# Patient Record
Sex: Male | Born: 1935 | Race: White | Hispanic: No | Marital: Married | Smoking: Former smoker
Health system: Southern US, Community
[De-identification: ages and names within clinical notes are randomized; demographics above are authoritative.]

## PROBLEM LIST (undated history)

## (undated) DIAGNOSIS — E119 Type 2 diabetes mellitus without complications: Secondary | ICD-10-CM

## (undated) DIAGNOSIS — K219 Gastro-esophageal reflux disease without esophagitis: Secondary | ICD-10-CM

## (undated) DIAGNOSIS — Z8711 Personal history of peptic ulcer disease: Secondary | ICD-10-CM

## (undated) DIAGNOSIS — I493 Ventricular premature depolarization: Secondary | ICD-10-CM

## (undated) DIAGNOSIS — C61 Malignant neoplasm of prostate: Secondary | ICD-10-CM

## (undated) DIAGNOSIS — I10 Essential (primary) hypertension: Secondary | ICD-10-CM

## (undated) DIAGNOSIS — R51 Headache: Secondary | ICD-10-CM

## (undated) DIAGNOSIS — I459 Conduction disorder, unspecified: Secondary | ICD-10-CM

## (undated) DIAGNOSIS — Z8719 Personal history of other diseases of the digestive system: Secondary | ICD-10-CM

## (undated) DIAGNOSIS — M6282 Rhabdomyolysis: Secondary | ICD-10-CM

## (undated) DIAGNOSIS — Z9289 Personal history of other medical treatment: Secondary | ICD-10-CM

## (undated) DIAGNOSIS — R519 Headache, unspecified: Secondary | ICD-10-CM

## (undated) DIAGNOSIS — I6529 Occlusion and stenosis of unspecified carotid artery: Secondary | ICD-10-CM

## (undated) DIAGNOSIS — N400 Enlarged prostate without lower urinary tract symptoms: Secondary | ICD-10-CM

## (undated) DIAGNOSIS — G4700 Insomnia, unspecified: Secondary | ICD-10-CM

## (undated) DIAGNOSIS — I739 Peripheral vascular disease, unspecified: Secondary | ICD-10-CM

## (undated) DIAGNOSIS — I639 Cerebral infarction, unspecified: Secondary | ICD-10-CM

## (undated) DIAGNOSIS — M1990 Unspecified osteoarthritis, unspecified site: Secondary | ICD-10-CM

## (undated) DIAGNOSIS — K859 Acute pancreatitis without necrosis or infection, unspecified: Secondary | ICD-10-CM

## (undated) HISTORY — PX: CATARACT EXTRACTION W/ INTRAOCULAR LENS  IMPLANT, BILATERAL: SHX1307

## (undated) HISTORY — DX: Essential (primary) hypertension: I10

## (undated) HISTORY — PX: ESOPHAGOGASTRODUODENOSCOPY (EGD) WITH ESOPHAGEAL DILATION: SHX5812

## (undated) HISTORY — PX: TONSILLECTOMY: SUR1361

## (undated) HISTORY — DX: Occlusion and stenosis of unspecified carotid artery: I65.29

---

## 1998-04-19 HISTORY — PX: PROSTATE BIOPSY: SHX241

## 1998-09-09 ENCOUNTER — Other Ambulatory Visit: Admission: RE | Admit: 1998-09-09 | Discharge: 1998-09-09 | Payer: Self-pay | Admitting: Urology

## 1998-12-31 ENCOUNTER — Other Ambulatory Visit: Admission: RE | Admit: 1998-12-31 | Discharge: 1998-12-31 | Payer: Self-pay | Admitting: Urology

## 1999-01-22 ENCOUNTER — Encounter: Admission: RE | Admit: 1999-01-22 | Discharge: 1999-04-22 | Payer: Self-pay | Admitting: Radiation Oncology

## 1999-03-20 HISTORY — PX: INSERTION PROSTATE RADIATION SEED: SUR718

## 1999-03-24 ENCOUNTER — Encounter: Payer: Self-pay | Admitting: Urology

## 1999-03-24 ENCOUNTER — Ambulatory Visit (HOSPITAL_BASED_OUTPATIENT_CLINIC_OR_DEPARTMENT_OTHER): Admission: RE | Admit: 1999-03-24 | Discharge: 1999-03-24 | Payer: Self-pay | Admitting: Urology

## 1999-05-04 ENCOUNTER — Encounter: Admission: RE | Admit: 1999-05-04 | Discharge: 1999-08-02 | Payer: Self-pay | Admitting: Radiation Oncology

## 2001-02-03 ENCOUNTER — Ambulatory Visit (HOSPITAL_COMMUNITY): Admission: RE | Admit: 2001-02-03 | Discharge: 2001-02-03 | Payer: Self-pay | Admitting: Gastroenterology

## 2001-02-03 ENCOUNTER — Encounter (INDEPENDENT_AMBULATORY_CARE_PROVIDER_SITE_OTHER): Payer: Self-pay | Admitting: *Deleted

## 2003-09-05 ENCOUNTER — Ambulatory Visit (HOSPITAL_COMMUNITY): Admission: RE | Admit: 2003-09-05 | Discharge: 2003-09-05 | Payer: Self-pay | Admitting: Internal Medicine

## 2003-10-18 ENCOUNTER — Ambulatory Visit (HOSPITAL_COMMUNITY): Admission: RE | Admit: 2003-10-18 | Discharge: 2003-10-18 | Payer: Self-pay | Admitting: Internal Medicine

## 2003-11-04 ENCOUNTER — Ambulatory Visit (HOSPITAL_COMMUNITY): Admission: RE | Admit: 2003-11-04 | Discharge: 2003-11-04 | Payer: Self-pay | Admitting: Internal Medicine

## 2008-12-16 ENCOUNTER — Inpatient Hospital Stay (HOSPITAL_COMMUNITY): Admission: EM | Admit: 2008-12-16 | Discharge: 2008-12-23 | Payer: Self-pay | Admitting: Emergency Medicine

## 2009-05-19 ENCOUNTER — Encounter: Admission: RE | Admit: 2009-05-19 | Discharge: 2009-05-19 | Payer: Self-pay | Admitting: Internal Medicine

## 2010-01-08 ENCOUNTER — Ambulatory Visit (HOSPITAL_COMMUNITY): Admission: RE | Admit: 2010-01-08 | Discharge: 2010-01-08 | Payer: Self-pay | Admitting: Gastroenterology

## 2010-05-22 ENCOUNTER — Other Ambulatory Visit: Payer: Self-pay | Admitting: Dermatology

## 2010-07-24 LAB — CBC
HCT: 39.3 % (ref 39.0–52.0)
HCT: 41 % (ref 39.0–52.0)
Hemoglobin: 13.4 g/dL (ref 13.0–17.0)
Hemoglobin: 14 g/dL (ref 13.0–17.0)
MCHC: 34.1 g/dL (ref 30.0–36.0)
MCHC: 34.2 g/dL (ref 30.0–36.0)
MCV: 94.1 fL (ref 78.0–100.0)
MCV: 94.5 fL (ref 78.0–100.0)
Platelets: 200 10*3/uL (ref 150–400)
Platelets: 212 10*3/uL (ref 150–400)
RBC: 4.16 MIL/uL — ABNORMAL LOW (ref 4.22–5.81)
RBC: 4.35 MIL/uL (ref 4.22–5.81)
RDW: 13.4 % (ref 11.5–15.5)
RDW: 13.5 % (ref 11.5–15.5)
WBC: 15.1 10*3/uL — ABNORMAL HIGH (ref 4.0–10.5)
WBC: 15.5 10*3/uL — ABNORMAL HIGH (ref 4.0–10.5)

## 2010-07-24 LAB — GLUCOSE, CAPILLARY
Glucose-Capillary: 102 mg/dL — ABNORMAL HIGH (ref 70–99)
Glucose-Capillary: 110 mg/dL — ABNORMAL HIGH (ref 70–99)
Glucose-Capillary: 124 mg/dL — ABNORMAL HIGH (ref 70–99)
Glucose-Capillary: 124 mg/dL — ABNORMAL HIGH (ref 70–99)
Glucose-Capillary: 125 mg/dL — ABNORMAL HIGH (ref 70–99)
Glucose-Capillary: 129 mg/dL — ABNORMAL HIGH (ref 70–99)
Glucose-Capillary: 133 mg/dL — ABNORMAL HIGH (ref 70–99)
Glucose-Capillary: 142 mg/dL — ABNORMAL HIGH (ref 70–99)
Glucose-Capillary: 144 mg/dL — ABNORMAL HIGH (ref 70–99)
Glucose-Capillary: 155 mg/dL — ABNORMAL HIGH (ref 70–99)
Glucose-Capillary: 159 mg/dL — ABNORMAL HIGH (ref 70–99)
Glucose-Capillary: 162 mg/dL — ABNORMAL HIGH (ref 70–99)
Glucose-Capillary: 173 mg/dL — ABNORMAL HIGH (ref 70–99)

## 2010-07-24 LAB — COMPREHENSIVE METABOLIC PANEL
ALT: 104 U/L — ABNORMAL HIGH (ref 0–53)
ALT: 149 U/L — ABNORMAL HIGH (ref 0–53)
ALT: 91 U/L — ABNORMAL HIGH (ref 0–53)
AST: 111 U/L — ABNORMAL HIGH (ref 0–37)
AST: 70 U/L — ABNORMAL HIGH (ref 0–37)
AST: 70 U/L — ABNORMAL HIGH (ref 0–37)
Albumin: 2.7 g/dL — ABNORMAL LOW (ref 3.5–5.2)
Albumin: 2.8 g/dL — ABNORMAL LOW (ref 3.5–5.2)
Albumin: 3 g/dL — ABNORMAL LOW (ref 3.5–5.2)
Alkaline Phosphatase: 105 U/L (ref 39–117)
Alkaline Phosphatase: 108 U/L (ref 39–117)
Alkaline Phosphatase: 93 U/L (ref 39–117)
BUN: 10 mg/dL (ref 6–23)
BUN: 11 mg/dL (ref 6–23)
BUN: 9 mg/dL (ref 6–23)
CO2: 24 mEq/L (ref 19–32)
CO2: 26 mEq/L (ref 19–32)
CO2: 29 mEq/L (ref 19–32)
Calcium: 8.2 mg/dL — ABNORMAL LOW (ref 8.4–10.5)
Calcium: 8.3 mg/dL — ABNORMAL LOW (ref 8.4–10.5)
Calcium: 8.5 mg/dL (ref 8.4–10.5)
Chloride: 102 mEq/L (ref 96–112)
Chloride: 103 mEq/L (ref 96–112)
Chloride: 105 mEq/L (ref 96–112)
Creatinine, Ser: 0.95 mg/dL (ref 0.4–1.5)
Creatinine, Ser: 0.98 mg/dL (ref 0.4–1.5)
Creatinine, Ser: 0.98 mg/dL (ref 0.4–1.5)
GFR calc Af Amer: >60 mL/min (ref >60)
GFR calc Af Amer: >60 mL/min (ref >60)
GFR calc Af Amer: >60 mL/min (ref >60)
GFR calc non Af Amer: >60 mL/min (ref >60)
GFR calc non Af Amer: >60 mL/min (ref >60)
GFR calc non Af Amer: >60 mL/min (ref >60)
Glucose, Bld: 102 mg/dL — ABNORMAL HIGH (ref 70–99)
Glucose, Bld: 109 mg/dL — ABNORMAL HIGH (ref 70–99)
Glucose, Bld: 124 mg/dL — ABNORMAL HIGH (ref 70–99)
Potassium: 3.4 mEq/L — ABNORMAL LOW (ref 3.5–5.1)
Potassium: 3.4 mEq/L — ABNORMAL LOW (ref 3.5–5.1)
Potassium: 4 mEq/L (ref 3.5–5.1)
Sodium: 133 mEq/L — ABNORMAL LOW (ref 135–145)
Sodium: 137 mEq/L (ref 135–145)
Sodium: 141 mEq/L (ref 135–145)
Total Bilirubin: 0.7 mg/dL (ref 0.3–1.2)
Total Bilirubin: 1.1 mg/dL (ref 0.3–1.2)
Total Bilirubin: 1.4 mg/dL — ABNORMAL HIGH (ref 0.3–1.2)
Total Protein: 6.4 g/dL (ref 6.0–8.3)
Total Protein: 6.6 g/dL (ref 6.0–8.3)
Total Protein: 6.7 g/dL (ref 6.0–8.3)

## 2010-07-24 LAB — COMPREHENSIVE METABOLIC PANEL WITH GFR
ALT: 89 U/L — ABNORMAL HIGH (ref 0–53)
AST: 57 U/L — ABNORMAL HIGH (ref 0–37)
Albumin: 2.7 g/dL — ABNORMAL LOW (ref 3.5–5.2)
Alkaline Phosphatase: 95 U/L (ref 39–117)
BUN: 8 mg/dL (ref 6–23)
CO2: 27 meq/L (ref 19–32)
Calcium: 8.2 mg/dL — ABNORMAL LOW (ref 8.4–10.5)
Chloride: 105 meq/L (ref 96–112)
Creatinine, Ser: 0.94 mg/dL (ref 0.4–1.5)
GFR calc non Af Amer: >60 mL/min
Glucose, Bld: 112 mg/dL — ABNORMAL HIGH (ref 70–99)
Potassium: 3.5 meq/L (ref 3.5–5.1)
Sodium: 139 meq/L (ref 135–145)
Total Bilirubin: 0.9 mg/dL (ref 0.3–1.2)
Total Protein: 6.7 g/dL (ref 6.0–8.3)

## 2010-07-24 LAB — LIPID PANEL
Cholesterol: 164 mg/dL (ref 0–200)
HDL: 32 mg/dL — ABNORMAL LOW (ref >39)
LDL Cholesterol: 116 mg/dL — ABNORMAL HIGH (ref 0–99)
Total CHOL/HDL Ratio: 5.1 RATIO
Triglycerides: 82 mg/dL (ref <150)
VLDL: 16 mg/dL (ref 0–40)

## 2010-07-24 LAB — AMYLASE
Amylase: 194 U/L — ABNORMAL HIGH (ref 27–131)
Amylase: 545 U/L — ABNORMAL HIGH (ref 27–131)

## 2010-07-24 LAB — LIPASE, BLOOD
Lipase: 137 U/L — ABNORMAL HIGH (ref 11–59)
Lipase: 145 U/L — ABNORMAL HIGH (ref 11–59)
Lipase: 162 U/L — ABNORMAL HIGH (ref 11–59)
Lipase: 347 U/L — ABNORMAL HIGH (ref 11–59)

## 2010-07-25 LAB — BASIC METABOLIC PANEL
BUN: 7 mg/dL (ref 6–23)
CO2: 26 mEq/L (ref 19–32)
CO2: 27 mEq/L (ref 19–32)
Calcium: 8.5 mg/dL (ref 8.4–10.5)
Chloride: 102 mEq/L (ref 96–112)
Creatinine, Ser: 0.99 mg/dL (ref 0.4–1.5)
GFR calc Af Amer: >60 mL/min (ref >60)
GFR calc Af Amer: >60 mL/min (ref >60)
GFR calc non Af Amer: >60 mL/min (ref >60)
Glucose, Bld: 135 mg/dL — ABNORMAL HIGH (ref 70–99)
Glucose, Bld: 190 mg/dL — ABNORMAL HIGH (ref 70–99)
Potassium: 3.8 mEq/L (ref 3.5–5.1)
Potassium: 3.9 mEq/L (ref 3.5–5.1)
Sodium: 139 mEq/L (ref 135–145)
Sodium: 139 mEq/L (ref 135–145)

## 2010-07-25 LAB — HEPATIC FUNCTION PANEL
ALT: 143 U/L — ABNORMAL HIGH (ref 0–53)
AST: 121 U/L — ABNORMAL HIGH (ref 0–37)
AST: 309 U/L — ABNORMAL HIGH (ref 0–37)
Albumin: 3.2 g/dL — ABNORMAL LOW (ref 3.5–5.2)
Albumin: 3.4 g/dL — ABNORMAL LOW (ref 3.5–5.2)
Alkaline Phosphatase: 83 U/L (ref 39–117)
Alkaline Phosphatase: 89 U/L (ref 39–117)
Bilirubin, Direct: 0.2 mg/dL (ref 0.0–0.3)
Bilirubin, Direct: 0.2 mg/dL (ref 0.0–0.3)
Indirect Bilirubin: 0.9 mg/dL (ref 0.3–0.9)
Total Bilirubin: 1.1 mg/dL (ref 0.3–1.2)
Total Bilirubin: 1.1 mg/dL (ref 0.3–1.2)
Total Protein: 6.5 g/dL (ref 6.0–8.3)

## 2010-07-25 LAB — CBC
HCT: 40.9 % (ref 39.0–52.0)
HCT: 43.3 % (ref 39.0–52.0)
Hemoglobin: 14.1 g/dL (ref 13.0–17.0)
Hemoglobin: 14.8 g/dL (ref 13.0–17.0)
MCHC: 34.1 g/dL (ref 30.0–36.0)
MCHC: 34.5 g/dL (ref 30.0–36.0)
MCV: 93.7 fL (ref 78.0–100.0)
Platelets: 231 10*3/uL (ref 150–400)
RBC: 4.37 MIL/uL (ref 4.22–5.81)
RBC: 4.62 MIL/uL (ref 4.22–5.81)
RDW: 13.4 % (ref 11.5–15.5)
RDW: 13.5 % (ref 11.5–15.5)
WBC: 15.5 10*3/uL — ABNORMAL HIGH (ref 4.0–10.5)

## 2010-07-25 LAB — APTT: aPTT: 30 seconds (ref 24–37)

## 2010-07-25 LAB — URINALYSIS, ROUTINE W REFLEX MICROSCOPIC
Bilirubin Urine: NEGATIVE
Glucose, UA: NEGATIVE mg/dL
Hgb urine dipstick: NEGATIVE
Specific Gravity, Urine: 1.027 (ref 1.005–1.030)
Urobilinogen, UA: 1 mg/dL (ref 0.0–1.0)
pH: 7.5 (ref 5.0–8.0)

## 2010-07-25 LAB — LIPID PANEL
Cholesterol: 175 mg/dL (ref 0–200)
HDL: 32 mg/dL — ABNORMAL LOW (ref >39)
LDL Cholesterol: 127 mg/dL — ABNORMAL HIGH (ref 0–99)
Total CHOL/HDL Ratio: 5.5 RATIO
Triglycerides: 78 mg/dL (ref <150)
VLDL: 16 mg/dL (ref 0–40)

## 2010-07-25 LAB — CK TOTAL AND CKMB (NOT AT ARMC)
CK, MB: 3.6 ng/mL (ref 0.3–4.0)
Relative Index: 2.5 (ref 0.0–2.5)
Total CK: 144 U/L (ref 7–232)

## 2010-07-25 LAB — HEMOGLOBIN A1C
Hgb A1c MFr Bld: 7.1 % — ABNORMAL HIGH (ref 4.6–6.1)
Mean Plasma Glucose: 157 mg/dL

## 2010-07-25 LAB — LIPASE, BLOOD
Lipase: 502 U/L — ABNORMAL HIGH (ref 11–59)
Lipase: >2000 U/L — ABNORMAL HIGH (ref 11–59)

## 2010-07-25 LAB — GLUCOSE, CAPILLARY
Glucose-Capillary: 114 mg/dL — ABNORMAL HIGH (ref 70–99)
Glucose-Capillary: 114 mg/dL — ABNORMAL HIGH (ref 70–99)
Glucose-Capillary: 122 mg/dL — ABNORMAL HIGH (ref 70–99)
Glucose-Capillary: 125 mg/dL — ABNORMAL HIGH (ref 70–99)
Glucose-Capillary: 132 mg/dL — ABNORMAL HIGH (ref 70–99)

## 2010-07-25 LAB — URINE CULTURE

## 2010-07-25 LAB — POCT CARDIAC MARKERS: Troponin i, poc: <0.05 ng/mL (ref 0.00–0.09)

## 2010-07-25 LAB — PROTIME-INR
INR: 1 (ref 0.00–1.49)
Prothrombin Time: 13.2 seconds (ref 11.6–15.2)

## 2010-07-25 LAB — TROPONIN I: Troponin I: 0.03 ng/mL (ref 0.00–0.06)

## 2010-09-01 NOTE — Consult Note (Signed)
NAME:  Zachary [name], BOOMHOWER NO.:  [id number]   MEDICAL RECORD NO.:  [id number]          PATIENT TYPE:  INP   LOCATION:  3736                         FACILITY:  [facility]   PHYSICIAN:  Shirley Friar, MDDATE OF BIRTH:  02/12/1922   DATE OF CONSULTATION:  DATE OF DISCHARGE:                                 CONSULTATION   INDICATIONS:  Pancreatitis, elevated LFTs.   HISTORY OF PRESENT ILLNESS:  Mr. Fuston is an 77 year old white male  with acute onset of epigastric pain that started last night at midnight  and has continued all day today.  Pain is severe and sharp and was of  sudden onset.  He denies any associated nausea and vomiting, although he  does report some clear mucus regurgitation.  An ultrasound was done,  which showed normal gallbladder without any gallstones.  His common bile  duct was measured at 8 mm.  The head and body of the pancreas was normal  on ultrasound and tail was obscured by gas.  His lipase was greater than  2000, AST 309, ALT 179, T. bili 1.1, alkaline phosphatase 89.   PAST MEDICAL HISTORY:  1. Type 2 diabetes.  2. History of CVA.  3. Gastroesophageal reflux disease.  4. Prostate cancer.  5. Hyperlipidemia.   MEDICATIONS:  1. Prilosec 20 mg p.o. daily.  2. Aspirin 81 mg p.o. daily.  3. Eye drops (unknown name).   ALLERGIES:  PENICILLIN, METFORMIN.   FAMILY HISTORY:  Noncontributory.   SOCIAL HISTORY:  Denies alcohol or tobacco, married.   REVIEW OF SYSTEMS:  Negative from GI standpoint except as stated above.   PHYSICAL EXAMINATION:  VITAL SIGNS:  Temperature 98, pulse 75, blood  pressure 180/75, O2 sats 95% on room air.  GENERAL:  Alert in no acute distress.  ABDOMEN:  Tender in the epigastric region with guarding, otherwise  nontender, soft, mild distention, positive bowel sounds.   LABORATORY DATA:  White blood count 11.1, hemoglobin 14.8, platelet  count 240.  T. bili 1.1, ALP 89, AST 309, ALT 179, lipase greater than  2000.   IMPRESSION:  An 77 year old white male presenting with severe epigastric  abdominal pain due to acute pancreatitis likely from gallstones.  Question choledocholithiasis versus passage of a common bile duct stone.  Abdominal CT scan recommended and pending.  1. Follow LFTs closely.  2. Supportive care.  3. N.p.o.  4. The patient may need ERCP if there is a stone noted in the common      bile duct or if LFTs continue to rise.      Shirley Friar, MD  Electronically Signed     VCS/MEDQ  D:  12/16/2008  T:  12/17/2008  Job:  [id number]   cc:   Theressa Millard, M.D.

## 2010-09-01 NOTE — H&P (Signed)
NAME:  Zachary [name], CEN NO.:  [id number]   MEDICAL RECORD NO.:  [id number]          PATIENT TYPE:  INP   LOCATION:  3736                         FACILITY:  [facility]   PHYSICIAN:  Zachary [name], MDDATE OF BIRTH:  [birth year]   DATE OF ADMISSION:  12/16/2008  DATE OF DISCHARGE:                              HISTORY & PHYSICAL   PRIMARY CARE PHYSICIAN:  Dr. Theressa Millard.   CHIEF COMPLAINT:  Epigastric pain.   HISTORY OF PRESENT ILLNESS:  Zachary [name] is an 77 year old white male  with history of type 2 diabetes, GERD, history of CVA, and prostate  cancer who reports sudden onset of severe epigastric pain at 12:00 a.m.  this morning.  The patient states pain was so severe he did not think  he would wake up this morning.  The patient denies any associated  fever, nausea, vomiting, diarrhea, chest pain, or shortness of breath.  The patient states pain is constant since this morning, wife is at  bedside upon admission evaluation.   PAST MEDICAL HISTORY:  1. History of prostate cancer, status post seed implant in 2005.  2. Type 2 diabetes diagnosed 2008.  3. GERD.  4. History of CVA.  5. Hypercholesterolemia.  6. Cervical radiculopathy.   MEDICATIONS:  1. Aspirin 81 mg p.o. daily.  2. Prilosec 20 mg p.o. daily.  3. Question name of eye drops taking daily for upcoming cataract      surgery.   ALLERGIES:  PENICILLIN causes rash, METFORMIN causes general malaise.   FAMILY HISTORY:  Reviewed and noncontributory to admit.   SOCIAL HISTORY:  The patient is married.  He is retired.  He has 2  children, 5 grandchildren.  No history of tobacco or EtOH use.   REVIEW OF SYSTEMS:  GENERAL:  Negative for fever and fatigue.  EYES:  Negative for blurred vision or diplopia.  EARS, NOSE, AND THROAT:  Negative for sore throat or lesions.  NECK:  Negative for stiffness or  swelling.  CARDIOVASCULAR:  Negative for chest pain, shortness of  breath, palpitations, or lower  extremity swelling.  RESPIRATORY:  Negative for cough or shortness of breath.  GI: Positive for epigastric  pain, negative for nausea, vomiting, diarrhea, constipation, melena, or  hematochezia.  GU:  Negative for hematuria or dysuria.  SKIN:  Negative  for rash or suspicious lesions.  NEUROLOGIC:  Negative for headache,  dizziness, or focal weakness.   PHYSICAL EXAM:  VITALS:  Blood pressure on arrival 220/86, down to  164/86; heart rate 60; respirations 21; temp 97.7; O2 sat is 94% on room  air.  GENERAL:  This is an elderly white male, awake and alert, in no acute  distress.  HEAD:  Normocephalic, atraumatic.  EYES:  Extraocular movements are intact.  No scleral icterus or  injection.  EARS, NOSE, AND THROAT:  Mucous membranes are moist without any  oropharyngeal lesions.  NECK:  Supple without thyromegaly or lymphadenopathy.  No JVD or carotid  bruits.  CARDIOVASCULAR:  S1 and S2, regular rate and rhythm.  No murmurs, rubs,  or gallops.  No lower extremity edema.  RESPIRATORY:  Lung sounds are  clear to auscultation bilaterally.  No wheezes, rales, or crackles.  No  increased work of breathing.  GI:  Abdomen is soft, positive bowel sounds.  Positive Murphy sign with  no appreciated masses or hepatosplenomegaly.  SKIN:  Without any suspicious rashes or lesions.  NEUROLOGIC:  The patient moves all extremities x4 without any motor or  sensory deficits on exam.  PSYCHOLOGICAL:  The patient is alert, oriented x3 with pleasant mood and  affect.   LABS AND RADIOLOGY:  The patient with mild leukocytosis with white cell  count of 11.1, platelet count 240, hemoglobin 14.8, hematocrit 43.3.  Sodium 139, potassium 3.8, BUN 9, creatinine 0.94.  Cardiac point-of-  care negative x1.  Total bili 1.1, alkaline phosphatase 89, AST 309, ALT  179, lipase greater than 2000.  Chest x-ray with no definite acute  process.  Abdominal ultrasound reveals no gallstones.  Common bile duct  upper limits  of normal to slightly enlarged.  Head and body of pancreas  within normal limits.  Tail is obscured by bowel gas.   EKG, normal sinus rhythm at 61 beats per minute with no ischemic changes  from November 2000.   IMPRESSION AND PLAN:  1. Epigastric pain with elevated transaminases, unclear etiology,      question gallstone pancreatitis versus primary pancreatitis versus      other given results of abdominal ultrasound suspicion for patient      recently passing stone with transient elevated lipase.  We will      admit the patient overnight to telemetry for pain control, we will      ask GI to evaluate, the patient may need surgery consult, if felt      gallbladder contributing to patient's pain, question whether or not      the patient needs a CT of the abdomen to further visualize the      pancreas, we will discuss this with Gastroenterology.  We will      check one set of cardiac enzymes to rule out cardiac etiology of      the patient's pain.  The patient is nontoxic appearing, therefore      we will hold antibiotic treatment at this time for GI consult.  2. Uncontrolled hypertension, likely exacerbated by the patient's      pain.  We will order for p.r.n. labetalol.  3. Type 2 diabetes.  The patient is off oral agent secondary to      intolerance.  We will order for sliding scale insulin while      inpatient, check hemoglobin A1c.  4. Gastroesophageal reflux disease.  We will order for IV PPI in      setting of epigastric pain.  5. History of cerebrovascular accident, stable.  6. Hypercholesterolemia.  No meds prior to arrival.  We will check      fasting lipid profile in a.m. to evaluate      triglycerides in setting of epigastric pain.  7. Prophylaxis.  We will order for SCDs, will avoid aspirin and      Lovenox therapy in case need for surgery.  8. The patient is a do not resuscitate.      Cordelia Pen, NP      Zachary Cowboy, MD  Electronically  Signed    LE/MEDQ  D:  12/16/2008  T:  12/17/2008  Job:  [id number]   cc:   Theressa Millard, M.D.

## 2010-09-04 NOTE — Op Note (Signed)
[facility]  Patient:    Zachary [name]                      MRN: [id number] Proc. Date: 03/24/99 Adm. Date:  [id number] Attending:  Nelma Rothman Iii                           Operative Report  DIAGNOSIS:  Adenocarcinoma of the prostate.  OPERATIVE PROCEDURE:  Transperineal implantation of iodine 125 seeds into the prostate and flexible cystourethroscopy.  SURGEON:  Lucrezia Starch. Ovidio Hanger, M.D.  ASSISTANT:  Wynn Banker, M.D.  ANESTHESIA:  General endotracheal.  ESTIMATED BLOOD LOSS:  15 cc.  TUBES:  A 16 French Foley.  COMPLICATIONS:  None.  SEEDS IMPLANTED:  A total of 106 seeds were implanted with 20 needles at 0.284 Ci per seed and Vicryl strands were utilized.  INDICATIONS FOR SURGERY:  Mr. Bartolomei is a very nice 77 year old white male, who presented originally with high-grade PIN bilaterally.  He was found to have a PSA of 10.81 and subsequently underwent transrectal ultrasound and biopsy of the prostate again which revealed a Gleason score 7 which was 3+4 adenocarcinoma from the left side of the prostate with atypia on the right side.  His metastatic work-up was negative.  He has considered all options and elected to proceed with a combination of external beam therapy and seed implantation.  He has completed his external beam therapy.  He has been properly simulated, properly informed and is now to undergo seed implantation.  DESCRIPTION OF PROCEDURE:  The patient was placed in the supine position. After proper general endotracheal anesthesia, he was placed in the dorsal lithotomy position, prepped and draped with Betadine in a sterile fashion.  A  16 French Foley catheter had been inserted and inflated with 10 cc of contrast solution. The fluoroscopy was placed and the Siemens transrectal ultrasound probe was placed n the [facility] stabilization device and a localized preplanned coordinates.  A reference plane  of 2 cm from the base was utilized for implantation.  Both electronic and physical grid were placed and needles were placed and unused coordinates to facilitate 3 point fixation and serial implantation was performed in the to the prostate.  A total of 106 seeds were implanted with 20 needles at 0.428 mCi per seed and Vicryl strands were utilized.  The seeds appeared to distribute well throughout the prostate gland, both fluoroscopically and by ultrasound. Following implantation the transrectal ultrasound probe was removed as was the ed rubber catheter which had been placed in the rectum to prevent flatus and the wound was dressed sterilely.  Static images were obtained fluoroscopically with and without the ultrasound probe in place.  The patient was placed in the supine position, the Foley catheter was removed and scanned for seeds and there were none within it, and flexible cystourethroscopy was performed with an Olympus flexible cystourethroscope.  There was moderate trilobar hypertrophy, grade 2 trabeculation was noted.  Efflux of clear urine was noted from the normally located bilateral  ureteral orifices and there were no seeds or Vicryl strands noted in the bladder or the urethra.  The flexible cystourethroscope was removed and a new 16 French catheter was inserted, drained and the patient was taken to the recovery room stable. DD:  03/24/99 TD:  03/25/99 Job: [id number]

## 2011-02-04 ENCOUNTER — Other Ambulatory Visit: Payer: Self-pay | Admitting: Physical Medicine and Rehabilitation

## 2011-02-04 DIAGNOSIS — M79601 Pain in right arm: Secondary | ICD-10-CM

## 2011-02-04 DIAGNOSIS — G5610 Other lesions of median nerve, unspecified upper limb: Secondary | ICD-10-CM

## 2011-02-09 ENCOUNTER — Inpatient Hospital Stay: Admission: RE | Admit: 2011-02-09 | Payer: Self-pay | Source: Ambulatory Visit

## 2011-02-16 ENCOUNTER — Ambulatory Visit
Admission: RE | Admit: 2011-02-16 | Discharge: 2011-02-16 | Disposition: A | Discharge status: Home / Self Care | Payer: Medicare Other | Source: Ambulatory Visit | Attending: Physical Medicine and Rehabilitation | Admitting: Physical Medicine and Rehabilitation

## 2011-02-16 DIAGNOSIS — G5610 Other lesions of median nerve, unspecified upper limb: Secondary | ICD-10-CM

## 2011-02-16 DIAGNOSIS — M79601 Pain in right arm: Secondary | ICD-10-CM

## 2011-04-21 DIAGNOSIS — H2000 Unspecified acute and subacute iridocyclitis: Secondary | ICD-10-CM | POA: Diagnosis not present

## 2011-04-21 DIAGNOSIS — Z961 Presence of intraocular lens: Secondary | ICD-10-CM | POA: Diagnosis not present

## 2011-04-21 DIAGNOSIS — H35379 Puckering of macula, unspecified eye: Secondary | ICD-10-CM | POA: Diagnosis not present

## 2011-04-30 DIAGNOSIS — I70219 Atherosclerosis of native arteries of extremities with intermittent claudication, unspecified extremity: Secondary | ICD-10-CM | POA: Diagnosis not present

## 2011-04-30 DIAGNOSIS — I739 Peripheral vascular disease, unspecified: Secondary | ICD-10-CM | POA: Diagnosis not present

## 2011-05-19 DIAGNOSIS — H35379 Puckering of macula, unspecified eye: Secondary | ICD-10-CM | POA: Diagnosis not present

## 2011-06-02 DIAGNOSIS — M25569 Pain in unspecified knee: Secondary | ICD-10-CM | POA: Diagnosis not present

## 2011-06-02 DIAGNOSIS — R5381 Other malaise: Secondary | ICD-10-CM | POA: Diagnosis not present

## 2011-06-02 DIAGNOSIS — I739 Peripheral vascular disease, unspecified: Secondary | ICD-10-CM | POA: Diagnosis not present

## 2011-06-02 DIAGNOSIS — E1159 Type 2 diabetes mellitus with other circulatory complications: Secondary | ICD-10-CM | POA: Diagnosis not present

## 2011-06-16 DIAGNOSIS — Z961 Presence of intraocular lens: Secondary | ICD-10-CM | POA: Diagnosis not present

## 2011-06-16 DIAGNOSIS — H35379 Puckering of macula, unspecified eye: Secondary | ICD-10-CM | POA: Diagnosis not present

## 2011-06-16 DIAGNOSIS — H2000 Unspecified acute and subacute iridocyclitis: Secondary | ICD-10-CM | POA: Diagnosis not present

## 2011-07-14 DIAGNOSIS — R079 Chest pain, unspecified: Secondary | ICD-10-CM | POA: Diagnosis not present

## 2011-07-15 ENCOUNTER — Inpatient Hospital Stay (HOSPITAL_COMMUNITY)
Admission: EM | Admit: 2011-07-15 | Discharge: 2011-07-19 | DRG: 418 | Disposition: A | Discharge status: Home / Self Care | Payer: Medicare Other | Source: Ambulatory Visit | Attending: Internal Medicine | Admitting: Internal Medicine

## 2011-07-15 ENCOUNTER — Emergency Department (HOSPITAL_COMMUNITY): Payer: Medicare Other

## 2011-07-15 ENCOUNTER — Inpatient Hospital Stay (HOSPITAL_COMMUNITY): Payer: Medicare Other

## 2011-07-15 ENCOUNTER — Other Ambulatory Visit: Payer: Self-pay

## 2011-07-15 ENCOUNTER — Encounter (HOSPITAL_COMMUNITY): Payer: Self-pay | Admitting: Internal Medicine

## 2011-07-15 DIAGNOSIS — I739 Peripheral vascular disease, unspecified: Secondary | ICD-10-CM | POA: Diagnosis not present

## 2011-07-15 DIAGNOSIS — G4700 Insomnia, unspecified: Secondary | ICD-10-CM | POA: Diagnosis present

## 2011-07-15 DIAGNOSIS — K8020 Calculus of gallbladder without cholecystitis without obstruction: Secondary | ICD-10-CM | POA: Diagnosis not present

## 2011-07-15 DIAGNOSIS — Z7982 Long term (current) use of aspirin: Secondary | ICD-10-CM

## 2011-07-15 DIAGNOSIS — K810 Acute cholecystitis: Secondary | ICD-10-CM | POA: Diagnosis present

## 2011-07-15 DIAGNOSIS — Z66 Do not resuscitate: Secondary | ICD-10-CM | POA: Diagnosis present

## 2011-07-15 DIAGNOSIS — R079 Chest pain, unspecified: Secondary | ICD-10-CM | POA: Diagnosis not present

## 2011-07-15 DIAGNOSIS — Z8546 Personal history of malignant neoplasm of prostate: Secondary | ICD-10-CM

## 2011-07-15 DIAGNOSIS — Z79899 Other long term (current) drug therapy: Secondary | ICD-10-CM

## 2011-07-15 DIAGNOSIS — D72829 Elevated white blood cell count, unspecified: Secondary | ICD-10-CM | POA: Diagnosis present

## 2011-07-15 DIAGNOSIS — D7289 Other specified disorders of white blood cells: Secondary | ICD-10-CM | POA: Diagnosis not present

## 2011-07-15 DIAGNOSIS — K8000 Calculus of gallbladder with acute cholecystitis without obstruction: Secondary | ICD-10-CM | POA: Diagnosis present

## 2011-07-15 DIAGNOSIS — R1013 Epigastric pain: Secondary | ICD-10-CM | POA: Diagnosis present

## 2011-07-15 DIAGNOSIS — R109 Unspecified abdominal pain: Secondary | ICD-10-CM | POA: Diagnosis not present

## 2011-07-15 DIAGNOSIS — I10 Essential (primary) hypertension: Secondary | ICD-10-CM | POA: Diagnosis present

## 2011-07-15 DIAGNOSIS — K8010 Calculus of gallbladder with chronic cholecystitis without obstruction: Secondary | ICD-10-CM | POA: Diagnosis not present

## 2011-07-15 DIAGNOSIS — Z87891 Personal history of nicotine dependence: Secondary | ICD-10-CM

## 2011-07-15 DIAGNOSIS — N400 Enlarged prostate without lower urinary tract symptoms: Secondary | ICD-10-CM | POA: Diagnosis not present

## 2011-07-15 DIAGNOSIS — R072 Precordial pain: Secondary | ICD-10-CM | POA: Diagnosis not present

## 2011-07-15 HISTORY — DX: Insomnia, unspecified: G47.00

## 2011-07-15 HISTORY — DX: Malignant neoplasm of prostate: C61

## 2011-07-15 HISTORY — DX: Acute pancreatitis without necrosis or infection, unspecified: K85.90

## 2011-07-15 HISTORY — DX: Peripheral vascular disease, unspecified: I73.9

## 2011-07-15 HISTORY — DX: Ventricular premature depolarization: I49.3

## 2011-07-15 HISTORY — DX: Benign prostatic hyperplasia without lower urinary tract symptoms: N40.0

## 2011-07-15 HISTORY — DX: Conduction disorder, unspecified: I45.9

## 2011-07-15 LAB — CBC
MCH: 31.5 pg (ref 26.0–34.0)
MCHC: 34.5 g/dL (ref 30.0–36.0)
MCV: 91.3 fL (ref 78.0–100.0)
MCV: 91.3 fL (ref 78.0–100.0)
Platelets: 265 10*3/uL (ref 150–400)
Platelets: 242 10*3/uL (ref 150–400)
RBC: 4.82 MIL/uL (ref 4.22–5.81)
RDW: 12.7 % (ref 11.5–15.5)
WBC: 17.8 10*3/uL — ABNORMAL HIGH (ref 4.0–10.5)

## 2011-07-15 LAB — COMPREHENSIVE METABOLIC PANEL
AST: 18 U/L (ref 0–37)
Alkaline Phosphatase: 65 U/L (ref 39–117)
BUN: 9 mg/dL (ref 6–23)
CO2: 26 mEq/L (ref 19–32)
Calcium: 9.2 mg/dL (ref 8.4–10.5)
Chloride: 101 mEq/L (ref 96–112)
Creatinine, Ser: 0.82 mg/dL (ref 0.50–1.35)
GFR calc Af Amer: 90 mL/min — ABNORMAL LOW (ref >90)
GFR calc Af Amer: 89 mL/min — ABNORMAL LOW (ref >90)
GFR calc non Af Amer: 77 mL/min — ABNORMAL LOW (ref >90)
GFR calc non Af Amer: 77 mL/min — ABNORMAL LOW (ref >90)
Glucose, Bld: 167 mg/dL — ABNORMAL HIGH (ref 70–99)
Glucose, Bld: 147 mg/dL — ABNORMAL HIGH (ref 70–99)
Potassium: 3.8 mEq/L (ref 3.5–5.1)
Total Bilirubin: 0.6 mg/dL (ref 0.3–1.2)
Total Protein: 7.4 g/dL (ref 6.0–8.3)

## 2011-07-15 LAB — POCT I-STAT, CHEM 8
BUN: 10 mg/dL (ref 6–23)
Chloride: 104 mEq/L (ref 96–112)
HCT: 45 % (ref 39.0–52.0)
Potassium: 3.8 mEq/L (ref 3.5–5.1)

## 2011-07-15 LAB — CARDIAC PANEL(CRET KIN+CKTOT+MB+TROPI)
CK, MB: 2.9 ng/mL (ref 0.3–4.0)
CK, MB: 2.6 ng/mL (ref 0.3–4.0)
Total CK: 168 U/L (ref 7–232)
Troponin I: <0.3 ng/mL (ref <0.30)
Troponin I: <0.3 ng/mL (ref <0.30)
Troponin I: <0.3 ng/mL (ref <0.30)

## 2011-07-15 LAB — MRSA PCR SCREENING: MRSA by PCR: NEGATIVE

## 2011-07-15 LAB — LIPASE, BLOOD: Lipase: 34 U/L (ref 11–59)

## 2011-07-15 LAB — POCT I-STAT TROPONIN I: Troponin i, poc: 0.01 ng/mL (ref 0.00–0.08)

## 2011-07-15 MED ORDER — ZOLPIDEM TARTRATE 5 MG PO TABS
5.0000 mg | ORAL_TABLET | Freq: Every evening | ORAL | Status: DC | PRN
Start: 2011-07-15 — End: 2011-07-19

## 2011-07-15 MED ORDER — ALUM & MAG HYDROXIDE-SIMETH 200-200-20 MG/5ML PO SUSP
30.0000 mL | Freq: Four times a day (QID) | ORAL | Status: DC | PRN
  Administered 2011-07-15: 30 mL via ORAL
  Filled 2011-07-15: qty 30

## 2011-07-15 MED ORDER — SODIUM CHLORIDE 0.9 % IV SOLN: 250.0000 mL | INTRAVENOUS | Status: DC | PRN

## 2011-07-15 MED ORDER — HYDROCORTISONE ACETATE 25 MG RE SUPP
25.0000 mg | Freq: Every day | RECTAL | Status: DC | PRN
  Filled 2011-07-15: qty 1

## 2011-07-15 MED ORDER — ACETAMINOPHEN 325 MG PO TABS
650.0000 mg | ORAL_TABLET | Freq: Four times a day (QID) | ORAL | Status: DC | PRN
  Administered 2011-07-18: 650 mg via ORAL
  Filled 2011-07-15: qty 2

## 2011-07-15 MED ORDER — ALBUTEROL SULFATE (5 MG/ML) 0.5% IN NEBU: 2.5000 mg | INHALATION_SOLUTION | RESPIRATORY_TRACT | Status: DC | PRN

## 2011-07-15 MED ORDER — ONDANSETRON HCL 4 MG/2ML IJ SOLN
4.0000 mg | Freq: Four times a day (QID) | INTRAMUSCULAR | Status: DC | PRN
  Filled 2011-07-15: qty 2

## 2011-07-15 MED ORDER — ONDANSETRON HCL 4 MG/2ML IJ SOLN
INTRAMUSCULAR | Status: AC
  Administered 2011-07-15: 4 mg via INTRAVENOUS
  Filled 2011-07-15: qty 2

## 2011-07-15 MED ORDER — SODIUM CHLORIDE 0.9 % IJ SOLN
3.0000 mL | Freq: Two times a day (BID) | INTRAMUSCULAR | Status: DC
  Administered 2011-07-15 – 2011-07-16 (×2): 3 mL via INTRAVENOUS

## 2011-07-15 MED ORDER — NITROGLYCERIN IN D5W 200-5 MCG/ML-% IV SOLN: 2.0000 ug/min | INTRAVENOUS | Status: DC

## 2011-07-15 MED ORDER — DOCUSATE SODIUM 100 MG PO CAPS
100.0000 mg | ORAL_CAPSULE | Freq: Two times a day (BID) | ORAL | Status: DC
  Administered 2011-07-15 – 2011-07-19 (×8): 100 mg via ORAL
  Filled 2011-07-15 (×10): qty 1

## 2011-07-15 MED ORDER — TAMSULOSIN HCL 0.4 MG PO CAPS
0.4000 mg | ORAL_CAPSULE | Freq: Every day | ORAL | Status: DC
  Administered 2011-07-15 – 2011-07-19 (×2): 0.4 mg via ORAL
  Filled 2011-07-15 (×5): qty 1

## 2011-07-15 MED ORDER — NITROGLYCERIN IN D5W 200-5 MCG/ML-% IV SOLN
5.0000 ug/min | INTRAVENOUS | Status: DC
  Administered 2011-07-15: 5 ug/min via INTRAVENOUS
  Filled 2011-07-15: qty 250

## 2011-07-15 MED ORDER — GUAIFENESIN-DM 100-10 MG/5ML PO SYRP: 5.0000 mL | ORAL_SOLUTION | ORAL | Status: DC | PRN

## 2011-07-15 MED ORDER — FLURAZEPAM HCL 30 MG PO CAPS: 30.0000 mg | ORAL_CAPSULE | Freq: Every evening | ORAL | Status: DC | PRN

## 2011-07-15 MED ORDER — ACETAMINOPHEN 650 MG RE SUPP: 650.0000 mg | Freq: Four times a day (QID) | RECTAL | Status: DC | PRN

## 2011-07-15 MED ORDER — HYDROCODONE-ACETAMINOPHEN 5-325 MG PO TABS
1.0000 | ORAL_TABLET | ORAL | Status: DC | PRN
  Administered 2011-07-18: 2 via ORAL
  Filled 2011-07-15: qty 2

## 2011-07-15 MED ORDER — PENTOXIFYLLINE ER 400 MG PO TBCR
400.0000 mg | EXTENDED_RELEASE_TABLET | Freq: Three times a day (TID) | ORAL | Status: DC
  Administered 2011-07-15 – 2011-07-16 (×5): 400 mg via ORAL
  Filled 2011-07-15 (×10): qty 1

## 2011-07-15 MED ORDER — PANTOPRAZOLE SODIUM 40 MG PO TBEC
40.0000 mg | DELAYED_RELEASE_TABLET | Freq: Every day | ORAL | Status: DC
  Administered 2011-07-16 – 2011-07-18 (×4): 40 mg via ORAL
  Filled 2011-07-15 (×5): qty 1

## 2011-07-15 MED ORDER — ASPIRIN EC 81 MG PO TBEC
81.0000 mg | DELAYED_RELEASE_TABLET | Freq: Every day | ORAL | Status: DC
  Administered 2011-07-15 – 2011-07-16 (×2): 81 mg via ORAL
  Filled 2011-07-15 (×3): qty 1

## 2011-07-15 MED ORDER — ONDANSETRON HCL 4 MG/2ML IJ SOLN
4.0000 mg | Freq: Once | INTRAMUSCULAR | Status: AC
  Administered 2011-07-15: 4 mg via INTRAVENOUS

## 2011-07-15 MED ORDER — SODIUM CHLORIDE 0.9 % IJ SOLN
3.0000 mL | Freq: Two times a day (BID) | INTRAMUSCULAR | Status: DC
  Administered 2011-07-15 – 2011-07-17 (×5): 3 mL via INTRAVENOUS

## 2011-07-15 MED ORDER — CILOSTAZOL 100 MG PO TABS
100.0000 mg | ORAL_TABLET | Freq: Two times a day (BID) | ORAL | Status: DC
  Administered 2011-07-15 – 2011-07-16 (×4): 100 mg via ORAL
  Filled 2011-07-15 (×6): qty 1

## 2011-07-15 MED ORDER — SODIUM CHLORIDE 0.9 % IV SOLN
250.0000 mL | INTRAVENOUS | Status: DC | PRN
  Administered 2011-07-15: 100 mL via INTRAVENOUS
  Administered 2011-07-16: 05:00:00 via INTRAVENOUS

## 2011-07-15 MED ORDER — ONDANSETRON HCL 4 MG PO TABS: 4.0000 mg | ORAL_TABLET | Freq: Four times a day (QID) | ORAL | Status: DC | PRN

## 2011-07-15 MED ORDER — SODIUM CHLORIDE 0.9 % IJ SOLN: 3.0000 mL | INTRAMUSCULAR | Status: DC | PRN

## 2011-07-15 NOTE — ED Notes (Signed)
Pt transported by [employer]

## 2011-07-15 NOTE — H&P (Signed)
PCP:   Darnelle Bos, MD, MD    Chief Complaint:   Epigastric pain  HPI: Zachary [name] is a 78 y.o. male   has a past medical history of Prostate cancer (2000) and PVD (peripheral vascular disease).   Presented with  Gradual onset of epigastric pain starting at 6 pm. Patient was at rest, ate dinner right before it started. associated with  Some nausea, no vomiting, no SOB. He have ahd some recent diarrhea but it went away.  Now the pain has improved dramaticaly. NO fever but occasional chills. He stil feels unwell. When he was first evaluated his SBP was in 210's now down to 170's on nitro gtt. Currently chest/abdominal pain free  Review of Systems:    Pertinent positives include: chills, fatigue, abdominal pain, nausea, vomiting, diarrhea,  Constitutional:  No weight loss, night sweats, Fevers,  weight loss  HEENT:  No headaches, Difficulty swallowing,Tooth/dental problems,Sore throat,  No sneezing, itching, ear ache, nasal congestion, post nasal drip,  Cardio-vascular:  No chest pain, Orthopnea, PND, anasarca, dizziness, palpitations.no Bilateral lower extremity swelling  GI:  No heartburn, indigestion,  change in bowel habits, loss of appetite, melena, blood in stool, hematoemesis Resp:  no shortness of breath at rest. No dyspnea on exertion, No excess mucus, no productive cough, No non-productive cough, No coughing up of blood.No change in color of mucus.No wheezing. Skin:  no rash or lesions. No jaundice GU:  no dysuria, change in color of urine, no urgency or frequency. No straining to urinate.  No flank pain.  Musculoskeletal:  No joint pain or no joint swelling. No decreased range of motion. No back pain.  Psych:  No change in mood or affect. No depression or anxiety. No memory loss.  Neuro: no localizing neurological complaints, no tingling, no weakness, no double vision, no gait abnormality, no slurred speech, no confusion  Otherwise ROS are negative  except for above, 10 systems were reviewed  Past Medical History: Past Medical History  Diagnosis Date  . Prostate cancer 2000  . PVD (peripheral vascular disease)   . Prostate enlargement   . Insomnia   . Pancreatitis   . PVC's (premature ventricular contractions)   . Heart block    History reviewed. No pertinent past surgical history.   Medications: Prior to Admission medications   Medication Sig Start Date End Date Taking? Authorizing Provider  aspirin EC 81 MG tablet Take 81 mg by mouth daily.   Yes Historical Provider, MD  cilostazol (PLETAL) 100 MG tablet Take 100 mg by mouth 2 (two) times daily.   Yes Historical Provider, MD  flurazepam (DALMANE) 30 MG capsule Take 30 mg by mouth at bedtime as needed. sleep   Yes Historical Provider, MD  hydrocortisone (ANUSOL-HC) 25 MG suppository Place 25 mg rectally daily as needed. hemorrhoids   Yes Historical Provider, MD  pentoxifylline (TRENTAL) 400 MG CR tablet Take 400 mg by mouth 3 (three) times daily with meals.   Yes Historical Provider, MD  Tamsulosin HCl (FLOMAX) 0.4 MG CAPS Take 0.4 mg by mouth.   Yes Historical Provider, MD    Allergies:   Allergies  Allergen Reactions  . Penicillins Rash    Social History:  Ambulatory  independently with occasional cane Lives at  home   reports that he has quit smoking. He does not have any smokeless tobacco history on file. He reports that he does not drink alcohol.   Family History: family history includes Breast cancer in his daughter.  Physical Exam: Patient Vitals for the past 24 hrs:  BP Temp Temp src Pulse Resp SpO2 Height Weight  07/15/11 0245 170/75 mmHg - - 95  20  91 % - -  07/15/11 0230 160/73 mmHg - - 97  19  91 % - -  07/15/11 0215 167/87 mmHg - - 91  20  94 % - -  07/15/11 0200 170/87 mmHg - - 93  19  95 % - -  07/15/11 0145 195/87 mmHg - - 92  22  94 % - -  07/15/11 0130 195/75 mmHg - - 89  22  95 % - -  07/15/11 0115 186/85 mmHg - - 88  20  94 % - -    07/15/11 0100 201/83 mmHg - - 82  14  96 % - -  07/15/11 0045 201/89 mmHg - - 81  14  96 % - -  07/15/11 0040 213/92 mmHg - - - 18  94 % - -  07/15/11 0015 206/89 mmHg 98.3 F (36.8 C) Oral - 20  93 % 5\' 10"  (1.778 m) 94.348 kg (208 lb)    1. General:  in No Acute distress 2. Psychological: Alert and  Oriented 3. Head/ENT:   Moist  Mucous Membranes                          Head Non traumatic, neck supple                          Normal  Dentition 4. SKIN: normal  Skin turgor,  Skin clean Dry and intact no rash 5. Heart: Regular rate and rhythm no Murmur, Rub or gallop 6. Lungs: Clear to auscultation bilaterally, no wheezes or crackles   7. Abdomen: Soft, mild epigastric tenderness, Non distended 8. Lower extremities: no clubbing, cyanosis, or edema, difficult to palpate pulses bilateraly 9. Neurologically Grossly intact, moving all 4 extremities equally 10. MSK: Normal range of motion  body mass index is 29.84 kg/(m^2).   Labs on Admission:   [facility] 07/15/11 0049 07/15/11 0025  NA 141 137  K 3.8 3.6  CL 104 100  CO2 -- 26  GLUCOSE 169* 167*  BUN 10 9  CREATININE 0.90 0.82  CALCIUM -- 9.2  MG -- --  PHOS -- --    Basename 07/15/11 0025  AST 18  ALT 13  ALKPHOS 76  BILITOT 0.3  PROT 7.4  ALBUMIN 3.7   No results found for this basename: LIPASE:2,AMYLASE:2 in the last 72 hours  Basename 07/15/11 0049 07/15/11 0025  WBC -- 13.6*  NEUTROABS -- --  HGB 15.3 15.2  HCT 45.0 44.0  MCV -- 91.3  PLT -- 265   No results found for this basename: CKTOTAL:3,CKMB:3,CKMBINDEX:3,TROPONINI:3 in the last 72 hours No results found for this basename: TSH,T4TOTAL,FREET3,T3FREE,THYROIDAB in the last 72 hours No results found for this basename: VITAMINB12:2,FOLATE:2,FERRITIN:2,TIBC:2,IRON:2,RETICCTPCT:2 in the last 72 hours Lab Results  Component Value Date   HGBA1C  Value: 7.1 (NOTE) The ADA recommends the following therapeutic goal for glycemic control related to Hgb A1c  measurement: Goal of therapy: <6.5 Hgb A1c  Reference: American Diabetes Association: Clinical Practice Recommendations 2010, Diabetes Care, 2010, 33: (Suppl  1).* 12/16/2008    Estimated Creatinine Clearance: 66.7 ml/min (by C-G formula based on Cr of 0.9). ABG    Component Value Date/Time   TCO2 26 07/15/2011 0049     No results  found for this basename: DDIMER     Other results:  I have pearsonaly reviewed this: ECG REPORT  Rate: 83   Rhythm: NSR w PVC's  ST&T Change: no ischemia  Cultures:    Component Value Date/Time   SDES URINE, RANDOM 12/17/2008 0353   SPECREQUEST NONE 12/17/2008 0353   CULT NO GROWTH 12/17/2008 0353   REPTSTATUS 12/18/2008 FINAL 12/17/2008 0353       Radiological Exams on Admission: Dg Chest Portable 1 View  07/15/2011  *RADIOLOGY REPORT*  Clinical Data: 78 year old male with chest pain.  PORTABLE CHEST - 1 VIEW  Comparison: 12/04/2010 and prior chest radiographs  Findings: Mild cardiomegaly is again noted. Left basilar atelectasis is now identified. There is no evidence of focal airspace disease, pulmonary edema, suspicious pulmonary nodule/mass, pleural effusion, or pneumothorax. No acute bony abnormalities are identified.  IMPRESSION: Mild cardiomegaly with left basilar atelectasis.  Original Report Authenticated By: Rosendo Gros, M.D.    Assessment/Plan  78 yo M with hx of PVD here with epigastric pain sudden onset associated with nausea and severe HTN  Present on Admission:  .Epigastric abdominal pain/Chest pain at rest - - given risk factors will admit, monitor on telemetry, cycle cardiac enzymes, obtain serial ECG. Further risk stratify with lipid panel, hgA1C, obtain TSH. Make sure patient is on Aspirin. Further treatment based on the currently pending results.  Await on lipase, consider cardiology consult .HTN (hypertension), malignant - continue nitro gtt, he may need to be started on antihypertensive  .Leukocytosis - repeat in AM .PVD  (peripheral vascular disease) - continue home mes    Prophylaxis:  Lovenox  CODE STATUS:DNR/DNI  I have spent a total of 65 min on this admition  Robb Sibal 07/15/2011, 2:42 AM

## 2011-07-15 NOTE — ED Notes (Signed)
Nursing Communication:   Gwenyth Bouillon: [phone] (Cell) - [facility] (Neighbor)  [phone] (Home)  Evans Levee: [phone] [facility])- Wife

## 2011-07-15 NOTE — ED Notes (Signed)
Per EMS, patient is having CP that starts in mid chest pain, staring below nipple. Does not radiate. No SOB or N/V. No hx of HTN, but tonight BP was 242/120.  12 lead shows 1st degree HB and 10-15 PVCS per minute. No hx of either. 2 L  O2 N/C. Last year had pancreatitis. Pain was this bad but does not feel the same.  Pt had 1 Nitro SL and helped. Also, 324 mg PO ASA.  20g R hand. Vitals 195/110, HR 78.

## 2011-07-15 NOTE — ED Provider Notes (Signed)
History     CSN: [id number]  Arrival date & time 07/15/11  0015   First MD Initiated Contact with Patient 07/15/11 0016      Chief Complaint  Patient presents with  . Chest Pain    (Consider location/radiation/quality/duration/timing/severity/associated sxs/prior treatment) Patient is a 78 y.o. male presenting with chest pain. The history is provided by the patient, the spouse and a relative.  Chest Pain The chest pain began 3 - 5 hours ago. Duration of episode(s) is 5 hours. Chest pain occurs constantly. The chest pain is improving. Associated with: Nothing. The pain is currently at 2/10. The severity of the pain is moderate. The quality of the pain is described as aching. The pain does not radiate. Exacerbated by: Nothing. Primary symptoms include nausea. Pertinent negatives for primary symptoms include no fever, no syncope, no shortness of breath, no cough, no abdominal pain, no vomiting and no altered mental status.  Pertinent negatives for associated symptoms include no diaphoresis.    at home tonight and while at rest developed substernal chest pain described as aching. He has associated nausea but no vomiting. No cough or recent illness. He has had on-and-off leg pains but no pain or swelling tonight. History of PE or DVT. No cardiac history. No history of similar symptoms in the past. He states this feels nothing like his previous GERD and heartburn. Received aspirin nitroglycerin route and states pain improving some but still present. Noted to be very hypertensive without history of same. Moderate severity.  No past medical history on file.  No past surgical history on file.  No family history on file.  History  Substance Use Topics  . Smoking status: Not on file  . Smokeless tobacco: Not on file  . Alcohol Use: Not on file      Review of Systems  Constitutional: Negative for fever, chills and diaphoresis.  HENT: Negative for neck pain and neck stiffness.   Eyes:  Negative for pain.  Respiratory: Negative for cough and shortness of breath.   Cardiovascular: Positive for chest pain. Negative for syncope.  Gastrointestinal: Positive for nausea. Negative for vomiting and abdominal pain.  Genitourinary: Negative for dysuria.  Musculoskeletal: Negative for back pain.  Skin: Negative for rash.  Neurological: Negative for headaches.  Psychiatric/Behavioral: Negative for altered mental status.  All other systems reviewed and are negative.    Allergies  Penicillins  Home Medications   Current Outpatient Rx  Name Route Sig Dispense Refill  . ASPIRIN EC 81 MG PO TBEC Oral Take 81 mg by mouth daily.    Marland Kitchen CILOSTAZOL 100 MG PO TABS Oral Take 100 mg by mouth 2 (two) times daily.    Marland Kitchen FLURAZEPAM HCL 30 MG PO CAPS Oral Take 30 mg by mouth at bedtime as needed. sleep    . HYDROCORTISONE ACETATE 25 MG RE SUPP Rectal Place 25 mg rectally daily as needed. hemorrhoids    . PENTOXIFYLLINE ER 400 MG PO TBCR Oral Take 400 mg by mouth 3 (three) times daily with meals.    . TAMSULOSIN HCL 0.4 MG PO CAPS Oral Take 0.4 mg by mouth.      BP 213/92  Temp(Src) 98.3 F (36.8 C) (Oral)  Resp 18  Ht 5\' 10"  (1.778 m)  Wt 208 lb (94.348 kg)  BMI 29.84 kg/m2  SpO2 94%  Physical Exam  Constitutional: He is oriented to person, place, and time. He appears well-developed and well-nourished.  HENT:  Head: Normocephalic and atraumatic.  Eyes: Conjunctivae  and EOM are normal. Pupils are equal, round, and reactive to light.  Neck: Trachea normal. Neck supple. No thyromegaly present.  Cardiovascular: Normal rate, regular rhythm, S1 normal, S2 normal and normal pulses.     No systolic murmur is present   No diastolic murmur is present  Pulses:      Radial pulses are 2+ on the right side, and 2+ on the left side.  Pulmonary/Chest: Effort normal and breath sounds normal. He has no wheezes. He has no rhonchi. He has no rales. He exhibits no tenderness.  Abdominal: Soft. Normal  appearance and bowel sounds are normal. There is no tenderness. There is no CVA tenderness and negative Murphy's sign.  Musculoskeletal:       BLE:s Calves nontender, no cords or erythema, negative Homans sign  Neurological: He is alert and oriented to person, place, and time. He has normal strength. No cranial nerve deficit or sensory deficit. GCS eye subscore is 4. GCS verbal subscore is 5. GCS motor subscore is 6.  Skin: Skin is warm and dry. No rash noted. He is not diaphoretic.  Psychiatric: His speech is normal.       Cooperative and appropriate    ED Course  Procedures (including critical care time)  Results for orders placed during the hospital encounter of 07/15/11  CBC      Component Value Range   WBC 13.6 (*) 4.0 - 10.5 (K/uL)   RBC 4.82  4.22 - 5.81 (MIL/uL)   Hemoglobin 15.2  13.0 - 17.0 (g/dL)   HCT 16.1  09.6 - 04.5 (%)   MCV 91.3  78.0 - 100.0 (fL)   MCH 31.5  26.0 - 34.0 (pg)   MCHC 34.5  30.0 - 36.0 (g/dL)   RDW 40.9  81.1 - 91.4 (%)   Platelets 265  150 - 400 (K/uL)  COMPREHENSIVE METABOLIC PANEL      Component Value Range   Sodium 137  135 - 145 (mEq/L)   Potassium 3.6  3.5 - 5.1 (mEq/L)   Chloride 100  96 - 112 (mEq/L)   CO2 26  19 - 32 (mEq/L)   Glucose, Bld 167 (*) 70 - 99 (mg/dL)   BUN 9  6 - 23 (mg/dL)   Creatinine, Ser 7.82  0.50 - 1.35 (mg/dL)   Calcium 9.2  8.4 - 95.6 (mg/dL)   Total Protein 7.4  6.0 - 8.3 (g/dL)   Albumin 3.7  3.5 - 5.2 (g/dL)   AST 18  0 - 37 (U/L)   ALT 13  0 - 53 (U/L)   Alkaline Phosphatase 76  39 - 117 (U/L)   Total Bilirubin 0.3  0.3 - 1.2 (mg/dL)   GFR calc non Af Amer 77 (*) >90 (mL/min)   GFR calc Af Amer 90 (*) >90 (mL/min)  POCT I-STAT, CHEM 8      Component Value Range   Sodium 141  135 - 145 (mEq/L)   Potassium 3.8  3.5 - 5.1 (mEq/L)   Chloride 104  96 - 112 (mEq/L)   BUN 10  6 - 23 (mg/dL)   Creatinine, Ser 2.13  0.50 - 1.35 (mg/dL)   Glucose, Bld 086 (*) 70 - 99 (mg/dL)   Calcium, Ion 5.78  4.69 - 1.32  (mmol/L)   TCO2 26  0 - 100 (mmol/L)   Hemoglobin 15.3  13.0 - 17.0 (g/dL)   HCT 62.9  52.8 - 41.3 (%)  POCT I-STAT TROPONIN I      Component Value  Range   Troponin i, poc 0.01  0.00 - 0.08 (ng/mL)   Comment 3            Dg Chest Portable 1 View  07/15/2011  *RADIOLOGY REPORT*  Clinical Data: 78 year old male with chest pain.  PORTABLE CHEST - 1 VIEW  Comparison: 12/04/2010 and prior chest radiographs  Findings: Mild cardiomegaly is again noted. Left basilar atelectasis is now identified. There is no evidence of focal airspace disease, pulmonary edema, suspicious pulmonary nodule/mass, pleural effusion, or pneumothorax. No acute bony abnormalities are identified.  IMPRESSION: Mild cardiomegaly with left basilar atelectasis.  Original Report Authenticated By: Rosendo Gros, M.D.    Date: 07/15/2011  Rate: 83   Rhythm: normal sinus rhythm  QRS Axis: normal  Intervals: normal  ST/T Wave abnormalities: nonspecific ST changes  Conduction Disutrbances:none  Narrative Interpretation:   Old EKG Reviewed: unchanged   CRITICAL CARE Performed by: Sanyiah Kanzler   Total critical care time: 40  Critical care time was exclusive of separately billable procedures and treating other patients.  Critical care was necessary to treat or prevent imminent or life-threatening deterioration.  Critical care was time spent personally by me on the following activities: development of treatment plan with patient and/or surrogate as well as nursing, discussions with consultants, evaluation of patient's response to treatment, examination of patient, obtaining history from patient or surrogate, ordering and performing treatments and interventions, ordering and review of laboratory studies, ordering and review of radiographic studies, pulse oximetry and re-evaluation of patient's condition. Family bedside provided additional history. Nitroglycerin IV drip initiated for blood pressure and chest pain. Medicine consult  for admit to step down. Labs, x-ray, EKG obtained and reviewed as above. Continued pain control measures. Repeat iv Zofran for ongoing nausea.  MDM  Chest pain in an 78 year old male with no previous history of heart disease. Concern for ACS. Put on nitro drip for elevated blood pressure and ongoing discomfort. EKG, chest x-ray and labs obtained and reviewed as above. Symptoms improving in the ED but still present. Medicine consult for admission.    1:53 AM case discussed as above with Dr Kara Pacer who agrees to admit step down ICU, NTG drip continued for CP and elevated BP.         Sunnie Nielsen, MD 07/15/11 [phone]

## 2011-07-15 NOTE — Progress Notes (Addendum)
Pts BP has decreased from SBP 170s-150s to 120s. Titrated down nitro gtt gradually down from to . Pt denies chest pain. Still dropping to 94/73 on Nitro gtt. Nitro gtt turned off. MD notified. Sander Remedios, Marzella Schlein

## 2011-07-15 NOTE — Progress Notes (Signed)
Subjective: Still with some nausea from time to time. Severe pain is resolved. Similar but not as sharp as pancreatitis pain he had a few years ago.   Objective:  Vital Signs: Filed Vitals:   07/15/11 0515 07/15/11 0530 07/15/11 0600 07/15/11 0630  BP: 145/70 134/62 132/69 114/63  Pulse: 106 109 112 108  Temp:      TempSrc:      Resp: 21 22 22 21   Height:      Weight:      SpO2: 92% 92% 90% 94%     EXAM: soft, minimal midepig tenderness   Intake/Output Summary (Last 24 hours) at 07/15/11 0750 Last data filed at 07/15/11 0600  Gross per 24 hour  Intake  39.58 ml  Output    300 ml  Net -260.42 ml    Lab Results:  [facility] 07/15/11 0049 07/15/11 0025  NA 141 137  K 3.8 3.6  CL 104 100  CO2 -- 26  GLUCOSE 169* 167*  BUN 10 9  CREATININE 0.90 0.82  CALCIUM -- 9.2  MG -- --  PHOS -- --    Basename 07/15/11 0025  AST 18  ALT 13  ALKPHOS 76  BILITOT 0.3  PROT 7.4  ALBUMIN 3.7    Basename 07/15/11 0256  LIPASE 34  AMYLASE --    Basename 07/15/11 0049 07/15/11 0025  WBC -- 13.6*  NEUTROABS -- --  HGB 15.3 15.2  HCT 45.0 44.0  MCV -- 91.3  PLT -- 265    Basename 07/15/11 0256  CKTOTAL 168  CKMB 4.6*  CKMBINDEX --  TROPONINI <0.30   No components found with this basename: POCBNP:3 No results found for this basename: DDIMER:2 in the last 72 hours No results found for this basename: HGBA1C:2 in the last 72 hours No results found for this basename: CHOL:2,HDL:2,LDLCALC:2,TRIG:2,CHOLHDL:2,LDLDIRECT:2 in the last 72 hours No results found for this basename: TSH,T4TOTAL,FREET3,T3FREE,THYROIDAB in the last 72 hours No results found for this basename: VITAMINB12:2,FOLATE:2,FERRITIN:2,TIBC:2,IRON:2,RETICCTPCT:2 in the last 72 hours  Studies/Results: Dg Chest Portable 1 View  07/15/2011  *RADIOLOGY REPORT*  Clinical Data: 78 year old male with chest pain.  PORTABLE CHEST - 1 VIEW  Comparison: 12/04/2010 and prior chest radiographs  Findings: Mild  cardiomegaly is again noted. Left basilar atelectasis is now identified. There is no evidence of focal airspace disease, pulmonary edema, suspicious pulmonary nodule/mass, pleural effusion, or pneumothorax. No acute bony abnormalities are identified.  IMPRESSION: Mild cardiomegaly with left basilar atelectasis.  Original Report Authenticated By: Rosendo Gros, M.D.   Medications: Scheduled Meds:   . aspirin EC  81 mg Oral Daily  . cilostazol  100 mg Oral BID  . docusate sodium  100 mg Oral BID  . ondansetron  4 mg Intravenous Once  . pentoxifylline  400 mg Oral TID WC  . sodium chloride  3 mL Intravenous Q12H  . sodium chloride  3 mL Intravenous Q12H  . Tamsulosin HCl  0.4 mg Oral Daily   Continuous Infusions:   . nitroGLYCERIN 30 mcg/min (07/15/11 0226)  . nitroGLYCERIN 15 mcg/min (07/15/11 0645)   PRN Meds:.sodium chloride, acetaminophen, acetaminophen, albuterol, alum & mag hydroxide-simeth, guaiFENesin-dextromethorphan, HYDROcodone-acetaminophen, hydrocortisone, ondansetron (ZOFRAN) IV, ondansetron, sodium chloride, zolpidem, DISCONTD: flurazepam  Assessment/Plan: Principal Problem:  *Epigastric abdominal pain - etiology unclear. Will keep in CCU today due to varying BP and pulse. Will not add betablocker yet but will increase IVF to see if that helps pulse. Check GB US today. May go off of telemetry for test.  Active Problems:  HTN (hypertension) - BP is better.   Tachycardia - this seems to be sinus tach. Will add some IVF>   Leukocytosis  PVD (peripheral vascular disease)   LOS: 0 days   OSBORNE,JAMES CHARLES 07/15/2011, 7:50 AM

## 2011-07-16 ENCOUNTER — Inpatient Hospital Stay (HOSPITAL_COMMUNITY): Payer: Medicare Other

## 2011-07-16 DIAGNOSIS — R109 Unspecified abdominal pain: Secondary | ICD-10-CM

## 2011-07-16 LAB — COMPREHENSIVE METABOLIC PANEL
ALT: 12 U/L (ref 0–53)
AST: 16 U/L (ref 0–37)
CO2: 28 mEq/L (ref 19–32)
Chloride: 105 mEq/L (ref 96–112)
Creatinine, Ser: 0.95 mg/dL (ref 0.50–1.35)
GFR calc non Af Amer: 73 mL/min — ABNORMAL LOW (ref >90)
Total Bilirubin: 0.7 mg/dL (ref 0.3–1.2)

## 2011-07-16 LAB — URINE CULTURE

## 2011-07-16 MED ORDER — LEVOFLOXACIN IN D5W 500 MG/100ML IV SOLN: 500.0000 mg | INTRAVENOUS | Status: DC

## 2011-07-16 MED ORDER — MORPHINE SULFATE 4 MG/ML IJ SOLN
3.7000 mg | Freq: Once | INTRAMUSCULAR | Status: AC
  Administered 2011-07-16: 3.7 mg via INTRAVENOUS

## 2011-07-16 MED ORDER — CIPROFLOXACIN IN D5W 400 MG/200ML IV SOLN
400.0000 mg | Freq: Two times a day (BID) | INTRAVENOUS | Status: DC
  Administered 2011-07-16 – 2011-07-19 (×8): 400 mg via INTRAVENOUS
  Filled 2011-07-16 (×8): qty 200

## 2011-07-16 MED ORDER — TECHNETIUM TC 99M MEBROFENIN IV KIT
5.0000 | PACK | Freq: Once | INTRAVENOUS | Status: AC | PRN
  Administered 2011-07-16: 5 via INTRAVENOUS

## 2011-07-16 MED ORDER — AMLODIPINE BESYLATE 2.5 MG PO TABS
2.5000 mg | ORAL_TABLET | Freq: Every day | ORAL | Status: DC
  Administered 2011-07-16 – 2011-07-19 (×4): 2.5 mg via ORAL
  Filled 2011-07-16 (×4): qty 1

## 2011-07-16 NOTE — Progress Notes (Signed)
Subjective: The patient states abdominal pain has resolved.  He states he has been passing gas but no bm.  Objective: Vital signs in last 24 hours: Temp:  [97.8 F (36.6 C)-98.8 F (37.1 C)] 98.1 F (36.7 C) (03/29 0751) Pulse Rate:  [76-110] 76  (03/29 0800) Resp:  [14-22] 18  (03/29 0800) BP: (128-173)/(67-95) 151/75 mmHg (03/29 0800) SpO2:  [91 %-96 %] 91 % (03/29 0800) Weight change:  Last BM Date: 07/14/11  Intake/Output from previous day: 03/28 0701 - 03/29 0700 In: 1686.7 [P.O.:970; I.V.:716.7] Out: 1850 [Urine:1850] Intake/Output this shift: Total I/O In: 560 [P.O.:360; I.V.:200] Out: -   Resp: clear to auscultation bilaterally Cardio: regular rate and rhythm, S1, S2 normal, no murmur, click, rub or gallop GI: soft, non-tender; bowel sounds normal; no masses,  no organomegaly  Lab Results:  Basename 07/15/11 0830 07/15/11 0049 07/15/11 0025  WBC 17.8* -- 13.6*  HGB 14.2 15.3 --  HCT 40.9 45.0 --  PLT 242 -- 265   BMET  Basename 07/15/11 0830 07/15/11 0049 07/15/11 0025  NA 138 141 --  K 3.8 3.8 --  CL 101 104 --  CO2 27 -- 26  GLUCOSE 147* 169* --  BUN 9 10 --  CREATININE 0.83 0.90 --  CALCIUM 9.0 -- 9.2    Studies/Results: US Abdomen Complete  07/15/2011  *RADIOLOGY REPORT*  Clinical Data:  Postprandial midepigastric pain  ABDOMINAL ULTRASOUND COMPLETE  Comparison:  December 16, 2008  Findings:  Gallbladder:  There is layering biliary sludge.  There is also gallbladder wall thickening and pericholecystic fluid.  Negative sonographic Murphy's sign.  Common Bile Duct:  Within normal limits in caliber. Measures 7 mm, unchanged.  Liver: No focal mass lesion identified.  Within normal limits in parenchymal echogenicity. There are several small simple cysts.  IVC:  Appears normal.  Pancreas:  No abnormality identified.  Spleen:  Within normal limits in size and echotexture.  Right kidney:  Normal in size and parenchymal echogenicity.  No evidence of mass or  hydronephrosis. There are multiple simple cysts.  Left kidney:  Normal in size and parenchymal echogenicity.  No evidence of mass or hydronephrosis. There are multiple simple cysts.  Abdominal Aorta:  No aneurysm identified. There is atherosclerotic calcification and irregularity.  IMPRESSION: There is sludge within the gallbladder lumen.  There is also gallbladder wall thickening and pericholecystic fluid which suggest cholecystitis.  Original Report Authenticated By: Brandon Melnick, M.D.   Dg Chest Portable 1 View  07/15/2011  *RADIOLOGY REPORT*  Clinical Data: 78 year old male with chest pain.  PORTABLE CHEST - 1 VIEW  Comparison: 12/04/2010 and prior chest radiographs  Findings: Mild cardiomegaly is again noted. Left basilar atelectasis is now identified. There is no evidence of focal airspace disease, pulmonary edema, suspicious pulmonary nodule/mass, pleural effusion, or pneumothorax. No acute bony abnormalities are identified.  IMPRESSION: Mild cardiomegaly with left basilar atelectasis.  Original Report Authenticated By: Rosendo Gros, M.D.    Medications: I have reviewed the patient's current medications.  Assessment/Plan:  The patients gallbladder does show sludge and pericholecystic fluid consistent with cholecystitis.  Interesting his abdominal pain has resolved and he feels much better but wbc elevated. I will ask surgery to see and have put in a call to their service  LOS: 1 day   [facility] 07/16/2011, 9:29 AM

## 2011-07-16 NOTE — Progress Notes (Signed)
07-16-11 UR completed. Hang Ammon RN BSN  

## 2011-07-16 NOTE — Consult Note (Signed)
Reason for Consult:Possible acute cholecystitis Referring Physician: [name] is an 78 y.[name]. male.  HPI: About 5 days of abdominal pain in the epigastrium only, no Murphy's sign, started after dinner on last Sunday.  + nausea, no vomiting, no fevers or chills, no jaundice.  History of pancreatitis without known etiology.  Past Medical History  Diagnosis Date  . Prostate cancer 2000  . PVD (peripheral vascular disease)   . Prostate enlargement   . Insomnia   . Pancreatitis   . PVC's (premature ventricular contractions)   . Heart block     History reviewed. No pertinent past surgical history.  Family History  Problem Relation Age of Onset  . Breast cancer Daughter     Social History:  reports that he has quit smoking. He does not have any smokeless tobacco history on file. He reports that he does not drink alcohol. His drug history not on file.  Allergies:  Allergies  Allergen Reactions  . Penicillins Rash    Medications: I have reviewed the patient's current medications.  Results for orders placed during the hospital encounter of 07/15/11 (from the past 48 hour(s))  CBC     Status: Abnormal   Collection Time   07/15/11 12:25 AM      Component Value Range Comment   WBC 13.6 (*) 4.0 - 10.5 (K/uL)    RBC 4.82  4.22 - 5.81 (MIL/uL)    Hemoglobin 15.2  13.0 - 17.0 (g/dL)    HCT 78.2  95.6 - 21.3 (%)    MCV 91.3  78.0 - 100.0 (fL)    MCH 31.5  26.0 - 34.0 (pg)    MCHC 34.5  30.0 - 36.0 (g/dL)    RDW 08.6  57.8 - 46.9 (%)    Platelets 265  150 - 400 (K/uL)   COMPREHENSIVE METABOLIC PANEL     Status: Abnormal   Collection Time   07/15/11 12:25 AM      Component Value Range Comment   Sodium 137  135 - 145 (mEq/L)    Potassium 3.6  3.5 - 5.1 (mEq/L)    Chloride 100  96 - 112 (mEq/L)    CO2 26  19 - 32 (mEq/L)    Glucose, Bld 167 (*) 70 - 99 (mg/dL)    BUN 9  6 - 23 (mg/dL)    Creatinine, Ser 6.29  0.50 - 1.35 (mg/dL)    Calcium 9.2  8.4 - 10.5 (mg/dL)      Total Protein 7.4  6.0 - 8.3 (g/dL)    Albumin 3.7  3.5 - 5.2 (g/dL)    AST 18  0 - 37 (U/L)    ALT 13  0 - 53 (U/L)    Alkaline Phosphatase 76  39 - 117 (U/L)    Total Bilirubin 0.3  0.3 - 1.2 (mg/dL)    GFR calc non Af Amer 77 (*) >90 (mL/min)    GFR calc Af Amer 90 (*) >90 (mL/min)   POCT I-STAT TROPONIN I     Status: Normal   Collection Time   07/15/11 12:48 AM      Component Value Range Comment   Troponin i, poc 0.01  0.00 - 0.08 (ng/mL)    Comment 3            POCT I-STAT, CHEM 8     Status: Abnormal   Collection Time   07/15/11 12:49 AM      Component Value Range Comment   Sodium  141  135 - 145 (mEq/L)    Potassium 3.8  3.5 - 5.1 (mEq/L)    Chloride 104  96 - 112 (mEq/L)    BUN 10  6 - 23 (mg/dL)    Creatinine, Ser 7.84  0.50 - 1.35 (mg/dL)    Glucose, Bld 696 (*) 70 - 99 (mg/dL)    Calcium, Ion 2.95  1.12 - 1.32 (mmol/L)    TCO2 26  0 - 100 (mmol/L)    Hemoglobin 15.3  13.0 - 17.0 (g/dL)    HCT 28.4  13.2 - 44.0 (%)   LIPASE, BLOOD     Status: Normal   Collection Time   07/15/11  2:56 AM      Component Value Range Comment   Lipase 34  11 - 59 (U/L)   CARDIAC PANEL(CRET KIN+CKTOT+MB+TROPI)     Status: Abnormal   Collection Time   07/15/11  2:56 AM      Component Value Range Comment   Total CK 168  7 - 232 (U/L)    CK, MB 4.6 (*) 0.3 - 4.0 (ng/mL)    Troponin I <0.30  <0.30 (ng/mL)    Relative Index 2.7 (*) 0.0 - 2.5    MRSA PCR SCREENING     Status: Normal   Collection Time   07/15/11  4:23 AM      Component Value Range Comment   MRSA by PCR NEGATIVE  NEGATIVE    MAGNESIUM     Status: Normal   Collection Time   07/15/11  8:30 AM      Component Value Range Comment   Magnesium 1.8  1.5 - 2.5 (mg/dL)   PHOSPHORUS     Status: Normal   Collection Time   07/15/11  8:30 AM      Component Value Range Comment   Phosphorus 3.4  2.3 - 4.6 (mg/dL)   TSH     Status: Normal   Collection Time   07/15/11  8:30 AM      Component Value Range Comment   TSH 1.772  0.350 -  4.500 (uIU/mL)   COMPREHENSIVE METABOLIC PANEL     Status: Abnormal   Collection Time   07/15/11  8:30 AM      Component Value Range Comment   Sodium 138  135 - 145 (mEq/L)    Potassium 3.8  3.5 - 5.1 (mEq/L)    Chloride 101  96 - 112 (mEq/L)    CO2 27  19 - 32 (mEq/L)    Glucose, Bld 147 (*) 70 - 99 (mg/dL)    BUN 9  6 - 23 (mg/dL)    Creatinine, Ser 1.02  0.50 - 1.35 (mg/dL)    Calcium 9.0  8.4 - 10.5 (mg/dL)    Total Protein 6.6  6.0 - 8.3 (g/dL)    Albumin 3.3 (*) 3.5 - 5.2 (g/dL)    AST 16  0 - 37 (U/L)    ALT 12  0 - 53 (U/L)    Alkaline Phosphatase 65  39 - 117 (U/L)    Total Bilirubin 0.6  0.3 - 1.2 (mg/dL)    GFR calc non Af Amer 77 (*) >90 (mL/min)    GFR calc Af Amer 89 (*) >90 (mL/min)   CBC     Status: Abnormal   Collection Time   07/15/11  8:30 AM      Component Value Range Comment   WBC 17.8 (*) 4.0 - 10.5 (K/uL)    RBC 4.48  4.22 -  5.81 (MIL/uL)    Hemoglobin 14.2  13.0 - 17.0 (g/dL)    HCT 16.1  09.6 - 04.5 (%)    MCV 91.3  78.0 - 100.0 (fL)    MCH 31.7  26.0 - 34.0 (pg)    MCHC 34.7  30.0 - 36.0 (g/dL)    RDW 40.9  81.1 - 91.4 (%)    Platelets 242  150 - 400 (K/uL)   CARDIAC PANEL(CRET KIN+CKTOT+MB+TROPI)     Status: Normal   Collection Time   07/15/11  8:30 AM      Component Value Range Comment   Total CK 115  7 - 232 (U/L)    CK, MB 2.9  0.3 - 4.0 (ng/mL)    Troponin I <0.30  <0.30 (ng/mL)    Relative Index 2.5  0.0 - 2.5    CARDIAC PANEL(CRET KIN+CKTOT+MB+TROPI)     Status: Normal   Collection Time   07/15/11  2:14 PM      Component Value Range Comment   Total CK 102  7 - 232 (U/L)    CK, MB 2.5  0.3 - 4.0 (ng/mL)    Troponin I <0.30  <0.30 (ng/mL)    Relative Index 2.5  0.0 - 2.5    CARDIAC PANEL(CRET KIN+CKTOT+MB+TROPI)     Status: Normal   Collection Time   07/15/11  9:28 PM      Component Value Range Comment   Total CK 133  7 - 232 (U/L) HEMOLYSIS AT THIS LEVEL MAY AFFECT RESULT   CK, MB 2.6  0.3 - 4.0 (ng/mL)    Troponin I <0.30  <0.30  (ng/mL)    Relative Index 2.0  0.0 - 2.5      US Abdomen Complete  07/15/2011  *RADIOLOGY REPORT*  Clinical Data:  Postprandial midepigastric pain  ABDOMINAL ULTRASOUND COMPLETE  Comparison:  December 16, 2008  Findings:  Gallbladder:  There is layering biliary sludge.  There is also gallbladder wall thickening and pericholecystic fluid.  Negative sonographic Murphy's sign.  Common Bile Duct:  Within normal limits in caliber. Measures 7 mm, unchanged.  Liver: No focal mass lesion identified.  Within normal limits in parenchymal echogenicity. There are several small simple cysts.  IVC:  Appears normal.  Pancreas:  No abnormality identified.  Spleen:  Within normal limits in size and echotexture.  Right kidney:  Normal in size and parenchymal echogenicity.  No evidence of mass or hydronephrosis. There are multiple simple cysts.  Left kidney:  Normal in size and parenchymal echogenicity.  No evidence of mass or hydronephrosis. There are multiple simple cysts.  Abdominal Aorta:  No aneurysm identified. There is atherosclerotic calcification and irregularity.  IMPRESSION: There is sludge within the gallbladder lumen.  There is also gallbladder wall thickening and pericholecystic fluid which suggest cholecystitis.  Original Report Authenticated By: Brandon Melnick, M.D.   Dg Chest Portable 1 View  07/15/2011  *RADIOLOGY REPORT*  Clinical Data: 78 year old male with chest pain.  PORTABLE CHEST - 1 VIEW  Comparison: 12/04/2010 and prior chest radiographs  Findings: Mild cardiomegaly is again noted. Left basilar atelectasis is now identified. There is no evidence of focal airspace disease, pulmonary edema, suspicious pulmonary nodule/mass, pleural effusion, or pneumothorax. No acute bony abnormalities are identified.  IMPRESSION: Mild cardiomegaly with left basilar atelectasis.  Original Report Authenticated By: Rosendo Gros, M.D.    Review of Systems  Constitutional: Negative for fever and chills.    Gastrointestinal: Positive for nausea and abdominal pain (epigastric only.).  Skin: Negative.    Blood pressure 162/75, pulse 92, temperature 98.1 F (36.7 C), temperature source Oral, resp. rate 20, height 5\' 10"  (1.778 m), weight 92.9 kg (204 lb 12.9 oz), SpO2 92.00%. Physical Exam  Constitutional: He is oriented to person, place, and time. He appears well-developed and well-nourished.  HENT:  Head: Normocephalic and atraumatic.  Eyes: Conjunctivae and EOM are normal. Pupils are equal, round, and reactive to light.  Neck: Normal range of motion. Neck supple.  Cardiovascular: Normal rate, regular rhythm and normal heart sounds.   Respiratory: Effort normal and breath sounds normal.  GI: Soft. Normal appearance and bowel sounds are normal. He exhibits no shifting dullness, no distension, no pulsatile liver, no abdominal bruit, no ascites, no pulsatile midline mass and no mass. There is no tenderness (no RUQ or epigastric tenderness). There is no rigidity, no guarding and negative Murphy's sign.  Musculoskeletal: Normal range of motion.  Neurological: He is alert and oriented to person, place, and time.  Skin: Skin is warm and dry.  Psychiatric: He has a normal mood and affect. His behavior is normal. Judgment and thought content normal.    Assessment/Plan: Possible acute cholecystitis, but concerns are that the patient has not RUQ pain or tenderness, had a negative sonographic Murphy's sign, and has normal LFTs, However, he has a history of pancreatitis of unknown etiology, and signs of acute cholecystitis on ultrasound.  With these conflicting signs would like to get a HIDA scan to see if the cystic duct is patent.  Will start on Cipro for presumed cholecystitis.  Will try to get HIDA today.  NPO for now.  Patient did have breakfast without symptoms.  Zachary [name],Zachary [name] 07/16/2011, 10:34 AM

## 2011-07-17 LAB — COMPREHENSIVE METABOLIC PANEL
ALT: 12 U/L (ref 0–53)
AST: 15 U/L (ref 0–37)
Albumin: 3.2 g/dL — ABNORMAL LOW (ref 3.5–5.2)
Alkaline Phosphatase: 57 U/L (ref 39–117)
Chloride: 102 mEq/L (ref 96–112)
Potassium: 3.5 mEq/L (ref 3.5–5.1)
Sodium: 138 mEq/L (ref 135–145)
Total Bilirubin: 0.5 mg/dL (ref 0.3–1.2)
Total Protein: 6.6 g/dL (ref 6.0–8.3)

## 2011-07-17 LAB — CBC
MCHC: 34.3 g/dL (ref 30.0–36.0)
Platelets: 258 10*3/uL (ref 150–400)
RDW: 12.6 % (ref 11.5–15.5)
WBC: 12.3 10*3/uL — ABNORMAL HIGH (ref 4.0–10.5)

## 2011-07-17 MED ORDER — BISACODYL 10 MG RE SUPP
10.0000 mg | RECTAL | Status: AC
  Administered 2011-07-17: 10 mg via RECTAL
  Filled 2011-07-17: qty 1

## 2011-07-17 NOTE — Progress Notes (Signed)
Subjective: Abd pain is much better, but has not eaten today. Was expecting to have surgery today, but needs to be off anti-platelet medications first. Will stop these and make npo for surgery possibly tomorrow Objective: Vital signs in last 24 hours: Temp:  [97.6 F (36.4 C)-98.9 F (37.2 C)] 98.3 F (36.8 C) (03/30 0800) Pulse Rate:  [82-86] 86  (03/30 0800) Resp:  [15-18] 18  (03/30 0800) BP: (140-176)/(54-90) 140/90 mmHg (03/30 0800) SpO2:  [92 %-97 %] 92 % (03/30 0408) Last BM Date: 07/14/11  Intake/Output from previous day: 03/29 0701 - 03/30 0700 In: 1320 [P.O.:720; I.V.:200; IV Piggyback:400] Out: -  Intake/Output this shift:    General appearance: alert, cooperative, appears stated age and no distress Resp: clear to auscultation bilaterally Cardio: regular rate and rhythm GI: soft, non-tender; bowel sounds normal; no masses,  no organomegaly  Lab Results:   Basename 07/17/11 0500 07/15/11 0830  WBC 12.3* 17.8*  HGB 13.8 14.2  HCT 40.2 40.9  PLT 258 242   BMET  Basename 07/17/11 0500 07/16/11 1005  NA 138 141  K 3.5 4.2  CL 102 105  CO2 26 28  GLUCOSE 143* 172*  BUN 12 12  CREATININE 0.89 0.95  CALCIUM 9.0 9.1   PT/INR No results found for this basename: LABPROT:2,INR:2 in the last 72 hours ABG No results found for this basename: PHART:2,PCO2:2,PO2:2,HCO3:2 in the last 72 hours  Studies/Results: Nm Hepatobiliary  07/16/2011  *RADIOLOGY REPORT*  Clinical Data:  Abdominal pain and evaluate for cholecystitis.  NUCLEAR MEDICINE HEPATOBILIARY IMAGING  Technique:  Sequential images of the abdomen were obtained out to 60 minutes following intravenous administration of radiopharmaceutical. 3.7 mg of morphine was given at 60 minutes. Additional images were obtained up to 90 minutes after radiopharmaceutical injection.  Radiopharmaceutical:  Tc-51m Choletec  Comparison:  Ultrasound 07/15/2011 and abdominal CT 12/16/2008  Findings: Radiopharmaceutical was  taken up by the liver and excreted to the biliary system and small bowel.  There appears be some accumulation of the radiopharmaceutical in the duodenal bulb. There is no clear uptake within the gallbladder even after the administration of morphine.  IMPRESSION: No evidence for gallbladder filling.  Findings are suggestive for an occluded cystic duct and concerning for cholecystitis.  Original Report Authenticated By: Richarda Overlie, M.D.   US Abdomen Complete  07/15/2011  *RADIOLOGY REPORT*  Clinical Data:  Postprandial midepigastric pain  ABDOMINAL ULTRASOUND COMPLETE  Comparison:  December 16, 2008  Findings:  Gallbladder:  There is layering biliary sludge.  There is also gallbladder wall thickening and pericholecystic fluid.  Negative sonographic Murphy's sign.  Common Bile Duct:  Within normal limits in caliber. Measures 7 mm, unchanged.  Liver: No focal mass lesion identified.  Within normal limits in parenchymal echogenicity. There are several small simple cysts.  IVC:  Appears normal.  Pancreas:  No abnormality identified.  Spleen:  Within normal limits in size and echotexture.  Right kidney:  Normal in size and parenchymal echogenicity.  No evidence of mass or hydronephrosis. There are multiple simple cysts.  Left kidney:  Normal in size and parenchymal echogenicity.  No evidence of mass or hydronephrosis. There are multiple simple cysts.  Abdominal Aorta:  No aneurysm identified. There is atherosclerotic calcification and irregularity.  IMPRESSION: There is sludge within the gallbladder lumen.  There is also gallbladder wall thickening and pericholecystic fluid which suggest cholecystitis.  Original Report Authenticated By: Brandon Melnick, M.D.    Anti-infectives: Anti-infectives     Start  Dose/Rate Route Frequency Ordered Stop   07/16/11 1100   ciprofloxacin (CIPRO) IVPB 400 mg        400 mg 200 mL/hr over 60 Minutes Intravenous Every 12 hours 07/16/11 1033     07/16/11 1100   levofloxacin  (LEVAQUIN) IVPB 500 mg  Status:  Discontinued        500 mg 100 mL/hr over 60 Minutes Intravenous Every 24 hours 07/16/11 1045 07/16/11 1048          Assessment/Plan: Acute cholecystitis-  Will allow pt to eat today and plan surgery tomorrow after antiplatelet medicattions have been off for a day. Continue Cipro  LOS: 2 days   Dewey Neukam,PA-C Pager [phone] General Trauma Pager [phone]

## 2011-07-17 NOTE — Progress Notes (Signed)
Subjective: Mr. Ducharme is feeling well. HIDA scan yesterday revealed an occluded cystic duct. He is going for cholecystectomy today. No complaints. Blood pressure relatively well controlled  Objective: Weight change:   Intake/Output Summary (Last 24 hours) at 07/17/11 0804 Last data filed at 07/16/11 2300  Gross per 24 hour  Intake   1220 ml  Output      0 ml  Net   1220 ml   Filed Vitals:   07/16/11 2310 07/16/11 2333 07/17/11 0340 07/17/11 0408  BP: 171/74  149/54   Pulse:      Temp:  98.9 F (37.2 C)  98.2 F (36.8 C)  TempSrc:  Oral  Oral  Resp:      Height:      Weight:      SpO2: 93%   92%    General Appearance: Alert, cooperative, no distress, appears stated age Head: Normocephalic, without obvious abnormality, atraumatic Neck: Supple, symmetrical Lungs: Clear to auscultation bilaterally, respirations unlabored Heart: Regular rate and rhythm, S1 and S2 normal, no murmur, rub or gallop Abdomen: Soft, non-tender, bowel sounds active all four quadrants, no masses, no organomegaly Extremities: Extremities normal, atraumatic, no cyanosis or edema Pulses: 2+ and symmetric all extremities Skin: Skin color, texture, turgor normal, no rashes or lesions Neuro: CNII-XII intact. Nonfocal   Lab Results:  Basename 07/17/11 0500 07/16/11 1005 07/15/11 0830  NA 138 141 --  K 3.5 4.2 --  CL 102 105 --  CO2 26 28 --  GLUCOSE 143* 172* --  BUN 12 12 --  CREATININE 0.89 0.95 --  CALCIUM 9.0 9.1 --  MG -- -- 1.8  PHOS -- -- 3.4    Basename 07/17/11 0500 07/16/11 1005  AST 15 16  ALT 12 12  ALKPHOS 57 61  BILITOT 0.5 0.7  PROT 6.6 6.9  ALBUMIN 3.2* 3.2*    Basename 07/15/11 0256  LIPASE 34  AMYLASE --    Basename 07/17/11 0500 07/15/11 0830  WBC 12.3* 17.8*  NEUTROABS -- --  HGB 13.8 14.2  HCT 40.2 40.9  MCV 92.2 91.3  PLT 258 242    Basename 07/15/11 2128 07/15/11 1414 07/15/11 0830  CKTOTAL 133 102 115  CKMB 2.6 2.5 2.9  CKMBINDEX -- -- --    TROPONINI <0.30 <0.30 <0.30   No components found with this basename: POCBNP:3 No results found for this basename: DDIMER:2 in the last 72 hours No results found for this basename: HGBA1C:2 in the last 72 hours No results found for this basename: CHOL:2,HDL:2,LDLCALC:2,TRIG:2,CHOLHDL:2,LDLDIRECT:2 in the last 72 hours  Basename 07/15/11 0830  TSH 1.772  T4TOTAL --  T3FREE --  THYROIDAB --   No results found for this basename: VITAMINB12:2,FOLATE:2,FERRITIN:2,TIBC:2,IRON:2,RETICCTPCT:2 in the last 72 hours  Studies/Results: Nm Hepatobiliary  07/16/2011  *RADIOLOGY REPORT*  Clinical Data:  Abdominal pain and evaluate for cholecystitis.  NUCLEAR MEDICINE HEPATOBILIARY IMAGING  Technique:  Sequential images of the abdomen were obtained out to 60 minutes following intravenous administration of radiopharmaceutical. 3.7 mg of morphine was given at 60 minutes. Additional images were obtained up to 90 minutes after radiopharmaceutical injection.  Radiopharmaceutical:  Tc-13m Choletec  Comparison:  Ultrasound 07/15/2011 and abdominal CT 12/16/2008  Findings: Radiopharmaceutical was taken up by the liver and excreted to the biliary system and small bowel.  There appears be some accumulation of the radiopharmaceutical in the duodenal bulb. There is no clear uptake within the gallbladder even after the administration of morphine.  IMPRESSION: No evidence for gallbladder filling.  Findings are suggestive for an occluded cystic duct and concerning for cholecystitis.  Original Report Authenticated By: Richarda Overlie, M.D.   US Abdomen Complete  07/15/2011  *RADIOLOGY REPORT*  Clinical Data:  Postprandial midepigastric pain  ABDOMINAL ULTRASOUND COMPLETE  Comparison:  December 16, 2008  Findings:  Gallbladder:  There is layering biliary sludge.  There is also gallbladder wall thickening and pericholecystic fluid.  Negative sonographic Murphy's sign.  Common Bile Duct:  Within normal limits in caliber. Measures 7  mm, unchanged.  Liver: No focal mass lesion identified.  Within normal limits in parenchymal echogenicity. There are several small simple cysts.  IVC:  Appears normal.  Pancreas:  No abnormality identified.  Spleen:  Within normal limits in size and echotexture.  Right kidney:  Normal in size and parenchymal echogenicity.  No evidence of mass or hydronephrosis. There are multiple simple cysts.  Left kidney:  Normal in size and parenchymal echogenicity.  No evidence of mass or hydronephrosis. There are multiple simple cysts.  Abdominal Aorta:  No aneurysm identified. There is atherosclerotic calcification and irregularity.  IMPRESSION: There is sludge within the gallbladder lumen.  There is also gallbladder wall thickening and pericholecystic fluid which suggest cholecystitis.  Original Report Authenticated By: Brandon Melnick, M.D.   Medications: Scheduled Meds:   . amLODipine  2.5 mg Oral Daily  . aspirin EC  81 mg Oral Daily  . cilostazol  100 mg Oral BID  . ciprofloxacin  400 mg Intravenous Q12H  . docusate sodium  100 mg Oral BID  .  morphine injection  3.7 mg Intravenous Once  . pantoprazole  40 mg Oral Q1200  . pentoxifylline  400 mg Oral TID WC  . sodium chloride  3 mL Intravenous Q12H  . Tamsulosin HCl  0.4 mg Oral Daily  . DISCONTD: levofloxacin (LEVAQUIN) IV  500 mg Intravenous Q24H  . DISCONTD: sodium chloride  3 mL Intravenous Q12H   Continuous Infusions:   . nitroGLYCERIN Stopped (07/15/11 0800)  . DISCONTD: nitroGLYCERIN 30 mcg/min (07/15/11 0226)   PRN Meds:.sodium chloride, acetaminophen, acetaminophen, albuterol, alum & mag hydroxide-simeth, guaiFENesin-dextromethorphan, HYDROcodone-acetaminophen, hydrocortisone, ondansetron (ZOFRAN) IV, ondansetron, technetium TC 80M mebrofenin, zolpidem, DISCONTD: sodium chloride  Assessment/Plan:  Patient Active Problem List  Diagnoses Date Noted  . Hypertension - systolic borderline elevated, but okay  07/15/2011  . Leukocytosis -  minimally elevated  07/15/2011  . Epigastric abdominal pain - as occluded cystic duct - for cholecystectomy today as per surgery  07/15/2011  . PVD (peripheral vascular disease)      LOS: 2 days   Samik Balkcom NEVILL 07/17/2011, 8:04 AM

## 2011-07-18 ENCOUNTER — Encounter (HOSPITAL_COMMUNITY): Payer: Self-pay | Admitting: Anesthesiology

## 2011-07-18 ENCOUNTER — Encounter (HOSPITAL_COMMUNITY)
Admission: EM | Disposition: A | Discharge status: Home / Self Care | Payer: Self-pay | Source: Ambulatory Visit | Attending: Internal Medicine

## 2011-07-18 ENCOUNTER — Inpatient Hospital Stay (HOSPITAL_COMMUNITY): Payer: Medicare Other | Admitting: Anesthesiology

## 2011-07-18 DIAGNOSIS — K8010 Calculus of gallbladder with chronic cholecystitis without obstruction: Secondary | ICD-10-CM

## 2011-07-18 HISTORY — PX: CHOLECYSTECTOMY: SHX55

## 2011-07-18 SURGERY — LAPAROSCOPIC CHOLECYSTECTOMY
Anesthesia: General | Site: Abdomen | Wound class: Clean Contaminated

## 2011-07-18 MED ORDER — LABETALOL HCL 5 MG/ML IV SOLN
INTRAVENOUS | Status: DC | PRN
  Administered 2011-07-18: 5 mg via INTRAVENOUS

## 2011-07-18 MED ORDER — SODIUM CHLORIDE 0.9 % IV SOLN: 250.0000 mL | INTRAVENOUS | Status: DC | PRN

## 2011-07-18 MED ORDER — PROPOFOL 10 MG/ML IV EMUL
INTRAVENOUS | Status: DC | PRN
  Administered 2011-07-18: 20 mg via INTRAVENOUS
  Administered 2011-07-18: 130 mg via INTRAVENOUS

## 2011-07-18 MED ORDER — PHENYLEPHRINE HCL 10 MG/ML IJ SOLN
INTRAMUSCULAR | Status: DC | PRN
  Administered 2011-07-18 (×2): 40 ug via INTRAVENOUS

## 2011-07-18 MED ORDER — EPHEDRINE SULFATE 50 MG/ML IJ SOLN
INTRAMUSCULAR | Status: DC | PRN
  Administered 2011-07-18: 10 mg via INTRAVENOUS
  Administered 2011-07-18: 5 mg via INTRAVENOUS

## 2011-07-18 MED ORDER — ONDANSETRON HCL 4 MG/2ML IJ SOLN: 4.0000 mg | Freq: Once | INTRAMUSCULAR | Status: AC | PRN

## 2011-07-18 MED ORDER — LIDOCAINE HCL (CARDIAC) 20 MG/ML IV SOLN
INTRAVENOUS | Status: DC | PRN
  Administered 2011-07-18: 35 mg via INTRAVENOUS

## 2011-07-18 MED ORDER — HYDROMORPHONE HCL PF 1 MG/ML IJ SOLN: 0.2500 mg | INTRAMUSCULAR | Status: DC | PRN

## 2011-07-18 MED ORDER — ROCURONIUM BROMIDE 100 MG/10ML IV SOLN
INTRAVENOUS | Status: DC | PRN
  Administered 2011-07-18: 50 mg via INTRAVENOUS

## 2011-07-18 MED ORDER — ONDANSETRON HCL 4 MG/2ML IJ SOLN
INTRAMUSCULAR | Status: DC | PRN
  Administered 2011-07-18: 4 mg via INTRAVENOUS

## 2011-07-18 MED ORDER — GLYCOPYRROLATE 0.2 MG/ML IJ SOLN
INTRAMUSCULAR | Status: DC | PRN
  Administered 2011-07-18: 0.2 mg via INTRAVENOUS
  Administered 2011-07-18: .6 mg via INTRAVENOUS

## 2011-07-18 MED ORDER — ONDANSETRON HCL 4 MG/2ML IJ SOLN
4.0000 mg | Freq: Once | INTRAMUSCULAR | Status: AC | PRN
  Administered 2011-07-18: 4 mg via INTRAVENOUS

## 2011-07-18 MED ORDER — NEOSTIGMINE METHYLSULFATE 1 MG/ML IJ SOLN
INTRAMUSCULAR | Status: DC | PRN
  Administered 2011-07-18: 4 mg via INTRAVENOUS

## 2011-07-18 MED ORDER — SODIUM CHLORIDE 0.9 % IR SOLN
Status: DC | PRN
  Administered 2011-07-18: 500 mL

## 2011-07-18 MED ORDER — BUPIVACAINE-EPINEPHRINE 0.25% -1:200000 IJ SOLN
INTRAMUSCULAR | Status: DC | PRN
  Administered 2011-07-18: 20 mL

## 2011-07-18 MED ORDER — LACTATED RINGERS IV SOLN
INTRAVENOUS | Status: DC | PRN
  Administered 2011-07-18: 10:00:00 via INTRAVENOUS

## 2011-07-18 MED ORDER — FENTANYL CITRATE 0.05 MG/ML IJ SOLN
INTRAMUSCULAR | Status: DC | PRN
  Administered 2011-07-18: 100 ug via INTRAVENOUS
  Administered 2011-07-18: 50 ug via INTRAVENOUS

## 2011-07-18 SURGICAL SUPPLY — 43 items
APPLIER CLIP 5 13 M/L LIGAMAX5 (MISCELLANEOUS) ×3
APPLIER CLIP ROT 10 11.4 M/L (STAPLE)
BLADE SURG ROTATE 9660 (MISCELLANEOUS) IMPLANT
CANISTER SUCTION 2500CC (MISCELLANEOUS) ×3 IMPLANT
CHLORAPREP W/TINT 26ML (MISCELLANEOUS) ×3 IMPLANT
CLIP APPLIE 5 13 M/L LIGAMAX5 (MISCELLANEOUS) ×2 IMPLANT
CLIP APPLIE ROT 10 11.4 M/L (STAPLE) IMPLANT
CLOTH BEACON ORANGE TIMEOUT ST (SAFETY) ×3 IMPLANT
CLSR STERI-STRIP ANTIMIC 1/2X4 (GAUZE/BANDAGES/DRESSINGS) ×3 IMPLANT
COVER MAYO STAND STRL (DRAPES) IMPLANT
COVER SURGICAL LIGHT HANDLE (MISCELLANEOUS) ×3 IMPLANT
DECANTER SPIKE VIAL GLASS SM (MISCELLANEOUS) IMPLANT
DERMABOND ADHESIVE PROPEN (GAUZE/BANDAGES/DRESSINGS) ×1
DERMABOND ADVANCED (GAUZE/BANDAGES/DRESSINGS) ×1
DERMABOND ADVANCED .7 DNX12 (GAUZE/BANDAGES/DRESSINGS) ×2 IMPLANT
DERMABOND ADVANCED .7 DNX6 (GAUZE/BANDAGES/DRESSINGS) ×2 IMPLANT
DRAPE C-ARM 42X72 X-RAY (DRAPES) IMPLANT
DRAPE UTILITY 15X26 W/TAPE STR (DRAPE) ×6 IMPLANT
ELECT REM PT RETURN 9FT ADLT (ELECTROSURGICAL) ×3
ELECTRODE REM PT RTRN 9FT ADLT (ELECTROSURGICAL) ×2 IMPLANT
GLOVE BIOGEL PI IND STRL 8 (GLOVE) ×2 IMPLANT
GLOVE BIOGEL PI INDICATOR 8 (GLOVE) ×1
GLOVE ECLIPSE 7.5 STRL STRAW (GLOVE) ×6 IMPLANT
GOWN STRL NON-REIN LRG LVL3 (GOWN DISPOSABLE) ×6 IMPLANT
KIT BASIN OR (CUSTOM PROCEDURE TRAY) ×3 IMPLANT
KIT ROOM TURNOVER OR (KITS) ×3 IMPLANT
NS IRRIG 1000ML POUR BTL (IV SOLUTION) ×3 IMPLANT
PAD ARMBOARD 7.5X6 YLW CONV (MISCELLANEOUS) ×6 IMPLANT
POUCH SPECIMEN RETRIEVAL 10MM (ENDOMECHANICALS) ×3 IMPLANT
SCISSORS LAP 5X35 DISP (ENDOMECHANICALS) IMPLANT
SET CHOLANGIOGRAPH 5 50 .035 (SET/KITS/TRAYS/PACK) IMPLANT
SET IRRIG TUBING LAPAROSCOPIC (IRRIGATION / IRRIGATOR) ×3 IMPLANT
SLEEVE ENDOPATH XCEL 5M (ENDOMECHANICALS) ×3 IMPLANT
SPECIMEN JAR SMALL (MISCELLANEOUS) ×3 IMPLANT
STRIP CLOSURE SKIN 1/2X4 (GAUZE/BANDAGES/DRESSINGS) ×3 IMPLANT
SUT MNCRL AB 4-0 PS2 18 (SUTURE) ×3 IMPLANT
TOWEL OR 17X24 6PK STRL BLUE (TOWEL DISPOSABLE) ×3 IMPLANT
TOWEL OR 17X26 10 PK STRL BLUE (TOWEL DISPOSABLE) ×3 IMPLANT
TRAY LAPAROSCOPIC (CUSTOM PROCEDURE TRAY) ×3 IMPLANT
TROCAR XCEL BLUNT TIP 100MML (ENDOMECHANICALS) ×3 IMPLANT
TROCAR XCEL NON-BLD 11X100MML (ENDOMECHANICALS) ×3 IMPLANT
TROCAR XCEL NON-BLD 5MMX100MML (ENDOMECHANICALS) ×3 IMPLANT
WATER STERILE IRR 1000ML POUR (IV SOLUTION) IMPLANT

## 2011-07-18 NOTE — Progress Notes (Signed)
Received report from PACU nurse re:  Patient's status during surgery.

## 2011-07-18 NOTE — Progress Notes (Signed)
The patient did not get his gallbladder removed yesterday because he continued to take his Pletal which has about a 13 hours half-life.  It has been discontinued.  Okay to go ahead with surgery today.  Marta Lamas. Gae Bon, MD, FACS [phone] [facility]

## 2011-07-18 NOTE — Anesthesia Postprocedure Evaluation (Signed)
  Anesthesia Post-op Note  Patient: Zachary [name]  Procedure(s) Performed: Procedure(s) (LRB): LAPAROSCOPIC CHOLECYSTECTOMY (N/A)  Patient Location: PACU  Anesthesia Type: General  Level of Consciousness: awake, alert  and oriented  Airway and Oxygen Therapy: Patient Spontanous Breathing and Patient connected to nasal cannula oxygen  Post-op Pain: mild  Post-op Assessment: Post-op Vital signs reviewed and Patient's Cardiovascular Status Stable  Post-op Vital Signs: stable  Complications: No apparent anesthesia complications

## 2011-07-18 NOTE — Transfer of Care (Signed)
Immediate Anesthesia Transfer of Care Note  Patient: Zachary [name]  Procedure(s) Performed: Procedure(s) (LRB): LAPAROSCOPIC CHOLECYSTECTOMY (N/A)  Patient Location: PACU  Anesthesia Type: General  Level of Consciousness: awake, alert , oriented and patient cooperative  Airway & Oxygen Therapy: Patient Spontanous Breathing and Patient connected to nasal cannula oxygen  Post-op Assessment: Report given to PACU RN and Post -op Vital signs reviewed and stable  Post vital signs: Reviewed and stable  Complications: No apparent anesthesia complications

## 2011-07-18 NOTE — Op Note (Signed)
OPERATIVE REPORT  DATE OF OPERATION: 07/15/2011 - 07/18/2011  PATIENT:  Zachary [name]  78 y.[name]. male  PRE-OPERATIVE DIAGNOSIS:  cholecystitis, cholelithiasis  POST-OPERATIVE DIAGNOSIS:  cholecystitis, cholelithiasis  PROCEDURE:  Procedure(s): LAPAROSCOPIC CHOLECYSTECTOMY  SURGEON:  Surgeon(s): Cherylynn Ridges, MD  ASSISTANT: None  ANESTHESIA:   general  EBL: <20 ml  BLOOD ADMINISTERED: none  DRAINS: none   SPECIMEN:  Source of Specimen:  Gallbladder  COUNTS CORRECT:  YES  PROCEDURE DETAILS: The patient was taken to the operating room and placed on the table in the supine position.  After an adequate endotracheal anesthetic was administered, /he was prepped with ChloroPrep, and then draped in the usual manner exposing the entire abdomen laterally, inferiorly and up  to the costal margins.  After a proper timeout was performed including identifying the patient and the procedure to be performed, a supra-umbilical1.5cm midline incision was made using a #15 blade.  This was taken down to the fascia which was then incised with a #15 blade.  The edges of the fascia were tented up with Kocher clamps as the preperitoneal space was penetrated with a Kelly clamp into the peritoneum.  Once this was done, a pursestring suture of 0 Vicryl was passed around the fascial opening.  This was subsequently used to secure the [facility] cannula which was passed into the peritoneal cavity.  Once the [facility] cannula was in place, carbon dioxide gas was insufflated into the peritoneal cavity up to a maximal intra-abdominal pressure of 15mm Hg.The laparoscope, with attached camera and light source, was passed into the peritoneal cavity to visualize the direct insertion of two right upper quadrant 5mm cannulas, and a sup-xiphoid 10-65mm cannula.  Once all cannulas were in place, the dissection was begun.  Two ratcheted graspers were attached to the dome and infundibulum of the gallbladder and retracted towards  the anterior abdominal wall and the right upper quadrant.  Using cautery attached to a dissecting forceps, the peritoneum overlaying the triangle of Chalot and the hepatoduodenal triangle was dissected away exposing the cystic duct and the cystic artery.  A clip was placed on the gallbladder side of the cystic duct, then the distal cystic duct was clipped multiple times then transected.  The gallbladder was then dissected out of the hepatic bed without event.  It was retrieved from the abdomen using an EndoCatch bag without event.  No attempt was made to perform a cholangiogram because the cystic duct was short and fat.  Once the gallbladder was removed, the bed was inspected for hemostasis.  Once excellent hemostasis was obtained all gas and fluids were aspirated from above the liver, then the cannulas were removed.  The supra-umbilical incision was closed using the pursestring suture which was in place.  0.25% bupivicaine with epinephrine was injected at all sites.  All 10mm or greater cannula sites were close using a running subcuticular stitch of 4-0 Monocryl.  5.60mm cannula sites were closed with Dermabond only.Steri-Strips and Tagaderm were used to complete the dressings at all sites.  At this point all needle, sponge, and instrument counts were correct.The patient was awakened from anesthesia and taken to the PACU in stable condition.   PATIENT DISPOSITION:  PACU - hemodynamically stable.   Zachary [name],Zachary [name] 3/31/201311:08 AM

## 2011-07-18 NOTE — Anesthesia Preprocedure Evaluation (Addendum)
Anesthesia Evaluation  Patient identified by MRN, date of birth, ID band Patient awake    Reviewed: Allergy & Precautions, H&P , NPO status , Patient's Chart, lab work & pertinent test results  Airway Mallampati: II TM Distance: >3 FB Neck ROM: Full    Dental  (+) Teeth Intact   Pulmonary  breath sounds clear to auscultation        Cardiovascular - dysrhythmias Rhythm:Regular Rate:Normal  Pt denies HTN or dysrhythmias   Neuro/Psych    GI/Hepatic   Endo/Other    Renal/GU      Musculoskeletal   Abdominal   Peds  Hematology   Anesthesia Other Findings   Reproductive/Obstetrics                          Anesthesia Physical Anesthesia Plan  ASA: III  Anesthesia Plan: General   Post-op Pain Management:    Induction: Intravenous  Airway Management Planned: Oral ETT  Additional Equipment:   Intra-op Plan:   Post-operative Plan: Extubation in OR  Informed Consent: I have reviewed the patients History and Physical, chart, labs and discussed the procedure including the risks, benefits and alternatives for the proposed anesthesia with the patient or authorized representative who has indicated his/her understanding and acceptance.   Dental advisory given  Plan Discussed with:   Anesthesia Plan Comments: (Htn H/O PVCs H/O Pancreatitis  Plan GA  Kipp Brood, MD)       Anesthesia Quick Evaluation

## 2011-07-18 NOTE — Anesthesia Procedure Notes (Signed)
Procedure Name: Intubation Date/Time: 07/18/2011 10:19 AM Performed by: Leona Singleton A Pre-anesthesia Checklist: Patient identified Patient Re-evaluated:Patient Re-evaluated prior to inductionOxygen Delivery Method: Circle system utilized Preoxygenation: Pre-oxygenation with 100% oxygen Intubation Type: IV induction Ventilation: Mask ventilation without difficulty and Oral airway inserted - appropriate to patient size Laryngoscope Size: Hyacinth Meeker and 2 Grade View: Grade II Tube type: Oral Tube size: 7.5 mm Number of attempts: 1 Airway Equipment and Method: Stylet Placement Confirmation: ETT inserted through vocal cords under direct vision,  positive ETCO2 and breath sounds checked- equal and bilateral Secured at: 21 cm Tube secured with: Tape Dental Injury: Teeth and Oropharynx as per pre-operative assessment

## 2011-07-18 NOTE — Preoperative (Signed)
Beta Blockers   Reason not to administer Beta Blockers:Not Applicable 

## 2011-07-18 NOTE — Progress Notes (Signed)
Subjective: Patient feels well without complaints. Surgery delayed because patient was on Pletal. This was discontinued and he is having surgery today  Objective: Weight change:   Intake/Output Summary (Last 24 hours) at 07/18/11 1610 Last data filed at 07/18/11 0800  Gross per 24 hour  Intake    900 ml  Output    200 ml  Net    700 ml    Filed Vitals:   07/18/11 0329 07/18/11 0500 07/18/11 0800 07/18/11 0849  BP: 160/85   188/49  Pulse:    75  Temp: 98.7 F (37.1 C)  98.2 F (36.8 C)   TempSrc: Oral  Oral   Resp:    18  Height:      Weight:  90.6 kg (199 lb 11.8 oz)    SpO2: 93%   95%   General Appearance: Alert, cooperative, no distress, appears stated age  Head: Normocephalic, without obvious abnormality, atraumatic  Neck: Supple, symmetrical  Lungs: Clear to auscultation bilaterally, respirations unlabored  Heart: Regular rate and rhythm, S1 and S2 normal, no murmur, rub or gallop  Abdomen: Soft, non-tender, bowel sounds active all four quadrants, no masses, no organomegaly  Extremities: Extremities normal, atraumatic, no cyanosis or edema  Pulses: 2+ and symmetric all extremities  Skin: Skin color, texture, turgor normal, no rashes or lesions  Neuro: CNII-XII intact. Nonfocal    Lab Results:  Basename 07/17/11 0500 07/16/11 1005  NA 138 141  K 3.5 4.2  CL 102 105  CO2 26 28  GLUCOSE 143* 172*  BUN 12 12  CREATININE 0.89 0.95  CALCIUM 9.0 9.1  MG -- --  PHOS -- --    Basename 07/17/11 0500 07/16/11 1005  AST 15 16  ALT 12 12  ALKPHOS 57 61  BILITOT 0.5 0.7  PROT 6.6 6.9  ALBUMIN 3.2* 3.2*   No results found for this basename: LIPASE:2,AMYLASE:2 in the last 72 hours  Basename 07/17/11 0500  WBC 12.3*  NEUTROABS --  HGB 13.8  HCT 40.2  MCV 92.2  PLT 258    Basename 07/15/11 2128 07/15/11 1414  CKTOTAL 133 102  CKMB 2.6 2.5  CKMBINDEX -- --  TROPONINI <0.30 <0.30   No components found with this basename: POCBNP:3 No results found for  this basename: DDIMER:2 in the last 72 hours No results found for this basename: HGBA1C:2 in the last 72 hours No results found for this basename: CHOL:2,HDL:2,LDLCALC:2,TRIG:2,CHOLHDL:2,LDLDIRECT:2 in the last 72 hours No results found for this basename: TSH,T4TOTAL,FREET3,T3FREE,THYROIDAB in the last 72 hours No results found for this basename: VITAMINB12:2,FOLATE:2,FERRITIN:2,TIBC:2,IRON:2,RETICCTPCT:2 in the last 72 hours  Studies/Results: Nm Hepatobiliary  07/16/2011  *RADIOLOGY REPORT*  Clinical Data:  Abdominal pain and evaluate for cholecystitis.  NUCLEAR MEDICINE HEPATOBILIARY IMAGING  Technique:  Sequential images of the abdomen were obtained out to 60 minutes following intravenous administration of radiopharmaceutical. 3.7 mg of morphine was given at 60 minutes. Additional images were obtained up to 90 minutes after radiopharmaceutical injection.  Radiopharmaceutical:  Tc-20m Choletec  Comparison:  Ultrasound 07/15/2011 and abdominal CT 12/16/2008  Findings: Radiopharmaceutical was taken up by the liver and excreted to the biliary system and small bowel.  There appears be some accumulation of the radiopharmaceutical in the duodenal bulb. There is no clear uptake within the gallbladder even after the administration of morphine.  IMPRESSION: No evidence for gallbladder filling.  Findings are suggestive for an occluded cystic duct and concerning for cholecystitis.  Original Report Authenticated By: Richarda Overlie, M.D.   Medications:  Scheduled Meds:   . amLODipine  2.5 mg Oral Daily  . bisacodyl  10 mg Rectal NOW  . ciprofloxacin  400 mg Intravenous Q12H  . docusate sodium  100 mg Oral BID  . pantoprazole  40 mg Oral Q1200  . sodium chloride  3 mL Intravenous Q12H  . Tamsulosin HCl  0.4 mg Oral Daily  . DISCONTD: aspirin EC  81 mg Oral Daily  . DISCONTD: cilostazol  100 mg Oral BID  . DISCONTD: pentoxifylline  400 mg Oral TID WC   Continuous Infusions:   . nitroGLYCERIN Stopped  (07/15/11 0800)   PRN Meds:.sodium chloride, acetaminophen, acetaminophen, albuterol, alum & mag hydroxide-simeth, guaiFENesin-dextromethorphan, HYDROcodone-acetaminophen, hydrocortisone, ondansetron (ZOFRAN) IV, ondansetron, zolpidem  Assessment/Plan:  Patient Active Problem List  Diagnoses  Date Noted  .  Hypertension - blood pressure elevated systolically but patient may simply be anxious about surgery. Diastolic is okay. Resume amlodipine after surgery. May give amlodipine 5 mg prior to surgery if okay with surgical team and if need for lowering systolic blood pressure prior to surgery is immediate. He is asymptomatic 07/15/2011  .  Leukocytosis - recheck CBC in a.m. 07/15/2011  .  Epigastric abdominal pain - as occluded cystic duct - for cholecystectomy today as per surgery  07/15/2011  .  PVD (peripheral vascular disease)     LOS: 3 days   Zachary [name] 07/18/2011, 9:27 AM

## 2011-07-19 DIAGNOSIS — K810 Acute cholecystitis: Secondary | ICD-10-CM | POA: Diagnosis present

## 2011-07-19 MED ORDER — HYDROCODONE-ACETAMINOPHEN 5-325 MG PO TABS: 1.0000 | ORAL_TABLET | Freq: Four times a day (QID) | ORAL | Status: AC | PRN

## 2011-07-19 NOTE — Discharge Instructions (Signed)
[facility] - DISCHARGE INSTRUCTIONS TO PATIENT  Return to work on:  NA  Activity:  Driving - May drive in 3 to 4 days,    Lifting - No lifting x 1 week  Wound Care:   May shower starting tomorrow  Diet:  Heart healthy  Follow up appointment:  Call Dr. Dixon Boos office [facility]) at [phone] for an appointment in 2 to 3 weeks  Medications and dosages:  Resume your home medications.  You have a prescription for:  Vicodin  Call Dr. Lindie Spruce or his office  [phone]) if you have:  Temperature greater than 100.4,  Persistent nausea and vomiting,  Severe uncontrolled pain,  Redness, tenderness, or signs of infection (pain, swelling, redness, odor or green/yellow discharge around the site),  Difficulty breathing, headache or visual disturbances,  Any other questions or concerns you may have after discharge.  In an emergency, call 911 or go to an Emergency Department at a nearby hospital.

## 2011-07-19 NOTE — Discharge Summary (Signed)
Physician Discharge Summary  Zachary [name]  OZH:[id number]  DOB: 09-01-1921   Admit date: 07/15/2011 Discharge date: 07/19/2011  Admitting Diagnosis: Epigastric pain  Discharge Diagnoses:  Principal Problem:  *Acute cholecystitis Active Problems:  Hypertension  PVD (peripheral vascular disease)   Discharge Condition: improved  Hospital Course: Patient admitted and MI ruled out on the basis of serial enzymes. He had no recurrent pain. [place] of GB suggested acute cholecystitis and this was confirmed with non-filling of GB on HIDA scan. He was scheduled for surgery. This had to be delayed on day to allow Pletal to be held. Paitent did well during and after surgery.   Consults: General surgery  Disposition:   Medication List  As of 07/19/2011  7:45 AM   TAKE these medications         aspirin EC 81 MG tablet   Take 81 mg by mouth daily.      cilostazol 100 MG tablet   Commonly known as: PLETAL   Take 100 mg by mouth 2 (two) times daily.      flurazepam 30 MG capsule   Commonly known as: DALMANE   Take 30 mg by mouth at bedtime as needed. sleep      hydrocortisone 25 MG suppository   Commonly known as: ANUSOL-HC   Place 25 mg rectally daily as needed. hemorrhoids      pentoxifylline 400 MG CR tablet   Commonly known as: TRENTAL   Take 400 mg by mouth 3 (three) times daily with meals.      Tamsulosin HCl 0.4 MG Caps   Commonly known as: FLOMAX   Take 0.4 mg by mouth.           Follow-up Information    Follow up with Darnelle Bos, MD on 09/09/2011. (as scheduled)          Things to follow up in the outpatient setting: none  Time coordinating discharge: 25 minutes including medication reconciliation, transmission of prescriptions to pharmacy, preparation of discharge papers, and discussion with patient     Signed: Darnelle Bos 07/19/2011, 7:45 AM

## 2011-07-20 ENCOUNTER — Encounter (HOSPITAL_COMMUNITY): Payer: Self-pay | Admitting: General Surgery

## 2011-07-27 DIAGNOSIS — E559 Vitamin D deficiency, unspecified: Secondary | ICD-10-CM | POA: Diagnosis not present

## 2011-07-27 DIAGNOSIS — R5381 Other malaise: Secondary | ICD-10-CM | POA: Diagnosis not present

## 2011-07-27 DIAGNOSIS — I70219 Atherosclerosis of native arteries of extremities with intermittent claudication, unspecified extremity: Secondary | ICD-10-CM | POA: Diagnosis not present

## 2011-08-02 ENCOUNTER — Telehealth (INDEPENDENT_AMBULATORY_CARE_PROVIDER_SITE_OTHER): Payer: Self-pay | Admitting: General Surgery

## 2011-08-06 ENCOUNTER — Ambulatory Visit (INDEPENDENT_AMBULATORY_CARE_PROVIDER_SITE_OTHER): Payer: Medicare Other | Admitting: General Surgery

## 2011-08-06 ENCOUNTER — Encounter (INDEPENDENT_AMBULATORY_CARE_PROVIDER_SITE_OTHER): Payer: Self-pay | Admitting: General Surgery

## 2011-08-06 VITALS — BP 140/86 | Ht 70.0 in | Wt 210.8 lb

## 2011-08-06 DIAGNOSIS — Z09 Encounter for follow-up examination after completed treatment for conditions other than malignant neoplasm: Secondary | ICD-10-CM

## 2011-08-06 NOTE — Progress Notes (Signed)
HPI The patient is doing well status post laparoscopic cholecystectomy with cholangiogram.  PE His wounds have healed well with no evidence of infection. A supraumbilical incision has a small scab on it that will probably come off in the next week to 2 weeks. I placed a small amount of antibiotic ointment on it and a Band-Aid.  Studiy review None  Assessment Status post laparoscopic cholecystectomy. No cholangiogram was performed.  Plan Return to see me on a p.r.n. basis

## 2011-08-12 DIAGNOSIS — H40059 Ocular hypertension, unspecified eye: Secondary | ICD-10-CM | POA: Diagnosis not present

## 2011-08-12 DIAGNOSIS — H04129 Dry eye syndrome of unspecified lacrimal gland: Secondary | ICD-10-CM | POA: Diagnosis not present

## 2011-08-12 DIAGNOSIS — H2000 Unspecified acute and subacute iridocyclitis: Secondary | ICD-10-CM | POA: Diagnosis not present

## 2011-08-12 DIAGNOSIS — H01009 Unspecified blepharitis unspecified eye, unspecified eyelid: Secondary | ICD-10-CM | POA: Diagnosis not present

## 2011-09-09 DIAGNOSIS — R5381 Other malaise: Secondary | ICD-10-CM | POA: Diagnosis not present

## 2011-09-09 DIAGNOSIS — E1159 Type 2 diabetes mellitus with other circulatory complications: Secondary | ICD-10-CM | POA: Diagnosis not present

## 2011-09-09 DIAGNOSIS — N529 Male erectile dysfunction, unspecified: Secondary | ICD-10-CM | POA: Diagnosis not present

## 2011-09-09 DIAGNOSIS — I739 Peripheral vascular disease, unspecified: Secondary | ICD-10-CM | POA: Diagnosis not present

## 2011-09-17 DIAGNOSIS — R5381 Other malaise: Secondary | ICD-10-CM | POA: Diagnosis not present

## 2011-09-17 DIAGNOSIS — E1159 Type 2 diabetes mellitus with other circulatory complications: Secondary | ICD-10-CM | POA: Diagnosis not present

## 2011-09-17 DIAGNOSIS — I739 Peripheral vascular disease, unspecified: Secondary | ICD-10-CM | POA: Diagnosis not present

## 2011-09-17 DIAGNOSIS — N529 Male erectile dysfunction, unspecified: Secondary | ICD-10-CM | POA: Diagnosis not present

## 2011-09-17 DIAGNOSIS — E291 Testicular hypofunction: Secondary | ICD-10-CM | POA: Diagnosis not present

## 2011-09-24 DIAGNOSIS — E291 Testicular hypofunction: Secondary | ICD-10-CM | POA: Diagnosis not present

## 2011-11-08 DIAGNOSIS — R5381 Other malaise: Secondary | ICD-10-CM | POA: Diagnosis not present

## 2011-11-24 DIAGNOSIS — E1159 Type 2 diabetes mellitus with other circulatory complications: Secondary | ICD-10-CM | POA: Diagnosis not present

## 2011-11-24 DIAGNOSIS — R5383 Other fatigue: Secondary | ICD-10-CM | POA: Diagnosis not present

## 2011-11-24 DIAGNOSIS — R5381 Other malaise: Secondary | ICD-10-CM | POA: Diagnosis not present

## 2011-11-24 DIAGNOSIS — I739 Peripheral vascular disease, unspecified: Secondary | ICD-10-CM | POA: Diagnosis not present

## 2011-11-29 DIAGNOSIS — C61 Malignant neoplasm of prostate: Secondary | ICD-10-CM | POA: Diagnosis not present

## 2011-11-29 DIAGNOSIS — R300 Dysuria: Secondary | ICD-10-CM | POA: Diagnosis not present

## 2011-12-10 DIAGNOSIS — H2000 Unspecified acute and subacute iridocyclitis: Secondary | ICD-10-CM | POA: Diagnosis not present

## 2011-12-10 DIAGNOSIS — Z961 Presence of intraocular lens: Secondary | ICD-10-CM | POA: Diagnosis not present

## 2011-12-10 DIAGNOSIS — H40059 Ocular hypertension, unspecified eye: Secondary | ICD-10-CM | POA: Diagnosis not present

## 2011-12-10 DIAGNOSIS — H10509 Unspecified blepharoconjunctivitis, unspecified eye: Secondary | ICD-10-CM | POA: Diagnosis not present

## 2011-12-13 DIAGNOSIS — R5381 Other malaise: Secondary | ICD-10-CM | POA: Diagnosis not present

## 2011-12-22 DIAGNOSIS — E291 Testicular hypofunction: Secondary | ICD-10-CM | POA: Diagnosis not present

## 2011-12-22 DIAGNOSIS — R5381 Other malaise: Secondary | ICD-10-CM | POA: Diagnosis not present

## 2011-12-22 DIAGNOSIS — R5383 Other fatigue: Secondary | ICD-10-CM | POA: Diagnosis not present

## 2011-12-22 DIAGNOSIS — C61 Malignant neoplasm of prostate: Secondary | ICD-10-CM | POA: Diagnosis not present

## 2012-01-05 DIAGNOSIS — E291 Testicular hypofunction: Secondary | ICD-10-CM | POA: Diagnosis not present

## 2012-01-06 DIAGNOSIS — Z23 Encounter for immunization: Secondary | ICD-10-CM | POA: Diagnosis not present

## 2012-01-19 DIAGNOSIS — E291 Testicular hypofunction: Secondary | ICD-10-CM | POA: Diagnosis not present

## 2012-02-01 DIAGNOSIS — E291 Testicular hypofunction: Secondary | ICD-10-CM | POA: Diagnosis not present

## 2012-02-15 DIAGNOSIS — E291 Testicular hypofunction: Secondary | ICD-10-CM | POA: Diagnosis not present

## 2012-02-22 DIAGNOSIS — J209 Acute bronchitis, unspecified: Secondary | ICD-10-CM | POA: Diagnosis not present

## 2012-02-29 DIAGNOSIS — E291 Testicular hypofunction: Secondary | ICD-10-CM | POA: Diagnosis not present

## 2012-03-13 DIAGNOSIS — E1159 Type 2 diabetes mellitus with other circulatory complications: Secondary | ICD-10-CM | POA: Diagnosis not present

## 2012-03-13 DIAGNOSIS — I739 Peripheral vascular disease, unspecified: Secondary | ICD-10-CM | POA: Diagnosis not present

## 2012-03-13 DIAGNOSIS — R5383 Other fatigue: Secondary | ICD-10-CM | POA: Diagnosis not present

## 2012-03-13 DIAGNOSIS — E236 Other disorders of pituitary gland: Secondary | ICD-10-CM | POA: Diagnosis not present

## 2012-03-13 DIAGNOSIS — R5381 Other malaise: Secondary | ICD-10-CM | POA: Diagnosis not present

## 2012-03-27 DIAGNOSIS — E291 Testicular hypofunction: Secondary | ICD-10-CM | POA: Diagnosis not present

## 2012-04-17 ENCOUNTER — Other Ambulatory Visit: Payer: Self-pay | Admitting: Diagnostic Neuroimaging

## 2012-04-17 DIAGNOSIS — R269 Unspecified abnormalities of gait and mobility: Secondary | ICD-10-CM

## 2012-04-17 DIAGNOSIS — G20 Parkinson's disease: Secondary | ICD-10-CM

## 2012-04-18 DIAGNOSIS — E291 Testicular hypofunction: Secondary | ICD-10-CM | POA: Diagnosis not present

## 2012-04-27 DIAGNOSIS — R269 Unspecified abnormalities of gait and mobility: Secondary | ICD-10-CM | POA: Diagnosis not present

## 2012-04-27 DIAGNOSIS — I739 Peripheral vascular disease, unspecified: Secondary | ICD-10-CM | POA: Diagnosis not present

## 2012-04-27 DIAGNOSIS — IMO0001 Reserved for inherently not codable concepts without codable children: Secondary | ICD-10-CM | POA: Diagnosis not present

## 2012-04-27 DIAGNOSIS — G20 Parkinson's disease: Secondary | ICD-10-CM | POA: Diagnosis not present

## 2012-04-27 DIAGNOSIS — Z9181 History of falling: Secondary | ICD-10-CM | POA: Diagnosis not present

## 2012-04-27 DIAGNOSIS — E119 Type 2 diabetes mellitus without complications: Secondary | ICD-10-CM | POA: Diagnosis not present

## 2012-05-01 ENCOUNTER — Ambulatory Visit
Admission: RE | Admit: 2012-05-01 | Discharge: 2012-05-01 | Disposition: A | Discharge status: Home / Self Care | Payer: Medicare Other | Source: Ambulatory Visit | Attending: Diagnostic Neuroimaging | Admitting: Diagnostic Neuroimaging

## 2012-05-01 ENCOUNTER — Other Ambulatory Visit: Payer: Self-pay | Admitting: Diagnostic Neuroimaging

## 2012-05-01 DIAGNOSIS — R269 Unspecified abnormalities of gait and mobility: Secondary | ICD-10-CM

## 2012-05-01 DIAGNOSIS — G20 Parkinson's disease: Secondary | ICD-10-CM

## 2012-05-01 DIAGNOSIS — M6281 Muscle weakness (generalized): Secondary | ICD-10-CM | POA: Diagnosis not present

## 2012-05-02 DIAGNOSIS — E291 Testicular hypofunction: Secondary | ICD-10-CM | POA: Diagnosis not present

## 2012-05-03 DIAGNOSIS — E119 Type 2 diabetes mellitus without complications: Secondary | ICD-10-CM | POA: Diagnosis not present

## 2012-05-03 DIAGNOSIS — IMO0001 Reserved for inherently not codable concepts without codable children: Secondary | ICD-10-CM | POA: Diagnosis not present

## 2012-05-03 DIAGNOSIS — I739 Peripheral vascular disease, unspecified: Secondary | ICD-10-CM | POA: Diagnosis not present

## 2012-05-03 DIAGNOSIS — R269 Unspecified abnormalities of gait and mobility: Secondary | ICD-10-CM | POA: Diagnosis not present

## 2012-05-03 DIAGNOSIS — Z9181 History of falling: Secondary | ICD-10-CM | POA: Diagnosis not present

## 2012-05-03 DIAGNOSIS — G20 Parkinson's disease: Secondary | ICD-10-CM | POA: Diagnosis not present

## 2012-05-05 DIAGNOSIS — IMO0001 Reserved for inherently not codable concepts without codable children: Secondary | ICD-10-CM | POA: Diagnosis not present

## 2012-05-05 DIAGNOSIS — R269 Unspecified abnormalities of gait and mobility: Secondary | ICD-10-CM | POA: Diagnosis not present

## 2012-05-05 DIAGNOSIS — E119 Type 2 diabetes mellitus without complications: Secondary | ICD-10-CM | POA: Diagnosis not present

## 2012-05-05 DIAGNOSIS — Z9181 History of falling: Secondary | ICD-10-CM | POA: Diagnosis not present

## 2012-05-05 DIAGNOSIS — I739 Peripheral vascular disease, unspecified: Secondary | ICD-10-CM | POA: Diagnosis not present

## 2012-05-05 DIAGNOSIS — G20 Parkinson's disease: Secondary | ICD-10-CM | POA: Diagnosis not present

## 2012-05-08 ENCOUNTER — Emergency Department (HOSPITAL_COMMUNITY): Payer: Medicare Other

## 2012-05-08 ENCOUNTER — Emergency Department (HOSPITAL_COMMUNITY)
Admission: EM | Admit: 2012-05-08 | Discharge: 2012-05-08 | Disposition: A | Discharge status: Home / Self Care | Payer: Medicare Other | Attending: Emergency Medicine | Admitting: Emergency Medicine

## 2012-05-08 ENCOUNTER — Encounter (HOSPITAL_COMMUNITY): Payer: Self-pay | Admitting: Emergency Medicine

## 2012-05-08 DIAGNOSIS — G4700 Insomnia, unspecified: Secondary | ICD-10-CM | POA: Insufficient documentation

## 2012-05-08 DIAGNOSIS — I739 Peripheral vascular disease, unspecified: Secondary | ICD-10-CM | POA: Diagnosis not present

## 2012-05-08 DIAGNOSIS — Z9079 Acquired absence of other genital organ(s): Secondary | ICD-10-CM | POA: Insufficient documentation

## 2012-05-08 DIAGNOSIS — Z9089 Acquired absence of other organs: Secondary | ICD-10-CM | POA: Insufficient documentation

## 2012-05-08 DIAGNOSIS — R404 Transient alteration of awareness: Secondary | ICD-10-CM | POA: Diagnosis not present

## 2012-05-08 DIAGNOSIS — Z8719 Personal history of other diseases of the digestive system: Secondary | ICD-10-CM | POA: Insufficient documentation

## 2012-05-08 DIAGNOSIS — Z79899 Other long term (current) drug therapy: Secondary | ICD-10-CM | POA: Insufficient documentation

## 2012-05-08 DIAGNOSIS — Z7982 Long term (current) use of aspirin: Secondary | ICD-10-CM | POA: Insufficient documentation

## 2012-05-08 DIAGNOSIS — R197 Diarrhea, unspecified: Secondary | ICD-10-CM | POA: Diagnosis not present

## 2012-05-08 DIAGNOSIS — G20 Parkinson's disease: Secondary | ICD-10-CM | POA: Diagnosis not present

## 2012-05-08 DIAGNOSIS — R300 Dysuria: Secondary | ICD-10-CM | POA: Diagnosis not present

## 2012-05-08 DIAGNOSIS — K5900 Constipation, unspecified: Secondary | ICD-10-CM | POA: Diagnosis not present

## 2012-05-08 DIAGNOSIS — Z8546 Personal history of malignant neoplasm of prostate: Secondary | ICD-10-CM | POA: Diagnosis not present

## 2012-05-08 DIAGNOSIS — I10 Essential (primary) hypertension: Secondary | ICD-10-CM | POA: Insufficient documentation

## 2012-05-08 DIAGNOSIS — Z87448 Personal history of other diseases of urinary system: Secondary | ICD-10-CM | POA: Diagnosis not present

## 2012-05-08 DIAGNOSIS — R269 Unspecified abnormalities of gait and mobility: Secondary | ICD-10-CM | POA: Diagnosis not present

## 2012-05-08 DIAGNOSIS — R531 Weakness: Secondary | ICD-10-CM

## 2012-05-08 DIAGNOSIS — R5381 Other malaise: Secondary | ICD-10-CM | POA: Insufficient documentation

## 2012-05-08 DIAGNOSIS — Z87891 Personal history of nicotine dependence: Secondary | ICD-10-CM | POA: Insufficient documentation

## 2012-05-08 DIAGNOSIS — IMO0001 Reserved for inherently not codable concepts without codable children: Secondary | ICD-10-CM | POA: Diagnosis not present

## 2012-05-08 DIAGNOSIS — Z8679 Personal history of other diseases of the circulatory system: Secondary | ICD-10-CM | POA: Insufficient documentation

## 2012-05-08 DIAGNOSIS — R5383 Other fatigue: Secondary | ICD-10-CM | POA: Insufficient documentation

## 2012-05-08 DIAGNOSIS — E119 Type 2 diabetes mellitus without complications: Secondary | ICD-10-CM | POA: Diagnosis not present

## 2012-05-08 DIAGNOSIS — Z9181 History of falling: Secondary | ICD-10-CM | POA: Diagnosis not present

## 2012-05-08 LAB — CBC WITH DIFFERENTIAL/PLATELET
Basophils Relative: 0 % (ref 0–1)
HCT: 44.3 % (ref 39.0–52.0)
Hemoglobin: 14.7 g/dL (ref 13.0–17.0)
Lymphs Abs: 3.1 10*3/uL (ref 0.7–4.0)
MCH: 28.8 pg (ref 26.0–34.0)
MCHC: 33.2 g/dL (ref 30.0–36.0)
Monocytes Absolute: 1.4 10*3/uL — ABNORMAL HIGH (ref 0.1–1.0)
Monocytes Relative: 13 % — ABNORMAL HIGH (ref 3–12)
Neutro Abs: 5.5 10*3/uL (ref 1.7–7.7)
RBC: 5.1 MIL/uL (ref 4.22–5.81)

## 2012-05-08 LAB — URINALYSIS, ROUTINE W REFLEX MICROSCOPIC
Bilirubin Urine: NEGATIVE
Hgb urine dipstick: NEGATIVE
Specific Gravity, Urine: 1.012 (ref 1.005–1.030)
pH: 7 (ref 5.0–8.0)

## 2012-05-08 LAB — COMPREHENSIVE METABOLIC PANEL
ALT: 12 U/L (ref 0–53)
Albumin: 3.4 g/dL — ABNORMAL LOW (ref 3.5–5.2)
BUN: 12 mg/dL (ref 6–23)
Chloride: 103 mEq/L (ref 96–112)
Creatinine, Ser: 1 mg/dL (ref 0.50–1.35)
Glucose, Bld: 82 mg/dL (ref 70–99)
Potassium: 3.6 mEq/L (ref 3.5–5.1)

## 2012-05-08 NOTE — ED Notes (Signed)
Pt arrived by EMS from home. C/o generalized weakness x2 weeks. BP: 193/111 HR:80. No h/a, N/V,or pain. CBG 139.

## 2012-05-08 NOTE — ED Provider Notes (Signed)
History     CSN: [id number]  Arrival date & time 05/08/12  1040   First MD Initiated Contact with Patient 05/08/12 1053      Chief Complaint  Patient presents with  . Weakness    (Consider location/radiation/quality/duration/timing/severity/associated sxs/prior treatment) HPI Comments: This is an 79 year old male, past medical history remarkable for hypertension, and prostate cancer who presents to the emergency department with a chief complaint of generalized weakness for the past 2 weeks. Patient states that last night he also felt a little wobbly, but states that this has resolved today. Patient denies any pain at this time. He denies fever, headache, cough or cold symptoms, chest pain, shortness of breath, nausea, vomiting, numbness or tingling of the extremities. He endorses alternating diarrhea and constipation, but this is normal for him. Additionally he endorses dysuria, which is been going on for "quite some time." He has not tried anything to alleviate his symptoms.  The history is provided by the patient. No language interpreter was used.    Past Medical History  Diagnosis Date  . Prostate cancer 2000  . PVD (peripheral vascular disease)   . Prostate enlargement   . Insomnia   . Pancreatitis   . PVC's (premature ventricular contractions)   . Heart block     Past Surgical History  Procedure Date  . Cholecystectomy 07/18/2011    Procedure: LAPAROSCOPIC CHOLECYSTECTOMY;  Surgeon: Cherylynn Ridges, MD;  Location: [facility] OR;  Service: General;  Laterality: N/A;  . Prostatectomy     Family History  Problem Relation Age of Onset  . Breast cancer Daughter     History  Substance Use Topics  . Smoking status: Former Smoker -- 20 years  . Smokeless tobacco: Not on file  . Alcohol Use: No      Review of Systems  All other systems reviewed and are negative.    Allergies  Penicillins  Home Medications   Current Outpatient Rx  Name  Route  Sig  Dispense  Refill  .  ASPIRIN EC 81 MG PO TBEC   Oral   Take 81 mg by mouth daily.         Marland Kitchen CILOSTAZOL 100 MG PO TABS   Oral   Take 100 mg by mouth 2 (two) times daily.         Marland Kitchen FLURAZEPAM HCL 30 MG PO CAPS   Oral   Take 30 mg by mouth at bedtime as needed. sleep         . HYDROCORTISONE ACETATE 25 MG RE SUPP   Rectal   Place 25 mg rectally daily as needed. hemorrhoids         . PENTOXIFYLLINE ER 400 MG PO TBCR   Oral   Take 400 mg by mouth 3 (three) times daily with meals.         . TAMSULOSIN HCL 0.4 MG PO CAPS   Oral   Take 0.4 mg by mouth.           BP 168/77  Temp 97.7 F (36.5 C) (Oral)  Resp 16  SpO2 95%  Physical Exam  Nursing note and vitals reviewed. Constitutional: He is oriented to person, place, and time. He appears well-developed and well-nourished.  HENT:  Head: Normocephalic and atraumatic.  Nose: Nose normal.  Mouth/Throat: Oropharynx is clear and moist. No oropharyngeal exudate.  Eyes: Conjunctivae normal and EOM are normal. Pupils are equal, round, and reactive to light. Right eye exhibits no discharge. Left eye exhibits no  discharge. No scleral icterus.  Neck: Normal range of motion. Neck supple. No JVD present.  Cardiovascular: Normal rate, regular rhythm, normal heart sounds and intact distal pulses.  Exam reveals no gallop and no friction rub.   No murmur heard. Pulmonary/Chest: Effort normal and breath sounds normal. No respiratory distress. He has no wheezes. He has no rales. He exhibits no tenderness.  Abdominal: Soft. Bowel sounds are normal. He exhibits no distension and no mass. There is no tenderness. There is no rebound and no guarding.  Musculoskeletal: Normal range of motion. He exhibits no edema and no tenderness.  Neurological: He is alert and oriented to person, place, and time. He has normal reflexes.       CN 3-12 intact  Skin: Skin is warm and dry.  Psychiatric: He has a normal mood and affect. His behavior is normal. Judgment and  thought content normal.    ED Course  Procedures (including critical care time)   Labs Reviewed  CBC WITH DIFFERENTIAL  COMPREHENSIVE METABOLIC PANEL  URINALYSIS, ROUTINE W REFLEX MICROSCOPIC  TROPONIN I   Results for orders placed during the hospital encounter of 05/08/12  CBC WITH DIFFERENTIAL      Component Value Range   WBC 10.5  4.0 - 10.5 K/uL   RBC 5.10  4.22 - 5.81 MIL/uL   Hemoglobin 14.7  13.0 - 17.0 g/dL   HCT 16.1  09.6 - 04.5 %   MCV 86.9  78.0 - 100.0 fL   MCH 28.8  26.0 - 34.0 pg   MCHC 33.2  30.0 - 36.0 g/dL   RDW 40.9  81.1 - 91.4 %   Platelets 253  150 - 400 K/uL   Neutrophils Relative 53  43 - 77 %   Neutro Abs 5.5  1.7 - 7.7 K/uL   Lymphocytes Relative 30  12 - 46 %   Lymphs Abs 3.1  0.7 - 4.0 K/uL   Monocytes Relative 13 (*) 3 - 12 %   Monocytes Absolute 1.4 (*) 0.1 - 1.0 K/uL   Eosinophils Relative 4  0 - 5 %   Eosinophils Absolute 0.4  0.0 - 0.7 K/uL   Basophils Relative 0  0 - 1 %   Basophils Absolute 0.0  0.0 - 0.1 K/uL  COMPREHENSIVE METABOLIC PANEL      Component Value Range   Sodium 140  135 - 145 mEq/L   Potassium 3.6  3.5 - 5.1 mEq/L   Chloride 103  96 - 112 mEq/L   CO2 28  19 - 32 mEq/L   Glucose, Bld 82  70 - 99 mg/dL   BUN 12  6 - 23 mg/dL   Creatinine, Ser 7.82  0.50 - 1.35 mg/dL   Calcium 9.0  8.4 - 95.6 mg/dL   Total Protein 6.8  6.0 - 8.3 g/dL   Albumin 3.4 (*) 3.5 - 5.2 g/dL   AST 18  0 - 37 U/L   ALT 12  0 - 53 U/L   Alkaline Phosphatase 63  39 - 117 U/L   Total Bilirubin 0.4  0.3 - 1.2 mg/dL   GFR calc non Af Amer 65 (*) >90 mL/min   GFR calc Af Amer 75 (*) >90 mL/min  URINALYSIS, ROUTINE W REFLEX MICROSCOPIC      Component Value Range   Color, Urine YELLOW  YELLOW   APPearance CLEAR  CLEAR   Specific Gravity, Urine 1.012  1.005 - 1.030   pH 7.0  5.0 - 8.0  Glucose, UA NEGATIVE  NEGATIVE mg/dL   Hgb urine dipstick NEGATIVE  NEGATIVE   Bilirubin Urine NEGATIVE  NEGATIVE   Ketones, ur NEGATIVE  NEGATIVE mg/dL    Protein, ur NEGATIVE  NEGATIVE mg/dL   Urobilinogen, UA 0.2  0.0 - 1.0 mg/dL   Nitrite NEGATIVE  NEGATIVE   Leukocytes, UA NEGATIVE  NEGATIVE  TROPONIN I      Component Value Range   Troponin I <0.30  <0.30 ng/mL    Dg Chest Port 1 View  05/08/2012  *RADIOLOGY REPORT*  Clinical Data: Weakness, hypertension.  PORTABLE CHEST - 1 VIEW  Comparison: 07/15/2011  Findings: Cardiomegaly.  Lungs are clear.  No effusions or edema. No acute bony abnormality.  IMPRESSION: Mild cardiomegaly.  No acute findings.   Original Report Authenticated By: Charlett Nose, M.D.      ED ECG REPORT  I personally interpreted this EKG   Date: 05/08/2012   Rate: 76  Rhythm: normal sinus rhythm  QRS Axis: left  Intervals: PR prolonged  ST/T Wave abnormalities: normal  Conduction Disutrbances:Borderline AV conduction delay  Narrative Interpretation:   Old EKG Reviewed: unchanged    1. Weakness       MDM  79 year old male with generalized weakness, will obtain routine labs and do basic workup. No significant physical exam findings.  12:51 PM Workup today has been negative for acute process, I believe the patient is safe to be discharged to home. The patient has primary care followup in 2 days. Will discharge the patient to home with return precautions. Patient understands and agrees with the plan. He is stable and ready for discharge. This patient was seen by and discussed with Dr. Ranae Palms, who agrees with the plan.        Roxy Horseman, PA-C 05/08/12 1259

## 2012-05-08 NOTE — ED Provider Notes (Signed)
Medical screening examination/treatment/procedure(s) were conducted as a shared visit with non-physician practitioner(s) and myself.  I personally evaluated the patient during the encounter   Loren Racer, MD 05/08/12 1500

## 2012-05-09 DIAGNOSIS — E119 Type 2 diabetes mellitus without complications: Secondary | ICD-10-CM | POA: Diagnosis not present

## 2012-05-09 DIAGNOSIS — I739 Peripheral vascular disease, unspecified: Secondary | ICD-10-CM | POA: Diagnosis not present

## 2012-05-09 DIAGNOSIS — IMO0001 Reserved for inherently not codable concepts without codable children: Secondary | ICD-10-CM | POA: Diagnosis not present

## 2012-05-09 DIAGNOSIS — R269 Unspecified abnormalities of gait and mobility: Secondary | ICD-10-CM | POA: Diagnosis not present

## 2012-05-09 DIAGNOSIS — G20 Parkinson's disease: Secondary | ICD-10-CM | POA: Diagnosis not present

## 2012-05-09 DIAGNOSIS — Z9181 History of falling: Secondary | ICD-10-CM | POA: Diagnosis not present

## 2012-05-11 ENCOUNTER — Other Ambulatory Visit (HOSPITAL_COMMUNITY): Payer: Self-pay | Admitting: Diagnostic Neuroimaging

## 2012-05-11 DIAGNOSIS — R269 Unspecified abnormalities of gait and mobility: Secondary | ICD-10-CM | POA: Diagnosis not present

## 2012-05-11 DIAGNOSIS — IMO0001 Reserved for inherently not codable concepts without codable children: Secondary | ICD-10-CM | POA: Diagnosis not present

## 2012-05-11 DIAGNOSIS — Z9181 History of falling: Secondary | ICD-10-CM | POA: Diagnosis not present

## 2012-05-11 DIAGNOSIS — I6350 Cerebral infarction due to unspecified occlusion or stenosis of unspecified cerebral artery: Secondary | ICD-10-CM

## 2012-05-11 DIAGNOSIS — G20 Parkinson's disease: Secondary | ICD-10-CM | POA: Diagnosis not present

## 2012-05-11 DIAGNOSIS — E119 Type 2 diabetes mellitus without complications: Secondary | ICD-10-CM | POA: Diagnosis not present

## 2012-05-11 DIAGNOSIS — I739 Peripheral vascular disease, unspecified: Secondary | ICD-10-CM | POA: Diagnosis not present

## 2012-05-15 DIAGNOSIS — E119 Type 2 diabetes mellitus without complications: Secondary | ICD-10-CM | POA: Diagnosis not present

## 2012-05-15 DIAGNOSIS — IMO0001 Reserved for inherently not codable concepts without codable children: Secondary | ICD-10-CM | POA: Diagnosis not present

## 2012-05-15 DIAGNOSIS — Z9181 History of falling: Secondary | ICD-10-CM | POA: Diagnosis not present

## 2012-05-15 DIAGNOSIS — R269 Unspecified abnormalities of gait and mobility: Secondary | ICD-10-CM | POA: Diagnosis not present

## 2012-05-15 DIAGNOSIS — G20 Parkinson's disease: Secondary | ICD-10-CM | POA: Diagnosis not present

## 2012-05-15 DIAGNOSIS — I739 Peripheral vascular disease, unspecified: Secondary | ICD-10-CM | POA: Diagnosis not present

## 2012-05-17 DIAGNOSIS — G20 Parkinson's disease: Secondary | ICD-10-CM | POA: Diagnosis not present

## 2012-05-17 DIAGNOSIS — IMO0001 Reserved for inherently not codable concepts without codable children: Secondary | ICD-10-CM | POA: Diagnosis not present

## 2012-05-17 DIAGNOSIS — I739 Peripheral vascular disease, unspecified: Secondary | ICD-10-CM | POA: Diagnosis not present

## 2012-05-17 DIAGNOSIS — Z9181 History of falling: Secondary | ICD-10-CM | POA: Diagnosis not present

## 2012-05-17 DIAGNOSIS — E119 Type 2 diabetes mellitus without complications: Secondary | ICD-10-CM | POA: Diagnosis not present

## 2012-05-17 DIAGNOSIS — R269 Unspecified abnormalities of gait and mobility: Secondary | ICD-10-CM | POA: Diagnosis not present

## 2012-05-18 ENCOUNTER — Other Ambulatory Visit (HOSPITAL_COMMUNITY): Payer: Medicare Other

## 2012-05-18 DIAGNOSIS — IMO0001 Reserved for inherently not codable concepts without codable children: Secondary | ICD-10-CM | POA: Diagnosis not present

## 2012-05-18 DIAGNOSIS — E119 Type 2 diabetes mellitus without complications: Secondary | ICD-10-CM | POA: Diagnosis not present

## 2012-05-18 DIAGNOSIS — G20 Parkinson's disease: Secondary | ICD-10-CM | POA: Diagnosis not present

## 2012-05-18 DIAGNOSIS — R269 Unspecified abnormalities of gait and mobility: Secondary | ICD-10-CM | POA: Diagnosis not present

## 2012-05-18 DIAGNOSIS — I739 Peripheral vascular disease, unspecified: Secondary | ICD-10-CM | POA: Diagnosis not present

## 2012-05-18 DIAGNOSIS — Z9181 History of falling: Secondary | ICD-10-CM | POA: Diagnosis not present

## 2012-05-19 DIAGNOSIS — G20 Parkinson's disease: Secondary | ICD-10-CM | POA: Diagnosis not present

## 2012-05-19 DIAGNOSIS — I739 Peripheral vascular disease, unspecified: Secondary | ICD-10-CM | POA: Diagnosis not present

## 2012-05-19 DIAGNOSIS — R269 Unspecified abnormalities of gait and mobility: Secondary | ICD-10-CM | POA: Diagnosis not present

## 2012-05-19 DIAGNOSIS — Z9181 History of falling: Secondary | ICD-10-CM | POA: Diagnosis not present

## 2012-05-19 DIAGNOSIS — IMO0001 Reserved for inherently not codable concepts without codable children: Secondary | ICD-10-CM | POA: Diagnosis not present

## 2012-05-19 DIAGNOSIS — E119 Type 2 diabetes mellitus without complications: Secondary | ICD-10-CM | POA: Diagnosis not present

## 2012-05-22 DIAGNOSIS — Z9181 History of falling: Secondary | ICD-10-CM | POA: Diagnosis not present

## 2012-05-22 DIAGNOSIS — I739 Peripheral vascular disease, unspecified: Secondary | ICD-10-CM | POA: Diagnosis not present

## 2012-05-22 DIAGNOSIS — R269 Unspecified abnormalities of gait and mobility: Secondary | ICD-10-CM | POA: Diagnosis not present

## 2012-05-22 DIAGNOSIS — E119 Type 2 diabetes mellitus without complications: Secondary | ICD-10-CM | POA: Diagnosis not present

## 2012-05-22 DIAGNOSIS — G20 Parkinson's disease: Secondary | ICD-10-CM | POA: Diagnosis not present

## 2012-05-22 DIAGNOSIS — IMO0001 Reserved for inherently not codable concepts without codable children: Secondary | ICD-10-CM | POA: Diagnosis not present

## 2012-05-24 ENCOUNTER — Ambulatory Visit (HOSPITAL_COMMUNITY): Payer: Medicare Other | Attending: Cardiology | Admitting: Radiology

## 2012-05-24 DIAGNOSIS — E119 Type 2 diabetes mellitus without complications: Secondary | ICD-10-CM | POA: Diagnosis not present

## 2012-05-24 DIAGNOSIS — I10 Essential (primary) hypertension: Secondary | ICD-10-CM | POA: Diagnosis not present

## 2012-05-24 DIAGNOSIS — G459 Transient cerebral ischemic attack, unspecified: Secondary | ICD-10-CM | POA: Diagnosis not present

## 2012-05-24 DIAGNOSIS — I6789 Other cerebrovascular disease: Secondary | ICD-10-CM | POA: Diagnosis not present

## 2012-05-24 DIAGNOSIS — Z8673 Personal history of transient ischemic attack (TIA), and cerebral infarction without residual deficits: Secondary | ICD-10-CM | POA: Diagnosis not present

## 2012-05-24 DIAGNOSIS — E785 Hyperlipidemia, unspecified: Secondary | ICD-10-CM | POA: Diagnosis not present

## 2012-05-24 DIAGNOSIS — I6350 Cerebral infarction due to unspecified occlusion or stenosis of unspecified cerebral artery: Secondary | ICD-10-CM

## 2012-05-24 NOTE — Progress Notes (Signed)
Echocardiogram performed.  

## 2012-05-25 ENCOUNTER — Encounter (HOSPITAL_COMMUNITY): Payer: Self-pay | Admitting: Diagnostic Neuroimaging

## 2012-05-25 DIAGNOSIS — E119 Type 2 diabetes mellitus without complications: Secondary | ICD-10-CM | POA: Diagnosis not present

## 2012-05-25 DIAGNOSIS — G20 Parkinson's disease: Secondary | ICD-10-CM | POA: Diagnosis not present

## 2012-05-25 DIAGNOSIS — Z9181 History of falling: Secondary | ICD-10-CM | POA: Diagnosis not present

## 2012-05-25 DIAGNOSIS — IMO0001 Reserved for inherently not codable concepts without codable children: Secondary | ICD-10-CM | POA: Diagnosis not present

## 2012-05-25 DIAGNOSIS — R269 Unspecified abnormalities of gait and mobility: Secondary | ICD-10-CM | POA: Diagnosis not present

## 2012-05-25 DIAGNOSIS — I739 Peripheral vascular disease, unspecified: Secondary | ICD-10-CM | POA: Diagnosis not present

## 2012-05-26 DIAGNOSIS — G20 Parkinson's disease: Secondary | ICD-10-CM | POA: Diagnosis not present

## 2012-05-26 DIAGNOSIS — I739 Peripheral vascular disease, unspecified: Secondary | ICD-10-CM | POA: Diagnosis not present

## 2012-05-26 DIAGNOSIS — R269 Unspecified abnormalities of gait and mobility: Secondary | ICD-10-CM | POA: Diagnosis not present

## 2012-05-26 DIAGNOSIS — Z9181 History of falling: Secondary | ICD-10-CM | POA: Diagnosis not present

## 2012-05-26 DIAGNOSIS — IMO0001 Reserved for inherently not codable concepts without codable children: Secondary | ICD-10-CM | POA: Diagnosis not present

## 2012-05-26 DIAGNOSIS — E119 Type 2 diabetes mellitus without complications: Secondary | ICD-10-CM | POA: Diagnosis not present

## 2012-05-29 DIAGNOSIS — I739 Peripheral vascular disease, unspecified: Secondary | ICD-10-CM | POA: Diagnosis not present

## 2012-05-29 DIAGNOSIS — R269 Unspecified abnormalities of gait and mobility: Secondary | ICD-10-CM | POA: Diagnosis not present

## 2012-05-29 DIAGNOSIS — G20 Parkinson's disease: Secondary | ICD-10-CM | POA: Diagnosis not present

## 2012-05-29 DIAGNOSIS — E119 Type 2 diabetes mellitus without complications: Secondary | ICD-10-CM | POA: Diagnosis not present

## 2012-05-29 DIAGNOSIS — IMO0001 Reserved for inherently not codable concepts without codable children: Secondary | ICD-10-CM | POA: Diagnosis not present

## 2012-05-29 DIAGNOSIS — Z9181 History of falling: Secondary | ICD-10-CM | POA: Diagnosis not present

## 2012-05-30 DIAGNOSIS — I6350 Cerebral infarction due to unspecified occlusion or stenosis of unspecified cerebral artery: Secondary | ICD-10-CM | POA: Diagnosis not present

## 2012-05-31 DIAGNOSIS — I739 Peripheral vascular disease, unspecified: Secondary | ICD-10-CM | POA: Diagnosis not present

## 2012-05-31 DIAGNOSIS — G20 Parkinson's disease: Secondary | ICD-10-CM | POA: Diagnosis not present

## 2012-05-31 DIAGNOSIS — R269 Unspecified abnormalities of gait and mobility: Secondary | ICD-10-CM | POA: Diagnosis not present

## 2012-05-31 DIAGNOSIS — E119 Type 2 diabetes mellitus without complications: Secondary | ICD-10-CM | POA: Diagnosis not present

## 2012-05-31 DIAGNOSIS — IMO0001 Reserved for inherently not codable concepts without codable children: Secondary | ICD-10-CM | POA: Diagnosis not present

## 2012-05-31 DIAGNOSIS — Z9181 History of falling: Secondary | ICD-10-CM | POA: Diagnosis not present

## 2012-06-12 DIAGNOSIS — C61 Malignant neoplasm of prostate: Secondary | ICD-10-CM | POA: Diagnosis not present

## 2012-06-12 DIAGNOSIS — N529 Male erectile dysfunction, unspecified: Secondary | ICD-10-CM | POA: Diagnosis not present

## 2012-06-14 DIAGNOSIS — E1159 Type 2 diabetes mellitus with other circulatory complications: Secondary | ICD-10-CM | POA: Diagnosis not present

## 2012-06-14 DIAGNOSIS — E236 Other disorders of pituitary gland: Secondary | ICD-10-CM | POA: Diagnosis not present

## 2012-06-14 DIAGNOSIS — I739 Peripheral vascular disease, unspecified: Secondary | ICD-10-CM | POA: Diagnosis not present

## 2012-08-07 DIAGNOSIS — H04129 Dry eye syndrome of unspecified lacrimal gland: Secondary | ICD-10-CM | POA: Diagnosis not present

## 2012-08-07 DIAGNOSIS — H3530 Unspecified macular degeneration: Secondary | ICD-10-CM | POA: Diagnosis not present

## 2012-08-07 DIAGNOSIS — Z961 Presence of intraocular lens: Secondary | ICD-10-CM | POA: Diagnosis not present

## 2012-08-07 DIAGNOSIS — H35379 Puckering of macula, unspecified eye: Secondary | ICD-10-CM | POA: Diagnosis not present

## 2012-09-04 DIAGNOSIS — Z1331 Encounter for screening for depression: Secondary | ICD-10-CM | POA: Diagnosis not present

## 2012-09-04 DIAGNOSIS — I739 Peripheral vascular disease, unspecified: Secondary | ICD-10-CM | POA: Diagnosis not present

## 2012-09-04 DIAGNOSIS — E1159 Type 2 diabetes mellitus with other circulatory complications: Secondary | ICD-10-CM | POA: Diagnosis not present

## 2012-09-20 DIAGNOSIS — M1710 Unilateral primary osteoarthritis, unspecified knee: Secondary | ICD-10-CM | POA: Diagnosis not present

## 2012-09-20 DIAGNOSIS — M545 Low back pain: Secondary | ICD-10-CM | POA: Diagnosis not present

## 2012-10-23 DIAGNOSIS — M1710 Unilateral primary osteoarthritis, unspecified knee: Secondary | ICD-10-CM | POA: Diagnosis not present

## 2012-10-23 DIAGNOSIS — M545 Low back pain: Secondary | ICD-10-CM | POA: Diagnosis not present

## 2012-11-20 DIAGNOSIS — R5383 Other fatigue: Secondary | ICD-10-CM | POA: Diagnosis not present

## 2012-11-20 DIAGNOSIS — I70219 Atherosclerosis of native arteries of extremities with intermittent claudication, unspecified extremity: Secondary | ICD-10-CM | POA: Diagnosis not present

## 2012-11-20 DIAGNOSIS — R5381 Other malaise: Secondary | ICD-10-CM | POA: Diagnosis not present

## 2012-12-07 DIAGNOSIS — M1710 Unilateral primary osteoarthritis, unspecified knee: Secondary | ICD-10-CM | POA: Diagnosis not present

## 2012-12-07 DIAGNOSIS — E1159 Type 2 diabetes mellitus with other circulatory complications: Secondary | ICD-10-CM | POA: Diagnosis not present

## 2012-12-07 DIAGNOSIS — I739 Peripheral vascular disease, unspecified: Secondary | ICD-10-CM | POA: Diagnosis not present

## 2012-12-07 DIAGNOSIS — IMO0002 Reserved for concepts with insufficient information to code with codable children: Secondary | ICD-10-CM | POA: Diagnosis not present

## 2012-12-13 DIAGNOSIS — M1710 Unilateral primary osteoarthritis, unspecified knee: Secondary | ICD-10-CM | POA: Diagnosis not present

## 2012-12-20 DIAGNOSIS — M1710 Unilateral primary osteoarthritis, unspecified knee: Secondary | ICD-10-CM | POA: Diagnosis not present

## 2012-12-25 DIAGNOSIS — Z23 Encounter for immunization: Secondary | ICD-10-CM | POA: Diagnosis not present

## 2012-12-27 DIAGNOSIS — M1710 Unilateral primary osteoarthritis, unspecified knee: Secondary | ICD-10-CM | POA: Diagnosis not present

## 2013-01-03 DIAGNOSIS — M1710 Unilateral primary osteoarthritis, unspecified knee: Secondary | ICD-10-CM | POA: Diagnosis not present

## 2013-01-10 DIAGNOSIS — M1710 Unilateral primary osteoarthritis, unspecified knee: Secondary | ICD-10-CM | POA: Diagnosis not present

## 2013-01-29 DIAGNOSIS — R300 Dysuria: Secondary | ICD-10-CM | POA: Diagnosis not present

## 2013-01-29 DIAGNOSIS — C61 Malignant neoplasm of prostate: Secondary | ICD-10-CM | POA: Diagnosis not present

## 2013-01-29 DIAGNOSIS — N320 Bladder-neck obstruction: Secondary | ICD-10-CM | POA: Diagnosis not present

## 2013-01-29 DIAGNOSIS — R3911 Hesitancy of micturition: Secondary | ICD-10-CM | POA: Diagnosis not present

## 2013-02-21 DIAGNOSIS — H353290 Exudative age-related macular degeneration, unspecified eye, stage unspecified: Secondary | ICD-10-CM | POA: Diagnosis not present

## 2013-02-21 DIAGNOSIS — H35379 Puckering of macula, unspecified eye: Secondary | ICD-10-CM | POA: Diagnosis not present

## 2013-02-21 DIAGNOSIS — H5340 Unspecified visual field defects: Secondary | ICD-10-CM | POA: Diagnosis not present

## 2013-02-21 DIAGNOSIS — H5310 Unspecified subjective visual disturbances: Secondary | ICD-10-CM | POA: Diagnosis not present

## 2013-03-22 DIAGNOSIS — I739 Peripheral vascular disease, unspecified: Secondary | ICD-10-CM | POA: Diagnosis not present

## 2013-03-22 DIAGNOSIS — E1159 Type 2 diabetes mellitus with other circulatory complications: Secondary | ICD-10-CM | POA: Diagnosis not present

## 2013-03-22 DIAGNOSIS — R5381 Other malaise: Secondary | ICD-10-CM | POA: Diagnosis not present

## 2013-05-12 ENCOUNTER — Other Ambulatory Visit: Payer: Self-pay | Admitting: Interventional Cardiology

## 2013-05-15 ENCOUNTER — Other Ambulatory Visit: Payer: Self-pay | Admitting: Nurse Practitioner

## 2013-05-15 ENCOUNTER — Ambulatory Visit
Admission: RE | Admit: 2013-05-15 | Discharge: 2013-05-15 | Disposition: A | Discharge status: Home / Self Care | Payer: Medicare Other | Source: Ambulatory Visit | Attending: Nurse Practitioner | Admitting: Nurse Practitioner

## 2013-05-15 DIAGNOSIS — R5381 Other malaise: Secondary | ICD-10-CM

## 2013-05-15 DIAGNOSIS — R5383 Other fatigue: Principal | ICD-10-CM

## 2013-05-15 DIAGNOSIS — J90 Pleural effusion, not elsewhere classified: Secondary | ICD-10-CM | POA: Diagnosis not present

## 2013-05-15 DIAGNOSIS — E559 Vitamin D deficiency, unspecified: Secondary | ICD-10-CM | POA: Diagnosis not present

## 2013-05-20 ENCOUNTER — Emergency Department (HOSPITAL_COMMUNITY): Payer: Medicare Other

## 2013-05-20 ENCOUNTER — Encounter (HOSPITAL_COMMUNITY): Payer: Self-pay | Admitting: Emergency Medicine

## 2013-05-20 ENCOUNTER — Inpatient Hospital Stay (HOSPITAL_COMMUNITY): Payer: Medicare Other

## 2013-05-20 ENCOUNTER — Inpatient Hospital Stay (HOSPITAL_COMMUNITY)
Admission: EM | Admit: 2013-05-20 | Discharge: 2013-05-30 | DRG: 038 | Disposition: A | Discharge status: Skilled Nursing Facility | Payer: Medicare Other | Attending: Internal Medicine | Admitting: Internal Medicine

## 2013-05-20 DIAGNOSIS — K5900 Constipation, unspecified: Secondary | ICD-10-CM | POA: Diagnosis present

## 2013-05-20 DIAGNOSIS — I6789 Other cerebrovascular disease: Secondary | ICD-10-CM | POA: Diagnosis not present

## 2013-05-20 DIAGNOSIS — Z8546 Personal history of malignant neoplasm of prostate: Secondary | ICD-10-CM

## 2013-05-20 DIAGNOSIS — E119 Type 2 diabetes mellitus without complications: Secondary | ICD-10-CM | POA: Diagnosis not present

## 2013-05-20 DIAGNOSIS — I498 Other specified cardiac arrhythmias: Secondary | ICD-10-CM | POA: Diagnosis present

## 2013-05-20 DIAGNOSIS — I779 Disorder of arteries and arterioles, unspecified: Secondary | ICD-10-CM | POA: Diagnosis not present

## 2013-05-20 DIAGNOSIS — N401 Enlarged prostate with lower urinary tract symptoms: Secondary | ICD-10-CM | POA: Diagnosis not present

## 2013-05-20 DIAGNOSIS — E785 Hyperlipidemia, unspecified: Secondary | ICD-10-CM | POA: Diagnosis present

## 2013-05-20 DIAGNOSIS — I6529 Occlusion and stenosis of unspecified carotid artery: Secondary | ICD-10-CM | POA: Diagnosis not present

## 2013-05-20 DIAGNOSIS — Z87891 Personal history of nicotine dependence: Secondary | ICD-10-CM | POA: Diagnosis not present

## 2013-05-20 DIAGNOSIS — G20A1 Parkinson's disease without dyskinesia, without mention of fluctuations: Secondary | ICD-10-CM | POA: Diagnosis not present

## 2013-05-20 DIAGNOSIS — Z5189 Encounter for other specified aftercare: Secondary | ICD-10-CM | POA: Diagnosis not present

## 2013-05-20 DIAGNOSIS — Z66 Do not resuscitate: Secondary | ICD-10-CM | POA: Diagnosis present

## 2013-05-20 DIAGNOSIS — R5383 Other fatigue: Secondary | ICD-10-CM | POA: Diagnosis not present

## 2013-05-20 DIAGNOSIS — R930 Abnormal findings on diagnostic imaging of skull and head, not elsewhere classified: Secondary | ICD-10-CM | POA: Diagnosis not present

## 2013-05-20 DIAGNOSIS — R339 Retention of urine, unspecified: Secondary | ICD-10-CM | POA: Diagnosis not present

## 2013-05-20 DIAGNOSIS — I7090 Unspecified atherosclerosis: Secondary | ICD-10-CM | POA: Diagnosis present

## 2013-05-20 DIAGNOSIS — I639 Cerebral infarction, unspecified: Secondary | ICD-10-CM

## 2013-05-20 DIAGNOSIS — I739 Peripheral vascular disease, unspecified: Secondary | ICD-10-CM | POA: Diagnosis not present

## 2013-05-20 DIAGNOSIS — I6509 Occlusion and stenosis of unspecified vertebral artery: Secondary | ICD-10-CM | POA: Diagnosis present

## 2013-05-20 DIAGNOSIS — G8190 Hemiplegia, unspecified affecting unspecified side: Secondary | ICD-10-CM | POA: Diagnosis not present

## 2013-05-20 DIAGNOSIS — G40909 Epilepsy, unspecified, not intractable, without status epilepticus: Secondary | ICD-10-CM | POA: Diagnosis not present

## 2013-05-20 DIAGNOSIS — I658 Occlusion and stenosis of other precerebral arteries: Secondary | ICD-10-CM | POA: Diagnosis not present

## 2013-05-20 DIAGNOSIS — I63239 Cerebral infarction due to unspecified occlusion or stenosis of unspecified carotid arteries: Secondary | ICD-10-CM | POA: Diagnosis not present

## 2013-05-20 DIAGNOSIS — C801 Malignant (primary) neoplasm, unspecified: Secondary | ICD-10-CM | POA: Diagnosis not present

## 2013-05-20 DIAGNOSIS — I69959 Hemiplegia and hemiparesis following unspecified cerebrovascular disease affecting unspecified side: Secondary | ICD-10-CM | POA: Diagnosis not present

## 2013-05-20 DIAGNOSIS — I6350 Cerebral infarction due to unspecified occlusion or stenosis of unspecified cerebral artery: Secondary | ICD-10-CM | POA: Diagnosis not present

## 2013-05-20 DIAGNOSIS — G4700 Insomnia, unspecified: Secondary | ICD-10-CM | POA: Diagnosis present

## 2013-05-20 DIAGNOSIS — N138 Other obstructive and reflux uropathy: Secondary | ICD-10-CM | POA: Diagnosis not present

## 2013-05-20 DIAGNOSIS — R0602 Shortness of breath: Secondary | ICD-10-CM | POA: Diagnosis not present

## 2013-05-20 DIAGNOSIS — G20 Parkinson's disease: Secondary | ICD-10-CM | POA: Diagnosis present

## 2013-05-20 DIAGNOSIS — I10 Essential (primary) hypertension: Secondary | ICD-10-CM | POA: Diagnosis not present

## 2013-05-20 DIAGNOSIS — R531 Weakness: Secondary | ICD-10-CM | POA: Diagnosis present

## 2013-05-20 DIAGNOSIS — M6281 Muscle weakness (generalized): Secondary | ICD-10-CM | POA: Diagnosis not present

## 2013-05-20 DIAGNOSIS — R269 Unspecified abnormalities of gait and mobility: Secondary | ICD-10-CM | POA: Diagnosis not present

## 2013-05-20 DIAGNOSIS — Z7982 Long term (current) use of aspirin: Secondary | ICD-10-CM

## 2013-05-20 DIAGNOSIS — R5381 Other malaise: Secondary | ICD-10-CM | POA: Diagnosis not present

## 2013-05-20 DIAGNOSIS — I517 Cardiomegaly: Secondary | ICD-10-CM | POA: Diagnosis not present

## 2013-05-20 DIAGNOSIS — E860 Dehydration: Secondary | ICD-10-CM | POA: Diagnosis present

## 2013-05-20 DIAGNOSIS — R008 Other abnormalities of heart beat: Secondary | ICD-10-CM | POA: Diagnosis present

## 2013-05-20 DIAGNOSIS — Z9889 Other specified postprocedural states: Secondary | ICD-10-CM | POA: Diagnosis present

## 2013-05-20 DIAGNOSIS — N400 Enlarged prostate without lower urinary tract symptoms: Secondary | ICD-10-CM | POA: Diagnosis present

## 2013-05-20 DIAGNOSIS — G8194 Hemiplegia, unspecified affecting left nondominant side: Secondary | ICD-10-CM

## 2013-05-20 LAB — BASIC METABOLIC PANEL
BUN: 16 mg/dL (ref 6–23)
CALCIUM: 9.1 mg/dL (ref 8.4–10.5)
CO2: 23 mEq/L (ref 19–32)
Chloride: 99 mEq/L (ref 96–112)
Creatinine, Ser: 0.97 mg/dL (ref 0.50–1.35)
GFR, EST AFRICAN AMERICAN: 82 mL/min — AB (ref >90)
GFR, EST NON AFRICAN AMERICAN: 71 mL/min — AB (ref >90)
Glucose, Bld: 107 mg/dL — ABNORMAL HIGH (ref 70–99)
Potassium: 4.1 mEq/L (ref 3.7–5.3)
Sodium: 139 mEq/L (ref 137–147)

## 2013-05-20 LAB — URINALYSIS, ROUTINE W REFLEX MICROSCOPIC
Bilirubin Urine: NEGATIVE
GLUCOSE, UA: NEGATIVE mg/dL
Hgb urine dipstick: NEGATIVE
KETONES UR: 15 mg/dL — AB
Leukocytes, UA: NEGATIVE
Nitrite: NEGATIVE
Protein, ur: NEGATIVE mg/dL
Specific Gravity, Urine: 1.013 (ref 1.005–1.030)
Urobilinogen, UA: 0.2 mg/dL (ref 0.0–1.0)
pH: 6 (ref 5.0–8.0)

## 2013-05-20 LAB — POCT I-STAT TROPONIN I: Troponin i, poc: 0 ng/mL (ref 0.00–0.08)

## 2013-05-20 LAB — CG4 I-STAT (LACTIC ACID): Lactic Acid, Venous: 1.68 mmol/L (ref 0.5–2.2)

## 2013-05-20 LAB — HEPATIC FUNCTION PANEL
ALBUMIN: 3.7 g/dL (ref 3.5–5.2)
ALT: 15 U/L (ref 0–53)
AST: 31 U/L (ref 0–37)
Alkaline Phosphatase: 54 U/L (ref 39–117)
Bilirubin, Direct: <0.2 mg/dL (ref 0.0–0.3)
TOTAL PROTEIN: 7.2 g/dL (ref 6.0–8.3)
Total Bilirubin: 0.6 mg/dL (ref 0.3–1.2)

## 2013-05-20 LAB — CBC
HCT: 43.6 % (ref 39.0–52.0)
Hemoglobin: 15.8 g/dL (ref 13.0–17.0)
MCH: 33.2 pg (ref 26.0–34.0)
MCHC: 36.2 g/dL — AB (ref 30.0–36.0)
MCV: 91.6 fL (ref 78.0–100.0)
PLATELETS: 263 10*3/uL (ref 150–400)
RBC: 4.76 MIL/uL (ref 4.22–5.81)
RDW: 13.1 % (ref 11.5–15.5)
WBC: 20.3 10*3/uL — ABNORMAL HIGH (ref 4.0–10.5)

## 2013-05-20 LAB — MAGNESIUM: Magnesium: 1.9 mg/dL (ref 1.5–2.5)

## 2013-05-20 LAB — LIPASE, BLOOD: LIPASE: 25 U/L (ref 11–59)

## 2013-05-20 MED ORDER — SODIUM CHLORIDE 0.9 % IV BOLUS (SEPSIS): 1000.0000 mL | Freq: Once | INTRAVENOUS | Status: DC

## 2013-05-20 MED ORDER — SODIUM CHLORIDE 0.9 % IV BOLUS (SEPSIS)
1000.0000 mL | Freq: Once | INTRAVENOUS | Status: AC
  Administered 2013-05-20: 1000 mL via INTRAVENOUS

## 2013-05-20 NOTE — H&P (Signed)
PCP: Horton Finer, MD    Chief Complaint:  Weak all over, felt like being hit by a truck   HPI: Zachary [name] is a 80 y.o. male   has a past medical history of Prostate cancer (2000); PVD (peripheral vascular disease); Prostate enlargement; Insomnia; Pancreatitis; PVC's (premature ventricular contractions); Heart block; and Diabetes mellitus without complication.   Presented with  Patient reports that for the past 1 month he has been feeling tired and out of energy. Patient endorses diffuse aches all over and severe constipation. No BM for 3 days. Patient was brought to ER In ER he finally had a BM and also was noted to be retaining urine.  On the monitor patient noted to be in bigemini. Denies any chest pain, fever, no shortness of breath. States he has spoken to his PCP regarding his generalized fatigue and was told that this is age related.  CXR unremarkable. Denies significant weight loss. Patient with dry mucous membranes appears to go in bigemini with minimal exertion. [facility] called for admission. Patient describes long term dysuria.  When questioned further patient endorses leaning to the left side for few weeks.   Review of Systems:    Pertinent positives include: fatigue, constipation, muscle aches  Constitutional:  No weight loss, night sweats, Fevers, chills, fatigue, weight loss  HEENT:  No headaches, Difficulty swallowing,Tooth/dental problems,Sore throat,  No sneezing, itching, ear ache, nasal congestion, post nasal drip,  Cardio-vascular:  No chest pain, Orthopnea, PND, anasarca, dizziness, palpitations.no Bilateral lower extremity swelling  GI:  No heartburn, indigestion, abdominal pain, nausea, vomiting, diarrhea, change in bowel habits, loss of appetite, melena, blood in stool, hematemesis Resp:  no shortness of breath at rest. No dyspnea on exertion, No excess mucus, no productive cough, No non-productive cough, No coughing up of blood.No change in  color of mucus.No wheezing. Skin:  no rash or lesions. No jaundice GU:  no dysuria, change in color of urine, no urgency or frequency. No straining to urinate.  No flank pain.  Musculoskeletal:  No joint pain or no joint swelling. No decreased range of motion. No back pain.  Psych:  No change in mood or affect. No depression or anxiety. No memory loss.  Neuro: no localizing neurological complaints, no tingling, no weakness, no double vision, no gait abnormality, no slurred speech, no confusion  Otherwise ROS are negative except for above, 10 systems were reviewed  Past Medical History: Past Medical History  Diagnosis Date  . Prostate cancer 2000  . PVD (peripheral vascular disease)   . Prostate enlargement   . Insomnia   . Pancreatitis   . PVC's (premature ventricular contractions)   . Heart block   . Diabetes mellitus without complication    Past Surgical History  Procedure Laterality Date  . Cholecystectomy  07/18/2011    Procedure: LAPAROSCOPIC CHOLECYSTECTOMY;  Surgeon: Gwenyth Ober, MD;  Location: [facility];  Service: General;  Laterality: N/A;  . Prostatectomy       Medications: Prior to Admission medications   Medication Sig Start Date End Date Taking? Authorizing Provider  aspirin EC 81 MG tablet Take 81 mg by mouth daily.   Yes Historical Provider, MD  ergocalciferol (VITAMIN D2) 50000 UNITS capsule Take 50,000 Units by mouth once a week. Friday   Yes Historical Provider, MD  flurazepam (DALMANE) 30 MG capsule Take 30 mg by mouth at bedtime as needed. sleep   Yes Historical Provider, MD  glimepiride (AMARYL) 1 MG tablet Take 1 mg by  mouth daily before breakfast.   Yes Historical Provider, MD  Multiple Vitamin (MULTIVITAMIN WITH MINERALS) TABS Take 1 tablet by mouth daily.   Yes Historical Provider, MD  omeprazole (PRILOSEC) 20 MG capsule Take 20 mg by mouth daily.   Yes Historical Provider, MD  pentoxifylline (TRENTAL) 400 MG CR tablet TAKE 1 TABLET BY MOUTH THREE  TIMES A DAY WITH MEALS 05/12/13  Yes Casandra Doffing, MD  Tamsulosin HCl (FLOMAX) 0.4 MG CAPS Take 0.4 mg by mouth daily.    Yes Historical Provider, MD    Allergies:   Allergies  Allergen Reactions  . Penicillins Rash    Social History:  Ambulatory walker  Or cane Lives at  Home with wife   reports that he has quit smoking. He does not have any smokeless tobacco history on file. He reports that he does not drink alcohol or use illicit drugs.   Family History: family history includes Breast cancer in his daughter; Stroke in his father.    Physical Exam: Patient Vitals for the past 24 hrs:  BP Temp Temp src Pulse Resp SpO2  05/20/13 1951 188/81 mmHg - - 91 - -  05/20/13 1950 186/53 mmHg - - 88 - -  05/20/13 1814 170/80 mmHg 97.7 F (36.5 C) Oral - 16 97 %  05/20/13 1718 146/113 mmHg - - 82 16 95 %    1. General:  in No Acute distress 2. Psychological: Alert and   Oriented 3. Head/ENT:    Dry Mucous Membranes                          Head Non traumatic, neck supple                          NormalDentition 4. SKIN: decreased Skin turgor,  Skin clean Dry and intact no rash, sun damage noted 5. Heart: irregular rate and rhythm systolic mild Murmur, Rub or gallop 6. Lungs: Clear to auscultation bilaterally, no wheezes or crackles   7. Abdomen: Soft, non-tender, Non distended 8. Lower extremities: no clubbing, cyanosis, or edema 9. Neurologically 4/5 strength in left upper and lower ext with slight pronator, drift, CN 2-12 apear intact, finger to nose worse on the left. 10. MSK: Normal range of motion  body mass index is unknown because there is no weight on file.   Labs on Admission:   Recent Labs  05/20/13 1734 05/20/13 1850  NA 139  --   K 4.1  --   CL 99  --   CO2 23  --   GLUCOSE 107*  --   BUN 16  --   CREATININE 0.97  --   CALCIUM 9.1  --   MG  --  1.9    Recent Labs  05/20/13 1850  AST 31  ALT 15  ALKPHOS 54  BILITOT 0.6  PROT 7.2  ALBUMIN 3.7     Recent Labs  05/20/13 1850  LIPASE 25    Recent Labs  05/20/13 1734  WBC 20.3*  HGB 15.8  HCT 43.6  MCV 91.6  PLT 263   No results found for this basename: CKTOTAL, CKMB, CKMBINDEX, TROPONINI,  in the last 72 hours No results found for this basename: TSH, T4TOTAL, FREET3, T3FREE, THYROIDAB,  in the last 72 hours No results found for this basename: VITAMINB12, FOLATE, FERRITIN, TIBC, IRON, RETICCTPCT,  in the last 72 hours Lab Results  Component Value  Date   HGBA1C  Value: 7.1 (NOTE) The ADA recommends the following therapeutic goal for glycemic control related to Hgb A1c measurement: Goal of therapy: <6.5 Hgb A1c  Reference: American Diabetes Association: Clinical Practice Recommendations 2010, Diabetes Care, 2010, 33: (Suppl  1).* 12/16/2008    The CrCl is unknown because both a height and weight (above a minimum accepted value) are required for this calculation. ABG    Component Value Date/Time   TCO2 26 07/15/2011 0049     No results found for this basename: DDIMER     Other results:  I have pearsonaly reviewed this: ECG REPORT  Rate: 105   Rhythm: sinus tachycardia, ventricular bigemini ST&T Change: frequent PVC  Mg 1.9  Lipase 25 Lactic acid 1.68  UA no evidence of infection Troponin 0.00  Cultures:    Component Value Date/Time   SDES URINE, CLEAN CATCH 07/15/2011 1249   SPECREQUEST NONE 07/15/2011 1249   CULT NO GROWTH 07/15/2011 1249   REPTSTATUS 07/16/2011 FINAL 07/15/2011 1249       Radiological Exams on Admission: Dg Chest 2 View  05/20/2013   CLINICAL DATA:  Generalized weakness.  Shortness of breath.  EXAM: CHEST  2 VIEW  COMPARISON:  PA and lateral chest 05/15/2013.  FINDINGS: Lungs are clear. Heart size is normal. No pneumothorax or pleural effusion.  IMPRESSION: No acute disease.   Electronically Signed   By: Inge Rise M.D.   On: 05/20/2013 19:27    Chart has been reviewed  Assessment/Plan  80 yo M with hx of diffuse weakness  and possibly localized left size weakness for the past 2-3 weeks suggestive of CVA.   Present on Admission:  . Hypertension - continue home meds given frequent ectopy will add beta blocker . Pulsus bigeminis - Echo, metoprolol, tsh . Dehydration - IVF gently . Diabetes mellitus - SSI hold PO meds . Left hemiplegia -  - will admit based on TIA/CVA protocol, await results of MRA/MRI, Carotid Doppler and Echo, obtain cardiac enzymes,  ECG,   Lipid panel, TSH. Order PT/OT evaluation. Will make sure patient is on antiplatelet agent.  Neurology consult.     Generalized muscle aches - will check sed rate, ANA, CK, RF    Prophylaxis: SCD, Protonix  CODE STATUS: DNR/DNI as per patient  Other plan as per orders.  I have spent a total of 55 min on this admission  Zachary [name] 05/20/2013, 9:24 PM

## 2013-05-20 NOTE — ED Notes (Signed)
Report given to 3W; EDT going to CT to transfer patient to floor after scan.

## 2013-05-20 NOTE — ED Notes (Addendum)
Pt c/o weakness increasingly more severe over past 3 days. He states his whole body aches. Also hes had a dry cough. hes also been constipated

## 2013-05-20 NOTE — ED Notes (Addendum)
Wife home phone # [phone]

## 2013-05-20 NOTE — ED Provider Notes (Signed)
CSN: [id number]     Arrival date & time 05/20/13  1648 History   First MD Initiated Contact with Patient 05/20/13 1801     Chief Complaint  Patient presents with  . Weakness   (Consider location/radiation/quality/duration/timing/severity/associated sxs/prior Treatment) The history is provided by the patient. No language interpreter was used.  Zachary [name] is an 80 year old male with past medical history of prostate cancer with prostatectomy performed approximately 12 years ago, pancreatitis, PVCs, heart block presenting to emergency department with ongoing weakness for the past 2-3 weeks. Patient is with family at bedside. Patient reported that he feels "very very very weak" and that he feels this way all the time. Stated that he has been sleeping fine. Reported that he continuously feels weak everyday. Reported that when he walks up steps by the time he gets to the top of the steps he feels fatigued. Reported he walks around with a walker. Stated that he has not had a proper bowel movement within the past 2 days has been feeling abdominal fullness and lower back pain. Denied fever, chest pain, shortness of breath, difficulty breathing, nausea, vomiting, changes to behavior. Family denied altered mental status. Patient lives at home with wife, uses a cane to normally get around.   PCP Dr. Louie Casa   Past Medical History  Diagnosis Date  . Prostate cancer 2000  . PVD (peripheral vascular disease)   . Prostate enlargement   . Insomnia   . Pancreatitis   . PVC's (premature ventricular contractions)   . Heart block    Past Surgical History  Procedure Laterality Date  . Cholecystectomy  07/18/2011    Procedure: LAPAROSCOPIC CHOLECYSTECTOMY;  Surgeon: Gwenyth Ober, MD;  Location: [facility] OR;  Service: General;  Laterality: N/A;  . Prostatectomy     Family History  Problem Relation Age of Onset  . Breast cancer Daughter    History  Substance Use Topics  . Smoking status: Former Smoker  -- 20 years  . Smokeless tobacco: Not on file  . Alcohol Use: No    Review of Systems  Constitutional: Negative for fever and chills.  Eyes: Negative for visual disturbance.  Respiratory: Negative for cough, chest tightness and shortness of breath.   Cardiovascular: Negative for chest pain.  Gastrointestinal: Positive for constipation. Negative for nausea, vomiting, abdominal pain, diarrhea, blood in stool and anal bleeding.  Genitourinary: Negative for dysuria and decreased urine volume.  Neurological: Positive for weakness. Negative for dizziness and light-headedness.  All other systems reviewed and are negative.    Allergies  Penicillins  Home Medications   Current Outpatient Rx  Name  Route  Sig  Dispense  Refill  . aspirin EC 81 MG tablet   Oral   Take 81 mg by mouth daily.         . ergocalciferol (VITAMIN D2) 50000 UNITS capsule   Oral   Take 50,000 Units by mouth once a week. Friday         . flurazepam (DALMANE) 30 MG capsule   Oral   Take 30 mg by mouth at bedtime as needed. sleep         . glimepiride (AMARYL) 1 MG tablet   Oral   Take 1 mg by mouth daily before breakfast.         . Multiple Vitamin (MULTIVITAMIN WITH MINERALS) TABS   Oral   Take 1 tablet by mouth daily.         Marland Kitchen omeprazole (PRILOSEC) 20 MG  capsule   Oral   Take 20 mg by mouth daily.         . pentoxifylline (TRENTAL) 400 MG CR tablet      TAKE 1 TABLET BY MOUTH THREE TIMES A DAY WITH MEALS   90 tablet   11   . Tamsulosin HCl (FLOMAX) 0.4 MG CAPS   Oral   Take 0.4 mg by mouth daily.           BP 188/81  Pulse 91  Temp(Src) 97.7 F (36.5 C) (Oral)  Resp 16  SpO2 97% Physical Exam  Nursing note and vitals reviewed. Constitutional: He is oriented to person, place, and time. He appears well-developed and well-nourished. No distress.  HENT:  Head: Normocephalic and atraumatic.  Mouth/Throat: Oropharynx is clear and moist. No oropharyngeal exudate.  Bilateral  cerumen impaction  Eyes: Conjunctivae and EOM are normal. Pupils are equal, round, and reactive to light. Right eye exhibits no discharge. Left eye exhibits no discharge.  Neck: Normal range of motion. Neck supple. No tracheal deviation present.  Cardiovascular: Normal rate, regular rhythm and normal heart sounds.  Exam reveals no friction rub.   No murmur heard. Pulses:      Radial pulses are 2+ on the right side, and 2+ on the left side.       Dorsalis pedis pulses are 2+ on the right side, and 2+ on the left side.  Cap refill less than 3 seconds Negative swelling or erythema noted to the bilateral lower extremities-negative signs of pitting edema  Pulmonary/Chest: Effort normal. No respiratory distress. He has no wheezes. He has no rales.  Patient is able to speak in full sentences without difficulty Negative use of accessory muscles Decreased breath sounds to upper and lower lobes bilaterally  Abdominal: Soft. Bowel sounds are normal. There is no tenderness. There is no guarding.  Musculoskeletal: Normal range of motion.  Full ROM to upper and lower extremities without difficulty noted, negative ataxia noted.  Neurological: He is alert and oriented to person, place, and time. No cranial nerve deficit. He exhibits normal muscle tone. Coordination normal.  Cranial nerves III-XII grossly intact Strength 5+/5+ to upper and lower extremities bilaterally with resistance applied, equal distribution noted Strength intact to digits of the feet bilaterally Sensation intact to upper and lower extremities Patient is able to bring finger to nose without difficulty or ataxia Equal grip strength Negative arm drift Patient is able to follow commands properly Negative facial drooping Negative slurred speech  Skin: Skin is warm and dry. No rash noted. He is not diaphoretic. No erythema.  Psychiatric: He has a normal mood and affect. His behavior is normal. Thought content normal.    ED Course   Procedures (including critical care time)  7:33 PM Tech tried to attempt orthostatics when standing patient up patient when into bigeminy. New onset bigeminy noted. During this time with the episode of bigeminy patient became extremely weak and felt like his legs were going to give out.  8:09 PM Attending physician discussed case with Cardiology, Dr. Georgina Peer - who reported that this is not a Cardiology issue that this is more of a Internal Medicine admit.   8:32 PM This provider spoke with Dr. Roel Cluck, [facility] - discussed case, history, presentation, labs, imaging in great detail. Recommended patient to be hydrated. Recommended orthostatics to be performed and for patient to be monitored. [facility] to see patient and make decision whether to admit or not.   9:57 PM Dr. Roel Cluck  saw and assessed the patient. Reported very subtle weakness localized to the left side of the upper and lower extremities. Reported that CVA must be ruled out. Orders have been placed - CT head, MRA/MRI of brain - as per recommended by Dr. Roel Cluck. Patient to be admitted to Telemetry as inpatient. Recommended Neurology consult to be performed.   10:05 PM Patient passed swallow screen.   10:41 PM This provider spoke with Dr. Doy Mince from Neurology - discussed case, history, labs. Neurology to consult.   Results for orders placed during the hospital encounter of 05/20/13  CBC      Result Value Range   WBC 20.3 (*) 4.0 - 10.5 K/uL   RBC 4.76  4.22 - 5.81 MIL/uL   Hemoglobin 15.8  13.0 - 17.0 g/dL   HCT 43.6  39.0 - 52.0 %   MCV 91.6  78.0 - 100.0 fL   MCH 33.2  26.0 - 34.0 pg   MCHC 36.2 (*) 30.0 - 36.0 g/dL   RDW 13.1  11.5 - 15.5 %   Platelets 263  150 - 400 K/uL  BASIC METABOLIC PANEL      Result Value Range   Sodium 139  137 - 147 mEq/L   Potassium 4.1  3.7 - 5.3 mEq/L   Chloride 99  96 - 112 mEq/L   CO2 23  19 - 32 mEq/L   Glucose, Bld 107 (*) 70 - 99 mg/dL   BUN 16  6 - 23 mg/dL   Creatinine, Ser  0.97  0.50 - 1.35 mg/dL   Calcium 9.1  8.4 - 10.5 mg/dL   GFR calc non Af Amer 71 (*) >90 mL/min   GFR calc Af Amer 82 (*) >90 mL/min  URINALYSIS, ROUTINE W REFLEX MICROSCOPIC      Result Value Range   Color, Urine YELLOW  YELLOW   APPearance CLEAR  CLEAR   Specific Gravity, Urine 1.013  1.005 - 1.030   pH 6.0  5.0 - 8.0   Glucose, UA NEGATIVE  NEGATIVE mg/dL   Hgb urine dipstick NEGATIVE  NEGATIVE   Bilirubin Urine NEGATIVE  NEGATIVE   Ketones, ur 15 (*) NEGATIVE mg/dL   Protein, ur NEGATIVE  NEGATIVE mg/dL   Urobilinogen, UA 0.2  0.0 - 1.0 mg/dL   Nitrite NEGATIVE  NEGATIVE   Leukocytes, UA NEGATIVE  NEGATIVE  LIPASE, BLOOD      Result Value Range   Lipase 25  11 - 59 U/L  MAGNESIUM      Result Value Range   Magnesium 1.9  1.5 - 2.5 mg/dL  HEPATIC FUNCTION PANEL      Result Value Range   Total Protein 7.2  6.0 - 8.3 g/dL   Albumin 3.7  3.5 - 5.2 g/dL   AST 31  0 - 37 U/L   ALT 15  0 - 53 U/L   Alkaline Phosphatase 54  39 - 117 U/L   Total Bilirubin 0.6  0.3 - 1.2 mg/dL   Bilirubin, Direct <0.2  0.0 - 0.3 mg/dL   Indirect Bilirubin NOT CALCULATED  0.3 - 0.9 mg/dL  POCT I-STAT TROPONIN I      Result Value Range   Troponin i, poc 0.00  0.00 - 0.08 ng/mL   Comment 3           CG4 I-STAT (LACTIC ACID)      Result Value Range   Lactic Acid, Venous 1.68  0.5 - 2.2 mmol/L   Dg Chest 2 View  05/20/2013   CLINICAL DATA:  Generalized weakness.  Shortness of breath.  EXAM: CHEST  2 VIEW  COMPARISON:  PA and lateral chest 05/15/2013.  FINDINGS: Lungs are clear. Heart size is normal. No pneumothorax or pleural effusion.  IMPRESSION: No acute disease.   Electronically Signed   By: Inge Rise M.D.   On: 05/20/2013 19:27    Labs Review Labs Reviewed  CBC - Abnormal; Notable for the following:    WBC 20.3 (*)    MCHC 36.2 (*)    All other components within normal limits  BASIC METABOLIC PANEL - Abnormal; Notable for the following:    Glucose, Bld 107 (*)    GFR calc non Af  Amer 71 (*)    GFR calc Af Amer 82 (*)    All other components within normal limits  LIPASE, BLOOD  MAGNESIUM  HEPATIC FUNCTION PANEL  URINALYSIS, ROUTINE W REFLEX MICROSCOPIC  POCT I-STAT TROPONIN I  CG4 I-STAT (LACTIC ACID)   Imaging Review Dg Chest 2 View  05/20/2013   CLINICAL DATA:  Generalized weakness.  Shortness of breath.  EXAM: CHEST  2 VIEW  COMPARISON:  PA and lateral chest 05/15/2013.  FINDINGS: Lungs are clear. Heart size is normal. No pneumothorax or pleural effusion.  IMPRESSION: No acute disease.   Electronically Signed   By: Inge Rise M.D.   On: 05/20/2013 19:27    EKG Interpretation    Date/Time:  Sunday May 20 2013 17:19:02 EST Ventricular Rate:  92 PR Interval:  194 QRS Duration: 86 QT Interval:  362 QTC Calculation: 447 R Axis:   -21 Text Interpretation:  Sinus rhythm with frequent Premature ventricular complexes Minimal voltage criteria for LVH, may be normal variant Nonspecific ST abnormality Abnormal ECG Since last tracing Premature ventricular complexes More frequent Confirmed by SHELDON  MD, CHARLES ([id number]) on 05/20/2013 5:27:05 PM            MDM   1. Weakness    Medications  sodium chloride 0.9 % bolus 1,000 mL (0 mLs Intravenous Stopped 05/20/13 2000)   Filed Vitals:   05/20/13 1950 05/20/13 1951 05/20/13 2000 05/20/13 2100  BP: 186/53 188/81 133/115 182/99  Pulse: 88 91 79 91  Temp:      TempSrc:      Resp:   26 15  SpO2:   99% 94%    Patient presenting to emergency department with feelings of weakness that have been ongoing for the past 2-3 weeks. Stated that when he walks up steps, by the time he gets to the top steps he is fatigued-denied being out of breath. Stated he has not had a bowel movement in the past 2 days. Alert and oriented. GCS 15. Heart rate and rhythm normal. Lungs noted to have decreased breath sounds to upper and lower lobes bilaterally. Negative respiratory distress, negative use of accessory muscles. Radial  and DP pulses 2+ bilaterally. Cap refill less than 3 seconds. Negative swelling or pitting edema noted to bilateral lower extremities. Full range of motion noted to upper and lower extremities without ataxia or difficulty. Strength intact upper and lower extremities bilaterally without difficulty, equal distribution noted. Equal grip strength. Sensation intact. Negative slurred speech of facial drooping noted. Visual fields intact. Negative neck stiffness, negative nuchal rigidity. Negative meningeal signs. Proper coordination - patient able to bring finger to nose without difficulty or ataxia.  While orthostatics were being collected when going from sitting to standing patient had an episode of new onset bigeminy with  associated weakness. Patient was unable to stand up on his feet, reported that if he did stand on his feet he would fall because his legs felt weak. Patient normally walks with a walker.  EKG noted normal sinus rhythm at 92 beats per minute with frequent premature ventricular complexes and nonspecific ST abnormalities-when compared to last EKG premature atrial complexes are more frequent. Negative i-STAT troponin elevation. CBC noted mild elevated white blood cell count of 20.3. BMP negative findings. Hepatic function panel negative findings. Magnesium negative elevation. Lactic acid negative elevation-1.68. UA negative for acute infection - negative nitrites.  Patient was seen and assessed by [facility], Dr. Roel Cluck who reported very subtle left sided weakness - reported CVA work-up to be performed. Recommended MRA/MRI to be performed as per requested by admitting physician. Recommended Neurology consult.  Negative urinary infection. Negative pneumonia. Patient presenting with bigeminy - new onset - with associated weakness. Patient has been having weakness for the past 2 weeks. Has not been taking ASA as prescribed. Admitting physician recommended CVA work-up to be performed. Patient to  be admitted to the hospital to Telemetry for weaknes, CVA rule out. This provider spoke with Neurology, Neurology to consult. Orders has been placed. Patient stable for transfer.   Jamse Mead, PA-C 05/21/13 1159

## 2013-05-20 NOTE — ED Notes (Signed)
Hospitalist, MD at bedside. 

## 2013-05-20 NOTE — ED Notes (Signed)
Patient transported to CT 

## 2013-05-20 NOTE — ED Notes (Signed)
Unable to get standing B/P. Orthostatic weekness

## 2013-05-21 DIAGNOSIS — R5381 Other malaise: Secondary | ICD-10-CM

## 2013-05-21 DIAGNOSIS — I517 Cardiomegaly: Secondary | ICD-10-CM

## 2013-05-21 DIAGNOSIS — R5383 Other fatigue: Secondary | ICD-10-CM

## 2013-05-21 LAB — GLUCOSE, CAPILLARY
GLUCOSE-CAPILLARY: 144 mg/dL — AB (ref 70–99)
GLUCOSE-CAPILLARY: 159 mg/dL — AB (ref 70–99)
GLUCOSE-CAPILLARY: 155 mg/dL — AB (ref 70–99)
Glucose-Capillary: 102 mg/dL — ABNORMAL HIGH (ref 70–99)
Glucose-Capillary: 118 mg/dL — ABNORMAL HIGH (ref 70–99)
Glucose-Capillary: 120 mg/dL — ABNORMAL HIGH (ref 70–99)

## 2013-05-21 LAB — DIFFERENTIAL
Basophils Absolute: 0 10*3/uL (ref 0.0–0.1)
Basophils Relative: 0 % (ref 0–1)
EOS PCT: 1 % (ref 0–5)
Eosinophils Absolute: 0.2 10*3/uL (ref 0.0–0.7)
LYMPHS PCT: 15 % (ref 12–46)
Lymphs Abs: 2.8 10*3/uL (ref 0.7–4.0)
MONO ABS: 2.2 10*3/uL — AB (ref 0.1–1.0)
MONOS PCT: 12 % (ref 3–12)
NEUTROS ABS: 13.5 10*3/uL — AB (ref 1.7–7.7)
Neutrophils Relative %: 72 % (ref 43–77)

## 2013-05-21 LAB — CBC WITH DIFFERENTIAL/PLATELET
BASOS PCT: 0 % (ref 0–1)
Basophils Absolute: 0 10*3/uL (ref 0.0–0.1)
Eosinophils Absolute: 0.3 10*3/uL (ref 0.0–0.7)
Eosinophils Relative: 1 % (ref 0–5)
HCT: 43.2 % (ref 39.0–52.0)
Hemoglobin: 14.9 g/dL (ref 13.0–17.0)
Lymphocytes Relative: 17 % (ref 12–46)
Lymphs Abs: 3.1 10*3/uL (ref 0.7–4.0)
MCH: 31.7 pg (ref 26.0–34.0)
MCHC: 34.5 g/dL (ref 30.0–36.0)
MCV: 91.9 fL (ref 78.0–100.0)
Monocytes Absolute: 1.9 10*3/uL — ABNORMAL HIGH (ref 0.1–1.0)
Monocytes Relative: 10 % (ref 3–12)
NEUTROS PCT: 72 % (ref 43–77)
Neutro Abs: 13.1 10*3/uL — ABNORMAL HIGH (ref 1.7–7.7)
PLATELETS: 238 10*3/uL (ref 150–400)
RBC: 4.7 MIL/uL (ref 4.22–5.81)
RDW: 12.9 % (ref 11.5–15.5)
WBC: 18.3 10*3/uL — ABNORMAL HIGH (ref 4.0–10.5)

## 2013-05-21 LAB — PROTIME-INR
INR: 0.94 (ref 0.00–1.49)
PROTHROMBIN TIME: 12.4 s (ref 11.6–15.2)

## 2013-05-21 LAB — SEDIMENTATION RATE: SED RATE: 4 mm/h (ref 0–16)

## 2013-05-21 LAB — C-REACTIVE PROTEIN: CRP: 2.6 mg/dL — AB (ref <0.60)

## 2013-05-21 LAB — LIPID PANEL
CHOL/HDL RATIO: 5.8 ratio
Cholesterol: 186 mg/dL (ref 0–200)
HDL: 32 mg/dL — AB (ref >39)
LDL CALC: 128 mg/dL — AB (ref 0–99)
Triglycerides: 128 mg/dL (ref <150)
VLDL: 26 mg/dL (ref 0–40)

## 2013-05-21 LAB — TROPONIN I
Troponin I: <0.3 ng/mL (ref <0.30)
Troponin I: <0.3 ng/mL (ref <0.30)
Troponin I: <0.3 ng/mL (ref <0.30)

## 2013-05-21 LAB — CK: CK TOTAL: 621 U/L — AB (ref 7–232)

## 2013-05-21 LAB — TSH: TSH: 2.258 u[IU]/mL (ref 0.350–4.500)

## 2013-05-21 LAB — HEMOGLOBIN A1C
Hgb A1c MFr Bld: 6.8 % — ABNORMAL HIGH (ref <5.7)
Mean Plasma Glucose: 148 mg/dL — ABNORMAL HIGH (ref <117)

## 2013-05-21 LAB — RHEUMATOID FACTOR: RHEUMATOID FACTOR: 12 [IU]/mL (ref <=14)

## 2013-05-21 LAB — APTT: aPTT: 31 seconds (ref 24–37)

## 2013-05-21 MED ORDER — LABETALOL HCL 5 MG/ML IV SOLN
10.0000 mg | INTRAVENOUS | Status: DC | PRN
  Filled 2013-05-21: qty 4

## 2013-05-21 MED ORDER — DOCUSATE SODIUM 100 MG PO CAPS
100.0000 mg | ORAL_CAPSULE | Freq: Two times a day (BID) | ORAL | Status: DC
  Administered 2013-05-21 – 2013-05-30 (×13): 100 mg via ORAL
  Filled 2013-05-21 (×24): qty 1

## 2013-05-21 MED ORDER — ASPIRIN 325 MG PO TABS
325.0000 mg | ORAL_TABLET | Freq: Every day | ORAL | Status: DC
  Administered 2013-05-21 – 2013-05-30 (×9): 325 mg via ORAL
  Filled 2013-05-21 (×10): qty 1

## 2013-05-21 MED ORDER — POLYETHYLENE GLYCOL 3350 17 G PO PACK
17.0000 g | PACK | Freq: Every day | ORAL | Status: DC | PRN
  Filled 2013-05-21: qty 1

## 2013-05-21 MED ORDER — METOPROLOL TARTRATE 12.5 MG HALF TABLET
12.5000 mg | ORAL_TABLET | Freq: Two times a day (BID) | ORAL | Status: DC
  Administered 2013-05-21 – 2013-05-30 (×19): 12.5 mg via ORAL
  Filled 2013-05-21 (×23): qty 1

## 2013-05-21 MED ORDER — SODIUM CHLORIDE 0.9 % IV SOLN
INTRAVENOUS | Status: DC
  Administered 2013-05-21 – 2013-05-23 (×3): via INTRAVENOUS

## 2013-05-21 MED ORDER — PENTOXIFYLLINE ER 400 MG PO TBCR
400.0000 mg | EXTENDED_RELEASE_TABLET | Freq: Three times a day (TID) | ORAL | Status: DC
  Administered 2013-05-21 – 2013-05-30 (×28): 400 mg via ORAL
  Filled 2013-05-21 (×35): qty 1

## 2013-05-21 MED ORDER — ACETAMINOPHEN 650 MG RE SUPP: 650.0000 mg | RECTAL | Status: DC | PRN

## 2013-05-21 MED ORDER — TEMAZEPAM 15 MG PO CAPS: 15.0000 mg | ORAL_CAPSULE | Freq: Every evening | ORAL | Status: DC | PRN

## 2013-05-21 MED ORDER — PANTOPRAZOLE SODIUM 40 MG PO TBEC
40.0000 mg | DELAYED_RELEASE_TABLET | Freq: Every day | ORAL | Status: DC
  Administered 2013-05-21 – 2013-05-30 (×9): 40 mg via ORAL
  Filled 2013-05-21 (×7): qty 1

## 2013-05-21 MED ORDER — SENNOSIDES-DOCUSATE SODIUM 8.6-50 MG PO TABS
1.0000 | ORAL_TABLET | Freq: Every day | ORAL | Status: DC
  Administered 2013-05-21 – 2013-05-29 (×7): 1 via ORAL
  Filled 2013-05-21 (×14): qty 1

## 2013-05-21 MED ORDER — ASPIRIN 300 MG RE SUPP
300.0000 mg | Freq: Every day | RECTAL | Status: DC
  Filled 2013-05-21 (×9): qty 1

## 2013-05-21 MED ORDER — INSULIN ASPART 100 UNIT/ML ~~LOC~~ SOLN
0.0000 [IU] | SUBCUTANEOUS | Status: DC
  Administered 2013-05-21 (×3): 2 [IU] via SUBCUTANEOUS
  Administered 2013-05-22 (×2): 1 [IU] via SUBCUTANEOUS
  Administered 2013-05-22: 2 [IU] via SUBCUTANEOUS
  Administered 2013-05-22: 1 [IU] via SUBCUTANEOUS
  Administered 2013-05-22: 2 [IU] via SUBCUTANEOUS
  Administered 2013-05-23 (×2): 1 [IU] via SUBCUTANEOUS
  Administered 2013-05-23: 2 [IU] via SUBCUTANEOUS

## 2013-05-21 MED ORDER — STROKE: EARLY STAGES OF RECOVERY BOOK
Freq: Once | Status: AC
  Administered 2013-05-21: 22:00:00
  Filled 2013-05-21 (×2): qty 1

## 2013-05-21 MED ORDER — TAMSULOSIN HCL 0.4 MG PO CAPS
0.4000 mg | ORAL_CAPSULE | Freq: Every day | ORAL | Status: DC
  Administered 2013-05-21 – 2013-05-30 (×9): 0.4 mg via ORAL
  Filled 2013-05-21 (×10): qty 1

## 2013-05-21 MED ORDER — ACETAMINOPHEN 325 MG PO TABS: 650.0000 mg | ORAL_TABLET | ORAL | Status: DC | PRN

## 2013-05-21 NOTE — Progress Notes (Signed)
VASCULAR LAB PRELIMINARY  PRELIMINARY  PRELIMINARY  PRELIMINARY  Carotid duplexcompleted.    Preliminary report:  Right;:  Greater than 80% internal carotid artery stenosis.  Unable to evaluate the vertebral due to movement. Left : 40% to 59 % ICA stenosis upper end of scale by systolic velocities. Increase may possible be due in part to compensatory flow from the stenosis on the right. Vertebral artery flow could not be evaluated due to movement and snoring  Tari Lecount, RVS 05/21/2013, 2:01 PM

## 2013-05-21 NOTE — Progress Notes (Signed)
  Echocardiogram 2D Echocardiogram has been performed.  Zachary [name] FRANCES 05/21/2013, 2:03 PM

## 2013-05-21 NOTE — Progress Notes (Signed)
CMD called and reported the patient to be in bigeminy and trigeminy. MD made aware.

## 2013-05-21 NOTE — Progress Notes (Signed)
Patient seen and examined. Discussed with wife at bedside. Admitted earlier today for progressive weakness and constipation. Feels much better today after having a BM. Neurology is seeing him and MRI is pending. PT is recommending SNF so will request SW for placement assistance. Will transfer care to Dr. Maxwell Caul in the am. Will continue to follow today.  Domingo Mend, MD [facility] Pager: [phone]

## 2013-05-21 NOTE — ED Provider Notes (Signed)
Medical screening examination/treatment/procedure(s) were conducted as a shared visit with non-physician practitioner(s) and myself.  I personally evaluated the patient during the encounter.    Patient with progressive diffuse weakness over past few weeks to months. Noted to be in bigeminy while doing orthostatics. Could be getting poor perfusion from ventricular beats causing weakness. Will need inpatient admission and further testing.   Ephraim Hamburger, MD 05/21/13 [phone]

## 2013-05-21 NOTE — Consult Note (Signed)
Referring Physician: Roel Cluck    Chief Complaint: Weakness  HPI: Zachary [name] is an 80 y.o. male who reports that he has had a slowly progressive weakness that has occurred over the past 2 weeks.  It is now to the point that he is no longer going up stairs because he feels unsafe.  He feels unsafe to drive as well.  Has extensive difficulty getting up from the toilet.  Has generalized achiness.  Reports tingling in his fingertips that has been present for about a week.  Also reports that he has been constipated for about a week which is unusual for him.  He has not had any bladder issues.  When he walks feels as if his legs are bucking on him and he feels off balance.  He does not describe vertigo.    Date last known well: Unable to determine - 2 weeks ago Time last known well: Unable to determine tPA Given: No: Outside time window  Past Medical History  Diagnosis Date  . Prostate cancer 2000  . PVD (peripheral vascular disease)   . Prostate enlargement   . Insomnia   . Pancreatitis   . PVC's (premature ventricular contractions)   . Heart block   . Diabetes mellitus without complication     Past Surgical History  Procedure Laterality Date  . Cholecystectomy  07/18/2011    Procedure: LAPAROSCOPIC CHOLECYSTECTOMY;  Surgeon: Gwenyth Ober, MD;  Location: [facility] OR;  Service: General;  Laterality: N/A;  . Prostatectomy      Family History  Problem Relation Age of Onset  . Breast cancer Daughter   . Stroke Father    Social History:  reports that he has quit smoking. He does not have any smokeless tobacco history on file. He reports that he does not drink alcohol or use illicit drugs.  Allergies:  Allergies  Allergen Reactions  . Penicillins Rash    Medications:  I have reviewed the patient's current medications. Prior to Admission:  Prescriptions prior to admission  Medication Sig Dispense Refill  . aspirin EC 81 MG tablet Take 81 mg by mouth daily.      . ergocalciferol  (VITAMIN D2) 50000 UNITS capsule Take 50,000 Units by mouth once a week. Friday      . flurazepam (DALMANE) 30 MG capsule Take 30 mg by mouth at bedtime as needed. sleep      . glimepiride (AMARYL) 1 MG tablet Take 1 mg by mouth daily before breakfast.      . Multiple Vitamin (MULTIVITAMIN WITH MINERALS) TABS Take 1 tablet by mouth daily.      Marland Kitchen omeprazole (PRILOSEC) 20 MG capsule Take 20 mg by mouth daily.      . pentoxifylline (TRENTAL) 400 MG CR tablet TAKE 1 TABLET BY MOUTH THREE TIMES A DAY WITH MEALS  90 tablet  11  . Tamsulosin HCl (FLOMAX) 0.4 MG CAPS Take 0.4 mg by mouth daily.        Scheduled: . aspirin  300 mg Rectal Daily   Or  . aspirin  325 mg Oral Daily  . docusate sodium  100 mg Oral BID  . insulin aspart  0-9 Units Subcutaneous Q4H  . metoprolol tartrate  12.5 mg Oral BID  . pantoprazole  40 mg Oral Daily  . pentoxifylline  400 mg Oral TID WC  . senna-docusate  1 tablet Oral QHS  . tamsulosin  0.4 mg Oral Daily    ROS: History obtained from the patient  General  ROS: negative for - chills, fatigue, fever, night sweats, weight gain or weight loss Psychological ROS: negative for - behavioral disorder, hallucinations, memory difficulties, mood swings or suicidal ideation Ophthalmic ROS: blurry vision ENT ROS: poor hearing from the right ear Allergy and Immunology ROS: negative for - hives or itchy/watery eyes Hematological and Lymphatic ROS: negative for - bleeding problems, bruising or swollen lymph nodes Endocrine ROS: negative for - galactorrhea, hair pattern changes, polydipsia/polyuria or temperature intolerance Respiratory ROS: negative for - cough, hemoptysis, shortness of breath or wheezing Cardiovascular ROS: negative for - chest pain, dyspnea on exertion, edema or irregular heartbeat Gastrointestinal ROS: as noted in HPI Genito-Urinary ROS: negative for - dysuria, hematuria, incontinence or urinary frequency/urgency Musculoskeletal ROS: as noted in  HPI Neurological ROS: as noted in HPI Dermatological ROS: negative for rash and skin lesion changes  Physical Examination: Blood pressure 157/96, pulse 71, temperature 98.1 F (36.7 C), temperature source Oral, resp. rate 22, height _0  (1.778 m), weight 94.076 kg (207 lb 6.4 oz), SpO2 96.00%.  Neurologic Examination: Mental Status: Alert, oriented, thought content appropriate.  Speech fluent without evidence of aphasia.  Able to follow 3 step commands without difficulty. Cranial Nerves: II: Discs flat bilaterally; Visual fields grossly normal, Left pupil round, reactive to light and accommodation.  Right pupil irregular but reactive to light III,IV, VI: ptosis not present, extra-ocular motions intact bilaterally V,VII: smile symmetric, facial light touch sensation normal bilaterally VIII: hearing normal bilaterally IX,X: gag reflex present XI: bilateral shoulder shrug XII: midline tongue extension Motor: Right : Upper extremity   5/5    Left:     Upper extremity   5/5 with 5-/5 hand grip  Lower extremity   5/5     Lower extremity   5/5 Tone and bulk:normal tone throughout; no atrophy noted Sensory: Pinprick and light touch intact throughout, bilaterally Deep Tendon Reflexes: 1+ in the upper extremities and at the knees.  Absent AJ's bilaterally. Plantars: Right: mute   Left: mute Cerebellar: Normal finger-to-nose bilaterally.  Normal heel-to-shin testing with the RLE, dysmetria with testing on the left Gait: Unable to test safely. CV: pulses palpable throughout     Laboratory Studies:  Basic Metabolic Panel:  Recent Labs Lab 05/20/13 1734 05/20/13 1850  NA 139  --   K 4.1  --   CL 99  --   CO2 23  --   GLUCOSE 107*  --   BUN 16  --   CREATININE 0.97  --   CALCIUM 9.1  --   MG  --  1.9    Liver Function Tests:  Recent Labs Lab 05/20/13 1850  AST 31  ALT 15  ALKPHOS 54  BILITOT 0.6  PROT 7.2  ALBUMIN 3.7    Recent Labs Lab 05/20/13 1850  LIPASE 25    No results found for this basename: AMMONIA,  in the last 168 hours  CBC:  Recent Labs Lab 05/20/13 1734  WBC 20.3*  HGB 15.8  HCT 43.6  MCV 91.6  PLT 263    Cardiac Enzymes: No results found for this basename: CKTOTAL, CKMB, CKMBINDEX, TROPONINI,  in the last 168 hours  BNP: No components found with this basename: POCBNP,   CBG:  Recent Labs Lab 05/21/13 0024  GLUCAP 120*    Microbiology: Results for orders placed during the hospital encounter of 07/15/11  MRSA PCR SCREENING     Status: None   Collection Time    07/15/11  4:23 AM  Result Value Range Status   MRSA by PCR NEGATIVE  NEGATIVE Final   Comment:            The GeneXpert MRSA Assay (FDA     approved for NASAL specimens     only), is one component of a     comprehensive MRSA colonization     surveillance program. It is not     intended to diagnose MRSA     infection nor to guide or     monitor treatment for     MRSA infections.  URINE CULTURE     Status: None   Collection Time    07/15/11 12:49 PM      Result Value Range Status   Specimen Description URINE, CLEAN CATCH   Final   Special Requests NONE   Final   Culture  Setup Time [id number]   Final   Colony Count NO GROWTH   Final   Culture NO GROWTH   Final   Report Status 07/16/2011 FINAL   Final    Coagulation Studies: No results found for this basename: LABPROT, INR,  in the last 72 hours  Urinalysis:  Recent Labs Lab 05/20/13 2040  COLORURINE YELLOW  LABSPEC 1.013  PHURINE 6.0  GLUCOSEU NEGATIVE  HGBUR NEGATIVE  BILIRUBINUR NEGATIVE  KETONESUR 15*  PROTEINUR NEGATIVE  UROBILINOGEN 0.2  NITRITE NEGATIVE  LEUKOCYTESUR NEGATIVE    Lipid Panel:    Component Value Date/Time   CHOL  Value: 164        ATP III CLASSIFICATION:  <200     mg/dL   Desirable  200-239  mg/dL   Borderline High  >=240    mg/dL   High        12/18/2008 0430   TRIG 82 12/18/2008 0430   HDL 32* 12/18/2008 0430   CHOLHDL 5.1 12/18/2008 0430   VLDL 16  12/18/2008 0430   LDLCALC  Value: 116        Total Cholesterol/HDL:CHD Risk Coronary Heart Disease Risk Table                     Men   Women  1/2 Average Risk   3.4   3.3  Average Risk       5.0   4.4  2 X Average Risk   9.6   7.1  3 X Average Risk  23.4   11.0        Use the calculated Patient Ratio above and the CHD Risk Table to determine the patient's CHD Risk.        ATP III CLASSIFICATION (LDL):  <100     mg/dL   Optimal  100-129  mg/dL   Near or Above                    Optimal  130-159  mg/dL   Borderline  160-189  mg/dL   High  >190     mg/dL   Very High* 12/18/2008 0430    HgbA1C:  Lab Results  Component Value Date   HGBA1C  Value: 7.1 (NOTE) The ADA recommends the following therapeutic goal for glycemic control related to Hgb A1c measurement: Goal of therapy: <6.5 Hgb A1c  Reference: American Diabetes Association: Clinical Practice Recommendations 2010, Diabetes Care, 2010, 33: (Suppl  1).* 12/16/2008    Urine Drug Screen:   No results found for this basename: labopia, cocainscrnur, labbenz, amphetmu, thcu, labbarb    Alcohol Level: No results found  for this basename: ETH,  in the last 168 hours  Other results: EKG: sinus tachycardia at 105bpm with ventricular bigeminy.  Imaging: Dg Chest 2 View  05/20/2013   CLINICAL DATA:  Generalized weakness.  Shortness of breath.  EXAM: CHEST  2 VIEW  COMPARISON:  PA and lateral chest 05/15/2013.  FINDINGS: Lungs are clear. Heart size is normal. No pneumothorax or pleural effusion.  IMPRESSION: No acute disease.   Electronically Signed   By: Inge Rise M.D.   On: 05/20/2013 19:27   Ct Head Wo Contrast  05/20/2013   CLINICAL DATA:  Weakness worsening over 3 days. Cough, constipation and body aches.  EXAM: CT HEAD WITHOUT CONTRAST  TECHNIQUE: Contiguous axial images were obtained from the base of the skull through the vertex without intravenous contrast.  COMPARISON:  MRI of the brain May 01, 2012  FINDINGS: Moderate ventriculomegaly, out of  proportion of the degree of sulcal enlargement which is mildly effaced at the convexities. No intraparenchymal hemorrhage, mass effect nor midline shift. Confluent supratentorial white matter hypodensities are within normal range for patient's age and though non-specific suggest sequelae of chronic small vessel ischemic disease. No acute large vascular territory infarcts. Remote right basal ganglia globus pallidus lacunar infarct.  No abnormal extra-axial fluid collections. Basal cisterns are patent. Moderate calcific atherosclerosis of the carotid siphons and included vertebral arteries.  No skull fracture. Trace sphenoid ethmoidal mucosal thickening without paranasal sinus air-fluid levels. The mastoid air cells are well aerated. Severe left temporomandibular osteoarthrosis. Soft tissue within the external auditory canals may reflect cerumen. The included ocular globes and orbital contents are non-suspicious. Status post bilateral ocular lens implants.  IMPRESSION: No acute intracranial process.  Mild sulcal effacement of the convexities, disproportionate to the degree of sulcal enlargement can be seen with normal pressure hydrocephalus.  Moderate to severe white matter changes suggest chronic small vessel ischemic disease with remote right basal ganglia lacunar infarct, similar.   Electronically Signed   By: Elon Alas   On: 05/20/2013 23:25    Assessment: 80 y.o. male presenting with complaints of generalized weakness that has been progressively worsening.  There are some mild focal findings on examination but nothing that should give such a dramatic functional decline.  Would also consider such entities as polymyositis, MG, etc.  Stroke Risk Factors - diabetes mellitus  Plan: 1. HgbA1c, fasting lipid panel, ESR. Myasthenia panel, CRP, repeat CBC, TSH, CK 2. MRI, MRA  of the brain without contrast 3. PT consult, OT consult, Speech consult 4. Echocardiogram 5. Prophylactic therapy-continue  ASA 6. Telemetry monitoring 7. Frequent neuro checks   Alexis Goodell, MD [facility] [phone] 05/21/2013, 12:36 AM

## 2013-05-21 NOTE — Evaluation (Signed)
Physical Therapy Evaluation Patient Details Name: Zachary [name] MRN: [id number] DOB: [birth year] Today's Date: 05/21/2013 Time: 4970-2637 PT Time Calculation (min): 23 min  PT Assessment / Plan / Recommendation History of Present Illness  Patient reports that for the past 1 month he has been feeling tired and out of energy. Patient endorses diffuse aches all over and severe constipation. No BM for 3 days. Patient was brought to ER In ER he finally had a BM and also was noted to be retaining urine.  On the monitor patient noted to be in bigemini. Denies any chest pain, fever, no shortness of breath. States he has spoken to his PCP regarding his generalized fatigue and was told that this is age related.  Clinical Impression  Pt presents with significant weakness, requiring assist with mobility and transfers, will benefit from skilled acute PT to address deficits and increase functional independence.      PT Assessment  Patient needs continued PT services    Follow Up Recommendations  SNF    Does the patient have the potential to tolerate intense rehabilitation      Barriers to Discharge Decreased caregiver support;Inaccessible home environment stairs to enter, wife is only available help    Equipment Recommendations  None recommended by PT    Recommendations for Other Services     Frequency Min 3X/week    Precautions / Restrictions Precautions Precautions: Fall Restrictions Weight Bearing Restrictions: No   Pertinent Vitals/Pain No c/o pain      Mobility  Bed Mobility Overal bed mobility: Needs Assistance Bed Mobility: Sidelying to Sit;Sit to Supine Sidelying to sit: Min assist Sit to supine: Min assist General bed mobility comments: assist for trunk and LEs, using bed rails, cues for sequencing.  Pt requires total A to scoot up in bed Transfers Overall transfer level: Needs assistance Equipment used: Rolling walker (2 wheeled) Transfers: Stand Pivot Transfers Stand  pivot transfers: Mod assist General transfer comment: assist to lift and assist for wt shifts to advance B LEs.  Pt transfered to [facility] and back to bed with cues for hand placement and lifting assist.  Pt states "I just lost all my strength"    Exercises     PT Diagnosis: Difficulty walking;Generalized weakness  PT Problem List: Decreased strength;Decreased mobility;Decreased activity tolerance;Decreased balance;Decreased knowledge of use of DME;Cardiopulmonary status limiting activity PT Treatment Interventions: DME instruction;Therapeutic exercise;Wheelchair mobility training;Gait training;Balance training;Stair training;Neuromuscular re-education;Modalities;Functional mobility training;Therapeutic activities;Patient/family education     PT Goals(Current goals can be found in the care plan section) Acute Rehab PT Goals Patient Stated Goal: go home PT Goal Formulation: With patient Time For Goal Achievement: 06/04/13 Potential to Achieve Goals: Good  Visit Information  Last PT Received On: 05/21/13 Assistance Needed: +1 History of Present Illness: Patient reports that for the past 1 month he has been feeling tired and out of energy. Patient endorses diffuse aches all over and severe constipation. No BM for 3 days. Patient was brought to ER In ER he finally had a BM and also was noted to be retaining urine.  On the monitor patient noted to be in bigemini. Denies any chest pain, fever, no shortness of breath. States he has spoken to his PCP regarding his generalized fatigue and was told that this is age related.       Prior [place] expects to be discharged to:: Private residence Living Arrangements: Spouse/significant other Available Help at Discharge: Family Type of Home: House Home Access: Stairs to enter  Entrance Stairs-Number of Steps: 4 Entrance Stairs-Rails: Right Home Layout: Two level Home Equipment: Cane - single point Prior Function Level of  Independence: Independent Communication Communication: No difficulties    Cognition  Cognition Arousal/Alertness: Awake/alert Behavior During Therapy: WFL for tasks assessed/performed Overall Cognitive Status: Within Functional Limits for tasks assessed    Extremity/Trunk Assessment Upper Extremity Assessment Upper Extremity Assessment: Generalized weakness Lower Extremity Assessment Lower Extremity Assessment: Generalized weakness Cervical / Trunk Assessment Cervical / Trunk Assessment: Kyphotic   Balance Balance Overall balance assessment: Needs assistance Sitting-balance support: Bilateral upper extremity supported Sitting balance-Leahy Scale: Poor Sitting balance - Comments: needs min A to sit EOB x 2 minutes Postural control: Posterior lean;Right lateral lean  End of Session PT - End of Session Equipment Utilized During Treatment: Gait belt Activity Tolerance: Patient limited by fatigue Patient left: in bed Nurse Communication: Mobility status  GP     Kristalynn Coddington 05/21/2013, 8:43 AM

## 2013-05-22 ENCOUNTER — Inpatient Hospital Stay (HOSPITAL_COMMUNITY): Payer: Medicare Other

## 2013-05-22 LAB — ANTI-NUCLEAR AB-TITER (ANA TITER): ANA Titer 1: 1:80 {titer} — ABNORMAL HIGH

## 2013-05-22 LAB — CBC
HCT: 43.7 % (ref 39.0–52.0)
HEMOGLOBIN: 15.3 g/dL (ref 13.0–17.0)
MCH: 32.1 pg (ref 26.0–34.0)
MCHC: 35 g/dL (ref 30.0–36.0)
MCV: 91.8 fL (ref 78.0–100.0)
PLATELETS: 255 10*3/uL (ref 150–400)
RBC: 4.76 MIL/uL (ref 4.22–5.81)
RDW: 13.1 % (ref 11.5–15.5)
WBC: 21.4 10*3/uL — AB (ref 4.0–10.5)

## 2013-05-22 LAB — GLUCOSE, CAPILLARY
GLUCOSE-CAPILLARY: 108 mg/dL — AB (ref 70–99)
GLUCOSE-CAPILLARY: 122 mg/dL — AB (ref 70–99)
GLUCOSE-CAPILLARY: 123 mg/dL — AB (ref 70–99)
GLUCOSE-CAPILLARY: 177 mg/dL — AB (ref 70–99)
Glucose-Capillary: 124 mg/dL — ABNORMAL HIGH (ref 70–99)
Glucose-Capillary: 170 mg/dL — ABNORMAL HIGH (ref 70–99)

## 2013-05-22 LAB — BASIC METABOLIC PANEL
BUN: 13 mg/dL (ref 6–23)
CALCIUM: 8.8 mg/dL (ref 8.4–10.5)
CO2: 22 mEq/L (ref 19–32)
Chloride: 102 mEq/L (ref 96–112)
Creatinine, Ser: 0.96 mg/dL (ref 0.50–1.35)
GFR calc Af Amer: 83 mL/min — ABNORMAL LOW (ref >90)
GFR, EST NON AFRICAN AMERICAN: 71 mL/min — AB (ref >90)
GLUCOSE: 119 mg/dL — AB (ref 70–99)
POTASSIUM: 3.8 meq/L (ref 3.7–5.3)
SODIUM: 142 meq/L (ref 137–147)

## 2013-05-22 LAB — ANA: Anti Nuclear Antibody(ANA): POSITIVE — AB

## 2013-05-22 LAB — STRIATED MUSCLE ANTIBODY: Striated Muscle Ab: <1:40 {titer}

## 2013-05-22 NOTE — Evaluation (Signed)
Occupational Therapy Evaluation Patient Details Name: Zachary [name] MRN: [id number] DOB: Nov 26, 1921 Today's Date: 05/22/2013 Time: 5361-4431 OT Time Calculation (min): 23 min  OT Assessment / Plan / Recommendation History of present illness Patient reports that for the past 1 month he has been feeling tired and out of energy. Patient endorses diffuse aches all over and severe constipation. No BM for 3 days. Patient was brought to ER In ER he finally had a BM and also was noted to be retaining urine.  On the monitor patient noted to be in bigemini. Denies any chest pain, fever, no shortness of breath. States he has spoken to his PCP regarding his generalized fatigue and was told that this is age related.   Clinical Impression   Pt demos decline in function with ADLs and ADL mobility safety with decreased strength, balance and endurance. Pt would benefit from acute OT services to address impairments to increase level of function and safety    OT Assessment  Patient needs continued OT Services    Follow Up Recommendations  SNF;Supervision/Assistance - 24 hour    Barriers to Discharge Decreased caregiver support Pt's wife unable to proivde current level of care, short term SNF recommended for therapy  Equipment Recommendations  None recommended by OT;Other (comment) (TBD at next level of care)    Recommendations for Other Services    Frequency  Min 2X/week    Precautions / Restrictions Precautions Precautions: Fall Restrictions Weight Bearing Restrictions: No   Pertinent Vitals/Pain No c/o pain    ADL  Grooming: Performed;Wash/dry hands;Wash/dry face;Min guard;Minimal assistance Where Assessed - Grooming: Supported sitting Upper Body Bathing: Simulated;Minimal assistance Where Assessed - Upper Body Bathing: Supported sitting Lower Body Bathing: Simulated;Maximal assistance Upper Body Dressing: Performed;Minimal assistance Where Assessed - Upper Body Dressing: Supported  sitting Lower Body Dressing: Maximal assistance;+1 Total assistance Toilet Transfer: Performed;Moderate assistance Toilet Transfer Method: Sit to stand;Stand pivot Toilet Transfer Equipment: Bedside commode Toileting - Clothing Manipulation and Hygiene: +1 Total assistance Where Assessed - Toileting Clothing Manipulation and Hygiene: Standing Tub/Shower Transfer Method: Not assessed Equipment Used: Gait belt;Rolling walker;Other (comment) (BSC) Transfers/Ambulation Related to ADLs:  cues for hand placement and technique using RW, decreased balance    OT Diagnosis: Generalized weakness  OT Problem List: Decreased strength;Decreased knowledge of use of DME or AE;Decreased activity tolerance;Impaired balance (sitting and/or standing) OT Treatment Interventions: Self-care/ADL training;Therapeutic exercise;Patient/family education;Neuromuscular education;Balance training;Therapeutic activities;DME and/or AE instruction   OT Goals(Current goals can be found in the care plan section) Acute Rehab OT Goals Patient Stated Goal: go to rehab then go home OT Goal Formulation: With patient/family Time For Goal Achievement: 05/29/13 Potential to Achieve Goals: Good ADL Goals Pt Will Perform Grooming: with set-up;with supervision;sitting Pt Will Perform Upper Body Bathing: with min guard assist;sitting;with supervision;with set-up Pt Will Perform Lower Body Bathing: with mod assist;sitting/lateral leans;sit to/from stand Pt Will Perform Upper Body Dressing: with min guard assist;with supervision;with set-up;sitting Pt Will Transfer to Toilet: with min assist;bedside commode;grab bars;ambulating;regular height toilet Pt Will Perform Toileting - Clothing Manipulation and hygiene: with max assist;with mod assist;sitting/lateral leans;sit to/from stand Additional ADL Goal #1: Pt will complete bed mobility with min A - min guard A  to sit EOB in prep for ADLs  Visit Information  Last OT Received On:  05/22/13 Assistance Needed: +1 History of Present Illness: Patient reports that for the past 1 month he has been feeling tired and out of energy. Patient endorses diffuse aches all over and severe constipation. No BM  for 3 days. Patient was brought to ER In ER he finally had a BM and also was noted to be retaining urine.  On the monitor patient noted to be in bigemini. Denies any chest pain, fever, no shortness of breath. States he has spoken to his PCP regarding his generalized fatigue and was told that this is age related.       Prior [place] expects to be discharged to:: Private residence Living Arrangements: Spouse/significant other Available Help at Discharge: Family Type of Home: House Home Access: Stairs to enter Technical brewer of Steps: 4 Entrance Stairs-Rails: Right Home Layout: Two level [place] - single point;Walker - 4 wheels;Wheelchair - manual Prior Function Level of Independence: Independent Communication Communication: No difficulties Dominant Hand: Right         Vision/Perception Vision - History Baseline Vision: Wears glasses only for reading Patient Visual Report: No change from baseline Perception Perception: Within Functional Limits   Cognition  Cognition Arousal/Alertness: Awake/alert Behavior During Therapy: WFL for tasks assessed/performed Overall Cognitive Status: Within Functional Limits for tasks assessed    Extremity/Trunk Assessment Upper Extremity Assessment Upper Extremity Assessment: Overall WFL for tasks assessed;Generalized weakness Lower Extremity Assessment Lower Extremity Assessment: Defer to PT evaluation Cervical / Trunk Assessment Cervical / Trunk Assessment: Kyphotic     Mobility Bed Mobility Overal bed mobility: Needs Assistance Bed Mobility: Sidelying to Sit;Sit to Supine Sidelying to sit: Min assist Sit to supine: Min assist General bed mobility comments: assist  for trunk and LEs, using bed rails, cues for sequencing.  Pt requires total A to scoot up in bed Transfers Overall transfer level: Needs assistance Equipment used: Rolling walker (2 wheeled) Transfers: Sit to/from [facility] Stand pivot transfers: Mod assist General transfer comment:  cues for hand placement and technique using RW          Balance Balance Overall balance assessment: Needs assistance Sitting-balance support: Bilateral upper extremity supported;Feet supported;Single extremity supported;No upper extremity supported Sitting balance-Leahy Scale: Poor Sitting balance - Comments: requires min A to sit EOB and during functional tasks/ADLs Postural control: Posterior lean;Right lateral lean   End of Session OT - End of Session Equipment Utilized During Treatment: Gait belt;Rolling walker;Other (comment) (BSC) Activity Tolerance: Patient limited by fatigue Patient left: in bed;with call bell/phone within reach;with bed alarm set;with family/visitor present;with nursing/sitter in room  GO     Britt Bottom 05/22/2013, 12:23 PM

## 2013-05-22 NOTE — Progress Notes (Signed)
Assessment/Plan: Principal Problem:   Weakness - please see comments under Subjective re: possible Parkinson's disease. I wonder if this is all progressive akinetic PD as no other dx has been forthcoming over the last few years. MRI pending this morning to look for new stroke but I don't see much evidence on exam. I have spoken to him in past about assisted living and he is ready to go to one but his wife steadfastly has refused to even consider or talk about it. She denies that they have any issues at all.  Active Problems:   Hypertension   Pulsus bigeminis   Dehydration - seems better   Diabetes mellitus   Left hemiparesis - not evident on exam to me.    Subjective: He feels okay this morning. States that his weakness and fatigue had worsened recently. Seen in office by NP on 05/15/13 and only new finding was a very low Vit D level (18).   I reviewed my office chart. He has been complaining of fatigue and weakness FOR YEARS. I have worked him up repeatedly. Finally, in 12/13, I suspected that he might have Parkinson's disease as he had an increasingly shuffling gait. He was seen by Dr. [name] in 12/13. 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 The patient had an MRI of the brain in early 2014 but never returned to see Dr. [name] to my knowledge (and he states today that "I only saw him once and did not know I was to see him again.")  Objective:  Vital Signs: Filed Vitals:   05/21/13 1457 05/21/13 1613 05/21/13 1948 05/22/13 0400  BP: 140/83 162/69 157/91 150/71  Pulse: 88 54 52 50  Temp:  98.6 F (37 C) 98.6 F (37 C) 99.1 F (37.3 C)  TempSrc:  Oral Oral Oral  Resp: 20 19 18 18   Height:    5\' 10"  (1.778 m)  Weight:    93.305 kg (205 lb 11.2 oz)  SpO2: 95% 94% 96% 94%     EXAM: Just back from MRI. Strength seems symmetric (I don't see any evidence of left hemiparesis)   Intake/Output Summary (Last 24 hours) at 05/22/13 0740 Last data filed at 05/21/13 2100  Gross  per 24 hour  Intake    465 ml  Output    100 ml  Net    365 ml    Lab Results:  Recent Labs  05/20/13 1734 05/20/13 1850 05/22/13 0245  NA 139  --  142  K 4.1  --  3.8  CL 99  --  102  CO2 23  --  22  GLUCOSE 107*  --  119*  BUN 16  --  13  CREATININE 0.97  --  0.96  CALCIUM 9.1  --  8.8  MG  --  1.9  --     Recent Labs  05/20/13 1850  AST 31  ALT 15  ALKPHOS 54  BILITOT 0.6  PROT 7.2  ALBUMIN 3.7    Recent Labs  05/20/13 1850  LIPASE 25    Recent Labs  05/20/13 0047  05/21/13 0323 05/22/13 0245  WBC  --   < > 18.3* 21.4*  NEUTROABS 13.5*  --  13.1*  --   HGB  --   < > 14.9 15.3  HCT  --   < > 43.2 43.7  MCV  --   < > 91.9 91.8  PLT  --   < > 238 255  < > = values in  this interval not displayed.  Recent Labs  05/20/13 0047 05/21/13 0323 05/21/13 0620 05/21/13 1210  CKTOTAL 621*  --   --   --   TROPONINI  --  <0.30 <0.30 <0.30   BNP No results found for this basename: probnp   No results found for this basename: DDIMER,  in the last 72 hours  Recent Labs  05/21/13 0323  HGBA1C 6.8*    Recent Labs  05/21/13 0323  CHOL 186  HDL 32*  LDLCALC 128*  TRIG 128  CHOLHDL 5.8    Recent Labs  05/21/13 0323  TSH 2.258   No results found for this basename: VITAMINB12, FOLATE, FERRITIN, TIBC, IRON, RETICCTPCT,  in the last 72 hours  Studies/Results: Dg Chest 2 View  05/20/2013   CLINICAL DATA:  Generalized weakness.  Shortness of breath.  EXAM: CHEST  2 VIEW  COMPARISON:  PA and lateral chest 05/15/2013.  FINDINGS: Lungs are clear. Heart size is normal. No pneumothorax or pleural effusion.  IMPRESSION: No acute disease.   Electronically Signed   By: Inge Rise M.D.   On: 05/20/2013 19:27   Ct Head Wo Contrast  05/20/2013   CLINICAL DATA:  Weakness worsening over 3 days. Cough, constipation and body aches.  EXAM: CT HEAD WITHOUT CONTRAST  TECHNIQUE: Contiguous axial images were obtained from the base of the skull through the vertex  without intravenous contrast.  COMPARISON:  MRI of the brain May 01, 2012  FINDINGS: Moderate ventriculomegaly, out of proportion of the degree of sulcal enlargement which is mildly effaced at the convexities. No intraparenchymal hemorrhage, mass effect nor midline shift. Confluent supratentorial white matter hypodensities are within normal range for patient's age and though non-specific suggest sequelae of chronic small vessel ischemic disease. No acute large vascular territory infarcts. Remote right basal ganglia globus pallidus lacunar infarct.  No abnormal extra-axial fluid collections. Basal cisterns are patent. Moderate calcific atherosclerosis of the carotid siphons and included vertebral arteries.  No skull fracture. Trace sphenoid ethmoidal mucosal thickening without paranasal sinus air-fluid levels. The mastoid air cells are well aerated. Severe left temporomandibular osteoarthrosis. Soft tissue within the external auditory canals may reflect cerumen. The included ocular globes and orbital contents are non-suspicious. Status post bilateral ocular lens implants.  IMPRESSION: No acute intracranial process.  Mild sulcal effacement of the convexities, disproportionate to the degree of sulcal enlargement can be seen with normal pressure hydrocephalus.  Moderate to severe white matter changes suggest chronic small vessel ischemic disease with remote right basal ganglia lacunar infarct, similar.   Electronically Signed   By: Elon Alas   On: 05/20/2013 23:25   Medications: Medications administered in the last 24 hours reviewed.  Current Medication List reviewed.    LOS: 2 days   [facility] @ [facility] [phone]) 05/22/2013, 7:40 AM

## 2013-05-22 NOTE — Progress Notes (Addendum)
Clinical Social Work Department CLINICAL SOCIAL WORK PLACEMENT NOTE 05/22/2013  Patient:  Zachary [name], Zachary [name]  Account Number:  [id number] Admit date:  05/20/2013  Clinical Social Worker:  Berton Mount, Latanya Presser  Date/time:  05/22/2013 10:30 AM  Clinical Social Work is seeking post-discharge placement for this patient at the following level of care:   [facility]   (*CSW will update this form in Epic as items are completed)   05/22/2013  Patient/family provided with [place] Department of Clinical Social Work's list of facilities offering this level of care within the geographic area requested by the patient (or if unable, by the patient's family).  05/22/2013  Patient/family informed of their freedom to choose among providers that offer the needed level of care, that participate in Medicare, Medicaid or managed care program needed by the patient, have an available bed and are willing to accept the patient.  05/22/2013  Patient/family informed of [facility]' ownership interest in [facility], as well as of the fact that they are under no obligation to receive care at this facility.  PASARR submitted to EDS on Existing PASARR number received from EDS on   FL2 transmitted to all facilities in geographic area requested by pt/family on  05/22/2013 FL2 transmitted to all facilities within larger geographic area on   Patient informed that his/her managed care company has contracts with or will negotiate with  certain facilities, including the following:     Patient/family informed of bed offers received:  05/22/2013 Patient chooses bed at [facility] Physician recommends and patient chooses bed at    Patient to be transferred to [facility] on   Patient to be transferred to facility by [facility]  The following physician request were entered in Epic:   Additional Comments:   [place], [place]

## 2013-05-22 NOTE — Progress Notes (Signed)
Pt is having frequent PVC with bigeminy and trigeminy, Dr. Maxwell Caul was made aware. Will continue to monitor.

## 2013-05-22 NOTE — Progress Notes (Signed)
CSW (Clinical Education officer, museum) visited pt room to provide bed offers. Pt wife was at bedside and CSW explained prior conversation about ST rehab at SNF with pt. Pt wife stated "this will not be necessary". Pt wife believes PT recommendation for SNF was only made because she told someone their bedroom is upstairs but they do have a bedroom downstairs he can sleep in. CSW explained that PT could come back in and see pt and see if this was the only reason for recommendation but likely that PT will continue to recommend SNF for overall independence and safety. CSW asked if pt wife felt she would be able to manage pt at home on her own. Pt wife stated once again they would be fine. CSW suspects that pt and pt wife both not comprehending recommendation and continuing to state that pt will be okay at home. May be beneficial for MD or PT/OT to have another conversation with pt and pt wife about pt current mobility and concern for safety at home. CSW has also informed pt nurse and RN CM.  [place], [place]

## 2013-05-22 NOTE — Progress Notes (Signed)
SLP Cancellation Note  Patient Details Name: Zachary [name] MRN: [id number] DOB: Nov 09, 1921   Cancelled treatment:       Reason Eval/Treat Not Completed: SLP screened, no needs identified, will sign off   Juan Quam Laurice 05/22/2013, 11:18 AM

## 2013-05-22 NOTE — Progress Notes (Signed)
Utilization review completed.  

## 2013-05-22 NOTE — Progress Notes (Signed)
Clinical Social Work Department BRIEF PSYCHOSOCIAL ASSESSMENT 05/22/2013  Patient:  Zachary [name], Zachary [name]     Account Number:  [id number]     Admit date:  05/20/2013  Clinical Social Worker:  Adair Laundry  Date/Time:  05/22/2013 10:20 AM  Referred by:  Physician  Date Referred:  05/22/2013 Referred for  SNF Placement   Other Referral:   Interview type:  Patient Other interview type:    PSYCHOSOCIAL DATA Living Status:  WIFE Admitted from facility:   Level of care:   Primary support name:  [name] [phone] Primary support relationship to patient:  SPOUSE Degree of support available:   Pt reports having good support system    CURRENT CONCERNS Current Concerns  Post-Acute Placement   Other Concerns:    SOCIAL WORK ASSESSMENT / PLAN CSW aware of PT recommendation for SNF. CSW discussed recommendation with pt. Pt was unaware of recommendation but is agreeable to SNF for ST rehab. Pt was sleepy during assessment so conversation was limited, but pt did agree to being referred out to [facility] has no preferences at this time.   Assessment/plan status:  Psychosocial Support/Ongoing Assessment of Needs Other assessment/ plan:   Information/referral to community resources:   SNF list to be provided with bed offers    PATIENT'S/FAMILY'S RESPONSE TO PLAN OF CARE: Pt is agreeable to SNF       [place], [place]

## 2013-05-23 ENCOUNTER — Inpatient Hospital Stay (HOSPITAL_COMMUNITY): Payer: Medicare Other

## 2013-05-23 ENCOUNTER — Encounter (HOSPITAL_COMMUNITY): Payer: Self-pay | Admitting: *Deleted

## 2013-05-23 DIAGNOSIS — I6529 Occlusion and stenosis of unspecified carotid artery: Secondary | ICD-10-CM

## 2013-05-23 DIAGNOSIS — I6350 Cerebral infarction due to unspecified occlusion or stenosis of unspecified cerebral artery: Secondary | ICD-10-CM

## 2013-05-23 LAB — GLUCOSE, CAPILLARY
Glucose-Capillary: 106 mg/dL — ABNORMAL HIGH (ref 70–99)
Glucose-Capillary: 112 mg/dL — ABNORMAL HIGH (ref 70–99)
Glucose-Capillary: 115 mg/dL — ABNORMAL HIGH (ref 70–99)
Glucose-Capillary: 122 mg/dL — ABNORMAL HIGH (ref 70–99)
Glucose-Capillary: 147 mg/dL — ABNORMAL HIGH (ref 70–99)
Glucose-Capillary: 156 mg/dL — ABNORMAL HIGH (ref 70–99)

## 2013-05-23 MED ORDER — INSULIN ASPART 100 UNIT/ML ~~LOC~~ SOLN
0.0000 [IU] | Freq: Three times a day (TID) | SUBCUTANEOUS | Status: DC
  Administered 2013-05-24: 2 [IU] via SUBCUTANEOUS
  Administered 2013-05-24 – 2013-05-26 (×4): 1 [IU] via SUBCUTANEOUS
  Administered 2013-05-28: 2 [IU] via SUBCUTANEOUS
  Administered 2013-05-28 – 2013-05-29 (×2): 1 [IU] via SUBCUTANEOUS
  Administered 2013-05-29: 2 [IU] via SUBCUTANEOUS
  Administered 2013-05-30 (×2): 1 [IU] via SUBCUTANEOUS

## 2013-05-23 MED ORDER — IOHEXOL 350 MG/ML SOLN
80.0000 mL | Freq: Once | INTRAVENOUS | Status: AC | PRN
  Administered 2013-05-23: 80 mL via INTRAVENOUS

## 2013-05-23 NOTE — Progress Notes (Signed)
Stroke Team Progress Note  HISTORY Zachary [name] is an 80 y.o. male who reports that he has had a slowly progressive weakness that has occurred over the past 2 weeks. It is now to the point that he is no longer going up stairs because he feels unsafe. He feels unsafe to drive as well. Has extensive difficulty getting up from the toilet. Has generalized achiness. Reports tingling in his fingertips that has been present for about a week. Also reports that he has been constipated for about a week which is unusual for him. He has not had any bladder issues. When he walks feels as if his legs are bucking on him and he feels off balance. He does not describe vertigo. Patient was not administerd TPA secondary to delay in arrival. He was admitted for further evaluation and treatment.  SUBJECTIVE No family is at the bedside.  Overall he feels his condition is stable. Patient independent with ADLs, still drives, lives with wife.  OBJECTIVE Most recent Vital Signs: Filed Vitals:   05/22/13 0958 05/22/13 1408 05/22/13 2022 05/23/13 0429  BP: 177/81 158/66 164/53 148/77  Pulse: 63 74 65 84  Temp:  98.7 F (37.1 C) 98 F (36.7 C) 98.1 F (36.7 C)  TempSrc:  Oral Oral   Resp:  19 18 18   Height:      Weight:      SpO2:  93% 96% 94%   CBG (last 3)   Recent Labs  05/23/13 0013 05/23/13 0425 05/23/13 0727  GLUCAP 106* 112* 122*    IV Fluid Intake:     MEDICATIONS  . aspirin  300 mg Rectal Daily   Or  . aspirin  325 mg Oral Daily  . docusate sodium  100 mg Oral BID  . insulin aspart  0-9 Units Subcutaneous Q4H  . metoprolol tartrate  12.5 mg Oral BID  . pantoprazole  40 mg Oral Daily  . pentoxifylline  400 mg Oral TID WC  . senna-docusate  1 tablet Oral QHS  . tamsulosin  0.4 mg Oral Daily   PRN:  acetaminophen, acetaminophen, labetalol, polyethylene glycol, temazepam  Diet:  Carb Control thin liquids Activity:  OOB with assistance DVT Prophylaxis:  scds ordered  CLINICALLY  SIGNIFICANT STUDIES Basic Metabolic Panel:  Recent Labs Lab 05/20/13 1734 05/20/13 1850 05/22/13 0245  NA 139  --  142  K 4.1  --  3.8  CL 99  --  102  CO2 23  --  22  GLUCOSE 107*  --  119*  BUN 16  --  13  CREATININE 0.97  --  0.96  CALCIUM 9.1  --  8.8  MG  --  1.9  --    Liver Function Tests:  Recent Labs Lab 05/20/13 1850  AST 31  ALT 15  ALKPHOS 54  BILITOT 0.6  PROT 7.2  ALBUMIN 3.7   CBC:  Recent Labs Lab 05/20/13 0047  05/21/13 0323 05/22/13 0245  WBC  --   < > 18.3* 21.4*  NEUTROABS 13.5*  --  13.1*  --   HGB  --   < > 14.9 15.3  HCT  --   < > 43.2 43.7  MCV  --   < > 91.9 91.8  PLT  --   < > 238 255  < > = values in this interval not displayed. Coagulation:  Recent Labs Lab 05/20/13 0047  LABPROT 12.4  INR 0.94   Cardiac Enzymes:  Recent Labs Lab 05/20/13  0047 05/21/13 0323 05/21/13 0620 05/21/13 1210  CKTOTAL 621*  --   --   --   TROPONINI  --  <0.30 <0.30 <0.30   Urinalysis:  Recent Labs Lab 05/20/13 2040  COLORURINE YELLOW  LABSPEC 1.013  PHURINE 6.0  GLUCOSEU NEGATIVE  HGBUR NEGATIVE  BILIRUBINUR NEGATIVE  KETONESUR 15*  PROTEINUR NEGATIVE  UROBILINOGEN 0.2  NITRITE NEGATIVE  LEUKOCYTESUR NEGATIVE   Lipid Panel    Component Value Date/Time   CHOL 186 05/21/2013 0323   TRIG 128 05/21/2013 0323   HDL 32* 05/21/2013 0323   CHOLHDL 5.8 05/21/2013 0323   VLDL 26 05/21/2013 0323   LDLCALC 128* 05/21/2013 0323   HgbA1C  Lab Results  Component Value Date   HGBA1C 6.8* 05/21/2013    Urine Drug Screen:   No results found for this basename: labopia, cocainscrnur, labbenz, amphetmu, thcu, labbarb    Alcohol Level: No results found for this basename: ETH,  in the last 168 hours   CT of the brain  05/20/2013 No acute intracranial process.  Mild sulcal effacement of the convexities, disproportionate to the degree of sulcal enlargement can be seen with normal pressure hydrocephalus.  Moderate to severe white matter changes suggest chronic  small vessel ischemic disease with remote right basal ganglia lacunar infarct, similar.   MRI of the brain  05/22/2013   1. Small, acute posterior right frontal infarct. 2. Unchanged appearance of cerebral atrophy and chronic small vessel ischemic disease with remote lacunar infarcts   MRA of the brain  05/22/2013    No evidence of proximal intracranial arterial occlusion. There is diminished flow related enhancement and caliber of the distal right vertebral artery, intracranial right ICA, and right MCA near the bifurcation, which could reflect worsening intracranial atherosclerotic disease or be at least partly artifactual.     2D Echocardiogram  EF 55-60% with no source of embolus.   Carotid Doppler  Right: Greater than 80% internal carotid artery stenosis. Unable to evaluate the vertebral due to movement. Left : 40% to 59 % ICA stenosis upper end of scale by systolic velocities. Increase may possible be due in part to compensatory flow from the stenosis on the right. Vertebral artery flow could not be evaluated due to movement and snoring  CXR  05/20/2013 No acute disease.  EKG  sinus tachycardia. For complete results please see formal report.   Therapy Recommendations SNF  Physical Exam   Elderly Caucasian male not in distress.Awake alert. Afebrile. Head is nontraumatic. Neck is supple with soft bilateral carotid bruits. Hearing is diminished bilaterallyl. Cardiac exam no murmur or gallop. Lungs are clear to auscultation. Distal pulses are well felt. Neurological Exam ;  Awake  Alert oriented x 3. Diminished attention, registration and recall. Normal speech and language.eye movements full without nystagmus.fundi were not visualized. Vision acuity and fields appear normal. Hearing is diminished bilaterally. Palatal movements are normal. Face symmetric. Tongue midline. Normal strength, tone, reflexes and coordination. Normal sensation. Gait deferred. ASSESSMENT Zachary [name] is a 80 y.o.  male presenting with slowly progressive weakness. Imaging confirms a small right posterior frontal infarct with the setting of R ICA stenosis. Infarct felt to be due to large vessel disease. On aspirin 81 mg orally every day and trental prior to admission. Now on aspirin 325 mg orally every day for secondary stroke prevention. Given patients functional status, feel he would be a good candidate for revascularization.   Diabetes, HgbA1c 6.8, goal < 7.0  PVD  Prostate cancer  Family hx stroke (father)  Bigeminy & Trigeminy  Bilateral ICA stenosis (R >80%, L 40-59%)  Hospital day # 3  TREATMENT/PLAN  Continue aspirin 325 mg orally every day for secondary stroke prevention.  Recommend [facility] consult for CEA vs stent. Needs an angio to better confirm degree of stenosis and appropriate treatment - have consulted [facility] and ordered angio.  Dr. Leonie Man discussed via telephone with Dr. Isidoro Donning.  Burnetta Sabin, MSN, RN, ANVP-BC, ANP-BC, [facility] Pager: [phone] 05/23/2013 8:09 AM  I have personally obtained a history, examined the patient, evaluated imaging results, and formulated the assessment and plan of care. I agree with the above. Antony Contras, MD

## 2013-05-23 NOTE — Consult Note (Signed)
[facility]  Reason for Consult:  Right carotid artery stenosis Referring Physician:  Leonie Man MRN #:  [id number]  History of Present Illness: This is a 80 y.o. male who states that he went to his PCP for a check up because he "just didn't feel good" and was tired all the time (he states he has been tired about 6 months).  He states that he has had some numbness and tingling in both hands.  He states that his PCP sent him to the hospital from the office.  He denies any amaurosis fugax, visual changes, speech difficulties or unsteady gait.  He states that he does get occasional cramps in his calves when walking, but states he can walk several city blocks without pain.  He does have remote tobacco use, but quit in 1962.     Past Medical History  Diagnosis Date  . Prostate cancer 2000  . PVD (peripheral vascular disease)   . Prostate enlargement   . Insomnia   . Pancreatitis   . PVC's (premature ventricular contractions)   . Heart block   . Diabetes mellitus without complication    Past Surgical History  Procedure Laterality Date  . Cholecystectomy  07/18/2011    Procedure: LAPAROSCOPIC CHOLECYSTECTOMY;  Surgeon: Gwenyth Ober, MD;  Location: [place];  Service: General;  Laterality: N/A;  . Prostatectomy      Allergies  Allergen Reactions  . Penicillins Rash    Prior to Admission medications   Medication Sig Start Date End Date Taking? Authorizing Provider  aspirin EC 81 MG tablet Take 81 mg by mouth daily.   Yes Historical Provider, MD  ergocalciferol (VITAMIN D2) 50000 UNITS capsule Take 50,000 Units by mouth once a week. Friday   Yes Historical Provider, MD  flurazepam (DALMANE) 30 MG capsule Take 30 mg by mouth at bedtime as needed. sleep   Yes Historical Provider, MD  glimepiride (AMARYL) 1 MG tablet Take 1 mg by mouth daily before breakfast.   Yes Historical Provider, MD  Multiple Vitamin (MULTIVITAMIN WITH MINERALS) TABS Take 1 tablet by mouth  daily.   Yes Historical Provider, MD  omeprazole (PRILOSEC) 20 MG capsule Take 20 mg by mouth daily.   Yes Historical Provider, MD  pentoxifylline (TRENTAL) 400 MG CR tablet TAKE 1 TABLET BY MOUTH THREE TIMES A DAY WITH MEALS 05/12/13  Yes Casandra Doffing, MD  Tamsulosin HCl (FLOMAX) 0.4 MG CAPS Take 0.4 mg by mouth daily.    Yes Historical Provider, MD    History   Social History  . Marital Status: Married    Spouse Name: N/A    Number of Children: N/A  . Years of Education: N/A   Occupational History  . Not on file.   Social History Main Topics  . Smoking status: Former Smoker -- 20 years  . Smokeless tobacco: Not on file  . Alcohol Use: No  . Drug Use: No  . Sexual Activity: Not on file   Other Topics Concern  . Not on file   Social History Narrative  . No narrative on file     Family History  Problem Relation Age of Onset  . Breast cancer Daughter   . Stroke Father   Mother lived to be in her 51's without significant health issues. Father died in 1948-possibly with PNA, but pt is unsure.  ROS: [x]  Positive   [ ]  Negative   [ ]  All sytems reviewed and are negative  Cardiovascular: []  chest  pain/pressure []  palpitations []  SOB lying flat []  DOE []  pain in legs while walking []  pain in legs at rest []  pain in legs at night []  non-healing ulcers []  hx of DVT []  swelling in legs  Pulmonary: []  productive cough []  asthma/wheezing []  home O2  Neurologic: []  weakness in []  arms []  legs [x]  numbness in [x]  hands []  legs []  hx of CVA []  mini stroke [] difficulty speaking or slurred speech []  temporary loss of vision in one eye []  dizziness  Hematologic: []  hx of cancer []  bleeding problems []  problems with blood clotting easily  Endocrine:   [x]  diabetes []  thyroid disease  GI []  vomiting blood []  blood in stool  GU: []  CKD/renal failure []  HD--[]  M/W/F or []  T/T/S []  burning with urination []  blood in urine [x]  hx of prostate ca with surgery  years ago  Psychiatric: []  anxiety []  depression  Musculoskeletal: []  arthritis []  joint pain  Integumentary: []  rashes []  ulcers  Constitutional: []  fever []  chills [x]  fatigue   Physical Examination  Filed Vitals:   05/23/13 0958  BP: 116/69  Pulse: 106  Temp:   Resp:    Body mass index is 29.51 kg/(m^2).  General:  WDWN in NAD Gait: Not observed HENT: WNL, normocephalic Eyes: Pupils equal Pulmonary: normal non-labored breathing, without Rales, rhonchi,  wheezing Cardiac: regular, without  Murmurs, rubs or gallops; without carotid bruits Abdomen: soft, NT/ND, no masses Skin: without rashes, without ulcers  Vascular Exam/Pulses:+ palpable radial pulses bilaterally Extremities: without ischemic changes, without Gangrene , without cellulitis; without open wounds;  Musculoskeletal: no muscle wasting or atrophy  Neurologic: A&O X 3; Appropriate Affect ; SENSATION: normal; MOTOR FUNCTION:  moving all extremities equally. Speech is fluent/normal   CBC    Component Value Date/Time   WBC 21.4* 05/22/2013 0245   RBC 4.76 05/22/2013 0245   HGB 15.3 05/22/2013 0245   HCT 43.7 05/22/2013 0245   PLT 255 05/22/2013 0245   MCV 91.8 05/22/2013 0245   MCH 32.1 05/22/2013 0245   MCHC 35.0 05/22/2013 0245   RDW 13.1 05/22/2013 0245   LYMPHSABS 3.1 05/21/2013 0323   MONOABS 1.9* 05/21/2013 0323   EOSABS 0.3 05/21/2013 0323   BASOSABS 0.0 05/21/2013 0323    BMET    Component Value Date/Time   NA 142 05/22/2013 0245   K 3.8 05/22/2013 0245   CL 102 05/22/2013 0245   CO2 22 05/22/2013 0245   GLUCOSE 119* 05/22/2013 0245   BUN 13 05/22/2013 0245   CREATININE 0.96 05/22/2013 0245   CALCIUM 8.8 05/22/2013 0245   GFRNONAA 71* 05/22/2013 0245   GFRAA 83* 05/22/2013 0245    COAGS: Lab Results  Component Value Date   INR 0.94 05/20/2013   INR 1.0 12/16/2008     Non-Invasive Vascular Imaging:   Carotid duplex 05/23/13: Preliminary report: Right;: Greater than 80% internal carotid artery stenosis. Unable to  evaluate the vertebral due to movement. Left : 40% to 59 % ICA stenosis upper end of scale by systolic velocities. Increase may possible be due in part to compensatory flow from the stenosis on the right. Vertebral artery flow could not be evaluated due to movement and snoring  CTA 05/23/13: IMPRESSION:  1. Widespread severe atherosclerosis with multifocal significant  arterial stenoses.  2. With regard to the carotids and anterior circulation:  - 50% stenosis right CCA.  - very high-grade stenosis with radiographic string sign at the  right ICA origin over a distance of 1 cm.  The right ICA remains  patent despite this finding, but is hypo enhancing compared to the  left side.  - left ICA distal bulb stenosis up to 60 -65%.  - extensive bilateral ICA siphon calcified plaque, resulting in  bilateral siphon moderate stenosis.  - hypoenhancing right ICA terminus, right ACA and right MCA, but no  major intracranial branch occlusion.  3. With regard to the vertebral arteries and posterior circulation:  - 75% right subclavian artery origin stenosis.  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  -reconstituted distal right vertebral artery from muscular branches  just below the skullbase. Right PICA is patent, the right vertebral  be on the PICA is diminutive/stenotic.  - moderate left vertebral artery origin stenosis. Severe stenosis of  the proximal left V2 segment related to soft plaque at the C6 level.  4. Stable CT appearance of the brain.  Statin:  no Beta Blocker:  no Aspirin:  yes ACEI:  no ARB:  no Other antiplatelets/anticoagulants:  yes-Trental   ASSESSMENT: This is a 80 y.o. male with > 80% right ICA stenosis  PLAN: -Will d/w Dr. [name] and he will be in to see the pt this afternoon.   Leontine Locket, PA-C [facility] [phone]

## 2013-05-23 NOTE — Consult Note (Signed)
I have examined the patient, reviewed and agree with above. Very pleasant 80 year old gentleman. He is completely alert and oriented. He specifically denies any focal deficits causing admission. He reports that he has had progressive weakness and this is also documented in Dr. Nancee Liter notes. Workup included carotid duplex revealing critical right internal carotid artery stenosis and MRI showing small right brain stroke. He did undergo CT angiogram today and I reviewed this. This does show a radiographic string sign in his internal carotid artery above the bifurcation. He does have return to normal size of his internal carotid below the angle of the jaw.  I discussed this at length with the patient. I explained that correcting his critical stenosis of his right internal carotid artery would not improve his generalized weakness. I did explain that this was the most likely source of the finding on his MRI of an acute event in his right brain. I did explain that this puts him at increased risk for having a right brain stroke that could leave him paralyzed on his left side. I did explain that we did not ignore the fact that he is 80 years old but that carotid surgery is typically very well tolerated. He is otherwise vigorous with no major cardiac difficulties. He states that he would like to do anything that would reduce his risk for having a stroke as he wants to continue to be active. I would recommend reading several weeks to allow him to recover from this hospitalization. He could be admitted for admission day surgery if all providers agree that this is the most appropriate course. I would not recommend carotid stenting. He does have extensive calcification at the bifurcation making him much higher risk for embolic complications with the stenting.  Will await Dr. Nancee Liter input on this as well. He has known Mr. Campion for many years. Will follow with you while hospitalized.  Diante Barley, MD 05/23/2013 6:28  PM

## 2013-05-23 NOTE — Progress Notes (Signed)
CSW [employer] technical [employer]) spoke with pt and pt wife. They have reconsidered SNF and pt would like to go to [facility]. CSW called facility and left voicemail to confirm pt is able to dc there when medically stable.  [place], [place]

## 2013-05-23 NOTE — Progress Notes (Signed)
Physical Therapy Treatment Patient Details Name: Zachary [name] MRN: [id number] DOB: 03-Jan-1922 Today's Date: 05/23/2013 Time: 9381-0175 PT Time Calculation (min): 18 min  PT Assessment / Plan / Recommendation  History of Present Illness 80 y.o. male admitted to [facility] on 05/20/13 with 1 month h/o progressive weakness.  Patient endorses diffuse aches all over and severe constipation. No BM for 3 days. Patient was brought to ER In ER he finally had a BM and also was noted to be retaining urine. On the monitor patient noted to be in bigemini. Denies any chest pain, fever, no shortness of breath. States he has spoken to his PCP regarding his generalized fatigue and was told that this is age related.   PT Comments   Pt is doing much better with his gait an mobility today, but continues to need min to mod assist for gait and bed mobility.  His wife is in the room today and is unsteady on her feet.  She would only be able to safely provide supervision at home for risk of them both getting off balance and falling.  SNF does continue to be appropriate at d/c.    Follow Up Recommendations  SNF     Does the patient have the potential to tolerate intense rehabilitation    NA  Barriers to Discharge   Elderly wife can only provide supervision at home      Equipment Recommendations  None recommended by PT    Recommendations for Other Services   None  Frequency Min 2X/week   Progress towards PT Goals Progress towards PT goals: Progressing toward goals  Plan Current plan remains appropriate;Frequency needs to be updated    Precautions / Restrictions Precautions Precautions: Fall Precaution Comments: pt is generally weak and deconditioned.    Pertinent Vitals/Pain See vitals flow sheet.     Mobility  Bed Mobility Overal bed mobility: Needs Assistance Bed Mobility: Supine to Sit Supine to sit: Mod assist General bed mobility comments: Mod assist to support trunk during transitions.  Pt reacing for  leverage and using the bed rail to help pull up.   Transfers Overall transfer level: Needs assistance Equipment used: Rolling walker (2 wheeled) Transfers: Sit to/from Stand Sit to Stand: Min assist General transfer comment: Min assist to stand from higher bed with verbal cues for safe hand placement.  Min assist to help control descent to sit in lower chair with cues for safe hand placement.  Ambulation/Gait Ambulation/Gait assistance: Min assist Ambulation Distance (Feet): 80 Feet Assistive device: Rolling walker (2 wheeled) Gait Pattern/deviations: Step-through pattern;Shuffle;Trunk flexed (bil knee flexion during stance which increased with fatigue) Gait velocity: decreased Gait velocity interpretation: <1.8 ft/sec, indicative of risk for recurrent falls General Gait Details: slow, shuffled, flexed gait.      Exercises General Exercises - Upper Extremity Shoulder Flexion: AROM;Both;10 reps;Seated Elbow Flexion: AROM;Both;10 reps;Seated General Exercises - Lower Extremity Long Arc Quad: AROM;Both;10 reps;Seated Hip Flexion/Marching: AROM;Both;10 reps;Seated Toe Raises: AROM;Both;10 reps;Seated Heel Raises: AROM;Both;10 reps;Seated     PT Goals (current goals can now be found in the care plan section) Acute Rehab PT Goals Patient Stated Goal: go to rehab then go home  Visit Information  Last PT Received On: 05/23/13 Assistance Needed: +1 History of Present Illness: 80 y.o. male admitted to [facility] on 05/20/13 with 1 month h/o progressive weakness.  Patient endorses diffuse aches all over and severe constipation. No BM for 3 days. Patient was brought to ER In ER he finally had a BM and  also was noted to be retaining urine. On the monitor patient noted to be in bigemini. Denies any chest pain, fever, no shortness of breath. States he has spoken to his PCP regarding his generalized fatigue and was told that this is age related.    Subjective Data  Subjective: Pt reports that he feels  stronger today.  Wife in room and a friend from church.   Patient Stated Goal: go to rehab then go home   Cognition  Cognition Arousal/Alertness: Awake/alert Behavior During Therapy: [facility] for tasks assessed/performed Overall Cognitive Status: Within Functional Limits for tasks assessed    Balance  Balance Overall balance assessment: Needs assistance Standing balance support: Bilateral upper extremity supported Standing balance-Leahy Scale: Poor  End of Session PT - End of Session Equipment Utilized During Treatment: Gait belt Activity Tolerance: Patient limited by fatigue Patient left: in chair;with call bell/phone within reach;with family/visitor present       Wells Guiles B. [place], [place], DPT [phone]   05/23/2013, 12:07 PM

## 2013-05-23 NOTE — Progress Notes (Signed)
Assessment/Plan: Principal Problem:   Weakness - has a small right brain acute stroke on top of his prior weakness (? Akinetic PD - see 05/22/13 note). I told him that it would be best to go to SNF rehab prior to trying to get home and I reminded him of our conversations in the past. He said they could move bedroom to first floor and he thinks they will be okay in the long term. At this point, I asked him to allow PT to see him again today and see if they think SNF rehab is appropriate and he is agreeable.  Active Problems:   Hypertension - BP is fine.    Pulsus bigeminis   Dehydration   Diabetes mellitus - CBGs controlled   Acute ischemic stroke - see comments above   Subjective: Patient feels better. Had another BM this morning and thinks his whole problem was constipation.   I noted to him that he has been telling me for years that he and his wife were "barely making it" and they should be in ALF. He now states that he was saying that because "she works so hard" in the home.   Objective:  Vital Signs: Filed Vitals:   05/22/13 0958 05/22/13 1408 05/22/13 2022 05/23/13 0429  BP: 177/81 158/66 164/53 148/77  Pulse: 63 74 65 84  Temp:  98.7 F (37.1 C) 98 F (36.7 C) 98.1 F (36.7 C)  TempSrc:  Oral Oral   Resp:  19 18 18   Height:      Weight:      SpO2:  93% 96% 94%     EXAM: Motor exam is symmetric   Intake/Output Summary (Last 24 hours) at 05/23/13 0736 Last data filed at 05/23/13 0529  Gross per 24 hour  Intake 2781.25 ml  Output   2800 ml  Net -18.75 ml    Lab Results:  Recent Labs  05/20/13 1734 05/20/13 1850 05/22/13 0245  NA 139  --  142  K 4.1  --  3.8  CL 99  --  102  CO2 23  --  22  GLUCOSE 107*  --  119*  BUN 16  --  13  CREATININE 0.97  --  0.96  CALCIUM 9.1  --  8.8  MG  --  1.9  --     Recent Labs  05/20/13 1850  AST 31  ALT 15  ALKPHOS 54  BILITOT 0.6  PROT 7.2  ALBUMIN 3.7    Recent Labs  05/20/13 1850  LIPASE 25    Recent  Labs  05/21/13 0323 05/22/13 0245  WBC 18.3* 21.4*  NEUTROABS 13.1*  --   HGB 14.9 15.3  HCT 43.2 43.7  MCV 91.9 91.8  PLT 238 255    Recent Labs  05/21/13 0323 05/21/13 0620 05/21/13 1210  TROPONINI <0.30 <0.30 <0.30   BNP No results found for this basename: probnp   No results found for this basename: DDIMER,  in the last 72 hours  Recent Labs  05/21/13 0323  HGBA1C 6.8*    Recent Labs  05/21/13 0323  CHOL 186  HDL 32*  LDLCALC 128*  TRIG 128  CHOLHDL 5.8    Recent Labs  05/21/13 0323  TSH 2.258   No results found for this basename: VITAMINB12, FOLATE, FERRITIN, TIBC, IRON, RETICCTPCT,  in the last 72 hours  Studies/Results: Mr [facility] Wo Contrast  05/22/2013   CLINICAL DATA:  Weakness.  Rule out stroke.  EXAM: MRI  HEAD WITHOUT CONTRAST  MRA HEAD WITHOUT CONTRAST  TECHNIQUE: Multiplanar, multiecho pulse sequences of the brain and surrounding structures were obtained without intravenous contrast. Angiographic images of the head were obtained using MRA technique without contrast.  COMPARISON:  Head CT 05/20/2013 and brain MRI/ MRA 05/01/2012  FINDINGS: MRI HEAD FINDINGS  Images are mildly to moderately degraded by motion artifact. Incidental note is made of a partially empty sella. There is a small, acute infarct in the right frontoparietal region near the vertex likely involving the precentral gyrus. Remote lacunar infarcts are identified involving the right lentiform nucleus and right corona radiata. Small, remote cortical infarcts are noted involving the left greater than right parietal lobes. There is moderate cerebral atrophy with enlargement of the ventricles and sylvian fissures and proportionately less sulcal enlargement near the vertex, unchanged from prior MRI.  Foci of T2 hyperintensity within the periventricular white matter and brainstem do not appear significantly changed and are nonspecific but compatible with mild to moderate chronic small vessel  ischemic disease. There is no evidence of intracranial hemorrhage, mass, midline shift, or extra-axial fluid collection. Prior bilateral cataract surgery is noted. Paranasal sinuses and mastoid air cells are clear. Moderate pannus is present at C1-2.  MRA HEAD FINDINGS  Images are mildly to moderately degraded by motion artifact. The visualized distal right vertebral artery appears very small in caliber with diminished flow related enhancement and vessel irregularity, more pronounced than on the prior MRI although there is more motion artifact on the current study. The visualized distal left vertebral artery is patent and dominant without evidence of stenosis. PICA origins are patent bilaterally. SCA origins are patent. Basilar artery is patent without stenosis. There is a fetal origin of the left PCA. There is a mild, focal stenosis of the P1 segment of the right PCA, not evident on the prior study.  Internal carotid arteries are patent from skullbase to carotid termini. The intracranial right ICA is smaller in caliber than the left ICA with less flow related enhancement than on the prior study. The right carotid siphon and proximal supraclinoid ICA appear moderately narrowed. The right A1 segment is mildly irregular without focal stenosis. ACAs are otherwise unremarkable. Diminished flow related enhancement is present in the right MCA near the bifurcation, although the more distal branches appear patent and of relatively normal caliber. Left MCA is unremarkable. No intracranial aneurysm is identified.  IMPRESSION: 1. Small, acute posterior right frontal infarct. 2. Unchanged appearance of cerebral atrophy and chronic small vessel ischemic disease with remote lacunar infarcts as above. 3. No evidence of proximal intracranial arterial occlusion. There is diminished flow related enhancement and caliber of the distal right vertebral artery, intracranial right ICA, and right MCA near the bifurcation, which could  reflect worsening intracranial atherosclerotic disease or be at least partly artifactual.   Electronically Signed   By: Logan Bores   On: 05/22/2013 08:28   Mr Brain Wo Contrast  05/22/2013   CLINICAL DATA:  Weakness.  Rule out stroke.  EXAM: MRI HEAD WITHOUT CONTRAST  MRA HEAD WITHOUT CONTRAST  TECHNIQUE: Multiplanar, multiecho pulse sequences of the brain and surrounding structures were obtained without intravenous contrast. Angiographic images of the head were obtained using MRA technique without contrast.  COMPARISON:  Head CT 05/20/2013 and brain MRI/ MRA 05/01/2012  FINDINGS: MRI HEAD FINDINGS  Images are mildly to moderately degraded by motion artifact. Incidental note is made of a partially empty sella. There is a small, acute infarct in the right frontoparietal  region near the vertex likely involving the precentral gyrus. Remote lacunar infarcts are identified involving the right lentiform nucleus and right corona radiata. Small, remote cortical infarcts are noted involving the left greater than right parietal lobes. There is moderate cerebral atrophy with enlargement of the ventricles and sylvian fissures and proportionately less sulcal enlargement near the vertex, unchanged from prior MRI.  Foci of T2 hyperintensity within the periventricular white matter and brainstem do not appear significantly changed and are nonspecific but compatible with mild to moderate chronic small vessel ischemic disease. There is no evidence of intracranial hemorrhage, mass, midline shift, or extra-axial fluid collection. Prior bilateral cataract surgery is noted. Paranasal sinuses and mastoid air cells are clear. Moderate pannus is present at C1-2.  MRA HEAD FINDINGS  Images are mildly to moderately degraded by motion artifact. The visualized distal right vertebral artery appears very small in caliber with diminished flow related enhancement and vessel irregularity, more pronounced than on the prior MRI although there is  more motion artifact on the current study. The visualized distal left vertebral artery is patent and dominant without evidence of stenosis. PICA origins are patent bilaterally. SCA origins are patent. Basilar artery is patent without stenosis. There is a fetal origin of the left PCA. There is a mild, focal stenosis of the P1 segment of the right PCA, not evident on the prior study.  Internal carotid arteries are patent from skullbase to carotid termini. The intracranial right ICA is smaller in caliber than the left ICA with less flow related enhancement than on the prior study. The right carotid siphon and proximal supraclinoid ICA appear moderately narrowed. The right A1 segment is mildly irregular without focal stenosis. ACAs are otherwise unremarkable. Diminished flow related enhancement is present in the right MCA near the bifurcation, although the more distal branches appear patent and of relatively normal caliber. Left MCA is unremarkable. No intracranial aneurysm is identified.  IMPRESSION: 1. Small, acute posterior right frontal infarct. 2. Unchanged appearance of cerebral atrophy and chronic small vessel ischemic disease with remote lacunar infarcts as above. 3. No evidence of proximal intracranial arterial occlusion. There is diminished flow related enhancement and caliber of the distal right vertebral artery, intracranial right ICA, and right MCA near the bifurcation, which could reflect worsening intracranial atherosclerotic disease or be at least partly artifactual.   Electronically Signed   By: Logan Bores   On: 05/22/2013 08:28   Medications: Medications administered in the last 24 hours reviewed.  Current Medication List reviewed.    LOS: 3 days   [facility] Internal Medicine @ [facility] [phone]) 05/23/2013, 7:36 AM

## 2013-05-24 LAB — GLUCOSE, CAPILLARY
GLUCOSE-CAPILLARY: 151 mg/dL — AB (ref 70–99)
GLUCOSE-CAPILLARY: 116 mg/dL — AB (ref 70–99)
Glucose-Capillary: 108 mg/dL — ABNORMAL HIGH (ref 70–99)
Glucose-Capillary: 146 mg/dL — ABNORMAL HIGH (ref 70–99)

## 2013-05-24 MED ORDER — GUAIFENESIN 100 MG/5ML PO SYRP
200.0000 mg | ORAL_SOLUTION | ORAL | Status: DC | PRN
  Administered 2013-05-25 – 2013-05-27 (×2): 200 mg via ORAL
  Filled 2013-05-24 (×2): qty 10

## 2013-05-24 NOTE — Progress Notes (Signed)
Assessment/Plan: Principal Problem:   Weakness - I have reviewed notes of Dr. Leonie Man and Dr. [name]. Dr. Leonie Man and I discussed his case yesterday. I think he should definitely have the carotid surgery. Although he has been progressively weaker, he and his wife are fiercely (to a fault) wanting to remain independent. A large right Zachary stroke would make that impossible. As to timing, that is up to Dr. [name]. Although he may be a bit stronger after some rehab, I would not want the surgery done after he leaves SNF-rehab as I don't think his wife could care for him at home after the surgery. He is eager to get through SNF-rehab and home as quickly as possible and he is hoping it may be only 1-2 weeks. That may be unrealistic but if the surgery is postponed for several weeks and he is already home, then it will be harder to do as day surgery or overnight surgery (I hope this is making sense). Overall, I might be of the opinion that sooner is better than later and then he can concentrate on his rehab and getting home. However, as stated, timing is ultimately up to Dr. [name]. [note to SW/CM: if surgery is not to occur in this admit, patient should be ready for d/c on 05/25/13] Active Problems:   Hypertension - BP is acceptable.    Pulsus bigeminis   Dehydration - resolved   Diabetes mellitus - sugars are fine.    Acute ischemic stroke   Subjective: Feels okay today. No new issues. Not weak on left.   Objective:  Vital Signs: Filed Vitals:   05/23/13 1400 05/23/13 1727 05/23/13 2011 05/24/13 0613  BP: 185/76 153/77 169/65 177/51  Pulse: 50  69 72  Temp: 97.5 F (36.4 C)  97.8 F (36.6 C) 98.6 F (37 C)  TempSrc: Oral  Oral Oral  Resp: 20  18 18   Height:      Weight:      SpO2: 95%  97% 95%     EXAM: MOTOR: moves extremities normally; no CN abnormality   Intake/Output Summary (Last 24 hours) at 05/24/13 0624 Last data filed at 05/23/13 2130  Gross per 24 hour  Intake    460 ml  Output     950 ml  Net   -490 ml    Lab Results:  Recent Labs  05/22/13 0245  NA 142  K 3.8  CL 102  CO2 22  GLUCOSE 119*  BUN 13  CREATININE 0.96  CALCIUM 8.8   No results found for this basename: AST, ALT, ALKPHOS, BILITOT, PROT, ALBUMIN,  in the last 72 hours No results found for this basename: LIPASE, AMYLASE,  in the last 72 hours  Recent Labs  05/22/13 0245  WBC 21.4*  HGB 15.3  HCT 43.7  MCV 91.8  PLT 255    Recent Labs  05/21/13 1210  TROPONINI <0.30   BNP No results found for this basename: probnp   No results found for this basename: DDIMER,  in the last 72 hours No results found for this basename: HGBA1C,  in the last 72 hours No results found for this basename: CHOL, HDL, LDLCALC, TRIG, CHOLHDL, LDLDIRECT,  in the last 72 hours No results found for this basename: TSH, T4TOTAL, FREET3, T3FREE, THYROIDAB,  in the last 72 hours No results found for this basename: VITAMINB12, FOLATE, FERRITIN, TIBC, IRON, RETICCTPCT,  in the last 72 hours  Studies/Results: Ct Angio Head W/cm &/or [name] Cm  05/23/2013  CLINICAL DATA:  80 year old male with small right hemisphere acute infarct. Weakness. Abnormal appearance of the distal right vertebral artery, right ICA siphon and right ICA terminus. Initial encounter.  EXAM: CT ANGIOGRAPHY HEAD AND NECK  TECHNIQUE: Multidetector CT imaging of the head and neck was performed using the standard protocol during bolus administration of intravenous contrast. Multiplanar CT image reconstructions including MIPs were obtained to evaluate the vascular anatomy. Carotid stenosis measurements (when applicable) are obtained utilizing NASCET criteria, using the distal internal carotid diameter as the denominator.  CONTRAST:  36mL OMNIPAQUE IOHEXOL 350 MG/ML SOLN  COMPARISON:  MRI and intracranial MRA 12/09/2013.  MRA 05/01/2012.  FINDINGS: CTA HEAD FINDINGS  Stable cerebral volume. Stable gray-white matter differentiation throughout the Zachary. . Stable  hypodensity at the superior right para rolandic cortex likely corresponding to the recent diffusion finding (series 2, image 25). No acute intracranial hemorrhage identified. No ventriculomegaly. No midline shift, mass effect, or evidence of intracranial mass lesion. No abnormal enhancement identified. No acute osseous abnormality identified. Visualized scalp soft tissues are within normal limits.  VASCULAR FINDINGS:  Major intracranial venous structures appear to be enhancing.  The distal left vertebral artery is patent with calcified plaque resulting in mild to moderate stenosis. The left PICA origin is patent. The vertebrobasilar junction is patent.  The reconstituted distal right vertebral artery remains patent intracranially, but is diminutive. The right PICA origin is patent. The right vertebral is diminutive or stenotic be on the origin of PICA.  The basilar artery is patent without stenosis. SCA and right PCA origins are normal. Fetal type left PCA origin. Right posterior communicating artery diminutive or absent. Bilateral PCA branches within normal limits.  The left ICA siphon is patent but is moderately stenosed in the petrous segment (series 6, image 128) by what appears to be soft plaque (see also coronal image 97). This appearance is stable from the recent MRA. Beyond this level, the left ICA siphon is patent without hemodynamically significant stenosis despite extensive calcified plaque. The left ICA terminus is patent. The left ophthalmic and posterior communicating artery origins are within normal limits. Left MCA and ACA origins are normal.  Anterior communicating artery is diminutive or absent. Asymmetrically decreased enhancement of the right ACA, which remains patent. Normal left ACA.  The right ICA siphon is patent but demonstrates extensive calcified plaque in the cavernous segment. This results in moderate stenosis in the cavernous segment, stable from the recent MRA. Asymmetric decreased  distal right ICA enhancement. Ophthalmic artery origin within normal limits. Right ICA terminus remains patent. Right MCA and ACA origins are patent.  Mildly decreased enhancement of the proximal right ACA in conjunction with the decreased right ICA enhancement. Asymmetrically decreased enhancement of the right MCA, but no major MCA branch occlusion identified. The left MCA branches are normal.  Review of the MIP images confirms the above findings.  CTA NECK FINDINGS  Upper lungs demonstrate emphysema and dependent opacity most compatible with atelectasis. Mediastinal lipomatosis. Negative thyroid, larynx, pharynx, parapharyngeal spaces, retropharyngeal space, sublingual space, submandibular glands and parotid glands. Negative orbits soft tissues. Minor paranasal sinus mucosal thickening. No cervical lymphadenopathy. Cervical spine and left TMJ degeneration. Advanced cervical disc and endplate degeneration. At least mild degenerative spinal stenosis at C5-C6. No acute osseous abnormality identified.  VASCULAR FINDINGS:  Visible central pulmonary arteries are patent. Moderate calcified plaque throughout the aortic arch. Great vessel origins are patent.  No right CCA origin stenosis. Soft and calcified plaque throughout the right CCA, resulting an  up to 50 % stenosis with respect to the distal vessel at the level of the thyroid. The right carotid remains patent to the bifurcation. Abundant soft and calcified plaque in the right ICA origin and bulb resulting an very high-grade stenosis and a radiographic string sign over a distance of 1 cm. See series 6, images 81- 91. The cervical right ICA remains patent despite this finding but demonstrates additional soft plaque distally. Just proximal to the skullbase there is subsequent stenosis of up to 60-65 % with respect to the distal vessel.  Bulky calcified plaque at the right subclavian artery origin resulting in high-grade stenosis (up to 75 % with respect to the distal  vessel). Tortuous proximal right vertebral artery which is severely stenotic or occluded along a short segment of the distal right V1 (coronal image 148). The right vertebral artery is reconstituted at the C6 level, but is diminutive and poorly enhancing throughout the neck and to the skullbase. The vessel is again reconstituted from muscular branches just proximal to the skullbase (series 6, image 113).  Calcified plaque at the left CCA origin results in less than 50 % stenosis with respect to the distal vessel. Soft and calcified plaque throughout the left CCA. Left carotid bifurcation remains widely patent despite plaque. At the distal bulb there is bulky plaque resulting in stenosis of up to 60-65 % with respect to the distal vessel. Beyond this level, the cervical left ICA is tortuous but otherwise negative.  No proximal left subclavian artery stenosis despite plaque. Moderate stenosis at the left vertebral artery origin related to bulky calcified plaque (series 6, image 43). However, the left vertebral artery remains patent. There is a high-grade stenosis due to soft plaque at the left C6 transverse foramen level (series 6, image 68). Beyond this, the left vertebral artery is patent without additional stenosis to the skullbase.  Review of the MIP images confirms the above findings.  IMPRESSION: 1. Widespread severe atherosclerosis with multifocal significant arterial stenoses. 2. With regard to the carotids and anterior circulation: - 50% stenosis right CCA. - very high-grade stenosis with radiographic string sign at the right ICA origin over a distance of 1 cm. The right ICA remains patent despite this finding, but is hypo enhancing compared to the left side. - left ICA distal bulb stenosis up to 60 -65%. - extensive bilateral ICA siphon calcified plaque, resulting in bilateral siphon moderate stenosis. - hypoenhancing right ICA terminus, right ACA and right MCA, but no major intracranial branch occlusion. 3.  With regard to the vertebral arteries and posterior circulation: - 75% right subclavian artery origin stenosis. 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 -reconstituted distal right vertebral artery from muscular branches just below the skullbase. Right PICA is patent, the right vertebral be on the PICA is diminutive/stenotic. - moderate left vertebral artery origin stenosis. Severe stenosis of the proximal left V2 segment related to soft plaque at the C6 level. 4. Stable CT appearance of the Zachary. Salient findings by telephone with NP SHARON BIBY on 05/23/2013 at 13:26 .   Electronically Signed   By: Lars Pinks M.D.   On: 05/23/2013 13:27   Ct Angio Neck W/cm &/or [name]/cm  05/23/2013   CLINICAL DATA:  80 year old male with small right hemisphere acute infarct. Weakness. Abnormal appearance of the distal right vertebral artery, right ICA siphon and right ICA terminus. Initial encounter.  EXAM: CT ANGIOGRAPHY HEAD AND NECK  TECHNIQUE: Multidetector CT imaging of  the head and neck was performed using the standard protocol during bolus administration of intravenous contrast. Multiplanar CT image reconstructions including MIPs were obtained to evaluate the vascular anatomy. Carotid stenosis measurements (when applicable) are obtained utilizing NASCET criteria, using the distal internal carotid diameter as the denominator.  CONTRAST:  39mL OMNIPAQUE IOHEXOL 350 MG/ML SOLN  COMPARISON:  MRI and intracranial MRA 12/09/2013.  MRA 05/01/2012.  FINDINGS: CTA HEAD FINDINGS  Stable cerebral volume. Stable gray-white matter differentiation throughout the Zachary. . Stable hypodensity at the superior right para rolandic cortex likely corresponding to the recent diffusion finding (series 2, image 25). No acute intracranial hemorrhage identified. No ventriculomegaly. No midline shift, mass effect, or evidence of intracranial mass lesion. No abnormal enhancement identified. No acute  osseous abnormality identified. Visualized scalp soft tissues are within normal limits.  VASCULAR FINDINGS:  Major intracranial venous structures appear to be enhancing.  The distal left vertebral artery is patent with calcified plaque resulting in mild to moderate stenosis. The left PICA origin is patent. The vertebrobasilar junction is patent.  The reconstituted distal right vertebral artery remains patent intracranially, but is diminutive. The right PICA origin is patent. The right vertebral is diminutive or stenotic be on the origin of PICA.  The basilar artery is patent without stenosis. SCA and right PCA origins are normal. Fetal type left PCA origin. Right posterior communicating artery diminutive or absent. Bilateral PCA branches within normal limits.  The left ICA siphon is patent but is moderately stenosed in the petrous segment (series 6, image 128) by what appears to be soft plaque (see also coronal image 97). This appearance is stable from the recent MRA. Beyond this level, the left ICA siphon is patent without hemodynamically significant stenosis despite extensive calcified plaque. The left ICA terminus is patent. The left ophthalmic and posterior communicating artery origins are within normal limits. Left MCA and ACA origins are normal.  Anterior communicating artery is diminutive or absent. Asymmetrically decreased enhancement of the right ACA, which remains patent. Normal left ACA.  The right ICA siphon is patent but demonstrates extensive calcified plaque in the cavernous segment. This results in moderate stenosis in the cavernous segment, stable from the recent MRA. Asymmetric decreased distal right ICA enhancement. Ophthalmic artery origin within normal limits. Right ICA terminus remains patent. Right MCA and ACA origins are patent.  Mildly decreased enhancement of the proximal right ACA in conjunction with the decreased right ICA enhancement. Asymmetrically decreased enhancement of the right  MCA, but no major MCA branch occlusion identified. The left MCA branches are normal.  Review of the MIP images confirms the above findings.  CTA NECK FINDINGS  Upper lungs demonstrate emphysema and dependent opacity most compatible with atelectasis. Mediastinal lipomatosis. Negative thyroid, larynx, pharynx, parapharyngeal spaces, retropharyngeal space, sublingual space, submandibular glands and parotid glands. Negative orbits soft tissues. Minor paranasal sinus mucosal thickening. No cervical lymphadenopathy. Cervical spine and left TMJ degeneration. Advanced cervical disc and endplate degeneration. At least mild degenerative spinal stenosis at C5-C6. No acute osseous abnormality identified.  VASCULAR FINDINGS:  Visible central pulmonary arteries are patent. Moderate calcified plaque throughout the aortic arch. Great vessel origins are patent.  No right CCA origin stenosis. Soft and calcified plaque throughout the right CCA, resulting an up to 50 % stenosis with respect to the distal vessel at the level of the thyroid. The right carotid remains patent to the bifurcation. Abundant soft and calcified plaque in the right ICA origin and bulb resulting an very high-grade stenosis  and a radiographic string sign over a distance of 1 cm. See series 6, images 81- 91. The cervical right ICA remains patent despite this finding but demonstrates additional soft plaque distally. Just proximal to the skullbase there is subsequent stenosis of up to 60-65 % with respect to the distal vessel.  Bulky calcified plaque at the right subclavian artery origin resulting in high-grade stenosis (up to 75 % with respect to the distal vessel). Tortuous proximal right vertebral artery which is severely stenotic or occluded along a short segment of the distal right V1 (coronal image 148). The right vertebral artery is reconstituted at the C6 level, but is diminutive and poorly enhancing throughout the neck and to the skullbase. The vessel is  again reconstituted from muscular branches just proximal to the skullbase (series 6, image 113).  Calcified plaque at the left CCA origin results in less than 50 % stenosis with respect to the distal vessel. Soft and calcified plaque throughout the left CCA. Left carotid bifurcation remains widely patent despite plaque. At the distal bulb there is bulky plaque resulting in stenosis of up to 60-65 % with respect to the distal vessel. Beyond this level, the cervical left ICA is tortuous but otherwise negative.  No proximal left subclavian artery stenosis despite plaque. Moderate stenosis at the left vertebral artery origin related to bulky calcified plaque (series 6, image 43). However, the left vertebral artery remains patent. There is a high-grade stenosis due to soft plaque at the left C6 transverse foramen level (series 6, image 68). Beyond this, the left vertebral artery is patent without additional stenosis to the skullbase.  Review of the MIP images confirms the above findings.  IMPRESSION: 1. Widespread severe atherosclerosis with multifocal significant arterial stenoses. 2. With regard to the carotids and anterior circulation: - 50% stenosis right CCA. - very high-grade stenosis with radiographic string sign at the right ICA origin over a distance of 1 cm. The right ICA remains patent despite this finding, but is hypo enhancing compared to the left side. - left ICA distal bulb stenosis up to 60 -65%. - extensive bilateral ICA siphon calcified plaque, resulting in bilateral siphon moderate stenosis. - hypoenhancing right ICA terminus, right ACA and right MCA, but no major intracranial branch occlusion. 3. With regard to the vertebral arteries and posterior circulation: - 75% right subclavian artery origin stenosis. 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 -reconstituted distal right vertebral artery from muscular branches just below the skullbase. Right PICA  is patent, the right vertebral be on the PICA is diminutive/stenotic. - moderate left vertebral artery origin stenosis. Severe stenosis of the proximal left V2 segment related to soft plaque at the C6 level. 4. Stable CT appearance of the Zachary. Salient findings by telephone with NP SHARON BIBY on 05/23/2013 at 13:26 .   Electronically Signed   By: Lars Pinks M.D.   On: 05/23/2013 13:27   Mr Zachary [name] Head [name] Contrast  05/22/2013   CLINICAL DATA:  Weakness.  Rule out stroke.  EXAM: MRI HEAD WITHOUT CONTRAST  MRA HEAD WITHOUT CONTRAST  TECHNIQUE: Multiplanar, multiecho pulse sequences of the Zachary and surrounding structures were obtained without intravenous contrast. Angiographic images of the head were obtained using MRA technique without contrast.  COMPARISON:  Head CT 05/20/2013 and Zachary MRI/ MRA 05/01/2012  FINDINGS: MRI HEAD FINDINGS  Images are mildly to moderately degraded by motion artifact. Incidental note is made of a partially empty sella. There is  a small, acute infarct in the right frontoparietal region near the vertex likely involving the precentral gyrus. Remote lacunar infarcts are identified involving the right lentiform nucleus and right corona radiata. Small, remote cortical infarcts are noted involving the left greater than right parietal lobes. There is moderate cerebral atrophy with enlargement of the ventricles and sylvian fissures and proportionately less sulcal enlargement near the vertex, unchanged from prior MRI.  Foci of T2 hyperintensity within the periventricular white matter and brainstem do not appear significantly changed and are nonspecific but compatible with mild to moderate chronic small vessel ischemic disease. There is no evidence of intracranial hemorrhage, mass, midline shift, or extra-axial fluid collection. Prior bilateral cataract surgery is noted. Paranasal sinuses and mastoid air cells are clear. Moderate pannus is present at C1-2.  MRA HEAD FINDINGS  Images are mildly to  moderately degraded by motion artifact. The visualized distal right vertebral artery appears very small in caliber with diminished flow related enhancement and vessel irregularity, more pronounced than on the prior MRI although there is more motion artifact on the current study. The visualized distal left vertebral artery is patent and dominant without evidence of stenosis. PICA origins are patent bilaterally. SCA origins are patent. Basilar artery is patent without stenosis. There is a fetal origin of the left PCA. There is a mild, focal stenosis of the P1 segment of the right PCA, not evident on the prior study.  Internal carotid arteries are patent from skullbase to carotid termini. The intracranial right ICA is smaller in caliber than the left ICA with less flow related enhancement than on the prior study. The right carotid siphon and proximal supraclinoid ICA appear moderately narrowed. The right A1 segment is mildly irregular without focal stenosis. ACAs are otherwise unremarkable. Diminished flow related enhancement is present in the right MCA near the bifurcation, although the more distal branches appear patent and of relatively normal caliber. Left MCA is unremarkable. No intracranial aneurysm is identified.  IMPRESSION: 1. Small, acute posterior right frontal infarct. 2. Unchanged appearance of cerebral atrophy and chronic small vessel ischemic disease with remote lacunar infarcts as above. 3. No evidence of proximal intracranial arterial occlusion. There is diminished flow related enhancement and caliber of the distal right vertebral artery, intracranial right ICA, and right MCA near the bifurcation, which could reflect worsening intracranial atherosclerotic disease or be at least partly artifactual.   Electronically Signed   By: Logan Bores   On: 05/22/2013 08:28   Mr Zachary [name] Contrast  05/22/2013   CLINICAL DATA:  Weakness.  Rule out stroke.  EXAM: MRI HEAD WITHOUT CONTRAST  MRA HEAD WITHOUT CONTRAST   TECHNIQUE: Multiplanar, multiecho pulse sequences of the Zachary and surrounding structures were obtained without intravenous contrast. Angiographic images of the head were obtained using MRA technique without contrast.  COMPARISON:  Head CT 05/20/2013 and Zachary MRI/ MRA 05/01/2012  FINDINGS: MRI HEAD FINDINGS  Images are mildly to moderately degraded by motion artifact. Incidental note is made of a partially empty sella. There is a small, acute infarct in the right frontoparietal region near the vertex likely involving the precentral gyrus. Remote lacunar infarcts are identified involving the right lentiform nucleus and right corona radiata. Small, remote cortical infarcts are noted involving the left greater than right parietal lobes. There is moderate cerebral atrophy with enlargement of the ventricles and sylvian fissures and proportionately less sulcal enlargement near the vertex, unchanged from prior MRI.  Foci of T2 hyperintensity within the periventricular white matter and brainstem  do not appear significantly changed and are nonspecific but compatible with mild to moderate chronic small vessel ischemic disease. There is no evidence of intracranial hemorrhage, mass, midline shift, or extra-axial fluid collection. Prior bilateral cataract surgery is noted. Paranasal sinuses and mastoid air cells are clear. Moderate pannus is present at C1-2.  MRA HEAD FINDINGS  Images are mildly to moderately degraded by motion artifact. The visualized distal right vertebral artery appears very small in caliber with diminished flow related enhancement and vessel irregularity, more pronounced than on the prior MRI although there is more motion artifact on the current study. The visualized distal left vertebral artery is patent and dominant without evidence of stenosis. PICA origins are patent bilaterally. SCA origins are patent. Basilar artery is patent without stenosis. There is a fetal origin of the left PCA. There is a mild,  focal stenosis of the P1 segment of the right PCA, not evident on the prior study.  Internal carotid arteries are patent from skullbase to carotid termini. The intracranial right ICA is smaller in caliber than the left ICA with less flow related enhancement than on the prior study. The right carotid siphon and proximal supraclinoid ICA appear moderately narrowed. The right A1 segment is mildly irregular without focal stenosis. ACAs are otherwise unremarkable. Diminished flow related enhancement is present in the right MCA near the bifurcation, although the more distal branches appear patent and of relatively normal caliber. Left MCA is unremarkable. No intracranial aneurysm is identified.  IMPRESSION: 1. Small, acute posterior right frontal infarct. 2. Unchanged appearance of cerebral atrophy and chronic small vessel ischemic disease with remote lacunar infarcts as above. 3. No evidence of proximal intracranial arterial occlusion. There is diminished flow related enhancement and caliber of the distal right vertebral artery, intracranial right ICA, and right MCA near the bifurcation, which could reflect worsening intracranial atherosclerotic disease or be at least partly artifactual.   Electronically Signed   By: Logan Bores   On: 05/22/2013 08:28   Medications: Medications administered in the last 24 hours reviewed.  Current Medication List reviewed.    LOS: 4 days   [facility] @ [facility] [phone]) 05/24/2013, 6:24 AM

## 2013-05-24 NOTE — Progress Notes (Signed)
CSW (Clinical Education officer, museum) updated facility on pt case. CSW informed that pt dc may be for tomorrow. CSW to continue to work with pt and update facility as needed.  [place], [place]

## 2013-05-24 NOTE — Care Management Note (Addendum)
    Page 1 of 1   05/30/2013     4:47:21 PM   CARE MANAGEMENT NOTE 05/30/2013  Patient:  Zachary [name], Zachary [name]   Account Number:  [id number]  Date Initiated:  05/24/2013  Documentation initiated by:  GRAVES-BIGELOW,BRENDA  Subjective/Objective Assessment:   Pt admitted for weakness. MRI- revealed Small, acute posterior right frontal infarct.     Action/Plan:   Plan will be for d/c to SNF when medically stable.  CSW to assist with disposition needs.   Anticipated DC Date:  05/30/2013   Anticipated DC Plan:  SKILLED NURSING FACILITY  In-house referral  Clinical Social Worker      DC Planning Services  CM consult      Choice offered to / List presented to:             Status of service:  Completed, signed off Medicare Important Message given?   (If response is "NO", the following Medicare IM given date fields will be blank) Date Medicare IM given:   Date Additional Medicare IM given:    Discharge Disposition:  [place]  Per UR Regulation:  Reviewed for med. necessity/level of care/duration of stay  If discussed at [place] of Stay Meetings, dates discussed:    Comments:  05/30/13 Almyra Free Cordell Guercio,RN,BSN [phone] PT DISCHARGED TO SNF TODAY, PER CSW ARRANGEMENTS.  05/28/13- 1600- Marvetta Gibbons RN, BSN [phone] Pt s/p CEA today- plan to d/c to SNF when medically stable- CSW following

## 2013-05-24 NOTE — Progress Notes (Signed)
Stroke Team Progress Note  HISTORY Zachary [name] is an 80 y.o. male who reports that he has had a slowly progressive weakness that has occurred over the past 2 weeks. It is now to the point that he is no longer going up stairs because he feels unsafe. He feels unsafe to drive as well. Has extensive difficulty getting up from the toilet. Has generalized achiness. Reports tingling in his fingertips that has been present for about a week. Also reports that he has been constipated for about a week which is unusual for him. He has not had any bladder issues. When he walks feels as if his legs are bucking on him and he feels off balance. He does not describe vertigo. Patient was not administerd TPA secondary to delay in arrival. He was admitted for further evaluation and treatment.  SUBJECTIVE No family is at the bedside.  Patient reports he has been feeling poorly for the past 3 months. He says he told the doctors and all they said is "You are not 18 any more".  OBJECTIVE Most recent Vital Signs: Filed Vitals:   05/23/13 1400 05/23/13 1727 05/23/13 2011 05/24/13 0613  BP: 185/76 153/77 169/65 177/51  Pulse: 50  69 72  Temp: 97.5 F (36.4 C)  97.8 F (36.6 C) 98.6 F (37 C)  TempSrc: Oral  Oral Oral  Resp: 20  18 18   Height:      Weight:      SpO2: 95%  97% 95%   CBG (last 3)   Recent Labs  05/23/13 1604 05/23/13 2025 05/24/13 0723  GLUCAP 156* 115* 108*    IV Fluid Intake:     MEDICATIONS  . aspirin  300 mg Rectal Daily   Or  . aspirin  325 mg Oral Daily  . docusate sodium  100 mg Oral BID  . insulin aspart  0-9 Units Subcutaneous TID WC & HS  . metoprolol tartrate  12.5 mg Oral BID  . pantoprazole  40 mg Oral Daily  . pentoxifylline  400 mg Oral TID WC  . senna-docusate  1 tablet Oral QHS  . tamsulosin  0.4 mg Oral Daily   PRN:  acetaminophen, acetaminophen, labetalol, polyethylene glycol, temazepam  Diet:  Carb Control thin liquids Activity:  OOB with assistance DVT  Prophylaxis:  scds ordered  CLINICALLY SIGNIFICANT STUDIES Basic Metabolic Panel:   Recent Labs Lab 05/20/13 1734 05/20/13 1850 05/22/13 0245  NA 139  --  142  K 4.1  --  3.8  CL 99  --  102  CO2 23  --  22  GLUCOSE 107*  --  119*  BUN 16  --  13  CREATININE 0.97  --  0.96  CALCIUM 9.1  --  8.8  MG  --  1.9  --    Liver Function Tests:   Recent Labs Lab 05/20/13 1850  AST 31  ALT 15  ALKPHOS 54  BILITOT 0.6  PROT 7.2  ALBUMIN 3.7   CBC:  Recent Labs Lab 05/20/13 0047  05/21/13 0323 05/22/13 0245  WBC  --   < > 18.3* 21.4*  NEUTROABS 13.5*  --  13.1*  --   HGB  --   < > 14.9 15.3  HCT  --   < > 43.2 43.7  MCV  --   < > 91.9 91.8  PLT  --   < > 238 255  < > = values in this interval not displayed. Coagulation:   Recent  Labs Lab 05/20/13 0047  LABPROT 12.4  INR 0.94   Cardiac Enzymes:   Recent Labs Lab 05/20/13 0047 05/21/13 0323 05/21/13 0620 05/21/13 1210  CKTOTAL 621*  --   --   --   TROPONINI  --  <0.30 <0.30 <0.30   Urinalysis:   Recent Labs Lab 05/20/13 2040  COLORURINE YELLOW  LABSPEC 1.013  PHURINE 6.0  GLUCOSEU NEGATIVE  HGBUR NEGATIVE  BILIRUBINUR NEGATIVE  KETONESUR 15*  PROTEINUR NEGATIVE  UROBILINOGEN 0.2  NITRITE NEGATIVE  LEUKOCYTESUR NEGATIVE   Lipid Panel    Component Value Date/Time   CHOL 186 05/21/2013 0323   TRIG 128 05/21/2013 0323   HDL 32* 05/21/2013 0323   CHOLHDL 5.8 05/21/2013 0323   VLDL 26 05/21/2013 0323   LDLCALC 128* 05/21/2013 0323   HgbA1C  Lab Results  Component Value Date   HGBA1C 6.8* 05/21/2013    Urine Drug Screen:   No results found for this basename: labopia,  cocainscrnur,  labbenz,  amphetmu,  thcu,  labbarb    Alcohol Level: No results found for this basename: ETH,  in the last 168 hours   CT of the brain  05/20/2013 No acute intracranial process.  Mild sulcal effacement of the convexities, disproportionate to the degree of sulcal enlargement can be seen with normal pressure hydrocephalus.   Moderate to severe white matter changes suggest chronic small vessel ischemic disease with remote right basal ganglia lacunar infarct, similar.   MRI of the brain  05/22/2013   1. Small, acute posterior right frontal infarct. 2. Unchanged appearance of cerebral atrophy and chronic small vessel ischemic disease with remote lacunar infarcts   MRA of the brain  05/22/2013    No evidence of proximal intracranial arterial occlusion. There is diminished flow related enhancement and caliber of the distal right vertebral artery, intracranial right ICA, and right MCA near the bifurcation, which could reflect worsening intracranial atherosclerotic disease or be at least partly artifactual.     2D Echocardiogram  EF 55-60% with no source of embolus.   Carotid Doppler  Right: Greater than 80% internal carotid artery stenosis. Unable to evaluate the vertebral due to movement. Left : 40% to 59 % ICA stenosis upper end of scale by systolic velocities. Increase may possible be due in part to compensatory flow from the stenosis on the right. Vertebral artery flow could not be evaluated due to movement and snoring  CXR  05/20/2013 No acute disease.  EKG  sinus tachycardia. For complete results please see formal report.   Therapy Recommendations SNF  Physical Exam   Elderly Caucasian male not in distress.Awake alert. Afebrile. Head is nontraumatic. Neck is supple with soft bilateral carotid bruits. Hearing is diminished bilaterallyl. Cardiac exam no murmur or gallop. Lungs are clear to auscultation. Distal pulses are well felt. Neurological Exam ;  Awake  Alert oriented x 3. Diminished attention, registration and recall. Normal speech and language.eye movements full without nystagmus.fundi were not visualized. Vision acuity and fields appear normal. Hearing is diminished bilaterally. Palatal movements are normal. Face symmetric. Tongue midline. Normal strength, tone, reflexes and coordination. Normal sensation. Gait  deferred.  ASSESSMENT Zachary [name] is a 80 y.o. male presenting with slowly progressive weakness. Imaging confirms a small right posterior frontal infarct with the setting of R ICA stenosis. Infarct felt to be due to large vessel disease. On aspirin 81 mg orally every day and trental prior to admission. Now on aspirin 325 mg orally every day for secondary stroke  prevention. Given patients functional status, feel he would be a good candidate for revascularization.   Diabetes, HgbA1c 6.8, goal < 7.0  PVD  Prostate cancer  Family hx stroke (father)  Bigeminy & Trigeminy  Bilateral ICA stenosis (R >80%, L 40-59%)  Hospital day # 4  TREATMENT/PLAN  Continue aspirin 325 mg orally every day for secondary stroke prevention. Consider plavix; dr Leonie Man will discuss with Early.  Dr. Leonie Man prefers to do the surgery sooner rather than later, definitely prior to rehab. Attempted to call Dr. [name]. He is in OR; will return call when he is out.  Burnetta Sabin, MSN, RN, ANVP-BC, ANP-BC, [facility] Pager: [phone] 05/24/2013 9:18 AM  I have personally obtained a history, examined the patient, evaluated imaging results, and formulated the assessment and plan of care. I agree with the above.  Antony Contras, MD

## 2013-05-25 LAB — GLUCOSE, CAPILLARY
GLUCOSE-CAPILLARY: 111 mg/dL — AB (ref 70–99)
Glucose-Capillary: 125 mg/dL — ABNORMAL HIGH (ref 70–99)
Glucose-Capillary: 149 mg/dL — ABNORMAL HIGH (ref 70–99)
Glucose-Capillary: 119 mg/dL — ABNORMAL HIGH (ref 70–99)

## 2013-05-25 LAB — SURGICAL PCR SCREEN
MRSA, PCR: NEGATIVE
Staphylococcus aureus: NEGATIVE

## 2013-05-25 MED ORDER — METOPROLOL TARTRATE 25 MG PO TABS: 12.5000 mg | ORAL_TABLET | Freq: Two times a day (BID) | ORAL | Status: DC

## 2013-05-25 MED ORDER — SIMVASTATIN 20 MG PO TABS
20.0000 mg | ORAL_TABLET | Freq: Every day | ORAL | Status: DC
  Administered 2013-05-25 – 2013-05-29 (×5): 20 mg via ORAL
  Filled 2013-05-25 (×8): qty 1

## 2013-05-25 MED ORDER — ASPIRIN 325 MG PO TABS: 325.0000 mg | ORAL_TABLET | Freq: Every day | ORAL | Status: DC

## 2013-05-25 MED ORDER — STROKE: EARLY STAGES OF RECOVERY BOOK
Freq: Once | Status: AC
  Administered 2013-05-25: 08:00:00
  Filled 2013-05-25: qty 1

## 2013-05-25 NOTE — Progress Notes (Signed)
Stroke Team Progress Note  HISTORY Zachary [name] Zachary [name] is an 80 y.o. male who reports that he has had a slowly progressive weakness that has occurred over the past 2 weeks. It is now to the point that he is no longer going up stairs because he feels unsafe. He feels unsafe to drive as well. Has extensive difficulty getting up from the toilet. Has generalized achiness. Reports tingling in his fingertips that has been present for about a week. Also reports that he has been constipated for about a week which is unusual for him. He has not had any bladder issues. When he walks feels as if his legs are bucking on him and he feels off balance. He does not describe vertigo. Patient was not administerd TPA secondary to delay in arrival. He was admitted for further evaluation and treatment.  SUBJECTIVE D/W patient and Dr Early plan to do Rt CEA on Monday prior to SNF rehab  OBJECTIVE Most recent Vital Signs: Filed Vitals:   05/24/13 2127 05/25/13 0000 05/25/13 0400 05/25/13 0723  BP: 172/71 162/58 154/68 128/70  Pulse: 69 65 70 68  Temp: 98.6 F (37 C) 98.6 F (37 C) 97.4 F (36.3 C) 97.7 F (36.5 C)  TempSrc: Oral  Oral Oral  Resp: 18 18 18 16   Height:      Weight:      SpO2: 98% 93% 93% 93%   CBG (last 3)   Recent Labs  05/24/13 1629 05/24/13 2153 05/25/13 0721  GLUCAP 151* 116* 111*    IV Fluid Intake:     MEDICATIONS  .  stroke: mapping our early stages of recovery book   Does not apply Once  . aspirin  300 mg Rectal Daily   Or  . aspirin  325 mg Oral Daily  . docusate sodium  100 mg Oral BID  . insulin aspart  0-9 Units Subcutaneous TID WC & HS  . metoprolol tartrate  12.5 mg Oral BID  . pantoprazole  40 mg Oral Daily  . pentoxifylline  400 mg Oral TID WC  . senna-docusate  1 tablet Oral QHS  . tamsulosin  0.4 mg Oral Daily   PRN:  acetaminophen, acetaminophen, guaifenesin, labetalol, polyethylene glycol, temazepam  Diet:  Carb Control thin liquids Activity:  OOB with  assistance DVT Prophylaxis:  scds ordered  CLINICALLY SIGNIFICANT STUDIES Basic Metabolic Panel:   Recent Labs Lab 05/20/13 1734 05/20/13 1850 05/22/13 0245  NA 139  --  142  K 4.1  --  3.8  CL 99  --  102  CO2 23  --  22  GLUCOSE 107*  --  119*  BUN 16  --  13  CREATININE 0.97  --  0.96  CALCIUM 9.1  --  8.8  MG  --  1.9  --    Liver Function Tests:   Recent Labs Lab 05/20/13 1850  AST 31  ALT 15  ALKPHOS 54  BILITOT 0.6  PROT 7.2  ALBUMIN 3.7   CBC:  Recent Labs Lab 05/20/13 0047  05/21/13 0323 05/22/13 0245  WBC  --   < > 18.3* 21.4*  NEUTROABS 13.5*  --  13.1*  --   HGB  --   < > 14.9 15.3  HCT  --   < > 43.2 43.7  MCV  --   < > 91.9 91.8  PLT  --   < > 238 255  < > = values in this interval not displayed. Coagulation:  Recent Labs Lab 05/20/13 0047  LABPROT 12.4  INR 0.94   Cardiac Enzymes:   Recent Labs Lab 05/20/13 0047 05/21/13 0323 05/21/13 0620 05/21/13 1210  CKTOTAL 621*  --   --   --   TROPONINI  --  <0.30 <0.30 <0.30   Urinalysis:   Recent Labs Lab 05/20/13 2040  COLORURINE YELLOW  LABSPEC 1.013  PHURINE 6.0  GLUCOSEU NEGATIVE  HGBUR NEGATIVE  BILIRUBINUR NEGATIVE  KETONESUR 15*  PROTEINUR NEGATIVE  UROBILINOGEN 0.2  NITRITE NEGATIVE  LEUKOCYTESUR NEGATIVE   Lipid Panel    Component Value Date/Time   CHOL 186 05/21/2013 0323   TRIG 128 05/21/2013 0323   HDL 32* 05/21/2013 0323   CHOLHDL 5.8 05/21/2013 0323   VLDL 26 05/21/2013 0323   LDLCALC 128* 05/21/2013 0323   HgbA1C  Lab Results  Component Value Date   HGBA1C 6.8* 05/21/2013    Urine Drug Screen:   No results found for this basename: labopia,  cocainscrnur,  labbenz,  amphetmu,  thcu,  labbarb    Alcohol Level: No results found for this basename: ETH,  in the last 168 hours   CT of the brain  05/20/2013 No acute intracranial process.  Mild sulcal effacement of the convexities, disproportionate to the degree of sulcal enlargement can be seen with normal pressure  hydrocephalus.  Moderate to severe white matter changes suggest chronic small vessel ischemic disease with remote right basal ganglia lacunar infarct, similar.   MRI of the brain  05/22/2013   1. Small, acute posterior right frontal infarct. 2. Unchanged appearance of cerebral atrophy and chronic small vessel ischemic disease with remote lacunar infarcts   MRA of the brain  05/22/2013    No evidence of proximal intracranial arterial occlusion. There is diminished flow related enhancement and caliber of the distal right vertebral artery, intracranial right ICA, and right MCA near the bifurcation, which could reflect worsening intracranial atherosclerotic disease or be at least partly artifactual.     2D Echocardiogram  EF 55-60% with no source of embolus.   Carotid Doppler  Right: Greater than 80% internal carotid artery stenosis. Unable to evaluate the vertebral due to movement. Left : 40% to 59 % ICA stenosis upper end of scale by systolic velocities. Increase may possible be due in part to compensatory flow from the stenosis on the right. Vertebral artery flow could not be evaluated due to movement and snoring  CXR  05/20/2013 No acute disease.  EKG  sinus tachycardia. For complete results please see formal report.   Therapy Recommendations SNF  Physical Exam   Elderly Caucasian male not in distress.Awake alert. Afebrile. Head is nontraumatic. Neck is supple with soft bilateral carotid bruits. Hearing is diminished bilaterallyl. Cardiac exam no murmur or gallop. Lungs are clear to auscultation. Distal pulses are well felt. Neurological Exam ;  Awake  Alert oriented x 3. Diminished attention, registration and recall. Normal speech and language.eye movements full without nystagmus.fundi were not visualized. Vision acuity and fields appear normal. Hearing is diminished bilaterally. Palatal movements are normal. Face symmetric. Tongue midline. Normal strength, tone, reflexes and coordination. Normal  sensation. Gait deferred.  ASSESSMENT Mr. Zachary [name] is a 80 y.o. male presenting with slowly progressive weakness. Imaging confirms a small right posterior frontal infarct with the setting of R ICA stenosis. Infarct felt to be due to large vessel disease. On aspirin 81 mg orally every day and trental prior to admission. Now on aspirin 325 mg orally every day for secondary  stroke prevention. Given patients functional status, feel he would be a good candidate for revascularization.   Diabetes, HgbA1c 6.8, goal < 7.0 Hyperlipidemia, LDL 128, on no statin PTA, now on no statin, goal LDL < 100 (< 70 for diabetics) Leukocytosis, wbc   PVD  Prostate cancer  Family hx stroke (father)  Bigeminy & Trigeminy  Bilateral ICA stenosis (R >80%, L 40-59%)  Hospital day # 5  TREATMENT/PLAN  Continue aspirin 325 mg orally every day for secondary stroke prevention. No plavix at this time after discussions with Dr. [name].  Dr. Leonie Man discussed with Dr. [name] yesterday. Plan for R CEA Monday Add zocor 20 mg for elevated LDL  Antony Contras, MD

## 2013-05-25 NOTE — Progress Notes (Signed)
Stable overnight. No new neuro findings on exam. VS and other notes reviewed.  Note Dr. Clydene Fake recommendation to try to do surgery before rehab. As my note from yesterday indicates, I tend to agree. Will await Dr. Luther Parody decision.   I can d/c him to SNF rehab today if surgery is to be delayed a couple of weeks.

## 2013-05-25 NOTE — Progress Notes (Signed)
Subjective: Interval History: none.. no new neurologic deficits.  Objective: Vital signs in last 24 hours: Temp:  [97.4 F (36.3 C)-98.6 F (37 C)] 97.7 F (36.5 C) (02/06 0723) Pulse Rate:  [65-78] 68 (02/06 0723) Resp:  [16-20] 16 (02/06 0723) BP: (128-172)/(58-71) 128/70 mmHg (02/06 0723) SpO2:  [93 %-98 %] 93 % (02/06 0723)  Intake/Output from previous day: 02/05 0701 - 02/06 0700 In: -  Out: 1101 [Urine:1100; Stool:1] Intake/Output this shift:    Alert oriented, no focal weakness.  Lab Results: No results found for this basename: WBC, HGB, HCT, PLT,  in the last 72 hours BMET No results found for this basename: NA, K, CL, CO2, GLUCOSE, BUN, CREATININE, CALCIUM,  in the last 72 hours  Studies/Results: Ct Angio Head W/cm &/or Wo Cm  05/23/2013   CLINICAL DATA:  80 year old male with small right hemisphere acute infarct. Weakness. Abnormal appearance of the distal right vertebral artery, right ICA siphon and right ICA terminus. Initial encounter.  EXAM: CT ANGIOGRAPHY HEAD AND NECK  TECHNIQUE: Multidetector CT imaging of the head and neck was performed using the standard protocol during bolus administration of intravenous contrast. Multiplanar CT image reconstructions including MIPs were obtained to evaluate the vascular anatomy. Carotid stenosis measurements (when applicable) are obtained utilizing NASCET criteria, using the distal internal carotid diameter as the denominator.  CONTRAST:  30mL OMNIPAQUE IOHEXOL 350 MG/ML SOLN  COMPARISON:  MRI and intracranial MRA 12/09/2013.  MRA 05/01/2012.  FINDINGS: CTA HEAD FINDINGS  Stable cerebral volume. Stable gray-white matter differentiation throughout the brain. . Stable hypodensity at the superior right para rolandic cortex likely corresponding to the recent diffusion finding (series 2, image 25). No acute intracranial hemorrhage identified. No ventriculomegaly. No midline shift, mass effect, or evidence of intracranial mass lesion. No  abnormal enhancement identified. No acute osseous abnormality identified. Visualized scalp soft tissues are within normal limits.  VASCULAR FINDINGS:  Major intracranial venous structures appear to be enhancing.  The distal left vertebral artery is patent with calcified plaque resulting in mild to moderate stenosis. The left PICA origin is patent. The vertebrobasilar junction is patent.  The reconstituted distal right vertebral artery remains patent intracranially, but is diminutive. The right PICA origin is patent. The right vertebral is diminutive or stenotic be on the origin of PICA.  The basilar artery is patent without stenosis. SCA and right PCA origins are normal. Fetal type left PCA origin. Right posterior communicating artery diminutive or absent. Bilateral PCA branches within normal limits.  The left ICA siphon is patent but is moderately stenosed in the petrous segment (series 6, image 128) by what appears to be soft plaque (see also coronal image 97). This appearance is stable from the recent MRA. Beyond this level, the left ICA siphon is patent without hemodynamically significant stenosis despite extensive calcified plaque. The left ICA terminus is patent. The left ophthalmic and posterior communicating artery origins are within normal limits. Left MCA and ACA origins are normal.  Anterior communicating artery is diminutive or absent. Asymmetrically decreased enhancement of the right ACA, which remains patent. Normal left ACA.  The right ICA siphon is patent but demonstrates extensive calcified plaque in the cavernous segment. This results in moderate stenosis in the cavernous segment, stable from the recent MRA. Asymmetric decreased distal right ICA enhancement. Ophthalmic artery origin within normal limits. Right ICA terminus remains patent. Right MCA and ACA origins are patent.  Mildly decreased enhancement of the proximal right ACA in conjunction with the decreased right ICA  enhancement.  Asymmetrically decreased enhancement of the right MCA, but no major MCA branch occlusion identified. The left MCA branches are normal.  Review of the MIP images confirms the above findings.  CTA NECK FINDINGS  Upper lungs demonstrate emphysema and dependent opacity most compatible with atelectasis. Mediastinal lipomatosis. Negative thyroid, larynx, pharynx, parapharyngeal spaces, retropharyngeal space, sublingual space, submandibular glands and parotid glands. Negative orbits soft tissues. Minor paranasal sinus mucosal thickening. No cervical lymphadenopathy. Cervical spine and left TMJ degeneration. Advanced cervical disc and endplate degeneration. At least mild degenerative spinal stenosis at C5-C6. No acute osseous abnormality identified.  VASCULAR FINDINGS:  Visible central pulmonary arteries are patent. Moderate calcified plaque throughout the aortic arch. Great vessel origins are patent.  No right CCA origin stenosis. Soft and calcified plaque throughout the right CCA, resulting an up to 50 % stenosis with respect to the distal vessel at the level of the thyroid. The right carotid remains patent to the bifurcation. Abundant soft and calcified plaque in the right ICA origin and bulb resulting an very high-grade stenosis and a radiographic string sign over a distance of 1 cm. See series 6, images 81- 91. The cervical right ICA remains patent despite this finding but demonstrates additional soft plaque distally. Just proximal to the skullbase there is subsequent stenosis of up to 60-65 % with respect to the distal vessel.  Bulky calcified plaque at the right subclavian artery origin resulting in high-grade stenosis (up to 75 % with respect to the distal vessel). Tortuous proximal right vertebral artery which is severely stenotic or occluded along a short segment of the distal right V1 (coronal image 148). The right vertebral artery is reconstituted at the C6 level, but is diminutive and poorly enhancing  throughout the neck and to the skullbase. The vessel is again reconstituted from muscular branches just proximal to the skullbase (series 6, image 113).  Calcified plaque at the left CCA origin results in less than 50 % stenosis with respect to the distal vessel. Soft and calcified plaque throughout the left CCA. Left carotid bifurcation remains widely patent despite plaque. At the distal bulb there is bulky plaque resulting in stenosis of up to 60-65 % with respect to the distal vessel. Beyond this level, the cervical left ICA is tortuous but otherwise negative.  No proximal left subclavian artery stenosis despite plaque. Moderate stenosis at the left vertebral artery origin related to bulky calcified plaque (series 6, image 43). However, the left vertebral artery remains patent. There is a high-grade stenosis due to soft plaque at the left C6 transverse foramen level (series 6, image 68). Beyond this, the left vertebral artery is patent without additional stenosis to the skullbase.  Review of the MIP images confirms the above findings.  IMPRESSION: 1. Widespread severe atherosclerosis with multifocal significant arterial stenoses. 2. With regard to the carotids and anterior circulation: - 50% stenosis right CCA. - very high-grade stenosis with radiographic string sign at the right ICA origin over a distance of 1 cm. The right ICA remains patent despite this finding, but is hypo enhancing compared to the left side. - left ICA distal bulb stenosis up to 60 -65%. - extensive bilateral ICA siphon calcified plaque, resulting in bilateral siphon moderate stenosis. - hypoenhancing right ICA terminus, right ACA and right MCA, but no major intracranial branch occlusion. 3. With regard to the vertebral arteries and posterior circulation: - 75% right subclavian artery origin stenosis. 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 -reconstituted distal right vertebral artery from  muscular branches just below the skullbase. Right PICA is patent, the right vertebral be on the PICA is diminutive/stenotic. - moderate left vertebral artery origin stenosis. Severe stenosis of the proximal left V2 segment related to soft plaque at the C6 level. 4. Stable CT appearance of the brain. Salient findings by telephone with NP SHARON BIBY on 05/23/2013 at 13:26 .   Electronically Signed   By: Lars Pinks M.D.   On: 05/23/2013 13:27   Dg Chest 2 View  05/20/2013   CLINICAL DATA:  Generalized weakness.  Shortness of breath.  EXAM: CHEST  2 VIEW  COMPARISON:  PA and lateral chest 05/15/2013.  FINDINGS: Lungs are clear. Heart size is normal. No pneumothorax or pleural effusion.  IMPRESSION: No acute disease.   Electronically Signed   By: Inge Rise M.D.   On: 05/20/2013 19:27   Dg Chest 2 View  05/15/2013   CLINICAL DATA:  Shortness of breath, fatigue  EXAM: CHEST  2 VIEW  COMPARISON:  DG CHEST 1V PORT dated 05/08/2012  FINDINGS: Low lung volumes. There is blunting of the left costophrenic angle. No focal regions consolidation nor focal infiltrates. Cardiac silhouette moderately enlarged. Aorta is tortuous. Atherosclerotic calcifications identified within the aorta. The osseous structures are unremarkable.  IMPRESSION: Blunting of the left costophrenic angle consistent with a trace effusion. Otherwise no evidence of acute cardiopulmonary disease.   Electronically Signed   By: Margaree Mackintosh M.D.   On: 05/15/2013 15:08   Ct Head Wo Contrast  05/20/2013   CLINICAL DATA:  Weakness worsening over 3 days. Cough, constipation and body aches.  EXAM: CT HEAD WITHOUT CONTRAST  TECHNIQUE: Contiguous axial images were obtained from the base of the skull through the vertex without intravenous contrast.  COMPARISON:  MRI of the brain May 01, 2012  FINDINGS: Moderate ventriculomegaly, out of proportion of the degree of sulcal enlargement which is mildly effaced at the convexities. No intraparenchymal hemorrhage,  mass effect nor midline shift. Confluent supratentorial white matter hypodensities are within normal range for patient's age and though non-specific suggest sequelae of chronic small vessel ischemic disease. No acute large vascular territory infarcts. Remote right basal ganglia globus pallidus lacunar infarct.  No abnormal extra-axial fluid collections. Basal cisterns are patent. Moderate calcific atherosclerosis of the carotid siphons and included vertebral arteries.  No skull fracture. Trace sphenoid ethmoidal mucosal thickening without paranasal sinus air-fluid levels. The mastoid air cells are well aerated. Severe left temporomandibular osteoarthrosis. Soft tissue within the external auditory canals may reflect cerumen. The included ocular globes and orbital contents are non-suspicious. Status post bilateral ocular lens implants.  IMPRESSION: No acute intracranial process.  Mild sulcal effacement of the convexities, disproportionate to the degree of sulcal enlargement can be seen with normal pressure hydrocephalus.  Moderate to severe white matter changes suggest chronic small vessel ischemic disease with remote right basal ganglia lacunar infarct, similar.   Electronically Signed   By: Elon Alas   On: 05/20/2013 23:25   Ct Angio Neck W/cm &/or Wo/cm  05/23/2013   CLINICAL DATA:  80 year old male with small right hemisphere acute infarct. Weakness. Abnormal appearance of the distal right vertebral artery, right ICA siphon and right ICA terminus. Initial encounter.  EXAM: CT ANGIOGRAPHY HEAD AND NECK  TECHNIQUE: Multidetector CT imaging of the head and neck was performed using the standard protocol during bolus administration of intravenous contrast. Multiplanar CT image reconstructions including MIPs were obtained to evaluate the vascular  anatomy. Carotid stenosis measurements (when applicable) are obtained utilizing NASCET criteria, using the distal internal carotid diameter as the denominator.   CONTRAST:  46mL OMNIPAQUE IOHEXOL 350 MG/ML SOLN  COMPARISON:  MRI and intracranial MRA 12/09/2013.  MRA 05/01/2012.  FINDINGS: CTA HEAD FINDINGS  Stable cerebral volume. Stable gray-white matter differentiation throughout the brain. . Stable hypodensity at the superior right para rolandic cortex likely corresponding to the recent diffusion finding (series 2, image 25). No acute intracranial hemorrhage identified. No ventriculomegaly. No midline shift, mass effect, or evidence of intracranial mass lesion. No abnormal enhancement identified. No acute osseous abnormality identified. Visualized scalp soft tissues are within normal limits.  VASCULAR FINDINGS:  Major intracranial venous structures appear to be enhancing.  The distal left vertebral artery is patent with calcified plaque resulting in mild to moderate stenosis. The left PICA origin is patent. The vertebrobasilar junction is patent.  The reconstituted distal right vertebral artery remains patent intracranially, but is diminutive. The right PICA origin is patent. The right vertebral is diminutive or stenotic be on the origin of PICA.  The basilar artery is patent without stenosis. SCA and right PCA origins are normal. Fetal type left PCA origin. Right posterior communicating artery diminutive or absent. Bilateral PCA branches within normal limits.  The left ICA siphon is patent but is moderately stenosed in the petrous segment (series 6, image 128) by what appears to be soft plaque (see also coronal image 97). This appearance is stable from the recent MRA. Beyond this level, the left ICA siphon is patent without hemodynamically significant stenosis despite extensive calcified plaque. The left ICA terminus is patent. The left ophthalmic and posterior communicating artery origins are within normal limits. Left MCA and ACA origins are normal.  Anterior communicating artery is diminutive or absent. Asymmetrically decreased enhancement of the right ACA, which  remains patent. Normal left ACA.  The right ICA siphon is patent but demonstrates extensive calcified plaque in the cavernous segment. This results in moderate stenosis in the cavernous segment, stable from the recent MRA. Asymmetric decreased distal right ICA enhancement. Ophthalmic artery origin within normal limits. Right ICA terminus remains patent. Right MCA and ACA origins are patent.  Mildly decreased enhancement of the proximal right ACA in conjunction with the decreased right ICA enhancement. Asymmetrically decreased enhancement of the right MCA, but no major MCA branch occlusion identified. The left MCA branches are normal.  Review of the MIP images confirms the above findings.  CTA NECK FINDINGS  Upper lungs demonstrate emphysema and dependent opacity most compatible with atelectasis. Mediastinal lipomatosis. Negative thyroid, larynx, pharynx, parapharyngeal spaces, retropharyngeal space, sublingual space, submandibular glands and parotid glands. Negative orbits soft tissues. Minor paranasal sinus mucosal thickening. No cervical lymphadenopathy. Cervical spine and left TMJ degeneration. Advanced cervical disc and endplate degeneration. At least mild degenerative spinal stenosis at C5-C6. No acute osseous abnormality identified.  VASCULAR FINDINGS:  Visible central pulmonary arteries are patent. Moderate calcified plaque throughout the aortic arch. Great vessel origins are patent.  No right CCA origin stenosis. Soft and calcified plaque throughout the right CCA, resulting an up to 50 % stenosis with respect to the distal vessel at the level of the thyroid. The right carotid remains patent to the bifurcation. Abundant soft and calcified plaque in the right ICA origin and bulb resulting an very high-grade stenosis and a radiographic string sign over a distance of 1 cm. See series 6, images 81- 91. The cervical right ICA remains patent despite this finding but demonstrates  additional soft plaque distally.  Just proximal to the skullbase there is subsequent stenosis of up to 60-65 % with respect to the distal vessel.  Bulky calcified plaque at the right subclavian artery origin resulting in high-grade stenosis (up to 75 % with respect to the distal vessel). Tortuous proximal right vertebral artery which is severely stenotic or occluded along a short segment of the distal right V1 (coronal image 148). The right vertebral artery is reconstituted at the C6 level, but is diminutive and poorly enhancing throughout the neck and to the skullbase. The vessel is again reconstituted from muscular branches just proximal to the skullbase (series 6, image 113).  Calcified plaque at the left CCA origin results in less than 50 % stenosis with respect to the distal vessel. Soft and calcified plaque throughout the left CCA. Left carotid bifurcation remains widely patent despite plaque. At the distal bulb there is bulky plaque resulting in stenosis of up to 60-65 % with respect to the distal vessel. Beyond this level, the cervical left ICA is tortuous but otherwise negative.  No proximal left subclavian artery stenosis despite plaque. Moderate stenosis at the left vertebral artery origin related to bulky calcified plaque (series 6, image 43). However, the left vertebral artery remains patent. There is a high-grade stenosis due to soft plaque at the left C6 transverse foramen level (series 6, image 68). Beyond this, the left vertebral artery is patent without additional stenosis to the skullbase.  Review of the MIP images confirms the above findings.  IMPRESSION: 1. Widespread severe atherosclerosis with multifocal significant arterial stenoses. 2. With regard to the carotids and anterior circulation: - 50% stenosis right CCA. - very high-grade stenosis with radiographic string sign at the right ICA origin over a distance of 1 cm. The right ICA remains patent despite this finding, but is hypo enhancing compared to the left side. - left  ICA distal bulb stenosis up to 60 -65%. - extensive bilateral ICA siphon calcified plaque, resulting in bilateral siphon moderate stenosis. - hypoenhancing right ICA terminus, right ACA and right MCA, but no major intracranial branch occlusion. 3. With regard to the vertebral arteries and posterior circulation: - 75% right subclavian artery origin stenosis. 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 -reconstituted distal right vertebral artery from muscular branches just below the skullbase. Right PICA is patent, the right vertebral be on the PICA is diminutive/stenotic. - moderate left vertebral artery origin stenosis. Severe stenosis of the proximal left V2 segment related to soft plaque at the C6 level. 4. Stable CT appearance of the brain. Salient findings by telephone with NP SHARON BIBY on 05/23/2013 at 13:26 .   Electronically Signed   By: Lars Pinks M.D.   On: 05/23/2013 13:27   Mr Zachary [name] Head Wo Contrast  05/22/2013   CLINICAL DATA:  Weakness.  Rule out stroke.  EXAM: MRI HEAD WITHOUT CONTRAST  MRA HEAD WITHOUT CONTRAST  TECHNIQUE: Multiplanar, multiecho pulse sequences of the brain and surrounding structures were obtained without intravenous contrast. Angiographic images of the head were obtained using MRA technique without contrast.  COMPARISON:  Head CT 05/20/2013 and brain MRI/ MRA 05/01/2012  FINDINGS: MRI HEAD FINDINGS  Images are mildly to moderately degraded by motion artifact. Incidental note is made of a partially empty sella. There is a small, acute infarct in the right frontoparietal region near the vertex likely involving the precentral gyrus. Remote lacunar infarcts are identified involving the right lentiform nucleus and  right corona radiata. Small, remote cortical infarcts are noted involving the left greater than right parietal lobes. There is moderate cerebral atrophy with enlargement of the ventricles and sylvian fissures and proportionately  less sulcal enlargement near the vertex, unchanged from prior MRI.  Foci of T2 hyperintensity within the periventricular white matter and brainstem do not appear significantly changed and are nonspecific but compatible with mild to moderate chronic small vessel ischemic disease. There is no evidence of intracranial hemorrhage, mass, midline shift, or extra-axial fluid collection. Prior bilateral cataract surgery is noted. Paranasal sinuses and mastoid air cells are clear. Moderate pannus is present at C1-2.  MRA HEAD FINDINGS  Images are mildly to moderately degraded by motion artifact. The visualized distal right vertebral artery appears very small in caliber with diminished flow related enhancement and vessel irregularity, more pronounced than on the prior MRI although there is more motion artifact on the current study. The visualized distal left vertebral artery is patent and dominant without evidence of stenosis. PICA origins are patent bilaterally. SCA origins are patent. Basilar artery is patent without stenosis. There is a fetal origin of the left PCA. There is a mild, focal stenosis of the P1 segment of the right PCA, not evident on the prior study.  Internal carotid arteries are patent from skullbase to carotid termini. The intracranial right ICA is smaller in caliber than the left ICA with less flow related enhancement than on the prior study. The right carotid siphon and proximal supraclinoid ICA appear moderately narrowed. The right A1 segment is mildly irregular without focal stenosis. ACAs are otherwise unremarkable. Diminished flow related enhancement is present in the right MCA near the bifurcation, although the more distal branches appear patent and of relatively normal caliber. Left MCA is unremarkable. No intracranial aneurysm is identified.  IMPRESSION: 1. Small, acute posterior right frontal infarct. 2. Unchanged appearance of cerebral atrophy and chronic small vessel ischemic disease with  remote lacunar infarcts as above. 3. No evidence of proximal intracranial arterial occlusion. There is diminished flow related enhancement and caliber of the distal right vertebral artery, intracranial right ICA, and right MCA near the bifurcation, which could reflect worsening intracranial atherosclerotic disease or be at least partly artifactual.   Electronically Signed   By: Logan Bores   On: 05/22/2013 08:28   Mr Brain Wo Contrast  05/22/2013   CLINICAL DATA:  Weakness.  Rule out stroke.  EXAM: MRI HEAD WITHOUT CONTRAST  MRA HEAD WITHOUT CONTRAST  TECHNIQUE: Multiplanar, multiecho pulse sequences of the brain and surrounding structures were obtained without intravenous contrast. Angiographic images of the head were obtained using MRA technique without contrast.  COMPARISON:  Head CT 05/20/2013 and brain MRI/ MRA 05/01/2012  FINDINGS: MRI HEAD FINDINGS  Images are mildly to moderately degraded by motion artifact. Incidental note is made of a partially empty sella. There is a small, acute infarct in the right frontoparietal region near the vertex likely involving the precentral gyrus. Remote lacunar infarcts are identified involving the right lentiform nucleus and right corona radiata. Small, remote cortical infarcts are noted involving the left greater than right parietal lobes. There is moderate cerebral atrophy with enlargement of the ventricles and sylvian fissures and proportionately less sulcal enlargement near the vertex, unchanged from prior MRI.  Foci of T2 hyperintensity within the periventricular white matter and brainstem do not appear significantly changed and are nonspecific but compatible with mild to moderate chronic small vessel ischemic disease. There is no evidence of intracranial hemorrhage, mass, midline  shift, or extra-axial fluid collection. Prior bilateral cataract surgery is noted. Paranasal sinuses and mastoid air cells are clear. Moderate pannus is present at C1-2.  MRA HEAD FINDINGS   Images are mildly to moderately degraded by motion artifact. The visualized distal right vertebral artery appears very small in caliber with diminished flow related enhancement and vessel irregularity, more pronounced than on the prior MRI although there is more motion artifact on the current study. The visualized distal left vertebral artery is patent and dominant without evidence of stenosis. PICA origins are patent bilaterally. SCA origins are patent. Basilar artery is patent without stenosis. There is a fetal origin of the left PCA. There is a mild, focal stenosis of the P1 segment of the right PCA, not evident on the prior study.  Internal carotid arteries are patent from skullbase to carotid termini. The intracranial right ICA is smaller in caliber than the left ICA with less flow related enhancement than on the prior study. The right carotid siphon and proximal supraclinoid ICA appear moderately narrowed. The right A1 segment is mildly irregular without focal stenosis. ACAs are otherwise unremarkable. Diminished flow related enhancement is present in the right MCA near the bifurcation, although the more distal branches appear patent and of relatively normal caliber. Left MCA is unremarkable. No intracranial aneurysm is identified.  IMPRESSION: 1. Small, acute posterior right frontal infarct. 2. Unchanged appearance of cerebral atrophy and chronic small vessel ischemic disease with remote lacunar infarcts as above. 3. No evidence of proximal intracranial arterial occlusion. There is diminished flow related enhancement and caliber of the distal right vertebral artery, intracranial right ICA, and right MCA near the bifurcation, which could reflect worsening intracranial atherosclerotic disease or be at least partly artifactual.   Electronically Signed   By: Logan Bores   On: 05/22/2013 08:28   Anti-infectives: Anti-infectives   None      Assessment/Plan: s/p Procedure(s): ENDARTERECTOMY CAROTID  (Right) Again discussed with the patient and also with his wife present. I discussed this with Dr.Sethi as well. All are in agreement for right carotid endarterectomy. The patient has a critical stenosis with the string sign and also has MRI proven stroke probably related to embolus from the bifurcation. Since he has not had a major new deficit we'll plan on surgery for Monday, February 9. As the procedure including 1-1/2% risk for stroke with surgery. Also explained expected recovery from this.   LOS: 5 days   Tahnee Cifuentes 05/25/2013, 10:37 AM

## 2013-05-25 NOTE — Progress Notes (Signed)
Physical Therapy Treatment Patient Details Name: Zachary [name] MRN: [id number] DOB: August 04, 1921 Today's Date: 05/25/2013 Time: 6160-7371 PT Time Calculation (min): 34 min  PT Assessment / Plan / Recommendation  History of Present Illness 80 y.o. male admitted to [facility] on 05/20/13 with 1 month h/o progressive weakness.  Patient endorses diffuse aches all over and severe constipation. No BM for 3 days. Patient was brought to ER In ER he finally had a BM and also was noted to be retaining urine. On the monitor patient noted to be in bigemini. Denies any chest pain, fever, no shortness of breath. States he has spoken to his PCP regarding his generalized fatigue and was told that this is age related.   PT Comments   Pt is progressing well today with his mobility.  He was able to increase his gait distance, but is still weak and requires assist for balance and safety.  He continues to be appropriate for SNF level therapies at discharge.    Follow Up Recommendations  SNF     Does the patient have the potential to tolerate intense rehabilitation    NA  Barriers to Discharge   None      Equipment Recommendations  None recommended by PT    Recommendations for Other Services   None  Frequency Min 2X/week   Progress towards PT Goals Progress towards PT goals: Progressing toward goals  Plan Current plan remains appropriate;Frequency needs to be updated    Precautions / Restrictions Precautions Precautions: Fall Precaution Comments: pt is generally weak and deconditioned.    Pertinent Vitals/Pain See vitals flow sheet.    Mobility  Bed Mobility Overal bed mobility: Needs Assistance Bed Mobility: Supine to Sit Supine to sit: Min assist General bed mobility comments: Min assist to support trunk during transitions.  Pt using bed rail for support HOB flat.  Transfers Overall transfer level: Needs assistance Equipment used: Rolling walker (2 wheeled) Transfers: Sit to/from Stand Sit to Stand:  Min assist General transfer comment: Min assist to support trunk for balance and help control descent during transitions.  Verbal cues for safest hand placement.  Ambulation/Gait Ambulation/Gait assistance: Min assist Ambulation Distance (Feet): 200 Feet Assistive device: Rolling walker (2 wheeled) Gait Pattern/deviations: Step-through pattern;Shuffle;Trunk flexed (bil knees flexed in stance) Gait velocity: decreased Gait velocity interpretation: <1.8 ft/sec, indicative of risk for recurrent falls General Gait Details: Min assist needed for balance while turning and assist with steering RW during gait.  Max verbal cues for upright posture and safe use of RW during gait.     Exercises General Exercises - Upper Extremity Shoulder Flexion: AROM;Both;10 reps;Seated Elbow Flexion: AROM;Both;10 reps;Seated General Exercises - Lower Extremity Long Arc Quad: AROM;Both;10 reps;Seated Hip Flexion/Marching: AROM;Both;10 reps;Seated Toe Raises: AROM;Both;10 reps;Seated Heel Raises: AROM;Both;10 reps;Seated     PT Goals (current goals can now be found in the care plan section) Acute Rehab PT Goals Patient Stated Goal: go to rehab then go home  Visit Information  Last PT Received On: 05/25/13 Assistance Needed: +1 History of Present Illness: 80 y.o. male admitted to [facility] on 05/20/13 with 1 month h/o progressive weakness.  Patient endorses diffuse aches all over and severe constipation. No BM for 3 days. Patient was brought to ER In ER he finally had a BM and also was noted to be retaining urine. On the monitor patient noted to be in bigemini. Denies any chest pain, fever, no shortness of breath. States he has spoken to his PCP regarding his generalized fatigue  and was told that this is age related.    Subjective Data  Subjective: Pt reports he has not been up in the chair yet today Patient Stated Goal: go to rehab then go home   Cognition  Cognition Arousal/Alertness: Awake/alert Behavior During  Therapy: WFL for tasks assessed/performed Overall Cognitive Status: Within Functional Limits for tasks assessed    Balance  Balance Overall balance assessment: Needs assistance Sitting-balance support: Bilateral upper extremity supported;Feet supported;Feet unsupported Sitting balance-Leahy Scale: Poor Sitting balance - Comments: Poor until he gets his feet on the floor then he can be supervision-fair Standing balance support: Single extremity supported;Bilateral upper extremity supported;No upper extremity supported Standing balance-Leahy Scale: Poor Standing balance comment: while preforming tasks at the sink (washing hands after toileting) pt needed min guard up to min assist when he removed both his hands from the sink at one point, staggered to the right.    End of Session PT - End of Session Equipment Utilized During Treatment: Gait belt Activity Tolerance: Patient limited by fatigue Patient left: in chair;with call bell/phone within reach    Sargeant B. Bailey, [place], DPT [phone]   05/25/2013, 4:26 PM

## 2013-05-26 LAB — GLUCOSE, CAPILLARY
GLUCOSE-CAPILLARY: 111 mg/dL — AB (ref 70–99)
Glucose-Capillary: 115 mg/dL — ABNORMAL HIGH (ref 70–99)
Glucose-Capillary: 101 mg/dL — ABNORMAL HIGH (ref 70–99)
Glucose-Capillary: 138 mg/dL — ABNORMAL HIGH (ref 70–99)

## 2013-05-26 NOTE — Progress Notes (Signed)
   Daily Progress Note  Assessment/Planning: R CVA   Neuro intact  Pt's questions answered  Scheduled for R CEA on Monday  Subjective   No complaints  Objective Filed Vitals:   05/25/13 2000 05/26/13 0000 05/26/13 0400 05/26/13 0733  BP: 159/93 145/72 183/88 172/80  Pulse: 68 71 66 65  Temp: 98.1 F (36.7 C) 98.3 F (36.8 C) 98.1 F (36.7 C) 97.3 F (36.3 C)  TempSrc: Oral Oral Oral Oral  Resp: 18 18 18 16   Height:      Weight:      SpO2: 95% 94% 95% 93%    Intake/Output Summary (Last 24 hours) at 05/26/13 0925 Last data filed at 05/26/13 0900  Gross per 24 hour  Intake    360 ml  Output    200 ml  Net    160 ml    PULM  CTAB CV  RRR GI  soft, NTND NEURO CN 2-12 intact, MS 5/5  Laboratory CBC    Component Value Date/Time   WBC 21.4* 05/22/2013 0245   HGB 15.3 05/22/2013 0245   HCT 43.7 05/22/2013 0245   PLT 255 05/22/2013 0245    BMET    Component Value Date/Time   NA 142 05/22/2013 0245   K 3.8 05/22/2013 0245   CL 102 05/22/2013 0245   CO2 22 05/22/2013 0245   GLUCOSE 119* 05/22/2013 0245   BUN 13 05/22/2013 0245   CREATININE 0.96 05/22/2013 0245   CALCIUM 8.8 05/22/2013 0245   GFRNONAA 71* 05/22/2013 0245   GFRAA 83* 05/22/2013 0245    Adele Barthel, MD [facility] Office: [phone] Pager: [phone]  05/26/2013, 9:25 AM

## 2013-05-26 NOTE — Progress Notes (Signed)
Stroke Team Progress Note  HISTORY Zachary [name] is an 80 y.o. male who reports that he has had a slowly progressive weakness that has occurred over the past 2 weeks. It is now to the point that he is no longer going up stairs because he feels unsafe. He feels unsafe to drive as well. Has extensive difficulty getting up from the toilet. Has generalized achiness. Reports tingling in his fingertips that has been present for about a week. Also reports that he has been constipated for about a week which is unusual for him. He has not had any bladder issues. When he walks feels as if his legs are bucking on him and he feels off balance. He does not describe vertigo. Patient was not administerd TPA secondary to delay in arrival. He was admitted for further evaluation and treatment.  SUBJECTIVE D/W patient and Dr Early plan to do Rt CEA on Monday prior to SNF rehab  OBJECTIVE Most recent Vital Signs: Filed Vitals:   05/25/13 2000 05/26/13 0000 05/26/13 0400 05/26/13 0733  BP: 159/93 145/72 183/88 172/80  Pulse: 68 71 66 65  Temp: 98.1 F (36.7 C) 98.3 F (36.8 C) 98.1 F (36.7 C) 97.3 F (36.3 C)  TempSrc: Oral Oral Oral Oral  Resp: 18 18 18 16   Height:      Weight:      SpO2: 95% 94% 95% 93%   CBG (last 3)   Recent Labs  05/25/13 1632 05/25/13 2034 05/26/13 0746  GLUCAP 149* 119* 115*    IV Fluid Intake:     MEDICATIONS  . aspirin  300 mg Rectal Daily   Or  . aspirin  325 mg Oral Daily  . docusate sodium  100 mg Oral BID  . insulin aspart  0-9 Units Subcutaneous TID WC & HS  . metoprolol tartrate  12.5 mg Oral BID  . pantoprazole  40 mg Oral Daily  . pentoxifylline  400 mg Oral TID WC  . senna-docusate  1 tablet Oral QHS  . simvastatin  20 mg Oral q1800  . tamsulosin  0.4 mg Oral Daily   PRN:  acetaminophen, acetaminophen, guaifenesin, labetalol, polyethylene glycol, temazepam  Diet:  Carb Control thin liquids Activity:  OOB with assistance DVT Prophylaxis:  scds  ordered  CLINICALLY SIGNIFICANT STUDIES Basic Metabolic Panel:   Recent Labs Lab 05/20/13 1734 05/20/13 1850 05/22/13 0245  NA 139  --  142  K 4.1  --  3.8  CL 99  --  102  CO2 23  --  22  GLUCOSE 107*  --  119*  BUN 16  --  13  CREATININE 0.97  --  0.96  CALCIUM 9.1  --  8.8  MG  --  1.9  --    Liver Function Tests:   Recent Labs Lab 05/20/13 1850  AST 31  ALT 15  ALKPHOS 54  BILITOT 0.6  PROT 7.2  ALBUMIN 3.7   CBC:  Recent Labs Lab 05/20/13 0047  05/21/13 0323 05/22/13 0245  WBC  --   < > 18.3* 21.4*  NEUTROABS 13.5*  --  13.1*  --   HGB  --   < > 14.9 15.3  HCT  --   < > 43.2 43.7  MCV  --   < > 91.9 91.8  PLT  --   < > 238 255  < > = values in this interval not displayed. Coagulation:   Recent Labs Lab 05/20/13 0047  LABPROT 12.4  INR 0.94   Cardiac Enzymes:   Recent Labs Lab 05/20/13 0047 05/21/13 0323 05/21/13 0620 05/21/13 1210  CKTOTAL 621*  --   --   --   TROPONINI  --  <0.30 <0.30 <0.30   Urinalysis:   Recent Labs Lab 05/20/13 2040  COLORURINE YELLOW  LABSPEC 1.013  PHURINE 6.0  GLUCOSEU NEGATIVE  HGBUR NEGATIVE  BILIRUBINUR NEGATIVE  KETONESUR 15*  PROTEINUR NEGATIVE  UROBILINOGEN 0.2  NITRITE NEGATIVE  LEUKOCYTESUR NEGATIVE   Lipid Panel    Component Value Date/Time   CHOL 186 05/21/2013 0323   TRIG 128 05/21/2013 0323   HDL 32* 05/21/2013 0323   CHOLHDL 5.8 05/21/2013 0323   VLDL 26 05/21/2013 0323   LDLCALC 128* 05/21/2013 0323   HgbA1C  Lab Results  Component Value Date   HGBA1C 6.8* 05/21/2013    Urine Drug Screen:   No results found for this basename: labopia,  cocainscrnur,  labbenz,  amphetmu,  thcu,  labbarb    Alcohol Level: No results found for this basename: ETH,  in the last 168 hours   CT of the brain  05/20/2013 No acute intracranial process.  Mild sulcal effacement of the convexities, disproportionate to the degree of sulcal enlargement can be seen with normal pressure hydrocephalus.  Moderate to severe  white matter changes suggest chronic small vessel ischemic disease with remote right basal ganglia lacunar infarct, similar.   MRI of the brain  05/22/2013   1. Small, acute posterior right frontal infarct. 2. Unchanged appearance of cerebral atrophy and chronic small vessel ischemic disease with remote lacunar infarcts   MRA of the brain  05/22/2013    No evidence of proximal intracranial arterial occlusion. There is diminished flow related enhancement and caliber of the distal right vertebral artery, intracranial right ICA, and right MCA near the bifurcation, which could reflect worsening intracranial atherosclerotic disease or be at least partly artifactual.     2D Echocardiogram  EF 55-60% with no source of embolus.   Carotid Doppler  Right: Greater than 80% internal carotid artery stenosis. Unable to evaluate the vertebral due to movement. Left : 40% to 59 % ICA stenosis upper end of scale by systolic velocities. Increase may possible be due in part to compensatory flow from the stenosis on the right. Vertebral artery flow could not be evaluated due to movement and snoring  CXR  05/20/2013 No acute disease.  EKG  sinus tachycardia. For complete results please see formal report.   Therapy Recommendations SNF  Physical Exam   Elderly Caucasian male not in distress.Awake alert. Afebrile. Head is nontraumatic. Neck is supple with soft bilateral carotid bruits. Hearing is diminished bilaterallyl. Cardiac exam no murmur or gallop. Lungs are clear to auscultation. Distal pulses are well felt. Neurological Exam ;  Awake  Alert oriented x 3. Diminished attention, registration and recall. Normal speech and language.eye movements full without nystagmus.fundi were not visualized. Vision acuity and fields appear normal. Hearing is diminished bilaterally. Palatal movements are normal. Face symmetric. Tongue midline. Normal strength, tone, reflexes and coordination. Normal sensation. Gait  deferred.  ASSESSMENT Zachary [name] is a 80 y.o. male presenting with slowly progressive weakness. Imaging confirms a small right posterior frontal infarct with the setting of R ICA stenosis. Infarct felt to be due to large vessel disease. On aspirin 81 mg orally every day and trental prior to admission. Now on aspirin 325 mg orally every day for secondary stroke prevention. Given patients functional status, feel he would  be a good candidate for revascularization.   Diabetes, HgbA1c 6.8, goal < 7.0 Hyperlipidemia, LDL 128, on no statin PTA, now on no statin, goal LDL < 100 (< 70 for diabetics) Leukocytosis, wbc   PVD  Prostate cancer  Family hx stroke (father)  Bigeminy & Trigeminy  Bilateral ICA stenosis (R >80%, L 40-59%)  Hospital day # 6  TREATMENT/PLAN  Continue aspirin 325 mg orally every day for secondary stroke prevention.  zocor  Pt currently has not complaints and thinks he is at baseline  Scheduled for R CEA on Monday am due to string sign  NPO after MN tomorrow.    Antony Contras, MD

## 2013-05-26 NOTE — Progress Notes (Signed)
Subjective: No  New problems overnight  Objective: Vital signs in last 24 hours: Temp:  [97.3 F (36.3 C)-98.3 F (36.8 C)] 97.3 F (36.3 C) (02/07 0733) Pulse Rate:  [65-75] 69 (02/07 1011) Resp:  [16-18] 16 (02/07 0733) BP: (145-184)/(72-93) 184/74 mmHg (02/07 1011) SpO2:  [92 %-95 %] 93 % (02/07 0733) Weight change:  Last BM Date: 05/25/13  Intake/Output from previous day: 02/06 0701 - 02/07 0700 In: -  Out: 200 [Urine:200] Intake/Output this shift: Total I/O In: 360 [P.O.:360] Out: -   Resp: clear to auscultation bilaterally Cardio: regular rate and rhythm Neurologic: Motor: grossly moves all ext  Lab Results:  Results for orders placed during the hospital encounter of 05/20/13 (from the past 24 hour(s))  GLUCOSE, CAPILLARY     Status: Abnormal   Collection Time    05/25/13 11:34 AM      Result Value Range   Glucose-Capillary 125 (*) 70 - 99 mg/dL  GLUCOSE, CAPILLARY     Status: Abnormal   Collection Time    05/25/13  4:32 PM      Result Value Range   Glucose-Capillary 149 (*) 70 - 99 mg/dL  GLUCOSE, CAPILLARY     Status: Abnormal   Collection Time    05/25/13  8:34 PM      Result Value Range   Glucose-Capillary 119 (*) 70 - 99 mg/dL   Comment 1 Notify RN    SURGICAL PCR SCREEN     Status: None   Collection Time    05/25/13 10:04 PM      Result Value Range   MRSA, PCR NEGATIVE  NEGATIVE   Staphylococcus aureus NEGATIVE  NEGATIVE  GLUCOSE, CAPILLARY     Status: Abnormal   Collection Time    05/26/13  7:46 AM      Result Value Range   Glucose-Capillary 115 (*) 70 - 99 mg/dL   Comment 1 Notify RN        Studies/Results:  CT of the brain 05/20/2013 No acute intracranial process. Mild sulcal effacement of the convexities, disproportionate to the degree of sulcal enlargement can be seen with normal pressure hydrocephalus. Moderate to severe white matter changes suggest chronic small vessel ischemic disease with remote right basal ganglia lacunar infarct,  similar.  MRI of the brain 05/22/2013 1. Small, acute posterior right frontal infarct. 2. Unchanged appearance of cerebral atrophy and chronic small vessel ischemic disease with remote lacunar infarcts  MRA of the brain 05/22/2013 No evidence of proximal intracranial arterial occlusion. There is diminished flow related enhancement and caliber of the distal right vertebral artery, intracranial right ICA, and right MCA near the bifurcation, which could reflect worsening intracranial atherosclerotic disease or be at least partly artifactual.  2D Echocardiogram EF 55-60% with no source of embolus.  Carotid Doppler Right: Greater than 80% internal carotid artery stenosis. Unable to evaluate the vertebral due to movement. Left : 40% to 59 % ICA stenosis upper end of scale by systolic velocities. Increase may possible be due in part to compensatory flow from the stenosis on the right. Vertebral artery flow could not be evaluated due to movement and snoring     Medications:  Prior to Admission:  Prescriptions prior to admission  Medication Sig Dispense Refill  . ergocalciferol (VITAMIN D2) 50000 UNITS capsule Take 50,000 Units by mouth once a week. Friday      . flurazepam (DALMANE) 30 MG capsule Take 30 mg by mouth at bedtime as needed. sleep      .  glimepiride (AMARYL) 1 MG tablet Take 1 mg by mouth daily before breakfast.      . Multiple Vitamin (MULTIVITAMIN WITH MINERALS) TABS Take 1 tablet by mouth daily.      Marland Kitchen omeprazole (PRILOSEC) 20 MG capsule Take 20 mg by mouth daily.      . pentoxifylline (TRENTAL) 400 MG CR tablet TAKE 1 TABLET BY MOUTH THREE TIMES A DAY WITH MEALS  90 tablet  11  . Tamsulosin HCl (FLOMAX) 0.4 MG CAPS Take 0.4 mg by mouth daily.       . [DISCONTINUED] aspirin EC 81 MG tablet Take 81 mg by mouth daily.       Scheduled: . aspirin  300 mg Rectal Daily   Or  . aspirin  325 mg Oral Daily  . docusate sodium  100 mg Oral BID  . insulin aspart  0-9 Units Subcutaneous TID WC & HS   . metoprolol tartrate  12.5 mg Oral BID  . pantoprazole  40 mg Oral Daily  . pentoxifylline  400 mg Oral TID WC  . senna-docusate  1 tablet Oral QHS  . simvastatin  20 mg Oral q1800  . tamsulosin  0.4 mg Oral Daily   Continuous:  IWL:[id number], acetaminophen, guaifenesin, labetalol, polyethylene glycol, temazepam  Assessment/Plan: Cva, right frontal in setting of critical stenosis on right, plans for R CEA Monday  Active Problems:   Hypertension   Pulsus bigeminis   Dehydration   Diabetes mellitus   Acute ischemic stroke    LOS: 6 days   Jadea Shiffer D 05/26/2013, 10:55 AM

## 2013-05-27 LAB — GLUCOSE, CAPILLARY
GLUCOSE-CAPILLARY: 116 mg/dL — AB (ref 70–99)
GLUCOSE-CAPILLARY: 137 mg/dL — AB (ref 70–99)
Glucose-Capillary: 121 mg/dL — ABNORMAL HIGH (ref 70–99)
Glucose-Capillary: 182 mg/dL — ABNORMAL HIGH (ref 70–99)

## 2013-05-27 NOTE — Progress Notes (Signed)
Stroke Team Progress Note  HISTORY Zachary [name] is an 80 y.o. male who reports that he has had a slowly progressive weakness that has occurred over the past 2 weeks. It is now to the point that he is no longer going up stairs because he feels unsafe. He feels unsafe to drive as well. Has extensive difficulty getting up from the toilet. Has generalized achiness. Reports tingling in his fingertips that has been present for about a week. Also reports that he has been constipated for about a week which is unusual for him. He has not had any bladder issues. When he walks feels as if his legs are bucking on him and he feels off balance. He does not describe vertigo. Patient was not administerd TPA secondary to delay in arrival. He was admitted for further evaluation and treatment.  SUBJECTIVE No complaints this AM R CEA tomorrow.   OBJECTIVE Most recent Vital Signs: Filed Vitals:   05/26/13 2000 05/27/13 0000 05/27/13 0400 05/27/13 0732  BP: 155/82 152/47 135/51 140/61  Pulse: 76 73 65 72  Temp: 98.4 F (36.9 C) 98.6 F (37 C) 98.8 F (37.1 C) 98.4 F (36.9 C)  TempSrc: Oral Oral Oral   Resp: 16 15 16 16   Height:      Weight:      SpO2: 96% 95% 95% 94%   CBG (last 3)   Recent Labs  05/26/13 1627 05/26/13 2000 05/27/13 0810  GLUCAP 138* 111* 116*    IV Fluid Intake:     MEDICATIONS  . aspirin  300 mg Rectal Daily   Or  . aspirin  325 mg Oral Daily  . docusate sodium  100 mg Oral BID  . insulin aspart  0-9 Units Subcutaneous TID WC & HS  . metoprolol tartrate  12.5 mg Oral BID  . pantoprazole  40 mg Oral Daily  . pentoxifylline  400 mg Oral TID WC  . senna-docusate  1 tablet Oral QHS  . simvastatin  20 mg Oral q1800  . tamsulosin  0.4 mg Oral Daily   PRN:  acetaminophen, acetaminophen, guaifenesin, labetalol, polyethylene glycol, temazepam  Diet:  Carb Control thin liquids Activity:  OOB with assistance DVT Prophylaxis:  scds ordered  CLINICALLY SIGNIFICANT  STUDIES Basic Metabolic Panel:   Recent Labs Lab 05/20/13 1734 05/20/13 1850 05/22/13 0245  NA 139  --  142  K 4.1  --  3.8  CL 99  --  102  CO2 23  --  22  GLUCOSE 107*  --  119*  BUN 16  --  13  CREATININE 0.97  --  0.96  CALCIUM 9.1  --  8.8  MG  --  1.9  --    Liver Function Tests:   Recent Labs Lab 05/20/13 1850  AST 31  ALT 15  ALKPHOS 54  BILITOT 0.6  PROT 7.2  ALBUMIN 3.7   CBC:   Recent Labs Lab 05/21/13 0323 05/22/13 0245  WBC 18.3* 21.4*  NEUTROABS 13.1*  --   HGB 14.9 15.3  HCT 43.2 43.7  MCV 91.9 91.8  PLT 238 255   Coagulation:  No results found for this basename: LABPROT, INR,  in the last 168 hours Cardiac Enzymes:   Recent Labs Lab 05/21/13 0323 05/21/13 0620 05/21/13 1210  TROPONINI <0.30 <0.30 <0.30   Urinalysis:   Recent Labs Lab 05/20/13 [place] 1.013  PHURINE 6.0  Ryan NEGATIVE  BILIRUBINUR NEGATIVE  KETONESUR  15*  PROTEINUR NEGATIVE  UROBILINOGEN 0.2  NITRITE NEGATIVE  LEUKOCYTESUR NEGATIVE   Lipid Panel    Component Value Date/Time   CHOL 186 05/21/2013 0323   TRIG 128 05/21/2013 0323   HDL 32* 05/21/2013 0323   CHOLHDL 5.8 05/21/2013 0323   VLDL 26 05/21/2013 0323   LDLCALC 128* 05/21/2013 0323   HgbA1C  Lab Results  Component Value Date   HGBA1C 6.8* 05/21/2013    Urine Drug Screen:   No results found for this basename: labopia,  cocainscrnur,  labbenz,  amphetmu,  thcu,  labbarb    Alcohol Level: No results found for this basename: ETH,  in the last 168 hours   CT of the brain  05/20/2013 No acute intracranial process.  Mild sulcal effacement of the convexities, disproportionate to the degree of sulcal enlargement can be seen with normal pressure hydrocephalus.  Moderate to severe white matter changes suggest chronic small vessel ischemic disease with remote right basal ganglia lacunar infarct, similar.   MRI of the brain  05/22/2013   1. Small, acute posterior right  frontal infarct. 2. Unchanged appearance of cerebral atrophy and chronic small vessel ischemic disease with remote lacunar infarcts   MRA of the brain  05/22/2013    No evidence of proximal intracranial arterial occlusion. There is diminished flow related enhancement and caliber of the distal right vertebral artery, intracranial right ICA, and right MCA near the bifurcation, which could reflect worsening intracranial atherosclerotic disease or be at least partly artifactual.     2D Echocardiogram  EF 55-60% with no source of embolus.   Carotid Doppler  Right: Greater than 80% internal carotid artery stenosis. Unable to evaluate the vertebral due to movement. Left : 40% to 59 % ICA stenosis upper end of scale by systolic velocities. Increase may possible be due in part to compensatory flow from the stenosis on the right. Vertebral artery flow could not be evaluated due to movement and snoring  CXR  05/20/2013 No acute disease.  EKG  sinus tachycardia. For complete results please see formal report.   Therapy Recommendations SNF  Physical Exam   Elderly Caucasian male not in distress.Awake alert. Afebrile. Head is nontraumatic. Neck is supple with soft bilateral carotid bruits. Hearing is diminished bilaterallyl. Cardiac exam no murmur or gallop. Lungs are clear to auscultation. Distal pulses are well felt. Neurological Exam ;  Awake  Alert oriented x 3. Diminished attention, registration and recall. Normal speech and language.eye movements full without nystagmus.fundi were not visualized. Vision acuity and fields appear normal. Hearing is diminished bilaterally. Palatal movements are normal. Face symmetric. Tongue midline. Normal strength, tone, reflexes and coordination. Normal sensation. Gait deferred.  ASSESSMENT Zachary [name] is a 80 y.o. male presenting with slowly progressive weakness. Imaging confirms a small right posterior frontal infarct with the setting of R ICA stenosis. Infarct  felt to be due to large vessel disease. On aspirin 81 mg orally every day and trental prior to admission. Now on aspirin 325 mg orally every day for secondary stroke prevention. Given patients functional status, feel he would be a good candidate for revascularization.   Diabetes, HgbA1c 6.8, goal < 7.0 Hyperlipidemia, LDL 128, on no statin PTA, now on no statin, goal LDL < 100 (< 70 for diabetics) Leukocytosis, wbc   PVD  Prostate cancer  Family hx stroke (father)  Bigeminy & Trigeminy  Bilateral ICA stenosis (R >80%, L 40-59%)  Hospital day # 7  TREATMENT/PLAN  Continue aspirin 325  mg orally every day for secondary stroke prevention.  zocor  Pt currently has not complaints able to ambulate with a walker with minimal assistance  S/p BM today  Scheduled for R CEA on Monday am due to string sign  NPO after MN tonight.    Antony Contras, MD

## 2013-05-27 NOTE — Progress Notes (Signed)
Subjective: No new problems overnight, no slurred speech no focal motor weakness.  Objective: Vital signs in last 24 hours: Temp:  [97.5 F (36.4 C)-98.8 F (37.1 C)] 98.4 F (36.9 C) (02/08 0732) Pulse Rate:  [61-76] 72 (02/08 0732) Resp:  [15-18] 16 (02/08 0732) BP: (135-184)/(47-93) 140/61 mmHg (02/08 0732) SpO2:  [93 %-96 %] 94 % (02/08 0732) Weight change:  Last BM Date: 05/26/13  Intake/Output from previous day: 02/07 0701 - 02/08 0700 In: 960 [P.O.:960] Out: 601 [Urine:600; Stool:1] Intake/Output this shift: Total I/O In: 240 [P.O.:240] Out: 300 [Urine:300]  Resp: clear to auscultation bilaterally Cardio: Regular with frequent ectopic beat Extremities: extremities normal, atraumatic, no cyanosis or edema Neurologic: Motor: Grossly moves all extremities  Lab Results:  Results for orders placed during the hospital encounter of 05/20/13 (from the past 24 hour(s))  GLUCOSE, CAPILLARY     Status: Abnormal   Collection Time    05/26/13 11:57 AM      Result Value Range   Glucose-Capillary 101 (*) 70 - 99 mg/dL   Comment 1 Notify RN    GLUCOSE, CAPILLARY     Status: Abnormal   Collection Time    05/26/13  4:27 PM      Result Value Range   Glucose-Capillary 138 (*) 70 - 99 mg/dL   Comment 1 Notify RN    GLUCOSE, CAPILLARY     Status: Abnormal   Collection Time    05/26/13  8:00 PM      Result Value Range   Glucose-Capillary 111 (*) 70 - 99 mg/dL  GLUCOSE, CAPILLARY     Status: Abnormal   Collection Time    05/27/13  8:10 AM      Result Value Range   Glucose-Capillary 116 (*) 70 - 99 mg/dL   Comment 1 Notify RN        Studies/Results: No results found.  Medications:  Prior to Admission:  Prescriptions prior to admission  Medication Sig Dispense Refill  . ergocalciferol (VITAMIN D2) 50000 UNITS capsule Take 50,000 Units by mouth once a week. Friday      . flurazepam (DALMANE) 30 MG capsule Take 30 mg by mouth at bedtime as needed. sleep      .  glimepiride (AMARYL) 1 MG tablet Take 1 mg by mouth daily before breakfast.      . Multiple Vitamin (MULTIVITAMIN WITH MINERALS) TABS Take 1 tablet by mouth daily.      Marland Kitchen omeprazole (PRILOSEC) 20 MG capsule Take 20 mg by mouth daily.      . pentoxifylline (TRENTAL) 400 MG CR tablet TAKE 1 TABLET BY MOUTH THREE TIMES A DAY WITH MEALS  90 tablet  11  . Tamsulosin HCl (FLOMAX) 0.4 MG CAPS Take 0.4 mg by mouth daily.       . [DISCONTINUED] aspirin EC 81 MG tablet Take 81 mg by mouth daily.       Scheduled: . aspirin  300 mg Rectal Daily   Or  . aspirin  325 mg Oral Daily  . docusate sodium  100 mg Oral BID  . insulin aspart  0-9 Units Subcutaneous TID WC & HS  . metoprolol tartrate  12.5 mg Oral BID  . pantoprazole  40 mg Oral Daily  . pentoxifylline  400 mg Oral TID WC  . senna-docusate  1 tablet Oral QHS  . simvastatin  20 mg Oral q1800  . tamsulosin  0.4 mg Oral Daily   Continuous:  DGL:[id number], acetaminophen, guaifenesin, labetalol, polyethylene glycol, temazepam  Assessment/Plan: Cva, right frontal in setting of critical stenosis on right, plans for R CEA Monday  Active Problems:  Hypertension  Pulsus bigeminis  Diabetes mellitus      LOS: 7 days   Zachary [name] D 05/27/2013, 9:35 AM

## 2013-05-28 ENCOUNTER — Encounter (HOSPITAL_COMMUNITY): Payer: Self-pay | Admitting: Critical Care Medicine

## 2013-05-28 ENCOUNTER — Inpatient Hospital Stay (HOSPITAL_COMMUNITY): Payer: Medicare Other | Admitting: Anesthesiology

## 2013-05-28 ENCOUNTER — Encounter (HOSPITAL_COMMUNITY): Payer: Medicare Other | Admitting: Anesthesiology

## 2013-05-28 ENCOUNTER — Encounter (HOSPITAL_COMMUNITY)
Admission: EM | Disposition: A | Discharge status: Skilled Nursing Facility | Payer: Self-pay | Source: Home / Self Care | Attending: Internal Medicine

## 2013-05-28 DIAGNOSIS — I739 Peripheral vascular disease, unspecified: Secondary | ICD-10-CM

## 2013-05-28 DIAGNOSIS — I6529 Occlusion and stenosis of unspecified carotid artery: Secondary | ICD-10-CM | POA: Diagnosis not present

## 2013-05-28 DIAGNOSIS — Z9889 Other specified postprocedural states: Secondary | ICD-10-CM | POA: Diagnosis present

## 2013-05-28 DIAGNOSIS — I779 Disorder of arteries and arterioles, unspecified: Secondary | ICD-10-CM | POA: Diagnosis not present

## 2013-05-28 HISTORY — PX: ENDARTERECTOMY: SHX5162

## 2013-05-28 LAB — GLUCOSE, CAPILLARY
GLUCOSE-CAPILLARY: 110 mg/dL — AB (ref 70–99)
GLUCOSE-CAPILLARY: 150 mg/dL — AB (ref 70–99)
GLUCOSE-CAPILLARY: 138 mg/dL — AB (ref 70–99)
Glucose-Capillary: 184 mg/dL — ABNORMAL HIGH (ref 70–99)

## 2013-05-28 LAB — CBC
HEMATOCRIT: 42.7 % (ref 39.0–52.0)
HEMOGLOBIN: 15.3 g/dL (ref 13.0–17.0)
MCH: 33 pg (ref 26.0–34.0)
MCHC: 35.8 g/dL (ref 30.0–36.0)
MCV: 92 fL (ref 78.0–100.0)
Platelets: 301 10*3/uL (ref 150–400)
RBC: 4.64 MIL/uL (ref 4.22–5.81)
RDW: 13.1 % (ref 11.5–15.5)
WBC: 15.8 10*3/uL — ABNORMAL HIGH (ref 4.0–10.5)

## 2013-05-28 LAB — BASIC METABOLIC PANEL
BUN: 17 mg/dL (ref 6–23)
CHLORIDE: 105 meq/L (ref 96–112)
CO2: 27 mEq/L (ref 19–32)
Calcium: 9 mg/dL (ref 8.4–10.5)
Creatinine, Ser: 1.06 mg/dL (ref 0.50–1.35)
GFR calc non Af Amer: 60 mL/min — ABNORMAL LOW (ref >90)
GFR, EST AFRICAN AMERICAN: 70 mL/min — AB (ref >90)
Glucose, Bld: 127 mg/dL — ABNORMAL HIGH (ref 70–99)
Potassium: 4.3 mEq/L (ref 3.7–5.3)
Sodium: 144 mEq/L (ref 137–147)

## 2013-05-28 SURGERY — ENDARTERECTOMY, CAROTID
Anesthesia: General | Site: Neck | Laterality: Right

## 2013-05-28 MED ORDER — HEPARIN SODIUM (PORCINE) 1000 UNIT/ML IJ SOLN
INTRAMUSCULAR | Status: DC | PRN
  Administered 2013-05-28: 9000 [IU] via INTRAVENOUS

## 2013-05-28 MED ORDER — MAGNESIUM SULFATE 40 MG/ML IJ SOLN
2.0000 g | Freq: Every day | INTRAMUSCULAR | Status: DC | PRN
  Filled 2013-05-28: qty 50

## 2013-05-28 MED ORDER — GLYCOPYRROLATE 0.2 MG/ML IJ SOLN
INTRAMUSCULAR | Status: AC
  Filled 2013-05-28: qty 1

## 2013-05-28 MED ORDER — ONDANSETRON HCL 4 MG/2ML IJ SOLN: 4.0000 mg | Freq: Once | INTRAMUSCULAR | Status: DC | PRN

## 2013-05-28 MED ORDER — OXYCODONE-ACETAMINOPHEN 5-325 MG PO TABS: 1.0000 | ORAL_TABLET | ORAL | Status: DC | PRN

## 2013-05-28 MED ORDER — LIDOCAINE HCL (CARDIAC) 20 MG/ML IV SOLN
INTRAVENOUS | Status: AC
  Filled 2013-05-28: qty 5

## 2013-05-28 MED ORDER — SODIUM CHLORIDE 0.9 % IR SOLN
Status: DC | PRN
  Administered 2013-05-28: 10:00:00

## 2013-05-28 MED ORDER — SODIUM CHLORIDE 0.9 % IV SOLN: 500.0000 mL | Freq: Once | INTRAVENOUS | Status: AC | PRN

## 2013-05-28 MED ORDER — MORPHINE SULFATE 2 MG/ML IJ SOLN
2.0000 mg | INTRAMUSCULAR | Status: DC | PRN
  Administered 2013-05-28 – 2013-05-29 (×4): 2 mg via INTRAVENOUS
  Filled 2013-05-28 (×4): qty 1

## 2013-05-28 MED ORDER — DOPAMINE-DEXTROSE 3.2-5 MG/ML-% IV SOLN: 3.0000 ug/kg/min | INTRAVENOUS | Status: DC

## 2013-05-28 MED ORDER — VANCOMYCIN HCL 1000 MG IV SOLR
1000.0000 mg | INTRAVENOUS | Status: DC | PRN
  Administered 2013-05-28: 1000 mg via INTRAVENOUS

## 2013-05-28 MED ORDER — PROTAMINE SULFATE 10 MG/ML IV SOLN
INTRAVENOUS | Status: AC
Start: 2013-05-28 — End: 2013-05-28
  Filled 2013-05-28: qty 5

## 2013-05-28 MED ORDER — EPHEDRINE SULFATE 50 MG/ML IJ SOLN
INTRAMUSCULAR | Status: DC | PRN
  Administered 2013-05-28: 5 mg via INTRAVENOUS
  Administered 2013-05-28: 10 mg via INTRAVENOUS
  Administered 2013-05-28: 5 mg via INTRAVENOUS
  Administered 2013-05-28: 10 mg via INTRAVENOUS

## 2013-05-28 MED ORDER — VANCOMYCIN HCL IN DEXTROSE 1-5 GM/200ML-% IV SOLN
1000.0000 mg | Freq: Two times a day (BID) | INTRAVENOUS | Status: AC
  Administered 2013-05-28 – 2013-05-29 (×2): 1000 mg via INTRAVENOUS
  Filled 2013-05-28 (×2): qty 200

## 2013-05-28 MED ORDER — SODIUM CHLORIDE 0.9 % IV SOLN
INTRAVENOUS | Status: DC
  Administered 2013-05-28: 23:00:00 via INTRAVENOUS

## 2013-05-28 MED ORDER — ONDANSETRON HCL 4 MG/2ML IJ SOLN
INTRAMUSCULAR | Status: DC | PRN
  Administered 2013-05-28: 4 mg via INTRAVENOUS

## 2013-05-28 MED ORDER — ROCURONIUM BROMIDE 50 MG/5ML IV SOLN
INTRAVENOUS | Status: AC
  Filled 2013-05-28: qty 1

## 2013-05-28 MED ORDER — LIDOCAINE HCL (CARDIAC) 20 MG/ML IV SOLN
INTRAVENOUS | Status: DC | PRN
  Administered 2013-05-28 (×2): 100 mg via INTRATRACHEAL
  Administered 2013-05-28: 100 mg via INTRAVENOUS

## 2013-05-28 MED ORDER — GLYCOPYRROLATE 0.2 MG/ML IJ SOLN
INTRAMUSCULAR | Status: AC
  Filled 2013-05-28: qty 3

## 2013-05-28 MED ORDER — FENTANYL CITRATE 0.05 MG/ML IJ SOLN
INTRAMUSCULAR | Status: AC
  Filled 2013-05-28: qty 5

## 2013-05-28 MED ORDER — PROTAMINE SULFATE 10 MG/ML IV SOLN
INTRAVENOUS | Status: DC | PRN
  Administered 2013-05-28: 5 mg via INTRAVENOUS

## 2013-05-28 MED ORDER — SODIUM CHLORIDE 0.9 % IJ SOLN
INTRAMUSCULAR | Status: AC
  Filled 2013-05-28: qty 10

## 2013-05-28 MED ORDER — LIDOCAINE HCL (PF) 1 % IJ SOLN
INTRAMUSCULAR | Status: AC
  Filled 2013-05-28: qty 30

## 2013-05-28 MED ORDER — 0.9 % SODIUM CHLORIDE (POUR BTL) OPTIME
TOPICAL | Status: DC | PRN
  Administered 2013-05-28: 1000 mL

## 2013-05-28 MED ORDER — PHENOL 1.4 % MT LIQD
1.0000 | OROMUCOSAL | Status: DC | PRN
  Administered 2013-05-29: 1 via OROMUCOSAL
  Filled 2013-05-28: qty 177

## 2013-05-28 MED ORDER — ARTIFICIAL TEARS OP OINT
TOPICAL_OINTMENT | OPHTHALMIC | Status: AC
  Filled 2013-05-28: qty 3.5

## 2013-05-28 MED ORDER — ONDANSETRON HCL 4 MG/2ML IJ SOLN: 4.0000 mg | Freq: Four times a day (QID) | INTRAMUSCULAR | Status: DC | PRN

## 2013-05-28 MED ORDER — PHENYLEPHRINE HCL 10 MG/ML IJ SOLN
INTRAMUSCULAR | Status: DC | PRN
  Administered 2013-05-28 (×2): 80 ug via INTRAVENOUS

## 2013-05-28 MED ORDER — POTASSIUM CHLORIDE CRYS ER 20 MEQ PO TBCR: 20.0000 meq | EXTENDED_RELEASE_TABLET | Freq: Every day | ORAL | Status: DC | PRN

## 2013-05-28 MED ORDER — ROCURONIUM BROMIDE 100 MG/10ML IV SOLN
INTRAVENOUS | Status: DC | PRN
  Administered 2013-05-28: 30 mg via INTRAVENOUS
  Administered 2013-05-28: 20 mg via INTRAVENOUS

## 2013-05-28 MED ORDER — ALUM & MAG HYDROXIDE-SIMETH 200-200-20 MG/5ML PO SUSP: 15.0000 mL | ORAL | Status: DC | PRN

## 2013-05-28 MED ORDER — LACTATED RINGERS IV SOLN
INTRAVENOUS | Status: DC | PRN
  Administered 2013-05-28: 09:00:00 via INTRAVENOUS

## 2013-05-28 MED ORDER — ONDANSETRON HCL 4 MG/2ML IJ SOLN
INTRAMUSCULAR | Status: AC
  Filled 2013-05-28: qty 2

## 2013-05-28 MED ORDER — VANCOMYCIN HCL IN DEXTROSE 1-5 GM/200ML-% IV SOLN
INTRAVENOUS | Status: AC
Start: 2013-05-28 — End: 2013-05-28
  Filled 2013-05-28: qty 200

## 2013-05-28 MED ORDER — NEOSTIGMINE METHYLSULFATE 1 MG/ML IJ SOLN
INTRAMUSCULAR | Status: DC | PRN
  Administered 2013-05-28: 4 mg via INTRAVENOUS

## 2013-05-28 MED ORDER — HYDROMORPHONE HCL PF 1 MG/ML IJ SOLN: 0.2500 mg | INTRAMUSCULAR | Status: DC | PRN

## 2013-05-28 MED ORDER — FENTANYL CITRATE 0.05 MG/ML IJ SOLN
INTRAMUSCULAR | Status: DC | PRN
  Administered 2013-05-28 (×2): 50 ug via INTRAVENOUS
  Administered 2013-05-28: 100 ug via INTRAVENOUS
  Administered 2013-05-28: 50 ug via INTRAVENOUS

## 2013-05-28 MED ORDER — HYDRALAZINE HCL 20 MG/ML IJ SOLN: 10.0000 mg | INTRAMUSCULAR | Status: DC | PRN

## 2013-05-28 MED ORDER — PROPOFOL 10 MG/ML IV BOLUS
INTRAVENOUS | Status: DC | PRN
  Administered 2013-05-28 (×2): 50 mg via INTRAVENOUS

## 2013-05-28 MED ORDER — GLYCOPYRROLATE 0.2 MG/ML IJ SOLN
INTRAMUSCULAR | Status: DC | PRN
  Administered 2013-05-28: 0.6 mg via INTRAVENOUS
  Administered 2013-05-28 (×2): 0.2 mg via INTRAVENOUS

## 2013-05-28 MED ORDER — NEOSTIGMINE METHYLSULFATE 1 MG/ML IJ SOLN
INTRAMUSCULAR | Status: AC
  Filled 2013-05-28: qty 10

## 2013-05-28 MED ORDER — HEPARIN SODIUM (PORCINE) 1000 UNIT/ML IJ SOLN
INTRAMUSCULAR | Status: AC
  Filled 2013-05-28: qty 1

## 2013-05-28 MED ORDER — PHENYLEPHRINE HCL 10 MG/ML IJ SOLN
10.0000 mg | INTRAVENOUS | Status: DC | PRN
  Administered 2013-05-28: 50 ug/min via INTRAVENOUS

## 2013-05-28 MED ORDER — LACTATED RINGERS IV SOLN
INTRAVENOUS | Status: DC | PRN
  Administered 2013-05-28: 10:00:00 via INTRAVENOUS

## 2013-05-28 MED ORDER — GLYCOPYRROLATE 0.2 MG/ML IJ SOLN
INTRAMUSCULAR | Status: AC
  Filled 2013-05-28: qty 2

## 2013-05-28 MED ORDER — EPHEDRINE SULFATE 50 MG/ML IJ SOLN
INTRAMUSCULAR | Status: AC
  Filled 2013-05-28: qty 1

## 2013-05-28 MED ORDER — PHENYLEPHRINE 40 MCG/ML (10ML) SYRINGE FOR IV PUSH (FOR BLOOD PRESSURE SUPPORT)
PREFILLED_SYRINGE | INTRAVENOUS | Status: AC
  Filled 2013-05-28: qty 10

## 2013-05-28 SURGICAL SUPPLY — 43 items
BENZOIN TINCTURE PRP APPL 2/3 (GAUZE/BANDAGES/DRESSINGS) ×3 IMPLANT
CANISTER SUCTION 2500CC (MISCELLANEOUS) ×3 IMPLANT
CATH ROBINSON RED A/P 18FR (CATHETERS) ×3 IMPLANT
CLIP LIGATING EXTRA MED SLVR (CLIP) ×3 IMPLANT
CLIP LIGATING EXTRA SM BLUE (MISCELLANEOUS) ×3 IMPLANT
CLOSURE WOUND 1/2 X4 (GAUZE/BANDAGES/DRESSINGS) ×1
COVER SURGICAL LIGHT HANDLE (MISCELLANEOUS) ×3 IMPLANT
CRADLE DONUT ADULT HEAD (MISCELLANEOUS) ×3 IMPLANT
DECANTER SPIKE VIAL GLASS SM (MISCELLANEOUS) IMPLANT
DRAIN HEMOVAC 1/8 X 5 (WOUND CARE) IMPLANT
DRAPE WARM FLUID 44X44 (DRAPE) ×3 IMPLANT
DRSG COVADERM 4X6 (GAUZE/BANDAGES/DRESSINGS) ×3 IMPLANT
ELECT REM PT RETURN 9FT ADLT (ELECTROSURGICAL) ×3
ELECTRODE REM PT RTRN 9FT ADLT (ELECTROSURGICAL) ×1 IMPLANT
EVACUATOR SILICONE 100CC (DRAIN) IMPLANT
GEL ULTRASOUND 20GR AQUASONIC (MISCELLANEOUS) IMPLANT
GLOVE SS BIOGEL STRL SZ 7.5 (GLOVE) ×1 IMPLANT
GLOVE SUPERSENSE BIOGEL SZ 7.5 (GLOVE) ×2
GOWN STRL REUS W/ TWL LRG LVL3 (GOWN DISPOSABLE) ×3 IMPLANT
GOWN STRL REUS W/TWL LRG LVL3 (GOWN DISPOSABLE) ×6
KIT BASIN OR (CUSTOM PROCEDURE TRAY) ×3 IMPLANT
KIT ROOM TURNOVER OR (KITS) ×3 IMPLANT
NEEDLE 22X1 1/2 (OR ONLY) (NEEDLE) IMPLANT
NS IRRIG 1000ML POUR BTL (IV SOLUTION) ×6 IMPLANT
PACK CAROTID (CUSTOM PROCEDURE TRAY) ×3 IMPLANT
PAD ARMBOARD 7.5X6 YLW CONV (MISCELLANEOUS) ×6 IMPLANT
PATCH HEMASHIELD 8X75 (Vascular Products) ×3 IMPLANT
SHUNT CAROTID BYPASS 10 (VASCULAR PRODUCTS) ×3 IMPLANT
SHUNT CAROTID BYPASS 12FRX15.5 (VASCULAR PRODUCTS) IMPLANT
SPONGE GAUZE 4X4 12PLY STER LF (GAUZE/BANDAGES/DRESSINGS) ×3 IMPLANT
STRIP CLOSURE SKIN 1/2X4 (GAUZE/BANDAGES/DRESSINGS) ×2 IMPLANT
SUT ETHILON 3 0 PS 1 (SUTURE) IMPLANT
SUT PROLENE 6 0 CC (SUTURE) ×3 IMPLANT
SUT SILK 3 0 (SUTURE)
SUT SILK 3-0 18XBRD TIE 12 (SUTURE) IMPLANT
SUT VIC AB 3-0 SH 27 (SUTURE) ×4
SUT VIC AB 3-0 SH 27X BRD (SUTURE) ×2 IMPLANT
SUT VICRYL 4-0 PS2 18IN ABS (SUTURE) ×3 IMPLANT
SYR CONTROL 10ML LL (SYRINGE) IMPLANT
TAPE CLOTH SURG 4X10 WHT LF (GAUZE/BANDAGES/DRESSINGS) ×3 IMPLANT
TOWEL OR 17X24 6PK STRL BLUE (TOWEL DISPOSABLE) ×3 IMPLANT
TOWEL OR 17X26 10 PK STRL BLUE (TOWEL DISPOSABLE) ×3 IMPLANT
WATER STERILE IRR 1000ML POUR (IV SOLUTION) ×3 IMPLANT

## 2013-05-28 NOTE — Progress Notes (Signed)
Pt being transported to OR for procedure. Cell phone, glasses, and wedding band placed in a biohazard bag in front of patient chart. Razor, clothes and cell phone charger placed in patient belonging bag and taken to periop. Dorna Bloom, RN

## 2013-05-28 NOTE — Progress Notes (Signed)
Patient currently in [facility]. Stroke team will follow up tomorrow.  Burnetta Sabin, MSN, RN, ANVP-BC, ANP-BC, [facility] Pager: [phone] 05/28/2013 12:24 PM  I agree with above Antony Contras, MD

## 2013-05-28 NOTE — Progress Notes (Signed)
Pt c/o unable to void, only void 25cc in urinal, bladder scanned for 495cc and in and out for 600cc of amber color urine. Suezanne Cheshire

## 2013-05-28 NOTE — Progress Notes (Signed)
Dr. Sherren Kerns notified that IV attempted x 3. He will address. One attempt in right wrist by Majel Homer., site unremarkable. Attempted twice by Jenny Reichmann, CRNA, in right hand and right antecubital, both sites unremarkable.

## 2013-05-28 NOTE — Op Note (Signed)
     Patient name: KALIJAH ZEISS MRN: [id number] DOB: [birth year] Sex: male  05/20/2013 - 05/28/2013 Pre-operative Diagnosis: Symptomatic right carotid stenosis Post-operative diagnosis:  Same Surgeon:  Rosetta Posner, M.D. Assistants:  Theda Sers Procedure:    right carotid Endarterectomy with Dacron patch angioplasty Anesthesia:  General Blood Loss:  See anesthesia record   Indications for surgery:  Critical right internal carotid artery stenosis with right brain stroke by MR  Procedure in detail:  The patient was taken to the operating and placed in the supine position. The neck was prepped and draped in the usual sterile fashion. An incision was made anterior to the sternocleidomastoid muscle and continued with electrocautery through the platysma muscle. The muscle was retracted posteriorly and the carotid sheath was opened. The facial vein was ligated with 2-0 silk ties and divided. The common carotid artery was encircled with an umbilical tape and Rummel tourniquet. Dissection was continued onto the carotid bifurcation. The superior thyroid artery was controlled with a 2-0 silk Potts tie. The external carotid organ was encircled with a vessel loop and the internal carotid was encircled with umbilical tape and Rummel tourniquet. The hypoglossal and vagus nerves were identified and preserved.  The patient was given systemic heparinization. After adequate circulation time, the internal,external and common carotid arteries were occluded. The common carotid was opened with an 11 blade and the arteriotomy was continued with Potts scissors onto the internal carotid artery.   The endarterectomy was begun on the common carotid artery  plaque was divided proximally with Potts scissors. The endarterectomy was continued onto the carotid bifurcation. The external carotid was endarterectomized by eversion technique and the internal carotid artery was endarterectomized in an open fashion. Remaining debris was  removed from the endarterectomy plane. A Dacron patch was brought to the field and sewn as a patch angioplasty. Prior to completing the anastomosis,  the usual flushing maneuvers were undertaken. The anastomosis was then completed and flow was restored first to the external and then the internal carotid artery. Excellent flow characteristics were noted with hand-held Doppler in the internal and external carotid arteries.  The patient was given protamine to reverse the heparin. Hemostasis was obtained with electrocautery. The wounds were irrigated with saline. The wound was closed by first reapproximating the sternocleidomastoid muscle over the carotid artery with interrupted 3-0 Vicryl sutures. Next, the platysma was closed with a running 3-0 Vicryl suture. The skin was closed with a 4-0 subcuticular Vicryl suture. Benzoin and Steri-Strips were applied to the incision. A sterile dressing was placed over the incision. All sponge and needle counts were correct. The patient was awakened in the operating room, neurologically intact. They were transferred to the PACU in stable condition.  Carotid stenosis at surgery: Greater than 95%  Disposition:  To PACU in stable condition,neurologically intact  Relevant Operative Details:  Normal internal carotid distal to the plaque  Rosetta Posner, M.D. [facility] Office: [phone] Pager:  [phone]

## 2013-05-28 NOTE — Preoperative (Signed)
Beta Blockers   Reason not to administer Beta Blockers:Not Applicable 

## 2013-05-28 NOTE — Transfer of Care (Signed)
Immediate Anesthesia Transfer of Care Note  Patient: Zachary [name]  Procedure(s) Performed: Procedure(s): ENDARTERECTOMY CAROTID (Right)  Patient Location: PACU  Anesthesia Type:General  Level of Consciousness: awake, alert  and oriented  Airway & Oxygen Therapy: Patient connected to face mask oxygen  Post-op Assessment: Report given to PACU RN  Post vital signs: stable  Complications: No apparent anesthesia complications

## 2013-05-28 NOTE — Anesthesia Preprocedure Evaluation (Addendum)
Anesthesia Evaluation  Patient identified by MRN, date of birth, ID band Patient awake    Reviewed: Allergy & Precautions, H&P , NPO status , Patient's Chart, lab work & pertinent test results  Airway Mallampati: II TM Distance: >3 FB Neck ROM: Full    Dental  (+) Dental Advisory Given and Teeth Intact   Pulmonary former smoker,          Cardiovascular hypertension, + Peripheral Vascular Disease + dysrhythmias     Neuro/Psych CVA, Residual Symptoms    GI/Hepatic   Endo/Other  diabetes  Renal/GU      Musculoskeletal   Abdominal   Peds  Hematology   Anesthesia Other Findings   Reproductive/Obstetrics                         Anesthesia Physical Anesthesia Plan  ASA: III  Anesthesia Plan: General   Post-op Pain Management:    Induction: Intravenous  Airway Management Planned: Oral ETT  Additional Equipment: Arterial line  Intra-op Plan:   Post-operative Plan: Extubation in OR and Possible Post-op intubation/ventilation  Informed Consent: I have reviewed the patients History and Physical, chart, labs and discussed the procedure including the risks, benefits and alternatives for the proposed anesthesia with the patient or authorized representative who has indicated his/her understanding and acceptance.   Dental advisory given  Plan Discussed with: Anesthesiologist and Surgeon  Anesthesia Plan Comments:        Anesthesia Quick Evaluation

## 2013-05-28 NOTE — Anesthesia Procedure Notes (Signed)
Procedure Name: Intubation Date/Time: 05/28/2013 9:35 AM Performed by: Carola Frost Pre-anesthesia Checklist: Patient identified, Timeout performed, Emergency Drugs available, Suction available and Patient being monitored Patient Re-evaluated:Patient Re-evaluated prior to inductionOxygen Delivery Method: Circle system utilized Preoxygenation: Pre-oxygenation with 100% oxygen Intubation Type: IV induction Ventilation: Mask ventilation without difficulty Laryngoscope Size: Mac and 4 Grade View: Grade II Tube type: Oral Tube size: 7.5 mm Number of attempts: 1 Airway Equipment and Method: Stylet Placement Confirmation: positive ETCO2,  ETT inserted through vocal cords under direct vision and breath sounds checked- equal and bilateral Secured at: 24 cm Tube secured with: Tape Dental Injury: Teeth and Oropharynx as per pre-operative assessment

## 2013-05-28 NOTE — Progress Notes (Signed)
Put on 8L and pulled up higher in bed and elevated head of bed.

## 2013-05-28 NOTE — Progress Notes (Signed)
CSW (Clinical Education officer, museum)  continues to follow and update facility.  Will help with discharge to [facility] when medically stable.  [place], [place]

## 2013-05-28 NOTE — H&P (View-Only) (Signed)
Subjective: Interval History: none.. no new neurologic deficits.  Objective: Vital signs in last 24 hours: Temp:  [97.4 F (36.3 C)-98.6 F (37 C)] 97.7 F (36.5 C) (02/06 0723) Pulse Rate:  [65-78] 68 (02/06 0723) Resp:  [16-20] 16 (02/06 0723) BP: (128-172)/(58-71) 128/70 mmHg (02/06 0723) SpO2:  [93 %-98 %] 93 % (02/06 0723)  Intake/Output from previous day: 02/05 0701 - 02/06 0700 In: -  Out: 1101 [Urine:1100; Stool:1] Intake/Output this shift:    Alert oriented, no focal weakness.  Lab Results: No results found for this basename: WBC, HGB, HCT, PLT,  in the last 72 hours BMET No results found for this basename: NA, K, CL, CO2, GLUCOSE, BUN, CREATININE, CALCIUM,  in the last 72 hours  Studies/Results: Ct Angio Head W/cm &/or Wo Cm  05/23/2013   CLINICAL DATA:  80 year old male with small right hemisphere acute infarct. Weakness. Abnormal appearance of the distal right vertebral artery, right ICA siphon and right ICA terminus. Initial encounter.  EXAM: CT ANGIOGRAPHY HEAD AND NECK  TECHNIQUE: Multidetector CT imaging of the head and neck was performed using the standard protocol during bolus administration of intravenous contrast. Multiplanar CT image reconstructions including MIPs were obtained to evaluate the vascular anatomy. Carotid stenosis measurements (when applicable) are obtained utilizing NASCET criteria, using the distal internal carotid diameter as the denominator.  CONTRAST:  30mL OMNIPAQUE IOHEXOL 350 MG/ML SOLN  COMPARISON:  MRI and intracranial MRA 12/09/2013.  MRA 05/01/2012.  FINDINGS: CTA HEAD FINDINGS  Stable cerebral volume. Stable gray-white matter differentiation throughout the brain. . Stable hypodensity at the superior right para rolandic cortex likely corresponding to the recent diffusion finding (series 2, image 25). No acute intracranial hemorrhage identified. No ventriculomegaly. No midline shift, mass effect, or evidence of intracranial mass lesion. No  abnormal enhancement identified. No acute osseous abnormality identified. Visualized scalp soft tissues are within normal limits.  VASCULAR FINDINGS:  Major intracranial venous structures appear to be enhancing.  The distal left vertebral artery is patent with calcified plaque resulting in mild to moderate stenosis. The left PICA origin is patent. The vertebrobasilar junction is patent.  The reconstituted distal right vertebral artery remains patent intracranially, but is diminutive. The right PICA origin is patent. The right vertebral is diminutive or stenotic be on the origin of PICA.  The basilar artery is patent without stenosis. SCA and right PCA origins are normal. Fetal type left PCA origin. Right posterior communicating artery diminutive or absent. Bilateral PCA branches within normal limits.  The left ICA siphon is patent but is moderately stenosed in the petrous segment (series 6, image 128) by what appears to be soft plaque (see also coronal image 97). This appearance is stable from the recent MRA. Beyond this level, the left ICA siphon is patent without hemodynamically significant stenosis despite extensive calcified plaque. The left ICA terminus is patent. The left ophthalmic and posterior communicating artery origins are within normal limits. Left MCA and ACA origins are normal.  Anterior communicating artery is diminutive or absent. Asymmetrically decreased enhancement of the right ACA, which remains patent. Normal left ACA.  The right ICA siphon is patent but demonstrates extensive calcified plaque in the cavernous segment. This results in moderate stenosis in the cavernous segment, stable from the recent MRA. Asymmetric decreased distal right ICA enhancement. Ophthalmic artery origin within normal limits. Right ICA terminus remains patent. Right MCA and ACA origins are patent.  Mildly decreased enhancement of the proximal right ACA in conjunction with the decreased right ICA  enhancement.  Asymmetrically decreased enhancement of the right MCA, but no major MCA branch occlusion identified. The left MCA branches are normal.  Review of the MIP images confirms the above findings.  CTA NECK FINDINGS  Upper lungs demonstrate emphysema and dependent opacity most compatible with atelectasis. Mediastinal lipomatosis. Negative thyroid, larynx, pharynx, parapharyngeal spaces, retropharyngeal space, sublingual space, submandibular glands and parotid glands. Negative orbits soft tissues. Minor paranasal sinus mucosal thickening. No cervical lymphadenopathy. Cervical spine and left TMJ degeneration. Advanced cervical disc and endplate degeneration. At least mild degenerative spinal stenosis at C5-C6. No acute osseous abnormality identified.  VASCULAR FINDINGS:  Visible central pulmonary arteries are patent. Moderate calcified plaque throughout the aortic arch. Great vessel origins are patent.  No right CCA origin stenosis. Soft and calcified plaque throughout the right CCA, resulting an up to 50 % stenosis with respect to the distal vessel at the level of the thyroid. The right carotid remains patent to the bifurcation. Abundant soft and calcified plaque in the right ICA origin and bulb resulting an very high-grade stenosis and a radiographic string sign over a distance of 1 cm. See series 6, images 81- 91. The cervical right ICA remains patent despite this finding but demonstrates additional soft plaque distally. Just proximal to the skullbase there is subsequent stenosis of up to 60-65 % with respect to the distal vessel.  Bulky calcified plaque at the right subclavian artery origin resulting in high-grade stenosis (up to 75 % with respect to the distal vessel). Tortuous proximal right vertebral artery which is severely stenotic or occluded along a short segment of the distal right V1 (coronal image 148). The right vertebral artery is reconstituted at the C6 level, but is diminutive and poorly enhancing  throughout the neck and to the skullbase. The vessel is again reconstituted from muscular branches just proximal to the skullbase (series 6, image 113).  Calcified plaque at the left CCA origin results in less than 50 % stenosis with respect to the distal vessel. Soft and calcified plaque throughout the left CCA. Left carotid bifurcation remains widely patent despite plaque. At the distal bulb there is bulky plaque resulting in stenosis of up to 60-65 % with respect to the distal vessel. Beyond this level, the cervical left ICA is tortuous but otherwise negative.  No proximal left subclavian artery stenosis despite plaque. Moderate stenosis at the left vertebral artery origin related to bulky calcified plaque (series 6, image 43). However, the left vertebral artery remains patent. There is a high-grade stenosis due to soft plaque at the left C6 transverse foramen level (series 6, image 68). Beyond this, the left vertebral artery is patent without additional stenosis to the skullbase.  Review of the MIP images confirms the above findings.  IMPRESSION: 1. Widespread severe atherosclerosis with multifocal significant arterial stenoses. 2. With regard to the carotids and anterior circulation: - 50% stenosis right CCA. - very high-grade stenosis with radiographic string sign at the right ICA origin over a distance of 1 cm. The right ICA remains patent despite this finding, but is hypo enhancing compared to the left side. - left ICA distal bulb stenosis up to 60 -65%. - extensive bilateral ICA siphon calcified plaque, resulting in bilateral siphon moderate stenosis. - hypoenhancing right ICA terminus, right ACA and right MCA, but no major intracranial branch occlusion. 3. With regard to the vertebral arteries and posterior circulation: - 75% right subclavian artery origin stenosis. 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 -reconstituted distal right vertebral artery from  muscular branches just below the skullbase. Right PICA is patent, the right vertebral be on the PICA is diminutive/stenotic. - moderate left vertebral artery origin stenosis. Severe stenosis of the proximal left V2 segment related to soft plaque at the C6 level. 4. Stable CT appearance of the brain. Salient findings by telephone with NP SHARON BIBY on 05/23/2013 at 13:26 .   Electronically Signed   By: Lars Pinks M.D.   On: 05/23/2013 13:27   Dg Chest 2 View  05/20/2013   CLINICAL DATA:  Generalized weakness.  Shortness of breath.  EXAM: CHEST  2 VIEW  COMPARISON:  PA and lateral chest 05/15/2013.  FINDINGS: Lungs are clear. Heart size is normal. No pneumothorax or pleural effusion.  IMPRESSION: No acute disease.   Electronically Signed   By: Inge Rise M.D.   On: 05/20/2013 19:27   Dg Chest 2 View  05/15/2013   CLINICAL DATA:  Shortness of breath, fatigue  EXAM: CHEST  2 VIEW  COMPARISON:  DG CHEST 1V PORT dated 05/08/2012  FINDINGS: Low lung volumes. There is blunting of the left costophrenic angle. No focal regions consolidation nor focal infiltrates. Cardiac silhouette moderately enlarged. Aorta is tortuous. Atherosclerotic calcifications identified within the aorta. The osseous structures are unremarkable.  IMPRESSION: Blunting of the left costophrenic angle consistent with a trace effusion. Otherwise no evidence of acute cardiopulmonary disease.   Electronically Signed   By: Margaree Mackintosh M.D.   On: 05/15/2013 15:08   Ct Head Wo Contrast  05/20/2013   CLINICAL DATA:  Weakness worsening over 3 days. Cough, constipation and body aches.  EXAM: CT HEAD WITHOUT CONTRAST  TECHNIQUE: Contiguous axial images were obtained from the base of the skull through the vertex without intravenous contrast.  COMPARISON:  MRI of the brain May 01, 2012  FINDINGS: Moderate ventriculomegaly, out of proportion of the degree of sulcal enlargement which is mildly effaced at the convexities. No intraparenchymal hemorrhage,  mass effect nor midline shift. Confluent supratentorial white matter hypodensities are within normal range for patient's age and though non-specific suggest sequelae of chronic small vessel ischemic disease. No acute large vascular territory infarcts. Remote right basal ganglia globus pallidus lacunar infarct.  No abnormal extra-axial fluid collections. Basal cisterns are patent. Moderate calcific atherosclerosis of the carotid siphons and included vertebral arteries.  No skull fracture. Trace sphenoid ethmoidal mucosal thickening without paranasal sinus air-fluid levels. The mastoid air cells are well aerated. Severe left temporomandibular osteoarthrosis. Soft tissue within the external auditory canals may reflect cerumen. The included ocular globes and orbital contents are non-suspicious. Status post bilateral ocular lens implants.  IMPRESSION: No acute intracranial process.  Mild sulcal effacement of the convexities, disproportionate to the degree of sulcal enlargement can be seen with normal pressure hydrocephalus.  Moderate to severe white matter changes suggest chronic small vessel ischemic disease with remote right basal ganglia lacunar infarct, similar.   Electronically Signed   By: Elon Alas   On: 05/20/2013 23:25   Ct Angio Neck W/cm &/or Wo/cm  05/23/2013   CLINICAL DATA:  80 year old male with small right hemisphere acute infarct. Weakness. Abnormal appearance of the distal right vertebral artery, right ICA siphon and right ICA terminus. Initial encounter.  EXAM: CT ANGIOGRAPHY HEAD AND NECK  TECHNIQUE: Multidetector CT imaging of the head and neck was performed using the standard protocol during bolus administration of intravenous contrast. Multiplanar CT image reconstructions including MIPs were obtained to evaluate the vascular  anatomy. Carotid stenosis measurements (when applicable) are obtained utilizing NASCET criteria, using the distal internal carotid diameter as the denominator.   CONTRAST:  77mL OMNIPAQUE IOHEXOL 350 MG/ML SOLN  COMPARISON:  MRI and intracranial MRA 12/09/2013.  MRA 05/01/2012.  FINDINGS: CTA HEAD FINDINGS  Stable cerebral volume. Stable gray-white matter differentiation throughout the brain. . Stable hypodensity at the superior right para rolandic cortex likely corresponding to the recent diffusion finding (series 2, image 25). No acute intracranial hemorrhage identified. No ventriculomegaly. No midline shift, mass effect, or evidence of intracranial mass lesion. No abnormal enhancement identified. No acute osseous abnormality identified. Visualized scalp soft tissues are within normal limits.  VASCULAR FINDINGS:  Major intracranial venous structures appear to be enhancing.  The distal left vertebral artery is patent with calcified plaque resulting in mild to moderate stenosis. The left PICA origin is patent. The vertebrobasilar junction is patent.  The reconstituted distal right vertebral artery remains patent intracranially, but is diminutive. The right PICA origin is patent. The right vertebral is diminutive or stenotic be on the origin of PICA.  The basilar artery is patent without stenosis. SCA and right PCA origins are normal. Fetal type left PCA origin. Right posterior communicating artery diminutive or absent. Bilateral PCA branches within normal limits.  The left ICA siphon is patent but is moderately stenosed in the petrous segment (series 6, image 128) by what appears to be soft plaque (see also coronal image 97). This appearance is stable from the recent MRA. Beyond this level, the left ICA siphon is patent without hemodynamically significant stenosis despite extensive calcified plaque. The left ICA terminus is patent. The left ophthalmic and posterior communicating artery origins are within normal limits. Left MCA and ACA origins are normal.  Anterior communicating artery is diminutive or absent. Asymmetrically decreased enhancement of the right ACA, which  remains patent. Normal left ACA.  The right ICA siphon is patent but demonstrates extensive calcified plaque in the cavernous segment. This results in moderate stenosis in the cavernous segment, stable from the recent MRA. Asymmetric decreased distal right ICA enhancement. Ophthalmic artery origin within normal limits. Right ICA terminus remains patent. Right MCA and ACA origins are patent.  Mildly decreased enhancement of the proximal right ACA in conjunction with the decreased right ICA enhancement. Asymmetrically decreased enhancement of the right MCA, but no major MCA branch occlusion identified. The left MCA branches are normal.  Review of the MIP images confirms the above findings.  CTA NECK FINDINGS  Upper lungs demonstrate emphysema and dependent opacity most compatible with atelectasis. Mediastinal lipomatosis. Negative thyroid, larynx, pharynx, parapharyngeal spaces, retropharyngeal space, sublingual space, submandibular glands and parotid glands. Negative orbits soft tissues. Minor paranasal sinus mucosal thickening. No cervical lymphadenopathy. Cervical spine and left TMJ degeneration. Advanced cervical disc and endplate degeneration. At least mild degenerative spinal stenosis at C5-C6. No acute osseous abnormality identified.  VASCULAR FINDINGS:  Visible central pulmonary arteries are patent. Moderate calcified plaque throughout the aortic arch. Great vessel origins are patent.  No right CCA origin stenosis. Soft and calcified plaque throughout the right CCA, resulting an up to 50 % stenosis with respect to the distal vessel at the level of the thyroid. The right carotid remains patent to the bifurcation. Abundant soft and calcified plaque in the right ICA origin and bulb resulting an very high-grade stenosis and a radiographic string sign over a distance of 1 cm. See series 6, images 81- 91. The cervical right ICA remains patent despite this finding but demonstrates  additional soft plaque distally.  Just proximal to the skullbase there is subsequent stenosis of up to 60-65 % with respect to the distal vessel.  Bulky calcified plaque at the right subclavian artery origin resulting in high-grade stenosis (up to 75 % with respect to the distal vessel). Tortuous proximal right vertebral artery which is severely stenotic or occluded along a short segment of the distal right V1 (coronal image 148). The right vertebral artery is reconstituted at the C6 level, but is diminutive and poorly enhancing throughout the neck and to the skullbase. The vessel is again reconstituted from muscular branches just proximal to the skullbase (series 6, image 113).  Calcified plaque at the left CCA origin results in less than 50 % stenosis with respect to the distal vessel. Soft and calcified plaque throughout the left CCA. Left carotid bifurcation remains widely patent despite plaque. At the distal bulb there is bulky plaque resulting in stenosis of up to 60-65 % with respect to the distal vessel. Beyond this level, the cervical left ICA is tortuous but otherwise negative.  No proximal left subclavian artery stenosis despite plaque. Moderate stenosis at the left vertebral artery origin related to bulky calcified plaque (series 6, image 43). However, the left vertebral artery remains patent. There is a high-grade stenosis due to soft plaque at the left C6 transverse foramen level (series 6, image 68). Beyond this, the left vertebral artery is patent without additional stenosis to the skullbase.  Review of the MIP images confirms the above findings.  IMPRESSION: 1. Widespread severe atherosclerosis with multifocal significant arterial stenoses. 2. With regard to the carotids and anterior circulation: - 50% stenosis right CCA. - very high-grade stenosis with radiographic string sign at the right ICA origin over a distance of 1 cm. The right ICA remains patent despite this finding, but is hypo enhancing compared to the left side. - left  ICA distal bulb stenosis up to 60 -65%. - extensive bilateral ICA siphon calcified plaque, resulting in bilateral siphon moderate stenosis. - hypoenhancing right ICA terminus, right ACA and right MCA, but no major intracranial branch occlusion. 3. With regard to the vertebral arteries and posterior circulation: - 75% right subclavian artery origin stenosis. 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 -reconstituted distal right vertebral artery from muscular branches just below the skullbase. Right PICA is patent, the right vertebral be on the PICA is diminutive/stenotic. - moderate left vertebral artery origin stenosis. Severe stenosis of the proximal left V2 segment related to soft plaque at the C6 level. 4. Stable CT appearance of the brain. Salient findings by telephone with NP SHARON BIBY on 05/23/2013 at 13:26 .   Electronically Signed   By: Lars Pinks M.D.   On: 05/23/2013 13:27   Mr Jodene Nam Head Wo Contrast  05/22/2013   CLINICAL DATA:  Weakness.  Rule out stroke.  EXAM: MRI HEAD WITHOUT CONTRAST  MRA HEAD WITHOUT CONTRAST  TECHNIQUE: Multiplanar, multiecho pulse sequences of the brain and surrounding structures were obtained without intravenous contrast. Angiographic images of the head were obtained using MRA technique without contrast.  COMPARISON:  Head CT 05/20/2013 and brain MRI/ MRA 05/01/2012  FINDINGS: MRI HEAD FINDINGS  Images are mildly to moderately degraded by motion artifact. Incidental note is made of a partially empty sella. There is a small, acute infarct in the right frontoparietal region near the vertex likely involving the precentral gyrus. Remote lacunar infarcts are identified involving the right lentiform nucleus and  right corona radiata. Small, remote cortical infarcts are noted involving the left greater than right parietal lobes. There is moderate cerebral atrophy with enlargement of the ventricles and sylvian fissures and proportionately  less sulcal enlargement near the vertex, unchanged from prior MRI.  Foci of T2 hyperintensity within the periventricular white matter and brainstem do not appear significantly changed and are nonspecific but compatible with mild to moderate chronic small vessel ischemic disease. There is no evidence of intracranial hemorrhage, mass, midline shift, or extra-axial fluid collection. Prior bilateral cataract surgery is noted. Paranasal sinuses and mastoid air cells are clear. Moderate pannus is present at C1-2.  MRA HEAD FINDINGS  Images are mildly to moderately degraded by motion artifact. The visualized distal right vertebral artery appears very small in caliber with diminished flow related enhancement and vessel irregularity, more pronounced than on the prior MRI although there is more motion artifact on the current study. The visualized distal left vertebral artery is patent and dominant without evidence of stenosis. PICA origins are patent bilaterally. SCA origins are patent. Basilar artery is patent without stenosis. There is a fetal origin of the left PCA. There is a mild, focal stenosis of the P1 segment of the right PCA, not evident on the prior study.  Internal carotid arteries are patent from skullbase to carotid termini. The intracranial right ICA is smaller in caliber than the left ICA with less flow related enhancement than on the prior study. The right carotid siphon and proximal supraclinoid ICA appear moderately narrowed. The right A1 segment is mildly irregular without focal stenosis. ACAs are otherwise unremarkable. Diminished flow related enhancement is present in the right MCA near the bifurcation, although the more distal branches appear patent and of relatively normal caliber. Left MCA is unremarkable. No intracranial aneurysm is identified.  IMPRESSION: 1. Small, acute posterior right frontal infarct. 2. Unchanged appearance of cerebral atrophy and chronic small vessel ischemic disease with  remote lacunar infarcts as above. 3. No evidence of proximal intracranial arterial occlusion. There is diminished flow related enhancement and caliber of the distal right vertebral artery, intracranial right ICA, and right MCA near the bifurcation, which could reflect worsening intracranial atherosclerotic disease or be at least partly artifactual.   Electronically Signed   By: Logan Bores   On: 05/22/2013 08:28   Mr Brain Wo Contrast  05/22/2013   CLINICAL DATA:  Weakness.  Rule out stroke.  EXAM: MRI HEAD WITHOUT CONTRAST  MRA HEAD WITHOUT CONTRAST  TECHNIQUE: Multiplanar, multiecho pulse sequences of the brain and surrounding structures were obtained without intravenous contrast. Angiographic images of the head were obtained using MRA technique without contrast.  COMPARISON:  Head CT 05/20/2013 and brain MRI/ MRA 05/01/2012  FINDINGS: MRI HEAD FINDINGS  Images are mildly to moderately degraded by motion artifact. Incidental note is made of a partially empty sella. There is a small, acute infarct in the right frontoparietal region near the vertex likely involving the precentral gyrus. Remote lacunar infarcts are identified involving the right lentiform nucleus and right corona radiata. Small, remote cortical infarcts are noted involving the left greater than right parietal lobes. There is moderate cerebral atrophy with enlargement of the ventricles and sylvian fissures and proportionately less sulcal enlargement near the vertex, unchanged from prior MRI.  Foci of T2 hyperintensity within the periventricular white matter and brainstem do not appear significantly changed and are nonspecific but compatible with mild to moderate chronic small vessel ischemic disease. There is no evidence of intracranial hemorrhage, mass, midline  shift, or extra-axial fluid collection. Prior bilateral cataract surgery is noted. Paranasal sinuses and mastoid air cells are clear. Moderate pannus is present at C1-2.  MRA HEAD FINDINGS   Images are mildly to moderately degraded by motion artifact. The visualized distal right vertebral artery appears very small in caliber with diminished flow related enhancement and vessel irregularity, more pronounced than on the prior MRI although there is more motion artifact on the current study. The visualized distal left vertebral artery is patent and dominant without evidence of stenosis. PICA origins are patent bilaterally. SCA origins are patent. Basilar artery is patent without stenosis. There is a fetal origin of the left PCA. There is a mild, focal stenosis of the P1 segment of the right PCA, not evident on the prior study.  Internal carotid arteries are patent from skullbase to carotid termini. The intracranial right ICA is smaller in caliber than the left ICA with less flow related enhancement than on the prior study. The right carotid siphon and proximal supraclinoid ICA appear moderately narrowed. The right A1 segment is mildly irregular without focal stenosis. ACAs are otherwise unremarkable. Diminished flow related enhancement is present in the right MCA near the bifurcation, although the more distal branches appear patent and of relatively normal caliber. Left MCA is unremarkable. No intracranial aneurysm is identified.  IMPRESSION: 1. Small, acute posterior right frontal infarct. 2. Unchanged appearance of cerebral atrophy and chronic small vessel ischemic disease with remote lacunar infarcts as above. 3. No evidence of proximal intracranial arterial occlusion. There is diminished flow related enhancement and caliber of the distal right vertebral artery, intracranial right ICA, and right MCA near the bifurcation, which could reflect worsening intracranial atherosclerotic disease or be at least partly artifactual.   Electronically Signed   By: Logan Bores   On: 05/22/2013 08:28   Anti-infectives: Anti-infectives   None      Assessment/Plan: s/p Procedure(s): ENDARTERECTOMY CAROTID  (Right) Again discussed with the patient and also with his wife present. I discussed this with Dr.Sethi as well. All are in agreement for right carotid endarterectomy. The patient has a critical stenosis with the string sign and also has MRI proven stroke probably related to embolus from the bifurcation. Since he has not had a major new deficit we'll plan on surgery for Monday, February 9. As the procedure including 1-1/2% risk for stroke with surgery. Also explained expected recovery from this.   LOS: 5 days   Zachary [name] 05/25/2013, 10:37 AM

## 2013-05-28 NOTE — Interval H&P Note (Signed)
History and Physical Interval Note:  05/28/2013 7:12 AM  Zachary [name]  has presented today for surgery, with the diagnosis of Carotid stenosis with history of CVA  The various methods of treatment have been discussed with the patient and family. After consideration of risks, benefits and other options for treatment, the patient has consented to  Procedure(s): ENDARTERECTOMY CAROTID (Right) as a surgical intervention .  The patient's history has been reviewed, patient examined, no change in status, stable for surgery.  I have reviewed the patient's chart and labs.  Questions were answered to the patient's satisfaction.     Najia Hurlbutt

## 2013-05-28 NOTE — Plan of Care (Signed)
Problem: Discharge/Transitional Outcomes Goal: Independent mobility/functioning independent or with min Independent mobility/functioning independently or with minimal assistance  Outcome: Adequate for Discharge Pt ambulates with one assist and walker

## 2013-05-28 NOTE — Progress Notes (Signed)
Assessment/Plan: Principal Problem:   Weakness - background of generalized weakness, ? Apathetic Parkinson's. No changes there.  Active Problems:   Hypertension   Pulsus bigeminis   Dehydration   Diabetes mellitus   Acute ischemic stroke - no new issues. I note question of statin and he has been started on that. I need to check office chart, but he may have been on that in past and it was stopped due to weakness. However, it did not help much (if any) to stop it. Agree with trying it again.    Carotid artery disease - for surgery today. Will be ready for d/c to SNF rehab 2/10 or 2/11. Dr. [name]: may he go to SNF rehab on 2/10 if stable?   Subjective: Feeling okay. Ready for surgery. No new issues that he is aware of.   Objective:  Vital Signs: Filed Vitals:   05/27/13 1600 05/27/13 2000 05/28/13 0000 05/28/13 0400  BP: 143/72 145/62 144/56 144/53  Pulse: 73 70 71 57  Temp: 98.7 F (37.1 C) 98.5 F (36.9 C) 98.8 F (37.1 C) 98.9 F (37.2 C)  TempSrc: Oral     Resp: 18 16 15 16   Height:      Weight:      SpO2: 94% 94% 99% 95%     EXAM: symmetric neurologic exam.    Intake/Output Summary (Last 24 hours) at 05/28/13 0741 Last data filed at 05/28/13 0300  Gross per 24 hour  Intake    840 ml  Output    500 ml  Net    340 ml    Lab Results: No results found for this basename: NA, K, CL, CO2, GLUCOSE, BUN, CREATININE, CALCIUM, MG, PHOS,  in the last 72 hours No results found for this basename: AST, ALT, ALKPHOS, BILITOT, PROT, ALBUMIN,  in the last 72 hours No results found for this basename: LIPASE, AMYLASE,  in the last 72 hours  Recent Labs  05/28/13 0705  WBC 15.8*  HGB 15.3  HCT 42.7  MCV 92.0  PLT 301   No results found for this basename: CKTOTAL, CKMB, CKMBINDEX, TROPONINI,  in the last 72 hours BNP No results found for this basename: probnp   No results found for this basename: DDIMER,  in the last 72 hours No results found for this basename: HGBA1C,   in the last 72 hours No results found for this basename: CHOL, HDL, LDLCALC, TRIG, CHOLHDL, LDLDIRECT,  in the last 72 hours No results found for this basename: TSH, T4TOTAL, FREET3, T3FREE, THYROIDAB,  in the last 72 hours No results found for this basename: VITAMINB12, FOLATE, FERRITIN, TIBC, IRON, RETICCTPCT,  in the last 72 hours  Studies/Results: No results found. Medications: Medications administered in the last 24 hours reviewed.  Current Medication List reviewed.    LOS: 8 days   [facility] @ [facility] [phone]) 05/28/2013, 7:41 AM

## 2013-05-28 NOTE — Anesthesia Postprocedure Evaluation (Signed)
  Anesthesia Post-op Note  Patient: Zachary [name]  Procedure(s) Performed: Procedure(s): ENDARTERECTOMY CAROTID (Right)  Patient Location: PACU  Anesthesia Type:General  Level of Consciousness: awake, oriented, sedated and patient cooperative  Airway and Oxygen Therapy: Patient Spontanous Breathing  Post-op Pain: mild  Post-op Assessment: Post-op Vital signs reviewed, Patient's Cardiovascular Status Stable, Respiratory Function Stable, Patent Airway, No signs of Nausea or vomiting and Pain level controlled  Post-op Vital Signs: stable  Complications: No apparent anesthesia complications

## 2013-05-29 DIAGNOSIS — R339 Retention of urine, unspecified: Secondary | ICD-10-CM | POA: Diagnosis not present

## 2013-05-29 DIAGNOSIS — G8190 Hemiplegia, unspecified affecting unspecified side: Secondary | ICD-10-CM

## 2013-05-29 DIAGNOSIS — I6350 Cerebral infarction due to unspecified occlusion or stenosis of unspecified cerebral artery: Secondary | ICD-10-CM

## 2013-05-29 LAB — BASIC METABOLIC PANEL
BUN: 9 mg/dL (ref 6–23)
CO2: 24 meq/L (ref 19–32)
Calcium: 8.2 mg/dL — ABNORMAL LOW (ref 8.4–10.5)
Chloride: 104 mEq/L (ref 96–112)
Creatinine, Ser: 0.85 mg/dL (ref 0.50–1.35)
GFR calc Af Amer: 87 mL/min — ABNORMAL LOW (ref >90)
GFR calc non Af Amer: 75 mL/min — ABNORMAL LOW (ref >90)
GLUCOSE: 150 mg/dL — AB (ref 70–99)
POTASSIUM: 4.2 meq/L (ref 3.7–5.3)
SODIUM: 140 meq/L (ref 137–147)

## 2013-05-29 LAB — GLUCOSE, CAPILLARY
GLUCOSE-CAPILLARY: 126 mg/dL — AB (ref 70–99)
GLUCOSE-CAPILLARY: 162 mg/dL — AB (ref 70–99)
Glucose-Capillary: 112 mg/dL — ABNORMAL HIGH (ref 70–99)
Glucose-Capillary: 132 mg/dL — ABNORMAL HIGH (ref 70–99)

## 2013-05-29 LAB — CBC
HCT: 39.2 % (ref 39.0–52.0)
HEMOGLOBIN: 13.8 g/dL (ref 13.0–17.0)
MCH: 32.5 pg (ref 26.0–34.0)
MCHC: 35.2 g/dL (ref 30.0–36.0)
MCV: 92.5 fL (ref 78.0–100.0)
Platelets: 289 10*3/uL (ref 150–400)
RBC: 4.24 MIL/uL (ref 4.22–5.81)
RDW: 13.1 % (ref 11.5–15.5)
WBC: 16.6 10*3/uL — AB (ref 4.0–10.5)

## 2013-05-29 LAB — MYASTHENIA GRAVIS PANEL 2: Acetylcholine Modulat Ab: <1 %

## 2013-05-29 NOTE — Progress Notes (Signed)
Subjective: Interval History: none. Comfortable. Only has complained of mild sore throat. Did have a difficulty voiding which is a chronic problem. It had in and out cath twice last night. Patient is on Flomax chronically.   Objective: Vital signs in last 24 hours: Temp:  [97.4 F (36.3 C)-99.9 F (37.7 C)] 97.7 F (36.5 C) (02/10 0402) Pulse Rate:  [64-104] 104 (02/10 0402) Resp:  [17-23] 18 (02/10 0402) BP: (112-186)/(48-90) 140/76 mmHg (02/10 0402) SpO2:  [89 %-100 %] 98 % (02/10 0402) Arterial Line BP: (136-193)/(44-64) 172/59 mmHg (02/10 0402) Weight:  [205 lb 7.5 oz (93.2 kg)] 205 lb 7.5 oz (93.2 kg) (02/09 1500)  Intake/Output from previous day: 02/09 0701 - 02/10 0700 In: 2905 [P.O.:360; I.V.:2145; IV Piggyback:400] Out: 4300 [Urine:4250; Blood:50] Intake/Output this shift:    Neuro intact. Right neck incision without hematoma.  Lab Results:  Recent Labs  05/28/13 0705 05/29/13 0409  WBC 15.8* 16.6*  HGB 15.3 13.8  HCT 42.7 39.2  PLT 301 289   BMET  Recent Labs  05/28/13 0705 05/29/13 0409  NA 144 140  K 4.3 4.2  CL 105 104  CO2 27 24  GLUCOSE 127* 150*  BUN 17 9  CREATININE 1.06 0.85  CALCIUM 9.0 8.2*    Studies/Results: Ct Angio Head W/cm &/or Wo Cm  05/23/2013   CLINICAL DATA:  80 year old male with small right hemisphere acute infarct. Weakness. Abnormal appearance of the distal right vertebral artery, right ICA siphon and right ICA terminus. Initial encounter.  EXAM: CT ANGIOGRAPHY HEAD AND NECK  TECHNIQUE: Multidetector CT imaging of the head and neck was performed using the standard protocol during bolus administration of intravenous contrast. Multiplanar CT image reconstructions including MIPs were obtained to evaluate the vascular anatomy. Carotid stenosis measurements (when applicable) are obtained utilizing NASCET criteria, using the distal internal carotid diameter as the denominator.  CONTRAST:  75mL OMNIPAQUE IOHEXOL 350 MG/ML SOLN   COMPARISON:  MRI and intracranial MRA 12/09/2013.  MRA 05/01/2012.  FINDINGS: CTA HEAD FINDINGS  Stable cerebral volume. Stable gray-white matter differentiation throughout the brain. . Stable hypodensity at the superior right para rolandic cortex likely corresponding to the recent diffusion finding (series 2, image 25). No acute intracranial hemorrhage identified. No ventriculomegaly. No midline shift, mass effect, or evidence of intracranial mass lesion. No abnormal enhancement identified. No acute osseous abnormality identified. Visualized scalp soft tissues are within normal limits.  VASCULAR FINDINGS:  Major intracranial venous structures appear to be enhancing.  The distal left vertebral artery is patent with calcified plaque resulting in mild to moderate stenosis. The left PICA origin is patent. The vertebrobasilar junction is patent.  The reconstituted distal right vertebral artery remains patent intracranially, but is diminutive. The right PICA origin is patent. The right vertebral is diminutive or stenotic be on the origin of PICA.  The basilar artery is patent without stenosis. SCA and right PCA origins are normal. Fetal type left PCA origin. Right posterior communicating artery diminutive or absent. Bilateral PCA branches within normal limits.  The left ICA siphon is patent but is moderately stenosed in the petrous segment (series 6, image 128) by what appears to be soft plaque (see also coronal image 97). This appearance is stable from the recent MRA. Beyond this level, the left ICA siphon is patent without hemodynamically significant stenosis despite extensive calcified plaque. The left ICA terminus is patent. The left ophthalmic and posterior communicating artery origins are within normal limits. Left MCA and ACA origins are normal.  Anterior communicating artery is diminutive or absent. Asymmetrically decreased enhancement of the right ACA, which remains patent. Normal left ACA.  The right ICA siphon  is patent but demonstrates extensive calcified plaque in the cavernous segment. This results in moderate stenosis in the cavernous segment, stable from the recent MRA. Asymmetric decreased distal right ICA enhancement. Ophthalmic artery origin within normal limits. Right ICA terminus remains patent. Right MCA and ACA origins are patent.  Mildly decreased enhancement of the proximal right ACA in conjunction with the decreased right ICA enhancement. Asymmetrically decreased enhancement of the right MCA, but no major MCA branch occlusion identified. The left MCA branches are normal.  Review of the MIP images confirms the above findings.  CTA NECK FINDINGS  Upper lungs demonstrate emphysema and dependent opacity most compatible with atelectasis. Mediastinal lipomatosis. Negative thyroid, larynx, pharynx, parapharyngeal spaces, retropharyngeal space, sublingual space, submandibular glands and parotid glands. Negative orbits soft tissues. Minor paranasal sinus mucosal thickening. No cervical lymphadenopathy. Cervical spine and left TMJ degeneration. Advanced cervical disc and endplate degeneration. At least mild degenerative spinal stenosis at C5-C6. No acute osseous abnormality identified.  VASCULAR FINDINGS:  Visible central pulmonary arteries are patent. Moderate calcified plaque throughout the aortic arch. Great vessel origins are patent.  No right CCA origin stenosis. Soft and calcified plaque throughout the right CCA, resulting an up to 50 % stenosis with respect to the distal vessel at the level of the thyroid. The right carotid remains patent to the bifurcation. Abundant soft and calcified plaque in the right ICA origin and bulb resulting an very high-grade stenosis and a radiographic string sign over a distance of 1 cm. See series 6, images 81- 91. The cervical right ICA remains patent despite this finding but demonstrates additional soft plaque distally. Just proximal to the skullbase there is subsequent  stenosis of up to 60-65 % with respect to the distal vessel.  Bulky calcified plaque at the right subclavian artery origin resulting in high-grade stenosis (up to 75 % with respect to the distal vessel). Tortuous proximal right vertebral artery which is severely stenotic or occluded along a short segment of the distal right V1 (coronal image 148). The right vertebral artery is reconstituted at the C6 level, but is diminutive and poorly enhancing throughout the neck and to the skullbase. The vessel is again reconstituted from muscular branches just proximal to the skullbase (series 6, image 113).  Calcified plaque at the left CCA origin results in less than 50 % stenosis with respect to the distal vessel. Soft and calcified plaque throughout the left CCA. Left carotid bifurcation remains widely patent despite plaque. At the distal bulb there is bulky plaque resulting in stenosis of up to 60-65 % with respect to the distal vessel. Beyond this level, the cervical left ICA is tortuous but otherwise negative.  No proximal left subclavian artery stenosis despite plaque. Moderate stenosis at the left vertebral artery origin related to bulky calcified plaque (series 6, image 43). However, the left vertebral artery remains patent. There is a high-grade stenosis due to soft plaque at the left C6 transverse foramen level (series 6, image 68). Beyond this, the left vertebral artery is patent without additional stenosis to the skullbase.  Review of the MIP images confirms the above findings.  IMPRESSION: 1. Widespread severe atherosclerosis with multifocal significant arterial stenoses. 2. With regard to the carotids and anterior circulation: - 50% stenosis right CCA. - very high-grade stenosis with radiographic string sign at the right ICA origin over a  distance of 1 cm. The right ICA remains patent despite this finding, but is hypo enhancing compared to the left side. - left ICA distal bulb stenosis up to 60 -65%. - extensive  bilateral ICA siphon calcified plaque, resulting in bilateral siphon moderate stenosis. - hypoenhancing right ICA terminus, right ACA and right MCA, but no major intracranial branch occlusion. 3. With regard to the vertebral arteries and posterior circulation: - 75% right subclavian artery origin stenosis. 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 -reconstituted distal right vertebral artery from muscular branches just below the skullbase. Right PICA is patent, the right vertebral be on the PICA is diminutive/stenotic. - moderate left vertebral artery origin stenosis. Severe stenosis of the proximal left V2 segment related to soft plaque at the C6 level. 4. Stable CT appearance of the brain. Salient findings by telephone with NP SHARON BIBY on 05/23/2013 at 13:26 .   Electronically Signed   By: Lars Pinks M.D.   On: 05/23/2013 13:27   Dg Chest 2 View  05/20/2013   CLINICAL DATA:  Generalized weakness.  Shortness of breath.  EXAM: CHEST  2 VIEW  COMPARISON:  PA and lateral chest 05/15/2013.  FINDINGS: Lungs are clear. Heart size is normal. No pneumothorax or pleural effusion.  IMPRESSION: No acute disease.   Electronically Signed   By: Inge Rise M.D.   On: 05/20/2013 19:27   Dg Chest 2 View  05/15/2013   CLINICAL DATA:  Shortness of breath, fatigue  EXAM: CHEST  2 VIEW  COMPARISON:  DG CHEST 1V PORT dated 05/08/2012  FINDINGS: Low lung volumes. There is blunting of the left costophrenic angle. No focal regions consolidation nor focal infiltrates. Cardiac silhouette moderately enlarged. Aorta is tortuous. Atherosclerotic calcifications identified within the aorta. The osseous structures are unremarkable.  IMPRESSION: Blunting of the left costophrenic angle consistent with a trace effusion. Otherwise no evidence of acute cardiopulmonary disease.   Electronically Signed   By: Margaree Mackintosh M.D.   On: 05/15/2013 15:08   Ct Head Wo Contrast  05/20/2013    CLINICAL DATA:  Weakness worsening over 3 days. Cough, constipation and body aches.  EXAM: CT HEAD WITHOUT CONTRAST  TECHNIQUE: Contiguous axial images were obtained from the base of the skull through the vertex without intravenous contrast.  COMPARISON:  MRI of the brain May 01, 2012  FINDINGS: Moderate ventriculomegaly, out of proportion of the degree of sulcal enlargement which is mildly effaced at the convexities. No intraparenchymal hemorrhage, mass effect nor midline shift. Confluent supratentorial white matter hypodensities are within normal range for patient's age and though non-specific suggest sequelae of chronic small vessel ischemic disease. No acute large vascular territory infarcts. Remote right basal ganglia globus pallidus lacunar infarct.  No abnormal extra-axial fluid collections. Basal cisterns are patent. Moderate calcific atherosclerosis of the carotid siphons and included vertebral arteries.  No skull fracture. Trace sphenoid ethmoidal mucosal thickening without paranasal sinus air-fluid levels. The mastoid air cells are well aerated. Severe left temporomandibular osteoarthrosis. Soft tissue within the external auditory canals may reflect cerumen. The included ocular globes and orbital contents are non-suspicious. Status post bilateral ocular lens implants.  IMPRESSION: No acute intracranial process.  Mild sulcal effacement of the convexities, disproportionate to the degree of sulcal enlargement can be seen with normal pressure hydrocephalus.  Moderate to severe white matter changes suggest chronic small vessel ischemic disease with remote right basal ganglia lacunar infarct, similar.   Electronically Signed   By:  Courtnay  Bloomer   On: 05/20/2013 23:25   Ct Angio Neck W/cm &/or Wo/cm  05/23/2013   CLINICAL DATA:  80 year old male with small right hemisphere acute infarct. Weakness. Abnormal appearance of the distal right vertebral artery, right ICA siphon and right ICA terminus. Initial  encounter.  EXAM: CT ANGIOGRAPHY HEAD AND NECK  TECHNIQUE: Multidetector CT imaging of the head and neck was performed using the standard protocol during bolus administration of intravenous contrast. Multiplanar CT image reconstructions including MIPs were obtained to evaluate the vascular anatomy. Carotid stenosis measurements (when applicable) are obtained utilizing NASCET criteria, using the distal internal carotid diameter as the denominator.  CONTRAST:  35mL OMNIPAQUE IOHEXOL 350 MG/ML SOLN  COMPARISON:  MRI and intracranial MRA 12/09/2013.  MRA 05/01/2012.  FINDINGS: CTA HEAD FINDINGS  Stable cerebral volume. Stable gray-white matter differentiation throughout the brain. . Stable hypodensity at the superior right para rolandic cortex likely corresponding to the recent diffusion finding (series 2, image 25). No acute intracranial hemorrhage identified. No ventriculomegaly. No midline shift, mass effect, or evidence of intracranial mass lesion. No abnormal enhancement identified. No acute osseous abnormality identified. Visualized scalp soft tissues are within normal limits.  VASCULAR FINDINGS:  Major intracranial venous structures appear to be enhancing.  The distal left vertebral artery is patent with calcified plaque resulting in mild to moderate stenosis. The left PICA origin is patent. The vertebrobasilar junction is patent.  The reconstituted distal right vertebral artery remains patent intracranially, but is diminutive. The right PICA origin is patent. The right vertebral is diminutive or stenotic be on the origin of PICA.  The basilar artery is patent without stenosis. SCA and right PCA origins are normal. Fetal type left PCA origin. Right posterior communicating artery diminutive or absent. Bilateral PCA branches within normal limits.  The left ICA siphon is patent but is moderately stenosed in the petrous segment (series 6, image 128) by what appears to be soft plaque (see also coronal image 97). This  appearance is stable from the recent MRA. Beyond this level, the left ICA siphon is patent without hemodynamically significant stenosis despite extensive calcified plaque. The left ICA terminus is patent. The left ophthalmic and posterior communicating artery origins are within normal limits. Left MCA and ACA origins are normal.  Anterior communicating artery is diminutive or absent. Asymmetrically decreased enhancement of the right ACA, which remains patent. Normal left ACA.  The right ICA siphon is patent but demonstrates extensive calcified plaque in the cavernous segment. This results in moderate stenosis in the cavernous segment, stable from the recent MRA. Asymmetric decreased distal right ICA enhancement. Ophthalmic artery origin within normal limits. Right ICA terminus remains patent. Right MCA and ACA origins are patent.  Mildly decreased enhancement of the proximal right ACA in conjunction with the decreased right ICA enhancement. Asymmetrically decreased enhancement of the right MCA, but no major MCA branch occlusion identified. The left MCA branches are normal.  Review of the MIP images confirms the above findings.  CTA NECK FINDINGS  Upper lungs demonstrate emphysema and dependent opacity most compatible with atelectasis. Mediastinal lipomatosis. Negative thyroid, larynx, pharynx, parapharyngeal spaces, retropharyngeal space, sublingual space, submandibular glands and parotid glands. Negative orbits soft tissues. Minor paranasal sinus mucosal thickening. No cervical lymphadenopathy. Cervical spine and left TMJ degeneration. Advanced cervical disc and endplate degeneration. At least mild degenerative spinal stenosis at C5-C6. No acute osseous abnormality identified.  VASCULAR FINDINGS:  Visible central pulmonary arteries are patent. Moderate calcified plaque throughout the aortic arch. [employer]  vessel origins are patent.  No right CCA origin stenosis. Soft and calcified plaque throughout the right CCA,  resulting an up to 50 % stenosis with respect to the distal vessel at the level of the thyroid. The right carotid remains patent to the bifurcation. Abundant soft and calcified plaque in the right ICA origin and bulb resulting an very high-grade stenosis and a radiographic string sign over a distance of 1 cm. See series 6, images 81- 91. The cervical right ICA remains patent despite this finding but demonstrates additional soft plaque distally. Just proximal to the skullbase there is subsequent stenosis of up to 60-65 % with respect to the distal vessel.  Bulky calcified plaque at the right subclavian artery origin resulting in high-grade stenosis (up to 75 % with respect to the distal vessel). Tortuous proximal right vertebral artery which is severely stenotic or occluded along a short segment of the distal right V1 (coronal image 148). The right vertebral artery is reconstituted at the C6 level, but is diminutive and poorly enhancing throughout the neck and to the skullbase. The vessel is again reconstituted from muscular branches just proximal to the skullbase (series 6, image 113).  Calcified plaque at the left CCA origin results in less than 50 % stenosis with respect to the distal vessel. Soft and calcified plaque throughout the left CCA. Left carotid bifurcation remains widely patent despite plaque. At the distal bulb there is bulky plaque resulting in stenosis of up to 60-65 % with respect to the distal vessel. Beyond this level, the cervical left ICA is tortuous but otherwise negative.  No proximal left subclavian artery stenosis despite plaque. Moderate stenosis at the left vertebral artery origin related to bulky calcified plaque (series 6, image 43). However, the left vertebral artery remains patent. There is a high-grade stenosis due to soft plaque at the left C6 transverse foramen level (series 6, image 68). Beyond this, the left vertebral artery is patent without additional stenosis to the skullbase.   Review of the MIP images confirms the above findings.  IMPRESSION: 1. Widespread severe atherosclerosis with multifocal significant arterial stenoses. 2. With regard to the carotids and anterior circulation: - 50% stenosis right CCA. - very high-grade stenosis with radiographic string sign at the right ICA origin over a distance of 1 cm. The right ICA remains patent despite this finding, but is hypo enhancing compared to the left side. - left ICA distal bulb stenosis up to 60 -65%. - extensive bilateral ICA siphon calcified plaque, resulting in bilateral siphon moderate stenosis. - hypoenhancing right ICA terminus, right ACA and right MCA, but no major intracranial branch occlusion. 3. With regard to the vertebral arteries and posterior circulation: - 75% right subclavian artery origin stenosis. 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 -reconstituted distal right vertebral artery from muscular branches just below the skullbase. Right PICA is patent, the right vertebral be on the PICA is diminutive/stenotic. - moderate left vertebral artery origin stenosis. Severe stenosis of the proximal left V2 segment related to soft plaque at the C6 level. 4. Stable CT appearance of the brain. Salient findings by telephone with NP SHARON BIBY on 05/23/2013 at 13:26 .   Electronically Signed   By: Lars Pinks M.D.   On: 05/23/2013 13:27   Mr Jodene Nam Head Wo Contrast  05/22/2013   CLINICAL DATA:  Weakness.  Rule out stroke.  EXAM: MRI HEAD WITHOUT CONTRAST  MRA HEAD WITHOUT CONTRAST  TECHNIQUE: Multiplanar, multiecho  pulse sequences of the brain and surrounding structures were obtained without intravenous contrast. Angiographic images of the head were obtained using MRA technique without contrast.  COMPARISON:  Head CT 05/20/2013 and brain MRI/ MRA 05/01/2012  FINDINGS: MRI HEAD FINDINGS  Images are mildly to moderately degraded by motion artifact. Incidental note is made of a partially  empty sella. There is a small, acute infarct in the right frontoparietal region near the vertex likely involving the precentral gyrus. Remote lacunar infarcts are identified involving the right lentiform nucleus and right corona radiata. Small, remote cortical infarcts are noted involving the left greater than right parietal lobes. There is moderate cerebral atrophy with enlargement of the ventricles and sylvian fissures and proportionately less sulcal enlargement near the vertex, unchanged from prior MRI.  Foci of T2 hyperintensity within the periventricular white matter and brainstem do not appear significantly changed and are nonspecific but compatible with mild to moderate chronic small vessel ischemic disease. There is no evidence of intracranial hemorrhage, mass, midline shift, or extra-axial fluid collection. Prior bilateral cataract surgery is noted. Paranasal sinuses and mastoid air cells are clear. Moderate pannus is present at C1-2.  MRA HEAD FINDINGS  Images are mildly to moderately degraded by motion artifact. The visualized distal right vertebral artery appears very small in caliber with diminished flow related enhancement and vessel irregularity, more pronounced than on the prior MRI although there is more motion artifact on the current study. The visualized distal left vertebral artery is patent and dominant without evidence of stenosis. PICA origins are patent bilaterally. SCA origins are patent. Basilar artery is patent without stenosis. There is a fetal origin of the left PCA. There is a mild, focal stenosis of the P1 segment of the right PCA, not evident on the prior study.  Internal carotid arteries are patent from skullbase to carotid termini. The intracranial right ICA is smaller in caliber than the left ICA with less flow related enhancement than on the prior study. The right carotid siphon and proximal supraclinoid ICA appear moderately narrowed. The right A1 segment is mildly irregular  without focal stenosis. ACAs are otherwise unremarkable. Diminished flow related enhancement is present in the right MCA near the bifurcation, although the more distal branches appear patent and of relatively normal caliber. Left MCA is unremarkable. No intracranial aneurysm is identified.  IMPRESSION: 1. Small, acute posterior right frontal infarct. 2. Unchanged appearance of cerebral atrophy and chronic small vessel ischemic disease with remote lacunar infarcts as above. 3. No evidence of proximal intracranial arterial occlusion. There is diminished flow related enhancement and caliber of the distal right vertebral artery, intracranial right ICA, and right MCA near the bifurcation, which could reflect worsening intracranial atherosclerotic disease or be at least partly artifactual.   Electronically Signed   By: Sebastian Ache   On: 05/22/2013 08:28   Mr Brain Wo Contrast  05/22/2013   CLINICAL DATA:  Weakness.  Rule out stroke.  EXAM: MRI HEAD WITHOUT CONTRAST  MRA HEAD WITHOUT CONTRAST  TECHNIQUE: Multiplanar, multiecho pulse sequences of the brain and surrounding structures were obtained without intravenous contrast. Angiographic images of the head were obtained using MRA technique without contrast.  COMPARISON:  Head CT 05/20/2013 and brain MRI/ MRA 05/01/2012  FINDINGS: MRI HEAD FINDINGS  Images are mildly to moderately degraded by motion artifact. Incidental note is made of a partially empty sella. There is a small, acute infarct in the right frontoparietal region near the vertex likely involving the precentral gyrus. Remote lacunar infarcts  are identified involving the right lentiform nucleus and right corona radiata. Small, remote cortical infarcts are noted involving the left greater than right parietal lobes. There is moderate cerebral atrophy with enlargement of the ventricles and sylvian fissures and proportionately less sulcal enlargement near the vertex, unchanged from prior MRI.  Foci of T2  hyperintensity within the periventricular white matter and brainstem do not appear significantly changed and are nonspecific but compatible with mild to moderate chronic small vessel ischemic disease. There is no evidence of intracranial hemorrhage, mass, midline shift, or extra-axial fluid collection. Prior bilateral cataract surgery is noted. Paranasal sinuses and mastoid air cells are clear. Moderate pannus is present at C1-2.  MRA HEAD FINDINGS  Images are mildly to moderately degraded by motion artifact. The visualized distal right vertebral artery appears very small in caliber with diminished flow related enhancement and vessel irregularity, more pronounced than on the prior MRI although there is more motion artifact on the current study. The visualized distal left vertebral artery is patent and dominant without evidence of stenosis. PICA origins are patent bilaterally. SCA origins are patent. Basilar artery is patent without stenosis. There is a fetal origin of the left PCA. There is a mild, focal stenosis of the P1 segment of the right PCA, not evident on the prior study.  Internal carotid arteries are patent from skullbase to carotid termini. The intracranial right ICA is smaller in caliber than the left ICA with less flow related enhancement than on the prior study. The right carotid siphon and proximal supraclinoid ICA appear moderately narrowed. The right A1 segment is mildly irregular without focal stenosis. ACAs are otherwise unremarkable. Diminished flow related enhancement is present in the right MCA near the bifurcation, although the more distal branches appear patent and of relatively normal caliber. Left MCA is unremarkable. No intracranial aneurysm is identified.  IMPRESSION: 1. Small, acute posterior right frontal infarct. 2. Unchanged appearance of cerebral atrophy and chronic small vessel ischemic disease with remote lacunar infarcts as above. 3. No evidence of proximal intracranial arterial  occlusion. There is diminished flow related enhancement and caliber of the distal right vertebral artery, intracranial right ICA, and right MCA near the bifurcation, which could reflect worsening intracranial atherosclerotic disease or be at least partly artifactual.   Electronically Signed   By: Logan Bores   On: 05/22/2013 08:28   Anti-infectives: Anti-infectives   Start     Dose/Rate Route Frequency Ordered Stop   05/28/13 1445  vancomycin (VANCOCIN) IVPB 1000 mg/200 mL premix     1,000 mg 200 mL/hr over 60 Minutes Intravenous Every 12 hours 05/28/13 1430 05/29/13 0309      Assessment/Plan: s/p Procedure(s): ENDARTERECTOMY CAROTID (Right) Stable postop day 1 from right carotid endarterectomy. The patient is asking when he can go home. He is the primary caregiver for his wife. I discussed with him that the plan had been for rehabilitation after this hospitalization. He will discuss this with Dr. Maxwell Caul. Will defer plan for a Foley versus continued in no cath to a primary service as well.   LOS: 9 days   Jandi Swiger 05/29/2013, 8:14 AM

## 2013-05-29 NOTE — Progress Notes (Signed)
Occupational Therapy Treatment Patient Details Name: Zachary [name] MRN: [id number] DOB: 08/02/1921 Today's Date: 05/29/2013 Time: 0175-1025 OT Time Calculation (min): 24 min  OT Assessment / Plan / Recommendation  History of present illness Pt s/p R carotid endartectomy 05/28/13. 80 y.o. male admitted to [facility] on 05/20/13 with 1 month h/o progressive weakness.  Patient endorses diffuse aches all over and severe constipation. No BM for 3 days. Patient was brought to ER In ER he finally had a BM and also was noted to be retaining urine. On the monitor patient noted to be in bigemini. Denies any chest pain, fever, no shortness of breath. States he has spoken to his PCP regarding his generalized fatigue and was told that this is age related.   OT comments  Making steady progress, although pt not safe tod/C home. Continue to recommend SNF for rehab. Will follow.  Follow Up Recommendations  SNF;Supervision/Assistance - 24 hour    Barriers to Discharge       Equipment Recommendations  None recommended by OT    Recommendations for Other Services    Frequency Min 2X/week   Progress towards OT Goals Progress towards OT goals: Progressing toward goals  Plan Discharge plan remains appropriate    Precautions / Restrictions Precautions Precautions: Fall Precaution Comments: pt is generally weak and deconditioned.    Pertinent Vitals/Pain no apparent distress     ADL  Grooming: Supervision/safety;Set up;Min guard Where Assessed - Grooming: Supported standing Toilet Transfer: Minimal assistance Toilet Transfer Method: Sit to stand;Other (comment) Toilet Transfer Equipment: Comfort height toilet Toileting - Clothing Manipulation and Hygiene: Supervision/safety;Min guard Where Assessed - Toileting Clothing Manipulation and Hygiene: Sit to stand from 3-in-1 or toilet Equipment Used: Gait belt;Rolling walker Transfers/Ambulation Related to ADLs: unsafe use of RW. Multiple vc to walk"inside"  thewalker. very kyphotic posutre withRW use  Fatigues easily   OT Diagnosis:    OT Problem List:   OT Treatment Interventions:     OT Goals(current goals can now be found in the care plan section) Acute Rehab OT Goals Patient Stated Goal: go to rehab then go home OT Goal Formulation: With patient/family Time For Goal Achievement: 05/29/13 Potential to Achieve Goals: Good ADL Goals Pt Will Perform Grooming: with set-up;with supervision;sitting Pt Will Perform Upper Body Bathing: with min guard assist;sitting;with supervision;with set-up Pt Will Perform Lower Body Bathing: with mod assist;sitting/lateral leans;sit to/from stand Pt Will Perform Upper Body Dressing: with min guard assist;with supervision;with set-up;sitting Pt Will Transfer to Toilet: with min assist;bedside commode;grab bars;ambulating;regular height toilet Pt Will Perform Toileting - Clothing Manipulation and hygiene: with max assist;with mod assist;sitting/lateral leans;sit to/from stand Additional ADL Goal #1: Pt will complete bed mobility with min A - min guard A  to sit EOB in prep for ADLs  Visit Information  Last OT Received On: 05/29/13 Assistance Needed: +1 History of Present Illness: Pt s/p R carotid endartectomy 05/28/13. 80 y.o. male admitted to [facility] on 05/20/13 with 1 month h/o progressive weakness.  Patient endorses diffuse aches all over and severe constipation. No BM for 3 days. Patient was brought to ER In ER he finally had a BM and also was noted to be retaining urine. On the monitor patient noted to be in bigemini. Denies any chest pain, fever, no shortness of breath. States he has spoken to his PCP regarding his generalized fatigue and was told that this is age related.    Subjective Data      Prior Functioning  Cognition  Cognition Arousal/Alertness: Awake/alert Behavior During Therapy: WFL for tasks assessed/performed Overall Cognitive Status: Within Functional Limits for tasks  assessed Memory:  (decreased safety /judgemetn)    Mobility  Bed Mobility Overal bed mobility: Needs Assistance Bed Mobility: Sidelying to Sit Rolling: Supervision Transfers Overall transfer level: Needs assistance Equipment used: Rolling walker (2 wheeled) Transfers: Sit to/from Stand Sit to Stand: Min assist Stand pivot transfers: Min assist General transfer comment: increased time, minA to steady pt, max v/c's for safe hand placement    Exercises      Balance Balance Overall balance assessment: Needs assistance Sitting-balance support: Bilateral upper extremity supported;Feet supported Sitting balance-Leahy Scale: Fair Standing balance support: Bilateral upper extremity supported;During functional activity Standing balance-Leahy Scale: Poor Standing balance comment: unaware of leaning L at snk  End of Session OT - End of Session Equipment Utilized During Treatment: Gait belt;Rolling walker Activity Tolerance: Patient tolerated treatment well Patient left: in chair;with call bell/phone within reach;with chair alarm set Nurse Communication: Mobility status;Precautions  GO     Zachary [name],HILLARY 05/29/2013, 4:57 PM [facility], OTR/L  [phone] 05/29/2013

## 2013-05-29 NOTE — Progress Notes (Signed)
Assessment/Plan: Principal Problem:   Weakness - did well with CEA. Now about ready to go to [facility] but would like to watch urinary pattern one more day. Will transfer to telemetry today. He wondered if he could go directly home without SNF-rehab but I think it is very important that he go to SNF-rehab given his background weakness over the last several years.   Active Problems:   Hypertension - BP controlled.    Pulsus bigeminis   Diabetes mellitus - sugars are fine.    Acute ischemic stroke   Carotid artery disease   Carotid stenosis, right   Urinary retention - continue I/O cath prn. Remains on tamsulosin.    Subjective: Feels okay. Not much pain. Is having trouble voiding and is needing I/O cath every few hours.   Objective:  Vital Signs: Filed Vitals:   05/28/13 2025 05/28/13 2052 05/28/13 2310 05/29/13 0402  BP:   112/48 140/76  Pulse:   67 104  Temp:  99.9 F (37.7 C) 97.9 F (36.6 C) 97.7 F (36.5 C)  TempSrc:  Oral Oral Oral  Resp:   18 18  Height:      Weight:      SpO2: 89%  99% 98%     EXAM: Moves all extremities normally. No sensory loss to light touch.    Intake/Output Summary (Last 24 hours) at 05/29/13 2778 Last data filed at 05/29/13 0500  Gross per 24 hour  Intake   2905 ml  Output   3850 ml  Net   -945 ml    Lab Results:  Recent Labs  05/28/13 0705 05/29/13 0409  NA 144 140  K 4.3 4.2  CL 105 104  CO2 27 24  GLUCOSE 127* 150*  BUN 17 9  CREATININE 1.06 0.85  CALCIUM 9.0 8.2*   No results found for this basename: AST, ALT, ALKPHOS, BILITOT, PROT, ALBUMIN,  in the last 72 hours No results found for this basename: LIPASE, AMYLASE,  in the last 72 hours  Recent Labs  05/28/13 0705 05/29/13 0409  WBC 15.8* 16.6*  HGB 15.3 13.8  HCT 42.7 39.2  MCV 92.0 92.5  PLT 301 289   No results found for this basename: CKTOTAL, CKMB, CKMBINDEX, TROPONINI,  in the last 72 hours BNP No results found for this basename: probnp   No results  found for this basename: DDIMER,  in the last 72 hours No results found for this basename: HGBA1C,  in the last 72 hours No results found for this basename: CHOL, HDL, LDLCALC, TRIG, CHOLHDL, LDLDIRECT,  in the last 72 hours No results found for this basename: TSH, T4TOTAL, FREET3, T3FREE, THYROIDAB,  in the last 72 hours No results found for this basename: VITAMINB12, FOLATE, FERRITIN, TIBC, IRON, RETICCTPCT,  in the last 72 hours  Studies/Results: No results found. Medications: Medications administered in the last 24 hours reviewed.  Current Medication List reviewed.    LOS: 9 days   [facility] Internal Medicine @ [facility] [phone]) 05/29/2013, 6:08 AM

## 2013-05-29 NOTE — Progress Notes (Signed)
Utilization review completed.  

## 2013-05-29 NOTE — Progress Notes (Signed)
Stroke Team Progress Note  HISTORY Zachary [name] is an 80 y.o. male who reports that he has had a slowly progressive weakness that has occurred over the past 2 weeks. It is now to the point that he is no longer going up stairs because he feels unsafe. He feels unsafe to drive as well. Has extensive difficulty getting up from the toilet. Has generalized achiness. Reports tingling in his fingertips that has been present for about a week. Also reports that he has been constipated for about a week which is unusual for him. He has not had any bladder issues. When he walks feels as if his legs are bucking on him and he feels off balance. He does not describe vertigo. Patient was not administerd TPA secondary to delay in arrival. He was admitted for further evaluation and treatment.  SUBJECTIVE Patient up in chair at bedside. "I think surgery went well, but have developed a little bit of a cough."  OBJECTIVE Most recent Vital Signs: Filed Vitals:   05/28/13 2025 05/28/13 2052 05/28/13 2310 05/29/13 0402  BP:   112/48 140/76  Pulse:   67 104  Temp:  99.9 F (37.7 C) 97.9 F (36.6 C) 97.7 F (36.5 C)  TempSrc:  Oral Oral Oral  Resp:   18 18  Height:      Weight:      SpO2: 89%  99% 98%   CBG (last 3)   Recent Labs  05/28/13 1202 05/28/13 1857 05/28/13 2136  GLUCAP 150* 184* 138*    IV Fluid Intake:   . sodium chloride 100 mL/hr at 05/28/13 2318  . DOPamine      MEDICATIONS  . aspirin  300 mg Rectal Daily   Or  . aspirin  325 mg Oral Daily  . docusate sodium  100 mg Oral BID  . insulin aspart  0-9 Units Subcutaneous TID WC & HS  . metoprolol tartrate  12.5 mg Oral BID  . pantoprazole  40 mg Oral Daily  . pentoxifylline  400 mg Oral TID WC  . senna-docusate  1 tablet Oral QHS  . simvastatin  20 mg Oral q1800  . tamsulosin  0.4 mg Oral Daily   PRN:  acetaminophen, acetaminophen, alum & mag hydroxide-simeth, guaifenesin, hydrALAZINE, labetalol, magnesium sulfate 1 - 4 g bolus  IVPB, morphine injection, ondansetron, oxyCODONE-acetaminophen, phenol, polyethylene glycol, potassium chloride, temazepam  Diet:  Carb Control thin liquids Activity:  OOB with assistance DVT Prophylaxis:  scds ordered  CLINICALLY SIGNIFICANT STUDIES Basic Metabolic Panel:   Recent Labs Lab 05/28/13 0705 05/29/13 0409  NA 144 140  K 4.3 4.2  CL 105 104  CO2 27 24  GLUCOSE 127* 150*  BUN 17 9  CREATININE 1.06 0.85  CALCIUM 9.0 8.2*   Liver Function Tests:  No results found for this basename: AST, ALT, ALKPHOS, BILITOT, PROT, ALBUMIN,  in the last 168 hours CBC:   Recent Labs Lab 05/28/13 0705 05/29/13 0409  WBC 15.8* 16.6*  HGB 15.3 13.8  HCT 42.7 39.2  MCV 92.0 92.5  PLT 301 289   Coagulation:  No results found for this basename: LABPROT, INR,  in the last 168 hours Cardiac Enzymes:  No results found for this basename: CKTOTAL, CKMB, CKMBINDEX, TROPONINI,  in the last 168 hours Urinalysis:  No results found for this basename: COLORURINE, APPERANCEUR, LABSPEC, PHURINE, GLUCOSEU, HGBUR, BILIRUBINUR, KETONESUR, PROTEINUR, UROBILINOGEN, NITRITE, LEUKOCYTESUR,  in the last 168 hours Lipid Panel    Component Value Date/Time  CHOL 186 05/21/2013 0323   TRIG 128 05/21/2013 0323   HDL 32* 05/21/2013 0323   CHOLHDL 5.8 05/21/2013 0323   VLDL 26 05/21/2013 0323   LDLCALC 128* 05/21/2013 0323   HgbA1C  Lab Results  Component Value Date   HGBA1C 6.8* 05/21/2013    Urine Drug Screen:   No results found for this basename: labopia,  cocainscrnur,  labbenz,  amphetmu,  thcu,  labbarb    Alcohol Level: No results found for this basename: ETH,  in the last 168 hours   CT of the brain  05/20/2013 No acute intracranial process.  Mild sulcal effacement of the convexities, disproportionate to the degree of sulcal enlargement can be seen with normal pressure hydrocephalus.  Moderate to severe white matter changes suggest chronic small vessel ischemic disease with remote right basal ganglia  lacunar infarct, similar.   CT angio head and neck 05/23/2013 1. Widespread severe atherosclerosis with multifocal significant arterial stenoses. 2. With regard to the carotids and anterior circulation: - 50% stenosis right CCA. - very high-grade stenosis with radiographic string sign at the right ICA origin over a distance of 1 cm. The right ICA remains patent despite this finding, but is hypo enhancing compared to the left side. - left ICA distal bulb stenosis up to 60 -65%. - extensive bilateral ICA siphon calcified plaque, resulting in bilateral siphon moderate stenosis. - hypoenhancing right ICA terminus, right ACA and right MCA, but no major intracranial branch occlusion. 3. With regard to the vertebral arteries and posterior circulation: - 75% right subclavian artery origin stenosis. 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  -reconstituted distal right vertebral artery from muscular branches just below the skullbase. Right PICA is patent, the right vertebral be on the PICA is diminutive/stenotic. - moderate left vertebral artery origin stenosis. Severe stenosis of the proximal left V2 segment related to soft plaque at the C6 level. 4. Stable CT appearance of the brain.  MRI of the brain  05/22/2013   1. Small, acute posterior right frontal infarct. 2. Unchanged appearance of cerebral atrophy and chronic small vessel ischemic disease with remote lacunar infarcts   MRA of the brain  05/22/2013    No evidence of proximal intracranial arterial occlusion. There is diminished flow related enhancement and caliber of the distal right vertebral artery, intracranial right ICA, and right MCA near the bifurcation, which could reflect worsening intracranial atherosclerotic disease or be at least partly artifactual.     2D Echocardiogram  EF 55-60% with no source of embolus.   Carotid Doppler  Right: Greater than 80% internal carotid artery stenosis. Unable to evaluate  the vertebral due to movement. Left : 40% to 59 % ICA stenosis upper end of scale by systolic velocities. Increase may possible be due in part to compensatory flow from the stenosis on the right. Vertebral artery flow could not be evaluated due to movement and snoring  CXR  05/20/2013 No acute disease.  EKG  sinus tachycardia. For complete results please see formal report.   Therapy Recommendations SNF  Physical Exam   Elderly Caucasian male not in distress.Awake alert. Afebrile. Head is nontraumatic. Neck is supple with soft bilateral carotid bruits. Hearing is diminished bilaterallyl. Cardiac exam no murmur or gallop. Lungs are clear to auscultation. Distal pulses are well felt. Neurological Exam ;  Awake  Alert oriented x 3. Diminished attention, registration and recall. Normal speech and language.eye movements full without nystagmus.fundi were not visualized. Vision  acuity and fields appear normal. Hearing is diminished bilaterally. Palatal movements are normal. Face symmetric. Tongue midline. Normal strength, tone, reflexes and coordination. Normal sensation. Gait deferred.  ASSESSMENT Mr. Zachary [name] is a 80 y.o. male presenting with slowly progressive weakness. Imaging confirms a small right posterior frontal infarct with the setting of R ICA stenosis. Infarct felt to be due to large vessel disease. S/p R CEA 05/28/2013 by Dr. [name]. On aspirin 81 mg orally every day and trental prior to admission. Now on aspirin 325 mg orally every day for secondary stroke prevention. Patient looks great post op.   Diabetes, HgbA1c 6.8, goal < 7.0 Hyperlipidemia, LDL 128, on no statin PTA, now on zocor, goal LDL < 100 (< 70 for diabetics) Leukocytosis, wbc   PVD  Prostate cancer  Family hx stroke (father)  Bigeminy & Trigeminy  Bilateral ICA stenosis (R >80%, L 40-59%)  Hospital day # 9  TREATMENT/PLAN  Continue aspirin 325 mg orally every day for secondary stroke prevention.  SNF  planned at discharge  No further stroke workup indicated.  Patient has a 10-15% risk of having another stroke over the next year, the highest risk is within 2 weeks of the most recent stroke/TIA (risk of having a stroke following a stroke or TIA is the same).  Ongoing risk factor control by Primary Care Physician  Stroke Service will sign off. Please call should any needs arise.  Follow up with Dr. Leonie Man, [facility], in 2 months.  Burnetta Sabin, MSN, RN, ANVP-BC, ANP-BC, [facility] Pager: [phone] 05/29/2013 9:08 AM  I have personally obtained a history, examined the patient, evaluated imaging results, and formulated the assessment and plan of care. I agree with the above. Antony Contras, MD

## 2013-05-29 NOTE — Progress Notes (Signed)
Patient being transferred to [facility] per MD order. Report called to Valarie Merino, Therapist, sports. Patient alert and oriented, VSS. Patient wife at bedside. Patient will transfer in wheel chair.

## 2013-05-29 NOTE — Progress Notes (Signed)
Physical Therapy Treatment Patient Details Name: TREVELLE MCGURN MRN: [id number] DOB: [birth year] Today's Date: 05/29/2013 Time: 1448-1856 PT Time Calculation (min): 25 min  PT Assessment / Plan / Recommendation  History of Present Illness Pt s/p R carotid endartectomy 05/28/13. 80 y.o. male admitted to [facility] on 05/20/13 with 1 month h/o progressive weakness.  Patient endorses diffuse aches all over and severe constipation. No BM for 3 days. Patient was brought to ER In ER he finally had a BM and also was noted to be retaining urine. On the monitor patient noted to be in bigemini. Denies any chest pain, fever, no shortness of breath. States he has spoken to his PCP regarding his generalized fatigue and was told that this is age related.   PT Comments   Pt s/p R carotid endartectomy presenting with c/o of cough, soar throat, and inability to have BM. Pt con't to require minA for safe mobility due to significant increased falls risk. Pt con't to be appropriate for SNF services upon d/c to achieve safe mod I function prior to transitioning home alone.   Follow Up Recommendations  SNF     Does the patient have the potential to tolerate intense rehabilitation     Barriers to Discharge        Equipment Recommendations  None recommended by PT    Recommendations for Other Services    Frequency Min 2X/week   Progress towards PT Goals Progress towards PT goals: Progressing toward goals  Plan Current plan remains appropriate;Frequency needs to be updated    Precautions / Restrictions Precautions Precautions: Fall Restrictions Weight Bearing Restrictions: No   Pertinent Vitals/Pain Denies pain    Mobility  Bed Mobility Overal bed mobility: Needs Assistance Bed Mobility: Rolling;Sidelying to Sit Rolling: Min guard (max directional v/c's) Sidelying to sit: Min assist General bed mobility comments: max directional v/c's, increased time, minimal assist for trunk elevation Transfers Overall  transfer level: Needs assistance Equipment used: Rolling walker (2 wheeled) Transfers: Sit to/from Stand Sit to Stand: Min assist General transfer comment: increased time, minA to steady pt, max v/c's for safe hand placement Ambulation/Gait Ambulation/Gait assistance: Min assist Ambulation Distance (Feet): 120 Feet Assistive device: Rolling walker (2 wheeled) Gait Pattern/deviations: Step-through pattern;Shuffle Gait velocity: decreased General Gait Details: max v/c's for safe walker management. pt with strong tendency to stay to L side of walker and occasionally straddle walker. v/c's to stay inside walker and keep walker close. pt with 3 standing rest breaks due to onset of fatigue    Exercises     PT Diagnosis:    PT Problem List:   PT Treatment Interventions:     PT Goals (current goals can now be found in the care plan section)    Visit Information  Last PT Received On: 05/29/13 Assistance Needed: +1 History of Present Illness: Pt s/p R carotid endartectomy 05/28/13. 80 y.o. male admitted to [facility] on 05/20/13 with 1 month h/o progressive weakness.  Patient endorses diffuse aches all over and severe constipation. No BM for 3 days. Patient was brought to ER In ER he finally had a BM and also was noted to be retaining urine. On the monitor patient noted to be in bigemini. Denies any chest pain, fever, no shortness of breath. States he has spoken to his PCP regarding his generalized fatigue and was told that this is age related.    Subjective Data  Subjective: Pt c/o "I need to have a BM, I need my stool softner."  Cognition  Cognition Arousal/Alertness: Awake/alert Behavior During Therapy: WFL for tasks assessed/performed Overall Cognitive Status: Within Functional Limits for tasks assessed    Balance  Balance Overall balance assessment: Needs assistance Sitting-balance support: Bilateral upper extremity supported Sitting balance-Leahy Scale: Poor Sitting balance - Comments: sat  EOB x 2 min, requires use of UEs to maintain balance Standing balance support: Bilateral upper extremity supported Standing balance-Leahy Scale: Poor Standing balance comment: requires use of RW for safe standing General Comments General comments (skin integrity, edema, etc.): pt assisted to [facility] with max directional v/c's for safety. pt urinated but unsuccessful regarding BM. RN notified of pt request for stool softner  End of Session PT - End of Session Equipment Utilized During Treatment: Gait belt Activity Tolerance: Patient limited by fatigue Patient left: in chair;with call bell/phone within reach Nurse Communication: Mobility status   GP     Kingsley Callander 05/29/2013, 8:24 AM  Kittie Plater, PT, DPT Pager #: [phone] Office #: [phone]

## 2013-05-30 ENCOUNTER — Non-Acute Institutional Stay (SKILLED_NURSING_FACILITY): Payer: Medicare Other | Admitting: Internal Medicine

## 2013-05-30 DIAGNOSIS — E119 Type 2 diabetes mellitus without complications: Secondary | ICD-10-CM

## 2013-05-30 DIAGNOSIS — I6521 Occlusion and stenosis of right carotid artery: Secondary | ICD-10-CM

## 2013-05-30 DIAGNOSIS — I6350 Cerebral infarction due to unspecified occlusion or stenosis of unspecified cerebral artery: Secondary | ICD-10-CM | POA: Diagnosis not present

## 2013-05-30 DIAGNOSIS — I498 Other specified cardiac arrhythmias: Secondary | ICD-10-CM | POA: Diagnosis not present

## 2013-05-30 DIAGNOSIS — I6529 Occlusion and stenosis of unspecified carotid artery: Secondary | ICD-10-CM | POA: Diagnosis not present

## 2013-05-30 DIAGNOSIS — I779 Disorder of arteries and arterioles, unspecified: Secondary | ICD-10-CM | POA: Diagnosis not present

## 2013-05-30 DIAGNOSIS — R269 Unspecified abnormalities of gait and mobility: Secondary | ICD-10-CM | POA: Diagnosis not present

## 2013-05-30 DIAGNOSIS — C801 Malignant (primary) neoplasm, unspecified: Secondary | ICD-10-CM | POA: Diagnosis not present

## 2013-05-30 DIAGNOSIS — I63239 Cerebral infarction due to unspecified occlusion or stenosis of unspecified carotid arteries: Secondary | ICD-10-CM | POA: Diagnosis not present

## 2013-05-30 DIAGNOSIS — R531 Weakness: Secondary | ICD-10-CM

## 2013-05-30 DIAGNOSIS — Z7982 Long term (current) use of aspirin: Secondary | ICD-10-CM | POA: Diagnosis not present

## 2013-05-30 DIAGNOSIS — G40909 Epilepsy, unspecified, not intractable, without status epilepticus: Secondary | ICD-10-CM | POA: Diagnosis not present

## 2013-05-30 DIAGNOSIS — R5381 Other malaise: Secondary | ICD-10-CM | POA: Diagnosis not present

## 2013-05-30 DIAGNOSIS — E860 Dehydration: Secondary | ICD-10-CM | POA: Diagnosis not present

## 2013-05-30 DIAGNOSIS — R5383 Other fatigue: Secondary | ICD-10-CM

## 2013-05-30 DIAGNOSIS — R339 Retention of urine, unspecified: Secondary | ICD-10-CM

## 2013-05-30 DIAGNOSIS — I10 Essential (primary) hypertension: Secondary | ICD-10-CM

## 2013-05-30 DIAGNOSIS — Z5189 Encounter for other specified aftercare: Secondary | ICD-10-CM | POA: Diagnosis not present

## 2013-05-30 DIAGNOSIS — I69959 Hemiplegia and hemiparesis following unspecified cerebrovascular disease affecting unspecified side: Secondary | ICD-10-CM | POA: Diagnosis not present

## 2013-05-30 DIAGNOSIS — I639 Cerebral infarction, unspecified: Secondary | ICD-10-CM

## 2013-05-30 DIAGNOSIS — M6281 Muscle weakness (generalized): Secondary | ICD-10-CM | POA: Diagnosis not present

## 2013-05-30 DIAGNOSIS — R488 Other symbolic dysfunctions: Secondary | ICD-10-CM | POA: Diagnosis not present

## 2013-05-30 DIAGNOSIS — F0390 Unspecified dementia without behavioral disturbance: Secondary | ICD-10-CM | POA: Diagnosis not present

## 2013-05-30 DIAGNOSIS — I739 Peripheral vascular disease, unspecified: Secondary | ICD-10-CM

## 2013-05-30 LAB — GLUCOSE, CAPILLARY
Glucose-Capillary: 148 mg/dL — ABNORMAL HIGH (ref 70–99)
Glucose-Capillary: 132 mg/dL — ABNORMAL HIGH (ref 70–99)
Glucose-Capillary: 158 mg/dL — ABNORMAL HIGH (ref 70–99)

## 2013-05-30 MED ORDER — ATORVASTATIN CALCIUM 10 MG PO TABS: 10.0000 mg | ORAL_TABLET | Freq: Every day | ORAL | Status: DC

## 2013-05-30 NOTE — Progress Notes (Signed)
Clinical Social Worker facilitated patient discharge by contacting the patient, patient's wife and facility, [facility].. Patient agreeable to this plan and arranging transport via EMS . CSW will sign off, as social work intervention is no longer needed.  Jeanette Caprice, MSW, [place]

## 2013-05-30 NOTE — Progress Notes (Addendum)
[facility]  Subjective  - Doing well over all.     Objective 130/64 86 98 F (36.7 C) (Oral) 18 92%  Intake/Output Summary (Last 24 hours) at 05/30/13 0738 Last data filed at 05/29/13 2229  Gross per 24 hour  Intake    480 ml  Output    950 ml  Net   -470 ml    Right neck incision clean and dry without hematoma.  Positive ecchymosis. No weakness on all four extremities. No tongue deviation with symmetrical smile.    Assessment/Planning: POD #1 Right carotid Endarterectomy with Dacron patch angioplasty F/U in 2 weeks for incision check with Dr. Tonie Griffith, [facility] 05/30/2013 7:38 AM --  Laboratory Lab Results:  Recent Labs  05/28/13 0705 05/29/13 0409  WBC 15.8* 16.6*  HGB 15.3 13.8  HCT 42.7 39.2  PLT 301 289   BMET  Recent Labs  05/28/13 0705 05/29/13 0409  NA 144 140  K 4.3 4.2  CL 105 104  CO2 27 24  GLUCOSE 127* 150*  BUN 17 9  CREATININE 1.06 0.85  CALCIUM 9.0 8.2*    COAG Lab Results  Component Value Date   INR 0.94 05/20/2013   INR 1.0 12/16/2008   No results found for this basename: PTT      I have examined the patient, reviewed and agree with above.  Thaddeus Evitts, MD 05/30/2013 5:16 PM

## 2013-05-30 NOTE — Progress Notes (Signed)
The Chaplain offered emotional and spiritual support to the patient today. During the brief visit the patient was not physically at his best when the Chaplain was in the room with him initially but his spirit was very calming and positive for the most part and his wife had very positive attitude as well. The patient and his wife were estatic that the Chaplain came by to visit with them for a while and they informed the Chaplain to come back anytime to visit with them.  Chaplain Clista Bernhardt Talah [place]

## 2013-05-30 NOTE — Progress Notes (Signed)
Clinical Social Work Department CLINICAL SOCIAL WORK PLACEMENT NOTE 05/30/2013  Patient:  Zachary [name], Zachary [name]  Account Number:  [id number] Admit date:  05/20/2013  Clinical Social Worker:  Berton Mount, Latanya Presser  Date/time:  05/22/2013 10:30 AM  Clinical Social Work is seeking post-discharge placement for this patient at the following level of care:   [place]   (*CSW will update this form in Epic as items are completed)   05/22/2013  Patient/family provided with [facility] Department of Clinical Social Work's list of facilities offering this level of care within the geographic area requested by the patient (or if unable, by the patient's family).  05/22/2013  Patient/family informed of their freedom to choose among providers that offer the needed level of care, that participate in Medicare, Medicaid or managed care program needed by the patient, have an available bed and are willing to accept the patient.  05/22/2013  Patient/family informed of [facility]' ownership interest in [facility], as well as of the fact that they are under no obligation to receive care at this facility.  PASARR submitted to EDS on  PASARR number received from [facility] on   FL2 transmitted to all facilities in geographic area requested by pt/family on  05/22/2013 FL2 transmitted to all facilities within larger geographic area on   Patient informed that his/her managed care company has contracts with or will negotiate with  certain facilities, including the following:     Patient/family informed of bed offers received:  05/29/2013 Patient chooses bed at [place] Physician recommends and patient chooses bed at    Patient to be transferred to [place] on  05/30/2013 Patient to be transferred to facility by EMS  The following physician request were entered in Epic:   Additional Comments:  Jeanette Caprice, MSW, [place]

## 2013-05-30 NOTE — Discharge Summary (Signed)
Physician Discharge Summary  Zachary [name]  JKK:[id number]  DOB: [birth year]   Admit date: 05/20/2013 Discharge date: 05/30/2013  Admitting Diagnosis: left sided weakness  Discharge Diagnoses:  Active Hospital Problems   Diagnosis Date Noted  . 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 Urinary retention 05/29/2013  . Carotid artery disease 05/28/2013  . Pulsus bigeminis 05/20/2013  . Diabetes mellitus 05/20/2013  . Acute ischemic stroke - right brain; due to left ICA disease 05/20/2013  . Hypertension 07/15/2011    Resolved Hospital Problems   Diagnosis Date Noted Date Resolved  . Dehydration 05/20/2013 05/29/2013    Things to follow up in the outpatient setting: Followup visit with Dr. [name] needs to be scheduled (as well as one below with Dr. Leonie Man).   Hospital Course: Patient was admitted with left sided hemiparesis that improved quickly. This was on the background of a slowly progressive weakness and fatigue. He was ultimately found to have a right brain stroke that was c/w the left hemiparesis. Stroke workup revealed a very tight right ICA lesion. He underwent carotid endaretectomy on 05/28/13 and did very well.   In terms of his generalized weakness and fatigue, he has seen me in the office numerous times over the last few years and no etiology has been found from a medical point of view. He did see Dr. [name] in 03/2012 when I was concerned that he might have Parkinson's disease. Dr. [name] thought that he probably did have akinetic PD but the patient never saw him in followup. The patient, though very slow moving, is the main caregiver of his wife who has a mild to moderate dementia. She has been unwilling to move to assisted living though it would be best for them to do so. They have no real plan should Zachary [name] not be able to take care of her (due to illness or death). I have spoken to both the patient and his wife about this over the  years: she denies there is any problem and he does not know what to do about it. He is discharged to SNF to try to regain as much mobility and function in the next 1-2 weeks so that he can safely return home to care for her.   Discharge Condition: improved.   Consults: Stroke team; vascular surgery  Disposition: SNF-rehab  Discharge Orders   Future Orders Complete By Expires   Diet - low sodium heart healthy  As directed    Increase activity slowly  As directed        Medication List    STOP taking these medications       aspirin EC 81 MG tablet  Replaced by:  aspirin 325 MG tablet      TAKE these medications       aspirin 325 MG tablet  Take 1 tablet (325 mg total) by mouth daily.     atorvastatin 10 MG tablet  Commonly known as:  LIPITOR  Take 1 tablet (10 mg total) by mouth daily.     ergocalciferol 50000 UNITS capsule  Commonly known as:  VITAMIN D2  Take 50,000 Units by mouth once a week. Friday     flurazepam 30 MG capsule  Commonly known as:  DALMANE  Take 30 mg by mouth at bedtime as needed. sleep     glimepiride 1 MG tablet  Commonly known as:  AMARYL  Take 1 mg by mouth daily before breakfast.     metoprolol tartrate 25 MG  tablet  Commonly known as:  LOPRESSOR  Take 0.5 tablets (12.5 mg total) by mouth 2 (two) times daily.     multivitamin with minerals Tabs tablet  Take 1 tablet by mouth daily.     omeprazole 20 MG capsule  Commonly known as:  PRILOSEC  Take 20 mg by mouth daily.     pentoxifylline 400 MG CR tablet  Commonly known as:  TRENTAL  TAKE 1 TABLET BY MOUTH THREE TIMES A DAY WITH MEALS     tamsulosin 0.4 MG Caps capsule  Commonly known as:  FLOMAX  Take 0.4 mg by mouth daily.           Follow-up Information   Follow up with Forbes Cellar, MD. Schedule an appointment as soon as possible for a visit today. (stroke clinic)    Specialties:  Neurology, Radiology   Contact information:   [address] [place] [address] [phone]       Time coordinating discharge: 25 minutes including medication reconciliation, preparation of discharge papers, and discussion with patient     Signed: Horton Finer 05/30/2013, 6:26 AM

## 2013-05-31 ENCOUNTER — Other Ambulatory Visit: Payer: Self-pay | Admitting: *Deleted

## 2013-05-31 ENCOUNTER — Encounter (HOSPITAL_COMMUNITY): Payer: Self-pay | Admitting: Vascular Surgery

## 2013-05-31 MED ORDER — FLURAZEPAM HCL 30 MG PO CAPS: 30.0000 mg | ORAL_CAPSULE | Freq: Every evening | ORAL | Status: DC | PRN

## 2013-05-31 NOTE — Telephone Encounter (Signed)
[facility] 

## 2013-06-04 ENCOUNTER — Telehealth: Payer: Self-pay | Admitting: Vascular Surgery

## 2013-06-04 ENCOUNTER — Encounter: Payer: Self-pay | Admitting: Internal Medicine

## 2013-06-04 DIAGNOSIS — I639 Cerebral infarction, unspecified: Secondary | ICD-10-CM | POA: Insufficient documentation

## 2013-06-04 NOTE — Assessment & Plan Note (Signed)
Pt has done well post op

## 2013-06-04 NOTE — Progress Notes (Signed)
MRN: [id number] Name: Zachary [name]  Sex: male Age: 80 y.o. DOB: 1921-10-10  [place] #: Helene Kelp Facility/Room: 215A Level Of Care: SNF Provider: Inocencio Homes D Emergency Contacts: Extended Emergency Contact Information Primary Emergency Contact: Dionne Milo Address: [address]          [place] [address] [name] of [place] Phone: [phone] Relation: Spouse Secondary Emergency Contact: Caropreso,Edward  [place] Mobile Phone: [phone] Relation: None  Code Status: FULL  Allergies: Penicillins  Chief Complaint  Patient presents with  . nursing home admission    HPI: Patient is 80 y.o. male who is admitted to SNF to help increase function and moblity sp CVA and akinetic parkinsons.  Past Medical History  Diagnosis Date  . Prostate cancer 2000  . PVD (peripheral vascular disease)   . Prostate enlargement   . Insomnia   . Pancreatitis   . PVC's (premature ventricular contractions)   . Heart block   . Diabetes mellitus without complication     Past Surgical History  Procedure Laterality Date  . Cholecystectomy  07/18/2011    Procedure: LAPAROSCOPIC CHOLECYSTECTOMY;  Surgeon: Gwenyth Ober, MD;  Location: [facility];  Service: General;  Laterality: N/A;  . Prostatectomy    . Endarterectomy Right 05/28/2013    Procedure: ENDARTERECTOMY CAROTID;  Surgeon: Rosetta Posner, MD;  Location: [facility];  Service: Vascular;  Laterality: Right;      Medication List    Notice   This visit is during an admission. Changes to the med list made in this visit will be reflected in the After Visit Summary of the admission.     Current Outpatient Prescriptions on File Prior to Visit  Medication Sig Dispense Refill  . aspirin 325 MG tablet Take 1 tablet (325 mg total) by mouth daily.      Marland Kitchen atorvastatin (LIPITOR) 10 MG tablet Take 1 tablet (10 mg total) by mouth daily.      . ergocalciferol (VITAMIN D2) 50000 UNITS capsule Take 50,000 Units by mouth  once a week. Friday      . glimepiride (AMARYL) 1 MG tablet Take 1 mg by mouth daily before breakfast.      . metoprolol tartrate (LOPRESSOR) 25 MG tablet Take 0.5 tablets (12.5 mg total) by mouth 2 (two) times daily.      . Multiple Vitamin (MULTIVITAMIN WITH MINERALS) TABS Take 1 tablet by mouth daily.      Marland Kitchen omeprazole (PRILOSEC) 20 MG capsule Take 20 mg by mouth daily.      . pentoxifylline (TRENTAL) 400 MG CR tablet TAKE 1 TABLET BY MOUTH THREE TIMES A DAY WITH MEALS  90 tablet  11  . Tamsulosin HCl (FLOMAX) 0.4 MG CAPS Take 0.4 mg by mouth daily.        No current facility-administered medications on file prior to visit.     No orders of the defined types were placed in this encounter.     There is no immunization history on file for this patient.  History  Substance Use Topics  . Smoking status: Former Smoker -- 20 years  . Smokeless tobacco: Not on file  . Alcohol Use: No    Family history is noncontributory    Review of Systems  DATA OBTAINED: from patient; no c/o GENERAL: Feels well no fevers, fatigue, appetite changes SKIN: No itching, rash or wounds EYES: No eye pain, redness, discharge EARS: No earache, tinnitus, change in hearing NOSE: No congestion, drainage or bleeding  MOUTH/THROAT: No mouth or tooth pain, No sore throat, No difficulty chewing or swallowing  RESPIRATORY: No cough, wheezing, SOB CARDIAC: No chest pain, palpitations, lower extremity edema  GI: No abdominal pain, No N/V/D or constipation, No heartburn or reflux  GU: No dysuria, frequency or urgency, or incontinence  MUSCULOSKELETAL: No unrelieved bone/joint pain NEUROLOGIC: No headache, dizziness or focal weakness PSYCHIATRIC: No overt anxiety or sadness. Sleeps well. No behavior issue.   Filed Vitals:   06/04/13 1753  BP: 132/68  Pulse: 72  Temp: 98.7 F (37.1 C)  Resp: 20    Physical Exam  GENERAL APPEARANCE: Alert, conversant. Appropriately groomed. No acute distress.  SKIN: No  diaphoresis rash HEAD: Normocephalic, atraumatic  EYES: Conjunctiva/lids clear. Pupils round, reactive. EOMs intact.  EARS: External exam WNL, canals clear. Hearing grossly normal.  NOSE: No deformity or discharge.  MOUTH/THROAT: Lips w/o lesions  RESPIRATORY: Breathing is even, unlabored. Lung sounds are clear   CARDIOVASCULAR: Heart RRR no murmurs, rubs or gallops. No peripheral edema.   GASTROINTESTINAL: Abdomen is soft, non-tender, not distended w/ normal bowel sounds GENITOURINARY: Bladder non tender, not distended  MUSCULOSKELETAL: No abnormal joints or musculature NEUROLOGIC: Oriented X3. Cranial nerves 2-12 grossly intact PSYCHIATRIC: Mood and affect appropriate to situation, no behavioral issues  Patient Active Problem List   Diagnosis Date Noted  . CVA (cerebral vascular accident) 06/04/2013  . Urinary retention 05/29/2013  . Carotid artery disease 05/28/2013  . Carotid stenosis, right 05/28/2013  . Pulsus bigeminis 05/20/2013  . Diabetes mellitus 05/20/2013  . Weakness 05/20/2013  . Acute ischemic stroke 05/20/2013  . Postop check 08/06/2011  . Acute cholecystitis 07/19/2011  . Hypertension 07/15/2011  . PVD (peripheral vascular disease)     CBC    Component Value Date/Time   WBC 16.6* 05/29/2013 0409   RBC 4.24 05/29/2013 0409   HGB 13.8 05/29/2013 0409   HCT 39.2 05/29/2013 0409   PLT 289 05/29/2013 0409   MCV 92.5 05/29/2013 0409   LYMPHSABS 3.1 05/21/2013 0323   MONOABS 1.9* 05/21/2013 0323   EOSABS 0.3 05/21/2013 0323   BASOSABS 0.0 05/21/2013 0323    CMP     Component Value Date/Time   NA 140 05/29/2013 0409   K 4.2 05/29/2013 0409   CL 104 05/29/2013 0409   CO2 24 05/29/2013 0409   GLUCOSE 150* 05/29/2013 0409   BUN 9 05/29/2013 0409   CREATININE 0.85 05/29/2013 0409   CALCIUM 8.2* 05/29/2013 0409   PROT 7.2 05/20/2013 1850   ALBUMIN 3.7 05/20/2013 1850   AST 31 05/20/2013 1850   ALT 15 05/20/2013 1850   ALKPHOS 54 05/20/2013 1850   BILITOT 0.6 05/20/2013 1850   GFRNONAA  75* 05/29/2013 0409   GFRAA 87* 05/29/2013 0409    Assessment and Plan  CVA (cerebral vascular accident) Presented with L side weakness that was improvIng;found to have R side stroke and tight R ICA lesion.  Carotid stenosis, right Pt has done well post op  Weakness And fatigue, generalized for several years felt by neuro to be akinetic parkinsons but pt did not follow up with neuro.Pt is caretaker of demented wife who will not move to assisted living. Pt is admitted to SNF to try to regain as much mobility and function as possible in next few weeks.  Diabetes mellitus HbA1c 6.8 on amaryl-good control.LDL was 128, goal<70, but assume Lipitor was started in respnse to elevated LDL; recheck fasting lipids 6 weeks  Hypertension Controlled on metoprolol 12.5 mg BID  PVD (peripheral vascular disease) Pt on ASA, statin and trental and DM well controlled  Urinary retention Continue Flomax    Hennie Duos, MD

## 2013-06-04 NOTE — Assessment & Plan Note (Signed)
-   Continue Flomax 

## 2013-06-04 NOTE — Assessment & Plan Note (Signed)
Presented with L side weakness that was improvIng;found to have R side stroke and tight R ICA lesion.

## 2013-06-04 NOTE — Assessment & Plan Note (Signed)
Controlled on metoprolol 12.5 mg BID

## 2013-06-04 NOTE — Telephone Encounter (Addendum)
Message copied by Doristine Section on Mon Jun 04, 2013  2:22 PM ------      Message from: Laverda Page A      Created: Mon Jun 04, 2013 11:00 AM      Regarding: FW: Schedule                   ----- Message -----         From: Mena Goes, CMA         Sent: 05/30/2013   9:41 AM           To: [facility] Admin Pool      Subject: Schedule                                                             ----- Message -----         From: Gabriel Earing, PA-C         Sent: 05/30/2013   7:40 AM           To: [facility] Charge Pool            S/p CEA.  F/u with Dr. [name] in 2 weeks.            Thanks ------  notified deborah at [facility] of fu appt. on 06-19-13 3:45

## 2013-06-04 NOTE — Assessment & Plan Note (Signed)
HbA1c 6.8 on amaryl-good control.LDL was 128, goal<70, but assume Lipitor was started in respnse to elevated LDL; recheck fasting lipids 6 weeks

## 2013-06-04 NOTE — Assessment & Plan Note (Addendum)
Pt on ASA, statin and trental and DM well controlled

## 2013-06-04 NOTE — Assessment & Plan Note (Signed)
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 Pt is admitted to SNF to try to regain as much mobility and function as possible in next few weeks.

## 2013-06-18 ENCOUNTER — Encounter: Payer: Self-pay | Admitting: Vascular Surgery

## 2013-06-19 ENCOUNTER — Non-Acute Institutional Stay (SKILLED_NURSING_FACILITY): Payer: Medicare Other | Admitting: Nurse Practitioner

## 2013-06-19 ENCOUNTER — Ambulatory Visit (INDEPENDENT_AMBULATORY_CARE_PROVIDER_SITE_OTHER): Payer: Self-pay | Admitting: Vascular Surgery

## 2013-06-19 ENCOUNTER — Encounter: Payer: Self-pay | Admitting: Vascular Surgery

## 2013-06-19 VITALS — BP 143/79 | HR 74 | Resp 16 | Ht 69.0 in | Wt 205.0 lb

## 2013-06-19 DIAGNOSIS — I6350 Cerebral infarction due to unspecified occlusion or stenosis of unspecified cerebral artery: Secondary | ICD-10-CM

## 2013-06-19 DIAGNOSIS — I10 Essential (primary) hypertension: Secondary | ICD-10-CM | POA: Diagnosis not present

## 2013-06-19 DIAGNOSIS — R339 Retention of urine, unspecified: Secondary | ICD-10-CM | POA: Diagnosis not present

## 2013-06-19 DIAGNOSIS — R5381 Other malaise: Secondary | ICD-10-CM

## 2013-06-19 DIAGNOSIS — I639 Cerebral infarction, unspecified: Secondary | ICD-10-CM

## 2013-06-19 DIAGNOSIS — Z48812 Encounter for surgical aftercare following surgery on the circulatory system: Secondary | ICD-10-CM | POA: Insufficient documentation

## 2013-06-19 DIAGNOSIS — I6529 Occlusion and stenosis of unspecified carotid artery: Secondary | ICD-10-CM

## 2013-06-19 DIAGNOSIS — E119 Type 2 diabetes mellitus without complications: Secondary | ICD-10-CM | POA: Diagnosis not present

## 2013-06-19 DIAGNOSIS — R531 Weakness: Secondary | ICD-10-CM

## 2013-06-19 DIAGNOSIS — R5383 Other fatigue: Secondary | ICD-10-CM

## 2013-06-19 NOTE — Progress Notes (Signed)
Here today for followup of right carotid endarterectomy for symptomatic carotid disease on 05/28/2013. Complex story with the generalized weakness and then an exacerbation requiring hospitalization. Workup showed MRI with a small right brain stroke and the string sign in his right carotid. I discussed with the patient surgery would not improve his generalized progressive weakness. He underwent uneventful endarterectomy and was initially jerked discharged in nursing facility. He is alert and oriented today. He is quite anxious to be discharged home. He is very concerned regarding his wife who also has multiple medical difficulties.  On physical exam I do not detect any focal weakness. He does have well-healed right neck incision and has no carotid bruits bilaterally  Impression and plan: Stable followup after right carotid endarterectomy for symptomatic carotid disease. We will see him again in 6 months with repeat carotid duplex.

## 2013-06-19 NOTE — Progress Notes (Signed)
Patient ID: Zachary [name], male   DOB: 1921-08-17, 80 y.o.   MRN: [id number]    Nursing Home Location:  [place] of Service: SNF (31)  PCP: Horton Finer, MD  Allergies  Allergen Reactions  . Penicillins Rash    Chief Complaint  Patient presents with  . Medical Managment of Chronic Issues    HPI:  80 year old with pmh of DM, HTN, CAD, CVA who was hospitalized for left sided hemiparesis that improved quickly. This was on the background of a slowly progressive weakness and fatigue. He was ultimately found to have a right brain stroke that was c/w the left hemiparesis. Stroke workup revealed a very tight right ICA lesion. He underwent carotid endaretectomy on 05/28/13 and did very well. Pt is now at [facility] for rehab; pt without complaints other than ongoing weakness and fatigue, staff without concern.  Review of Systems:  Review of Systems  Constitutional: Positive for malaise/fatigue. Negative for fever and chills.  Respiratory: Negative for cough and shortness of breath.   Cardiovascular: Negative for chest pain and leg swelling.  Gastrointestinal: Negative for heartburn, diarrhea and constipation.  Genitourinary: Negative for dysuria and urgency.  Musculoskeletal: Negative for joint pain and myalgias.  Skin: Negative.   Neurological: Positive for weakness. Negative for dizziness and headaches.  Psychiatric/Behavioral: Negative for depression.     Past Medical History  Diagnosis Date  . Prostate cancer 2000  . PVD (peripheral vascular disease)   . Prostate enlargement   . Insomnia   . Pancreatitis   . PVC's (premature ventricular contractions)   . Heart block   . Diabetes mellitus without complication    Past Surgical History  Procedure Laterality Date  . Cholecystectomy  07/18/2011    Procedure: LAPAROSCOPIC CHOLECYSTECTOMY;  Surgeon: Gwenyth Ober, MD;  Location: [facility];  Service: General;  Laterality: N/A;  . Prostatectomy    .  Endarterectomy Right 05/28/2013    Procedure: ENDARTERECTOMY CAROTID;  Surgeon: Rosetta Posner, MD;  Location: [facility];  Service: Vascular;  Laterality: Right;   Social History:   reports that he has quit smoking. He does not have any smokeless tobacco history on file. He reports that he does not drink alcohol or use illicit drugs.  Family History  Problem Relation Age of Onset  . Breast cancer Daughter   . Stroke Father     Medications: Patient's Medications  New Prescriptions   No medications on file  Previous Medications   ASPIRIN 325 MG TABLET    Take 1 tablet (325 mg total) by mouth daily.   ATORVASTATIN (LIPITOR) 10 MG TABLET    Take 1 tablet (10 mg total) by mouth daily.   ERGOCALCIFEROL (VITAMIN D2) 50000 UNITS CAPSULE    Take 50,000 Units by mouth once a week. Friday   FLURAZEPAM (DALMANE) 30 MG CAPSULE    Take 1 capsule (30 mg total) by mouth at bedtime as needed. sleep   GLIMEPIRIDE (AMARYL) 1 MG TABLET    Take 1 mg by mouth daily before breakfast.   METOPROLOL TARTRATE (LOPRESSOR) 25 MG TABLET    Take 0.5 tablets (12.5 mg total) by mouth 2 (two) times daily.   MULTIPLE VITAMIN (MULTIVITAMIN WITH MINERALS) TABS    Take 1 tablet by mouth daily.   OMEPRAZOLE (PRILOSEC) 20 MG CAPSULE    Take 20 mg by mouth daily.   PENTOXIFYLLINE (TRENTAL) 400 MG CR TABLET    TAKE 1 TABLET BY MOUTH THREE  TIMES A DAY WITH MEALS   TAMSULOSIN HCL (FLOMAX) 0.4 MG CAPS    Take 0.4 mg by mouth daily.   Modified Medications   No medications on file  Discontinued Medications   No medications on file     Physical Exam:  Filed Vitals:   06/19/13 1357  BP: 134/70  Pulse: 76  Temp: 97.9 F (36.6 C)  Resp: 20   Physical Exam  Constitutional: He is well-developed, well-nourished, and in no distress.  HENT:  Mouth/Throat: Oropharynx is clear and moist. No oropharyngeal exudate.  Cardiovascular: Normal rate, regular rhythm and normal heart sounds.   Pulmonary/Chest: Effort normal and breath sounds  normal.  Abdominal: Soft. Bowel sounds are normal.  Musculoskeletal: He exhibits no edema and no tenderness.  Neurological: He is alert.  Skin: Skin is warm and dry.      Labs reviewed: Basic Metabolic Panel:  Recent Labs  05/20/13 1850 05/22/13 0245 05/28/13 0705 05/29/13 0409  NA  --  142 144 140  K  --  3.8 4.3 4.2  CL  --  102 105 104  CO2  --  22 27 24   GLUCOSE  --  119* 127* 150*  BUN  --  13 17 9   CREATININE  --  0.96 1.06 0.85  CALCIUM  --  8.8 9.0 8.2*  MG 1.9  --   --   --    Liver Function Tests:  Recent Labs  05/20/13 1850  AST 31  ALT 15  ALKPHOS 54  BILITOT 0.6  PROT 7.2  ALBUMIN 3.7    Recent Labs  05/20/13 1850  LIPASE 25   No results found for this basename: AMMONIA,  in the last 8760 hours CBC:  Recent Labs  05/20/13 0047  05/21/13 0323 05/22/13 0245 05/28/13 0705 05/29/13 0409  WBC  --   < > 18.3* 21.4* 15.8* 16.6*  NEUTROABS 13.5*  --  13.1*  --   --   --   HGB  --   < > 14.9 15.3 15.3 13.8  HCT  --   < > 43.2 43.7 42.7 39.2  MCV  --   < > 91.9 91.8 92.0 92.5  PLT  --   < > 238 255 301 289  < > = values in this interval not displayed. Cardiac Enzymes:  Recent Labs  05/20/13 0047 05/21/13 0323 05/21/13 0620 05/21/13 1210  CKTOTAL 621*  --   --   --   TROPONINI  --  <0.30 <0.30 <0.30   BNP: No components found with this basename: POCBNP,  CBG:  Recent Labs  05/30/13 0606 05/30/13 1109 05/30/13 1614  GLUCAP 148* 132* 158*   TSH:  Recent Labs  05/21/13 0323  TSH 2.258   A1C: Lab Results  Component Value Date   HGBA1C 6.8* 05/21/2013   Lipid Panel:  Recent Labs  05/21/13 0323  CHOL 186  HDL 32*  LDLCALC 128*  TRIG 128  CHOLHDL 5.8      Assessment/Plan 1. CVA (cerebral vascular accident) Presented to hospital with L side weakness that has improved; was found have R side stroke and tight R ICA lesion on hosptialization .  2. Hypertension -Stable and Controlled on metoprolol 12.5 mg BID  3.  Diabetes mellitus Stable; conts amaryl- HbA1c 6.8 on amaryl  4. Urinary retention Without complaints; Continue Flomax  5. Weakness -improved but still with this complaint; conts to work with therapy for strength training   6. PVD (peripheral vascular disease)  Pt on ASA, statin and trental and DM well controlled

## 2013-06-21 NOTE — Addendum Note (Signed)
Addended by: Mena Goes on: 06/21/2013 04:19 PM   Modules accepted: Orders

## 2013-07-04 ENCOUNTER — Non-Acute Institutional Stay (SKILLED_NURSING_FACILITY): Payer: Medicare Other | Admitting: Internal Medicine

## 2013-07-04 ENCOUNTER — Encounter: Payer: Self-pay | Admitting: Internal Medicine

## 2013-07-04 DIAGNOSIS — F0390 Unspecified dementia without behavioral disturbance: Secondary | ICD-10-CM | POA: Diagnosis not present

## 2013-07-04 NOTE — Progress Notes (Signed)
MRN: [id number] Name: Zachary [name]  Sex: male Age: 80 y.o. DOB: Feb 29, 1922  [place] #:  [facility] Facility/Room: 215 Level Of Care: SNF Provider: Inocencio Homes D Emergency Contacts: Extended Emergency Contact Information Primary Emergency Contact: Esa, Raden Address: [address]          [place] [address] [name] of [place] Phone: [phone] Relation: Spouse Secondary Emergency Contact: Caropreso,Edward  [place] Phone: [phone] Relation: None  Code Status: DNR  Allergies: Penicillins  Chief Complaint  Patient presents with  . Acute Visit    HPI: Patient is 80 y.o. male who the nurses have asked me to see to consider putting him on namenda.  Past Medical History  Diagnosis Date  . Prostate cancer 2000  . PVD (peripheral vascular disease)   . Prostate enlargement   . Insomnia   . Pancreatitis   . PVC's (premature ventricular contractions)   . Heart block   . Diabetes mellitus without complication   . Carotid artery occlusion   . Hypertension     Past Surgical History  Procedure Laterality Date  . Cholecystectomy  07/18/2011    Procedure: LAPAROSCOPIC CHOLECYSTECTOMY;  Surgeon: Gwenyth Ober, MD;  Location: [place];  Service: General;  Laterality: N/A;  . Prostatectomy    . Endarterectomy Right 05/28/2013    Procedure: ENDARTERECTOMY CAROTID;  Surgeon: Rosetta Posner, MD;  Location: [place];  Service: Vascular;  Laterality: Right;  . Carotid endarterectomy Right Feb. 9, 2015    cea      Medication List       This list is accurate as of: 07/04/13  2:39 PM.  Always use your most recent med list.               aspirin 325 MG tablet  Take 1 tablet (325 mg total) by mouth daily.     atorvastatin 10 MG tablet  Commonly known as:  LIPITOR  Take 1 tablet (10 mg total) by mouth daily.     DULoxetine 30 MG capsule  Commonly known as:  CYMBALTA     ergocalciferol 50000 UNITS capsule  Commonly known as:  VITAMIN  D2  Take 50,000 Units by mouth once a week. Friday     flurazepam 30 MG capsule  Commonly known as:  DALMANE  Take 1 capsule (30 mg total) by mouth at bedtime as needed. sleep     glimepiride 1 MG tablet  Commonly known as:  AMARYL  Take 1 mg by mouth daily before breakfast.     metoprolol tartrate 25 MG tablet  Commonly known as:  LOPRESSOR  Take 0.5 tablets (12.5 mg total) by mouth 2 (two) times daily.     multivitamin with minerals Tabs tablet  Take 1 tablet by mouth daily.     omeprazole 20 MG capsule  Commonly known as:  PRILOSEC  Take 20 mg by mouth daily.     pentoxifylline 400 MG CR tablet  Commonly known as:  TRENTAL  TAKE 1 TABLET BY MOUTH THREE TIMES A DAY WITH MEALS     tamsulosin 0.4 MG Caps capsule  Commonly known as:  FLOMAX  Take 0.4 mg by mouth daily.     traMADol 50 MG tablet  Commonly known as:  ULTRAM        No orders of the defined types were placed in this encounter.     There is no immunization history on file for this patient.  History  Substance  Use Topics  . Smoking status: Former Smoker -- 20 years    Quit date: 04/19/1962  . Smokeless tobacco: Never Used  . Alcohol Use: No    Review of Systems  DATA OBTAINED: from patient, nurse; per nurses falling more because he is more mobile with aricept GENERAL: Feels well no fevers, fatigue, appetite changes SKIN: No itching, rash HEENT: No complaint RESPIRATORY: No cough, wheezing, SOB CARDIAC: No chest pain, palpitations, lower extremity edema  GI: No abdominal pain, No N/V/D or constipation, No heartburn or reflux  GU: No dysuria, frequency or urgency, or incontinence  MUSCULOSKELETAL: No unrelieved bone/joint pain NEUROLOGIC: No headache, dizziness or focal weakness PSYCHIATRIC: No overt anxiety or sadness. Sleeps well.   Filed Vitals:   07/04/13 1425  BP: 110/68  Pulse: 80  Temp: 98 F (36.7 C)  Resp: 18    Physical Exam  GENERAL APPEARANCE: Alert, conversant.  Appropriately groomed. No acute distress  SKIN: No diaphoresis rash, or wounds HEENT: Unremarkable RESPIRATORY: Breathing is even, unlabored. Lung sounds are clear   CARDIOVASCULAR: Heart RRR no murmurs, rubs or gallops. No peripheral edema  GASTROINTESTINAL: Abdomen is soft, non-tender, not distended w/ normal bowel sounds.  GENITOURINARY: Bladder non tender, not distended  MUSCULOSKELETAL: No abnormal joints or musculature NEUROLOGIC: Cranial nerves 2-12 grossly intact. Moves all extremities no tremor. PSYCHIATRIC: Mood and affect appropriate to situation, no behavioral issues  Patient Active Problem List   Diagnosis Date Noted  . Dementia without behavioral disturbance 07/04/2013  . Occlusion and stenosis of carotid artery without mention of cerebral infarction-Right 06/19/2013  . Aftercare following surgery of the circulatory system, [place] 06/19/2013  . CVA (cerebral vascular accident) 06/04/2013  . Urinary retention 05/29/2013  . Carotid artery disease 05/28/2013  . Carotid stenosis, right 05/28/2013  . Pulsus bigeminis 05/20/2013  . Diabetes mellitus 05/20/2013  . Weakness 05/20/2013  . Acute ischemic stroke 05/20/2013  . Postop check 08/06/2011  . Acute cholecystitis 07/19/2011  . Hypertension 07/15/2011  . PVD (peripheral vascular disease)     CBC    Component Value Date/Time   WBC 16.6* 05/29/2013 0409   RBC 4.24 05/29/2013 0409   HGB 13.8 05/29/2013 0409   HCT 39.2 05/29/2013 0409   PLT 289 05/29/2013 0409   MCV 92.5 05/29/2013 0409   LYMPHSABS 3.1 05/21/2013 0323   MONOABS 1.9* 05/21/2013 0323   EOSABS 0.3 05/21/2013 0323   BASOSABS 0.0 05/21/2013 0323    CMP     Component Value Date/Time   NA 140 05/29/2013 0409   K 4.2 05/29/2013 0409   CL 104 05/29/2013 0409   CO2 24 05/29/2013 0409   GLUCOSE 150* 05/29/2013 0409   BUN 9 05/29/2013 0409   CREATININE 0.85 05/29/2013 0409   CALCIUM 8.2* 05/29/2013 0409   PROT 7.2 05/20/2013 1850   ALBUMIN 3.7 05/20/2013 1850   AST 31  05/20/2013 1850   ALT 15 05/20/2013 1850   ALKPHOS 54 05/20/2013 1850   BILITOT 0.6 05/20/2013 1850   GFRNONAA 75* 05/29/2013 0409   GFRAA 87* 05/29/2013 0409    Assessment and Plan  Dementia without behavioral disturbance Pt was started on aricept and has been doing better mentally, trying new things and falls have increased. Will start pt on namenda as well and see if this will improve the situation.    Hennie Duos, MD

## 2013-07-04 NOTE — Assessment & Plan Note (Signed)
Pt was started on aricept and has been doing better mentally, trying new things and falls have increased. Will start pt on namenda as well and see if this will improve the situation.

## 2013-07-18 ENCOUNTER — Non-Acute Institutional Stay (SKILLED_NURSING_FACILITY): Payer: Medicare Other | Admitting: Nurse Practitioner

## 2013-07-18 DIAGNOSIS — F0390 Unspecified dementia without behavioral disturbance: Secondary | ICD-10-CM

## 2013-07-18 DIAGNOSIS — I10 Essential (primary) hypertension: Secondary | ICD-10-CM | POA: Diagnosis not present

## 2013-07-18 DIAGNOSIS — R339 Retention of urine, unspecified: Secondary | ICD-10-CM

## 2013-07-18 DIAGNOSIS — I779 Disorder of arteries and arterioles, unspecified: Secondary | ICD-10-CM

## 2013-07-18 DIAGNOSIS — I739 Peripheral vascular disease, unspecified: Secondary | ICD-10-CM

## 2013-07-18 DIAGNOSIS — E119 Type 2 diabetes mellitus without complications: Secondary | ICD-10-CM

## 2013-07-18 NOTE — Progress Notes (Signed)
Patient ID: Zachary [name], male   DOB: [birth year], 80 y.o.   MRN: [id number]    Nursing Home Location:  [place] of Service: SNF (31)  PCP: Horton Finer, MD  Allergies  Allergen Reactions  . Penicillins Rash    Chief Complaint  Patient presents with  . Medical Managment of Chronic Issues    HPI:  80 year old with pmh of DM, HTN, CAD, CVA, dementia; pt with ongoing debility and is unsteady with multiple falls while he has been at [facility].  Pt seen today for routine follow up on chronic conditions. Currently pt working with rehab without much improvement on gait and staff having concerns about discharging home alone with wife who is elderly and requiring care for neighbors. Pt does not have any concerns other than wanting to go home.  Review of Systems:  Review of Systems  Constitutional: Positive for malaise/fatigue. Negative for fever and chills.  Respiratory: Negative for cough and shortness of breath.   Cardiovascular: Negative for chest pain and leg swelling.  Gastrointestinal: Positive for constipation (reports constipation however BMs are daily and regular per nursing). Negative for heartburn, abdominal pain and diarrhea.  Genitourinary: Negative for dysuria and urgency.  Musculoskeletal: Negative for joint pain and myalgias.  Skin: Negative.   Neurological: Positive for weakness. Negative for dizziness and headaches.  Psychiatric/Behavioral: Positive for memory loss. Negative for depression. The patient is nervous/anxious.      Past Medical History  Diagnosis Date  . Prostate cancer 2000  . PVD (peripheral vascular disease)   . Prostate enlargement   . Insomnia   . Pancreatitis   . PVC's (premature ventricular contractions)   . Heart block   . Diabetes mellitus without complication   . Carotid artery occlusion   . Hypertension    Past Surgical History  Procedure Laterality Date  . Cholecystectomy  07/18/2011    Procedure:  LAPAROSCOPIC CHOLECYSTECTOMY;  Surgeon: Gwenyth Ober, MD;  Location: [place];  Service: General;  Laterality: N/A;  . Prostatectomy    . Endarterectomy Right 05/28/2013    Procedure: ENDARTERECTOMY CAROTID;  Surgeon: Rosetta Posner, MD;  Location: [place];  Service: Vascular;  Laterality: Right;  . Carotid endarterectomy Right Feb. 9, 2015    cea   Social History:   reports that he quit smoking about 51 years ago. He has never used smokeless tobacco. He reports that he does not drink alcohol or use illicit drugs.  Family History  Problem Relation Age of Onset  . Breast cancer Daughter   . Cancer Daughter   . Stroke Father     Medications: Patient's Medications  New Prescriptions   No medications on file  Previous Medications   ASPIRIN 325 MG TABLET    Take 1 tablet (325 mg total) by mouth daily.   ATORVASTATIN (LIPITOR) 10 MG TABLET    Take 1 tablet (10 mg total) by mouth daily.   DULOXETINE (CYMBALTA) 30 MG CAPSULE       ERGOCALCIFEROL (VITAMIN D2) 50000 UNITS CAPSULE    Take 50,000 Units by mouth once a week. Friday   FLURAZEPAM (DALMANE) 30 MG CAPSULE    Take 1 capsule (30 mg total) by mouth at bedtime as needed. sleep   GLIMEPIRIDE (AMARYL) 1 MG TABLET    Take 1 mg by mouth daily before breakfast.   METOPROLOL TARTRATE (LOPRESSOR) 25 MG TABLET    Take 0.5 tablets (12.5 mg total) by mouth 2 (two)  times daily.   MULTIPLE VITAMIN (MULTIVITAMIN WITH MINERALS) TABS    Take 1 tablet by mouth daily.   OMEPRAZOLE (PRILOSEC) 20 MG CAPSULE    Take 20 mg by mouth daily.   PENTOXIFYLLINE (TRENTAL) 400 MG CR TABLET    TAKE 1 TABLET BY MOUTH THREE TIMES A DAY WITH MEALS   TAMSULOSIN HCL (FLOMAX) 0.4 MG CAPS    Take 0.4 mg by mouth daily.    TRAMADOL (ULTRAM) 50 MG TABLET      Modified Medications   No medications on file  Discontinued Medications   No medications on file     Physical Exam:  Filed Vitals:   07/18/13 1606  BP: 124/66  Pulse: 60  Temp: 97 F (36.1 C)  Resp: 20    Physical Exam  Constitutional: He is well-developed, well-nourished, and in no distress.  HENT:  Mouth/Throat: Oropharynx is clear and moist. No oropharyngeal exudate.  Eyes: Conjunctivae and EOM are normal. Pupils are equal, round, and reactive to light.  Neck: Normal range of motion. Neck supple.  Cardiovascular: Normal rate, regular rhythm and normal heart sounds.   Pulmonary/Chest: Effort normal and breath sounds normal. No respiratory distress.  Abdominal: Soft. Bowel sounds are normal. He exhibits no distension. There is no tenderness.  Musculoskeletal: He exhibits no edema and no tenderness.  Neurological: He is alert.  Skin: Skin is warm and dry.  Psychiatric: He is agitated. He exhibits disordered thought content and abnormal recent memory.  ;   Labs reviewed: Basic Metabolic Panel:  Recent Labs  05/20/13 1850 05/22/13 0245 05/28/13 0705 05/29/13 0409  NA  --  142 144 140  K  --  3.8 4.3 4.2  CL  --  102 105 104  CO2  --  22 27 24   GLUCOSE  --  119* 127* 150*  BUN  --  13 17 9   CREATININE  --  0.96 1.06 0.85  CALCIUM  --  8.8 9.0 8.2*  MG 1.9  --   --   --    Liver Function Tests:  Recent Labs  05/20/13 1850  AST 31  ALT 15  ALKPHOS 54  BILITOT 0.6  PROT 7.2  ALBUMIN 3.7    Recent Labs  05/20/13 1850  LIPASE 25   No results found for this basename: AMMONIA,  in the last 8760 hours CBC:  Recent Labs  05/20/13 0047  05/21/13 0323 05/22/13 0245 05/28/13 0705 05/29/13 0409  WBC  --   < > 18.3* 21.4* 15.8* 16.6*  NEUTROABS 13.5*  --  13.1*  --   --   --   HGB  --   < > 14.9 15.3 15.3 13.8  HCT  --   < > 43.2 43.7 42.7 39.2  MCV  --   < > 91.9 91.8 92.0 92.5  PLT  --   < > 238 255 301 289  < > = values in this interval not displayed. Cardiac Enzymes:  Recent Labs  05/20/13 0047 05/21/13 0323 05/21/13 0620 05/21/13 1210  CKTOTAL 621*  --   --   --   TROPONINI  --  <0.30 <0.30 <0.30   BNP: No components found with this basename:  POCBNP,  CBG:  Recent Labs  05/30/13 0606 05/30/13 1109 05/30/13 1614  GLUCAP 148* 132* 158*   TSH:  Recent Labs  05/21/13 0323  TSH 2.258   A1C: Lab Results  Component Value Date   HGBA1C 6.8* 05/21/2013   Lipid Panel:  Recent Labs  05/21/13 0323  CHOL 186  HDL 32*  LDLCALC 128*  TRIG 128  CHOLHDL 5.8   Lipid Profile    Result: 06/26/2013 3:57 PM   ( Status: F )     C Cholesterol 123     0-200 mg/dL SLN C Triglyceride 122     <150 mg/dL SLN   HDL Cholesterol 31   L >39 mg/dL SLN   Total Chol/HDL Ratio 4.0      Ratio SLN   VLDL Cholesterol (Calc) 24     0-40 mg/dL SLN   LDL Cholesterol (Calc) 68     0-99 mg/dL SLN C Liver Profile    Result: 06/26/2013 3:57 PM   ( Status: F )       Bilirubin, Total 0.6     0.2-1.2 mg/dL SLN   Bilirubin, Direct 0.1     0.0-0.3 mg/dL SLN   Indirect Bilirubin 0.5     0.2-1.2 mg/dL SLN   Alkaline Phosphatase 52     39-117 U/L SLN   AST/SGOT 17     0-37 U/L SLN   ALT/SGPT 14     0-53 U/L SLN   Total Protein 6.4     6.0-8.3 g/dL SLN   Albumin 3.6     3.5-5.2 g/dL SLN   Hemoglobin A1C    Result: 06/26/2013 3:32 PM   ( Status: F )       Hemoglobin A1C 6.4   H <5.7 % SLN C Estimated Average Glucose 137   H <117 mg/dL SLN   Assessment/Plan 1. Hypertension - blood pressure remains stable, will cont current medications   2. Carotid artery disease -remains on asa, lipitor and lopressor  3. Diabetes mellitus -A1c stable; conts on amaryl  4. Dementia without behavioral disturbance -pt has had cognitive decline in the time that he has been at [facility], currently on aricept and namenda to preserve memory but at this point pts cognitive decline significant enough that he is unable to make an informed safe decision about discharge; pts son has been informed and he wants to defer all decisions to his father. called pts son and discussed in detail about pt mental capacity and the fact that due to the pts dementia he was unable to make an  appropriate and safe decision about his discharge home and that his son needs to make a plan for caretakers to come (since son is in [place]) and check on pt to ensure safety and proper care. Son agrees and plans to look into agency that offer sitters or assistance. Also discussed community support.  -conts aricept and namenda   5. Urinary retention -stable; conts on be on flomax   6. VIt D def conts on 50000 units weekly; Will follow up vit d level at this time   1 hour Time TOTAL:  time greater than 50% of total time spent doing pt counseled and coordination of care regarding pts care and pending discharge

## 2013-07-19 ENCOUNTER — Other Ambulatory Visit: Payer: Self-pay | Admitting: *Deleted

## 2013-07-19 MED ORDER — FLURAZEPAM HCL 30 MG PO CAPS: 30.0000 mg | ORAL_CAPSULE | Freq: Every evening | ORAL | Status: DC | PRN

## 2013-07-19 NOTE — Telephone Encounter (Signed)
[facility] 

## 2013-08-10 ENCOUNTER — Non-Acute Institutional Stay (SKILLED_NURSING_FACILITY): Payer: Medicare Other | Admitting: Nurse Practitioner

## 2013-08-10 DIAGNOSIS — F0390 Unspecified dementia without behavioral disturbance: Secondary | ICD-10-CM

## 2013-08-10 DIAGNOSIS — I6350 Cerebral infarction due to unspecified occlusion or stenosis of unspecified cerebral artery: Secondary | ICD-10-CM | POA: Diagnosis not present

## 2013-08-10 DIAGNOSIS — R5383 Other fatigue: Secondary | ICD-10-CM

## 2013-08-10 DIAGNOSIS — I639 Cerebral infarction, unspecified: Secondary | ICD-10-CM

## 2013-08-10 DIAGNOSIS — E119 Type 2 diabetes mellitus without complications: Secondary | ICD-10-CM

## 2013-08-10 DIAGNOSIS — R339 Retention of urine, unspecified: Secondary | ICD-10-CM | POA: Diagnosis not present

## 2013-08-10 DIAGNOSIS — R531 Weakness: Secondary | ICD-10-CM

## 2013-08-10 DIAGNOSIS — R5381 Other malaise: Secondary | ICD-10-CM

## 2013-08-10 DIAGNOSIS — I10 Essential (primary) hypertension: Secondary | ICD-10-CM

## 2013-08-10 NOTE — Progress Notes (Signed)
Patient ID: Zachary [name], male   DOB: [birth year], 80 y.o.   MRN: [id number]    Nursing Home Location:  [place] of Service: SNF (31)  PCP: Horton Finer, MD  Allergies  Allergen Reactions  . Penicillins Rash    Chief Complaint  Patient presents with  . Discharge Note    HPI:  80 year old male who is at [facility] after L side weakness and found to have R side stroke and tight R ICA lesion; He underwent carotid endaretectomy on 05/28/13 and did very well. He was sent to [facility] for short-term rehab. Since he has been at [facility] he has had progressive memory loss without on-going debility due to imbalance issues and with memory loss pt is unable to make proper decisions regarding safety. Pt is ready to return home with wife despite therapies recommendations; son aware of this and they have decided to have an agency provide 24 hour care to the patient to help ensure safety for this pt.   Review of Systems:  Review of Systems  Constitutional: Negative for fever, chills and malaise/fatigue.  Respiratory: Negative for cough and shortness of breath.   Cardiovascular: Negative for chest pain and leg swelling.  Gastrointestinal: Negative for heartburn, diarrhea and constipation.  Genitourinary: Negative for dysuria and urgency.  Musculoskeletal: Negative for joint pain and myalgias.  Skin: Negative.   Neurological: Negative for dizziness, weakness and headaches.  Psychiatric/Behavioral: Positive for memory loss. Negative for depression.     Past Medical History  Diagnosis Date  . Prostate cancer 2000  . PVD (peripheral vascular disease)   . Prostate enlargement   . Insomnia   . Pancreatitis   . PVC's (premature ventricular contractions)   . Heart block   . Diabetes mellitus without complication   . Carotid artery occlusion   . Hypertension    Past Surgical History  Procedure Laterality Date  . Cholecystectomy  07/18/2011    Procedure:  LAPAROSCOPIC CHOLECYSTECTOMY;  Surgeon: Gwenyth Ober, MD;  Location: [place];  Service: General;  Laterality: N/A;  . Prostatectomy    . Endarterectomy Right 05/28/2013    Procedure: ENDARTERECTOMY CAROTID;  Surgeon: Rosetta Posner, MD;  Location: [place];  Service: Vascular;  Laterality: Right;  . Carotid endarterectomy Right Feb. 9, 2015    cea   Social History:   reports that he quit smoking about 51 years ago. He has never used smokeless tobacco. He reports that he does not drink alcohol or use illicit drugs.  Family History  Problem Relation Age of Onset  . Breast cancer Daughter   . Cancer Daughter   . Stroke Father     Medications: Patient's Medications  New Prescriptions   No medications on file  Previous Medications   ASPIRIN 325 MG TABLET    Take 1 tablet (325 mg total) by mouth daily.   ATORVASTATIN (LIPITOR) 10 MG TABLET    Take 1 tablet (10 mg total) by mouth daily.   DONEPEZIL (ARICEPT) 10 MG TABLET    Take 10 mg by mouth at bedtime.   ERGOCALCIFEROL (VITAMIN D2) 50000 UNITS CAPSULE    Take 50,000 Units by mouth once a week. Friday   FLURAZEPAM (DALMANE) 30 MG CAPSULE    Take 1 capsule (30 mg total) by mouth at bedtime as needed. sleep   GLIMEPIRIDE (AMARYL) 1 MG TABLET    Take 1 mg by mouth daily before breakfast.   MEMANTINE HCL ER (NAMENDA XR)  28 MG CP24    Take by mouth.   METOPROLOL TARTRATE (LOPRESSOR) 25 MG TABLET    Take 0.5 tablets (12.5 mg total) by mouth 2 (two) times daily.   MULTIPLE VITAMIN (MULTIVITAMIN WITH MINERALS) TABS    Take 1 tablet by mouth daily.   OMEPRAZOLE (PRILOSEC) 20 MG CAPSULE    Take 20 mg by mouth daily.   PENTOXIFYLLINE (TRENTAL) 400 MG CR TABLET    TAKE 1 TABLET BY MOUTH THREE TIMES A DAY WITH MEALS   TAMSULOSIN HCL (FLOMAX) 0.4 MG CAPS    Take 0.4 mg by mouth daily.   Modified Medications   No medications on file  Discontinued Medications   DULOXETINE (CYMBALTA) 30 MG CAPSULE       TRAMADOL (ULTRAM) 50 MG TABLET         Physical  Exam:  Filed Vitals:   08/10/13 1552  BP: 120/72  Pulse: 89  Temp: 98.7 F (37.1 C)  Resp: 20    Physical Exam  Constitutional: He is well-developed, well-nourished, and in no distress.  HENT:  Mouth/Throat: Oropharynx is clear and moist. No oropharyngeal exudate.  Eyes: Conjunctivae and EOM are normal. Pupils are equal, round, and reactive to light.  Neck: Normal range of motion. Neck supple.  Cardiovascular: Normal rate, regular rhythm and normal heart sounds.   Pulmonary/Chest: Effort normal and breath sounds normal. No respiratory distress.  Abdominal: Soft. Bowel sounds are normal. He exhibits no distension. There is no tenderness.  Musculoskeletal: He exhibits no edema and no tenderness.  Neurological: He is alert. Coordination and gait (uncoordinated gait) abnormal.  Skin: Skin is warm and dry.  Psychiatric: He exhibits disordered thought content and abnormal recent memory.     Labs reviewed: Basic Metabolic Panel:  Recent Labs  05/20/13 1850 05/22/13 0245 05/28/13 0705 05/29/13 0409  NA  --  142 144 140  K  --  3.8 4.3 4.2  CL  --  102 105 104  CO2  --  22 27 24   GLUCOSE  --  119* 127* 150*  BUN  --  13 17 9   CREATININE  --  0.96 1.06 0.85  CALCIUM  --  8.8 9.0 8.2*  MG 1.9  --   --   --    Liver Function Tests:  Recent Labs  05/20/13 1850  AST 31  ALT 15  ALKPHOS 54  BILITOT 0.6  PROT 7.2  ALBUMIN 3.7    Recent Labs  05/20/13 1850  LIPASE 25   No results found for this basename: AMMONIA,  in the last 8760 hours CBC:  Recent Labs  05/20/13 0047  05/21/13 0323 05/22/13 0245 05/28/13 0705 05/29/13 0409  WBC  --   < > 18.3* 21.4* 15.8* 16.6*  NEUTROABS 13.5*  --  13.1*  --   --   --   HGB  --   < > 14.9 15.3 15.3 13.8  HCT  --   < > 43.2 43.7 42.7 39.2  MCV  --   < > 91.9 91.8 92.0 92.5  PLT  --   < > 238 255 301 289  < > = values in this interval not displayed. Cardiac Enzymes:  Recent Labs  05/20/13 0047 05/21/13 0323  05/21/13 0620 05/21/13 1210  CKTOTAL 621*  --   --   --   TROPONINI  --  <0.30 <0.30 <0.30   BNP: No components found with this basename: POCBNP,  CBG:  Recent Labs  05/30/13 0606  05/30/13 1109 05/30/13 1614  GLUCAP 148* 132* 158*   TSH:  Recent Labs  05/21/13 0323  TSH 2.258   A1C: Lab Results  Component Value Date   HGBA1C 6.8* 05/21/2013   Lipid Panel:  Recent Labs  05/21/13 0323  CHOL 186  HDL 32*  LDLCALC 128*  TRIG 128  CHOLHDL 5.8   Lipid Profile  Result: 06/26/2013 3:57 PM ( Status: F ) C  Cholesterol 123 0-200 mg/dL SLN C  Triglyceride 122 <150 mg/dL SLN  HDL Cholesterol 31 L >39 mg/dL SLN  Total Chol/HDL Ratio 4.0 Ratio SLN  VLDL Cholesterol (Calc) 24 0-40 mg/dL SLN  LDL Cholesterol (Calc) 68 0-99 mg/dL SLN C  Liver Profile  Result: 06/26/2013 3:57 PM ( Status: F )  Bilirubin, Total 0.6 0.2-1.2 mg/dL SLN  Bilirubin, Direct 0.1 0.0-0.3 mg/dL SLN  Indirect Bilirubin 0.5 0.2-1.2 mg/dL SLN  Alkaline Phosphatase 52 39-117 U/L SLN  AST/SGOT 17 0-37 U/L SLN  ALT/SGPT 14 0-53 U/L SLN  Total Protein 6.4 6.0-8.3 g/dL SLN  Albumin 3.6 3.5-5.2 g/dL SLN  Hemoglobin A1C  Result: 06/26/2013 3:32 PM ( Status: F )  Hemoglobin A1C 6.4 H <5.7 % SLN C  Estimated Average Glucose 137 H <117 mg/dL SLN   Assessment/Plan 1. Diabetes mellitus -conts on Amaryl   2. Dementia without behavioral disturbance -has been started on aricept and namenda since at [facility] tolerating mediations   3. Urinary retention -conts on flomax without complaints  4. CVA (cerebral vascular accident) -still with abd coordination and balance issues, home health PT/OT recommended on discharge -also needing 24 hour care due to dementia and CVA pt should not drive and needs help with ADL and IADLs  5. Hypertension -conts on metoprolol  6. Weakness Has improved; pt is stable for discharge home with 24 hour care to ensure safety-will need PT/OT/Nursing/HHA/SW per home health. DME  needed includes BSC, WC and walker. Rx written.  will need to follow up with PCP within 2 weeks (appt has been made).

## 2013-08-17 DIAGNOSIS — M545 Low back pain, unspecified: Secondary | ICD-10-CM | POA: Diagnosis not present

## 2013-08-17 DIAGNOSIS — M25569 Pain in unspecified knee: Secondary | ICD-10-CM | POA: Diagnosis not present

## 2013-08-17 DIAGNOSIS — Z79899 Other long term (current) drug therapy: Secondary | ICD-10-CM | POA: Diagnosis not present

## 2013-08-17 DIAGNOSIS — I6529 Occlusion and stenosis of unspecified carotid artery: Secondary | ICD-10-CM | POA: Diagnosis not present

## 2013-08-17 DIAGNOSIS — I69959 Hemiplegia and hemiparesis following unspecified cerebrovascular disease affecting unspecified side: Secondary | ICD-10-CM | POA: Diagnosis not present

## 2013-08-28 DIAGNOSIS — I69959 Hemiplegia and hemiparesis following unspecified cerebrovascular disease affecting unspecified side: Secondary | ICD-10-CM | POA: Diagnosis not present

## 2013-08-28 DIAGNOSIS — E119 Type 2 diabetes mellitus without complications: Secondary | ICD-10-CM | POA: Diagnosis not present

## 2013-08-28 DIAGNOSIS — I10 Essential (primary) hypertension: Secondary | ICD-10-CM | POA: Diagnosis not present

## 2013-08-28 DIAGNOSIS — Z5189 Encounter for other specified aftercare: Secondary | ICD-10-CM

## 2013-08-30 DIAGNOSIS — Z5189 Encounter for other specified aftercare: Secondary | ICD-10-CM | POA: Diagnosis not present

## 2013-08-30 DIAGNOSIS — I69959 Hemiplegia and hemiparesis following unspecified cerebrovascular disease affecting unspecified side: Secondary | ICD-10-CM | POA: Diagnosis not present

## 2013-08-30 DIAGNOSIS — E119 Type 2 diabetes mellitus without complications: Secondary | ICD-10-CM | POA: Diagnosis not present

## 2013-08-30 DIAGNOSIS — I10 Essential (primary) hypertension: Secondary | ICD-10-CM | POA: Diagnosis not present

## 2013-09-04 DIAGNOSIS — Z5189 Encounter for other specified aftercare: Secondary | ICD-10-CM | POA: Diagnosis not present

## 2013-09-04 DIAGNOSIS — I10 Essential (primary) hypertension: Secondary | ICD-10-CM | POA: Diagnosis not present

## 2013-09-04 DIAGNOSIS — E119 Type 2 diabetes mellitus without complications: Secondary | ICD-10-CM | POA: Diagnosis not present

## 2013-09-04 DIAGNOSIS — I69959 Hemiplegia and hemiparesis following unspecified cerebrovascular disease affecting unspecified side: Secondary | ICD-10-CM | POA: Diagnosis not present

## 2013-09-06 DIAGNOSIS — Z5189 Encounter for other specified aftercare: Secondary | ICD-10-CM | POA: Diagnosis not present

## 2013-09-06 DIAGNOSIS — E119 Type 2 diabetes mellitus without complications: Secondary | ICD-10-CM | POA: Diagnosis not present

## 2013-09-06 DIAGNOSIS — I69959 Hemiplegia and hemiparesis following unspecified cerebrovascular disease affecting unspecified side: Secondary | ICD-10-CM | POA: Diagnosis not present

## 2013-09-06 DIAGNOSIS — I10 Essential (primary) hypertension: Secondary | ICD-10-CM | POA: Diagnosis not present

## 2013-09-10 DIAGNOSIS — Z5189 Encounter for other specified aftercare: Secondary | ICD-10-CM | POA: Diagnosis not present

## 2013-09-10 DIAGNOSIS — I69959 Hemiplegia and hemiparesis following unspecified cerebrovascular disease affecting unspecified side: Secondary | ICD-10-CM | POA: Diagnosis not present

## 2013-09-10 DIAGNOSIS — I10 Essential (primary) hypertension: Secondary | ICD-10-CM | POA: Diagnosis not present

## 2013-09-10 DIAGNOSIS — E119 Type 2 diabetes mellitus without complications: Secondary | ICD-10-CM | POA: Diagnosis not present

## 2013-09-14 DIAGNOSIS — Z5189 Encounter for other specified aftercare: Secondary | ICD-10-CM | POA: Diagnosis not present

## 2013-09-14 DIAGNOSIS — I10 Essential (primary) hypertension: Secondary | ICD-10-CM | POA: Diagnosis not present

## 2013-09-14 DIAGNOSIS — I69959 Hemiplegia and hemiparesis following unspecified cerebrovascular disease affecting unspecified side: Secondary | ICD-10-CM | POA: Diagnosis not present

## 2013-09-14 DIAGNOSIS — E119 Type 2 diabetes mellitus without complications: Secondary | ICD-10-CM | POA: Diagnosis not present

## 2013-09-17 DIAGNOSIS — R5383 Other fatigue: Secondary | ICD-10-CM | POA: Diagnosis not present

## 2013-09-17 DIAGNOSIS — R5381 Other malaise: Secondary | ICD-10-CM | POA: Diagnosis not present

## 2013-09-17 DIAGNOSIS — G479 Sleep disorder, unspecified: Secondary | ICD-10-CM | POA: Diagnosis not present

## 2013-09-18 DIAGNOSIS — I69959 Hemiplegia and hemiparesis following unspecified cerebrovascular disease affecting unspecified side: Secondary | ICD-10-CM | POA: Diagnosis not present

## 2013-09-18 DIAGNOSIS — I10 Essential (primary) hypertension: Secondary | ICD-10-CM | POA: Diagnosis not present

## 2013-09-18 DIAGNOSIS — Z5189 Encounter for other specified aftercare: Secondary | ICD-10-CM | POA: Diagnosis not present

## 2013-09-18 DIAGNOSIS — E119 Type 2 diabetes mellitus without complications: Secondary | ICD-10-CM | POA: Diagnosis not present

## 2013-09-19 ENCOUNTER — Other Ambulatory Visit: Payer: Self-pay | Admitting: Nurse Practitioner

## 2013-09-19 DIAGNOSIS — E119 Type 2 diabetes mellitus without complications: Secondary | ICD-10-CM | POA: Diagnosis not present

## 2013-09-19 DIAGNOSIS — Z5189 Encounter for other specified aftercare: Secondary | ICD-10-CM | POA: Diagnosis not present

## 2013-09-19 DIAGNOSIS — I69959 Hemiplegia and hemiparesis following unspecified cerebrovascular disease affecting unspecified side: Secondary | ICD-10-CM | POA: Diagnosis not present

## 2013-09-19 DIAGNOSIS — I10 Essential (primary) hypertension: Secondary | ICD-10-CM | POA: Diagnosis not present

## 2013-09-20 DIAGNOSIS — I69959 Hemiplegia and hemiparesis following unspecified cerebrovascular disease affecting unspecified side: Secondary | ICD-10-CM | POA: Diagnosis not present

## 2013-09-20 DIAGNOSIS — E119 Type 2 diabetes mellitus without complications: Secondary | ICD-10-CM | POA: Diagnosis not present

## 2013-09-20 DIAGNOSIS — I10 Essential (primary) hypertension: Secondary | ICD-10-CM | POA: Diagnosis not present

## 2013-09-20 DIAGNOSIS — Z5189 Encounter for other specified aftercare: Secondary | ICD-10-CM | POA: Diagnosis not present

## 2013-09-24 DIAGNOSIS — Z5189 Encounter for other specified aftercare: Secondary | ICD-10-CM | POA: Diagnosis not present

## 2013-09-24 DIAGNOSIS — I10 Essential (primary) hypertension: Secondary | ICD-10-CM | POA: Diagnosis not present

## 2013-09-24 DIAGNOSIS — E119 Type 2 diabetes mellitus without complications: Secondary | ICD-10-CM | POA: Diagnosis not present

## 2013-09-24 DIAGNOSIS — I69959 Hemiplegia and hemiparesis following unspecified cerebrovascular disease affecting unspecified side: Secondary | ICD-10-CM | POA: Diagnosis not present

## 2013-09-26 DIAGNOSIS — I10 Essential (primary) hypertension: Secondary | ICD-10-CM | POA: Diagnosis not present

## 2013-09-26 DIAGNOSIS — I69959 Hemiplegia and hemiparesis following unspecified cerebrovascular disease affecting unspecified side: Secondary | ICD-10-CM | POA: Diagnosis not present

## 2013-09-26 DIAGNOSIS — Z5189 Encounter for other specified aftercare: Secondary | ICD-10-CM | POA: Diagnosis not present

## 2013-09-26 DIAGNOSIS — E119 Type 2 diabetes mellitus without complications: Secondary | ICD-10-CM | POA: Diagnosis not present

## 2013-09-28 DIAGNOSIS — I69959 Hemiplegia and hemiparesis following unspecified cerebrovascular disease affecting unspecified side: Secondary | ICD-10-CM | POA: Diagnosis not present

## 2013-09-28 DIAGNOSIS — I10 Essential (primary) hypertension: Secondary | ICD-10-CM | POA: Diagnosis not present

## 2013-09-28 DIAGNOSIS — Z5189 Encounter for other specified aftercare: Secondary | ICD-10-CM | POA: Diagnosis not present

## 2013-09-28 DIAGNOSIS — E119 Type 2 diabetes mellitus without complications: Secondary | ICD-10-CM | POA: Diagnosis not present

## 2013-10-01 DIAGNOSIS — I69959 Hemiplegia and hemiparesis following unspecified cerebrovascular disease affecting unspecified side: Secondary | ICD-10-CM | POA: Diagnosis not present

## 2013-10-01 DIAGNOSIS — E119 Type 2 diabetes mellitus without complications: Secondary | ICD-10-CM | POA: Diagnosis not present

## 2013-10-01 DIAGNOSIS — Z5189 Encounter for other specified aftercare: Secondary | ICD-10-CM | POA: Diagnosis not present

## 2013-10-01 DIAGNOSIS — I10 Essential (primary) hypertension: Secondary | ICD-10-CM | POA: Diagnosis not present

## 2013-10-02 DIAGNOSIS — I10 Essential (primary) hypertension: Secondary | ICD-10-CM | POA: Diagnosis not present

## 2013-10-02 DIAGNOSIS — I69959 Hemiplegia and hemiparesis following unspecified cerebrovascular disease affecting unspecified side: Secondary | ICD-10-CM | POA: Diagnosis not present

## 2013-10-02 DIAGNOSIS — E119 Type 2 diabetes mellitus without complications: Secondary | ICD-10-CM | POA: Diagnosis not present

## 2013-10-02 DIAGNOSIS — Z5189 Encounter for other specified aftercare: Secondary | ICD-10-CM | POA: Diagnosis not present

## 2013-10-04 DIAGNOSIS — I69959 Hemiplegia and hemiparesis following unspecified cerebrovascular disease affecting unspecified side: Secondary | ICD-10-CM | POA: Diagnosis not present

## 2013-10-04 DIAGNOSIS — Z5189 Encounter for other specified aftercare: Secondary | ICD-10-CM | POA: Diagnosis not present

## 2013-10-04 DIAGNOSIS — I10 Essential (primary) hypertension: Secondary | ICD-10-CM | POA: Diagnosis not present

## 2013-10-04 DIAGNOSIS — E119 Type 2 diabetes mellitus without complications: Secondary | ICD-10-CM | POA: Diagnosis not present

## 2013-10-08 DIAGNOSIS — I69959 Hemiplegia and hemiparesis following unspecified cerebrovascular disease affecting unspecified side: Secondary | ICD-10-CM | POA: Diagnosis not present

## 2013-10-08 DIAGNOSIS — E119 Type 2 diabetes mellitus without complications: Secondary | ICD-10-CM | POA: Diagnosis not present

## 2013-10-08 DIAGNOSIS — I10 Essential (primary) hypertension: Secondary | ICD-10-CM | POA: Diagnosis not present

## 2013-10-08 DIAGNOSIS — Z5189 Encounter for other specified aftercare: Secondary | ICD-10-CM | POA: Diagnosis not present

## 2013-10-10 DIAGNOSIS — I69959 Hemiplegia and hemiparesis following unspecified cerebrovascular disease affecting unspecified side: Secondary | ICD-10-CM | POA: Diagnosis not present

## 2013-10-10 DIAGNOSIS — I10 Essential (primary) hypertension: Secondary | ICD-10-CM | POA: Diagnosis not present

## 2013-10-10 DIAGNOSIS — E119 Type 2 diabetes mellitus without complications: Secondary | ICD-10-CM | POA: Diagnosis not present

## 2013-10-10 DIAGNOSIS — Z5189 Encounter for other specified aftercare: Secondary | ICD-10-CM | POA: Diagnosis not present

## 2013-10-12 DIAGNOSIS — E119 Type 2 diabetes mellitus without complications: Secondary | ICD-10-CM | POA: Diagnosis not present

## 2013-10-12 DIAGNOSIS — I69959 Hemiplegia and hemiparesis following unspecified cerebrovascular disease affecting unspecified side: Secondary | ICD-10-CM | POA: Diagnosis not present

## 2013-10-12 DIAGNOSIS — I10 Essential (primary) hypertension: Secondary | ICD-10-CM | POA: Diagnosis not present

## 2013-10-12 DIAGNOSIS — Z5189 Encounter for other specified aftercare: Secondary | ICD-10-CM | POA: Diagnosis not present

## 2013-10-17 DIAGNOSIS — E119 Type 2 diabetes mellitus without complications: Secondary | ICD-10-CM | POA: Diagnosis not present

## 2013-10-17 DIAGNOSIS — I69959 Hemiplegia and hemiparesis following unspecified cerebrovascular disease affecting unspecified side: Secondary | ICD-10-CM | POA: Diagnosis not present

## 2013-10-17 DIAGNOSIS — Z5189 Encounter for other specified aftercare: Secondary | ICD-10-CM | POA: Diagnosis not present

## 2013-10-17 DIAGNOSIS — I10 Essential (primary) hypertension: Secondary | ICD-10-CM | POA: Diagnosis not present

## 2013-10-18 DIAGNOSIS — E119 Type 2 diabetes mellitus without complications: Secondary | ICD-10-CM | POA: Diagnosis not present

## 2013-10-18 DIAGNOSIS — Z5189 Encounter for other specified aftercare: Secondary | ICD-10-CM | POA: Diagnosis not present

## 2013-10-18 DIAGNOSIS — I10 Essential (primary) hypertension: Secondary | ICD-10-CM | POA: Diagnosis not present

## 2013-10-18 DIAGNOSIS — I69959 Hemiplegia and hemiparesis following unspecified cerebrovascular disease affecting unspecified side: Secondary | ICD-10-CM | POA: Diagnosis not present

## 2013-10-22 DIAGNOSIS — I10 Essential (primary) hypertension: Secondary | ICD-10-CM | POA: Diagnosis not present

## 2013-10-22 DIAGNOSIS — E119 Type 2 diabetes mellitus without complications: Secondary | ICD-10-CM | POA: Diagnosis not present

## 2013-10-22 DIAGNOSIS — I69959 Hemiplegia and hemiparesis following unspecified cerebrovascular disease affecting unspecified side: Secondary | ICD-10-CM | POA: Diagnosis not present

## 2013-10-22 DIAGNOSIS — Z5189 Encounter for other specified aftercare: Secondary | ICD-10-CM | POA: Diagnosis not present

## 2013-11-28 DIAGNOSIS — R5383 Other fatigue: Secondary | ICD-10-CM | POA: Diagnosis not present

## 2013-11-28 DIAGNOSIS — G20 Parkinson's disease: Secondary | ICD-10-CM | POA: Diagnosis not present

## 2013-11-28 DIAGNOSIS — R5381 Other malaise: Secondary | ICD-10-CM | POA: Diagnosis not present

## 2013-12-21 ENCOUNTER — Encounter: Payer: Self-pay | Admitting: Vascular Surgery

## 2013-12-25 ENCOUNTER — Ambulatory Visit (HOSPITAL_COMMUNITY)
Admission: RE | Admit: 2013-12-25 | Discharge: 2013-12-25 | Disposition: A | Discharge status: Home / Self Care | Payer: Medicare Other | Source: Ambulatory Visit | Attending: Vascular Surgery | Admitting: Vascular Surgery

## 2013-12-25 ENCOUNTER — Encounter: Payer: Self-pay | Admitting: Vascular Surgery

## 2013-12-25 ENCOUNTER — Ambulatory Visit (INDEPENDENT_AMBULATORY_CARE_PROVIDER_SITE_OTHER): Payer: Medicare Other | Admitting: Vascular Surgery

## 2013-12-25 VITALS — BP 167/100 | HR 50 | Temp 98.1°F | Resp 16 | Ht 70.0 in | Wt 203.0 lb

## 2013-12-25 DIAGNOSIS — I6529 Occlusion and stenosis of unspecified carotid artery: Secondary | ICD-10-CM | POA: Diagnosis not present

## 2013-12-25 DIAGNOSIS — I771 Stricture of artery: Secondary | ICD-10-CM | POA: Diagnosis not present

## 2013-12-25 DIAGNOSIS — Z48812 Encounter for surgical aftercare following surgery on the circulatory system: Secondary | ICD-10-CM

## 2013-12-25 DIAGNOSIS — I6509 Occlusion and stenosis of unspecified vertebral artery: Secondary | ICD-10-CM | POA: Insufficient documentation

## 2013-12-25 NOTE — Progress Notes (Signed)
HISTORY AND PHYSICAL     CC:  Follow up carotid duplex scan  Referring Provider:  Horton Finer,*  HPI: This is a 80 y.o. male who has known carotid stenosis is here for f/u carotid duplex scan.  Denies amaurosis fugax, paresthesias, or hemiparesis.  He states that he was in the hospital back in April of this year and was told that he had had a stroke.  He continues to have generalized weakness and otherwise, has done well.  He does have some blurry vision bilaterally.  He had cataract removal and lens implant ~ 1 year ago.    He is on a statin for his cholesterol.  He is on a beta blocker for his hypertension.  He does take a daily ASA.    Past Medical History  Diagnosis Date  . Prostate cancer 2000  . PVD (peripheral vascular disease)   . Prostate enlargement   . Insomnia   . Pancreatitis   . PVC's (premature ventricular contractions)   . Heart block   . Diabetes mellitus without complication   . Carotid artery occlusion   . Hypertension    Past Surgical History  Procedure Laterality Date  . Cholecystectomy  07/18/2011    Procedure: LAPAROSCOPIC CHOLECYSTECTOMY;  Surgeon: Gwenyth Ober, MD;  Location: [place];  Service: General;  Laterality: N/A;  . Prostatectomy    . Endarterectomy Right 05/28/2013    Procedure: ENDARTERECTOMY CAROTID;  Surgeon: Rosetta Posner, MD;  Location: [facility] OR;  Service: Vascular;  Laterality: Right;  . Carotid endarterectomy Right Feb. 9, 2015    cea    Allergies  Allergen Reactions  . Penicillins Rash    Current Outpatient Prescriptions  Medication Sig Dispense Refill  . aspirin 325 MG tablet Take 1 tablet (325 mg total) by mouth daily.      Marland Kitchen atorvastatin (LIPITOR) 10 MG tablet Take 1 tablet (10 mg total) by mouth daily.      Marland Kitchen donepezil (ARICEPT) 10 MG tablet Take 10 mg by mouth at bedtime.      . ergocalciferol (VITAMIN D2) 50000 UNITS capsule Take 50,000 Units by mouth once a week. Friday      . flurazepam (DALMANE) 30 MG capsule Take 1  capsule (30 mg total) by mouth at bedtime as needed. sleep  30 capsule  0  . glimepiride (AMARYL) 1 MG tablet Take 1 mg by mouth daily before breakfast.      . Memantine HCl ER (NAMENDA XR) 28 MG CP24 Take by mouth.      . metoprolol tartrate (LOPRESSOR) 25 MG tablet Take 0.5 tablets (12.5 mg total) by mouth 2 (two) times daily.      . Multiple Vitamin (MULTIVITAMIN WITH MINERALS) TABS Take 1 tablet by mouth daily.      Marland Kitchen omeprazole (PRILOSEC) 20 MG capsule Take 20 mg by mouth daily.      . pentoxifylline (TRENTAL) 400 MG CR tablet TAKE 1 TABLET BY MOUTH THREE TIMES A DAY WITH MEALS  90 tablet  11  . Tamsulosin HCl (FLOMAX) 0.4 MG CAPS Take 0.4 mg by mouth daily.        No current facility-administered medications for this visit.    Pt's meds include: Statin:  Yes.   Beta Blocker:  Yes.   Aspirin:  Yes.   Other antiplatelets/anticoagulants:  No.   Family History  Problem Relation Age of Onset  . Breast cancer Daughter   . Cancer Daughter   . Stroke Father  History   Social History  . Marital Status: Married    Spouse Name: N/A    Number of Children: N/A  . Years of Education: N/A   Occupational History  . Not on file.   Social History Main Topics  . Smoking status: Former Smoker -- 20 years    Quit date: 04/19/1962  . Smokeless tobacco: Never Used  . Alcohol Use: No  . Drug Use: No  . Sexual Activity: Not on file   Other Topics Concern  . Not on file   Social History Narrative  . No narrative on file     ROS: [x]  Positive   [ ]  Negative   [ ]  All sytems reviewed and are negative  Cardiovascular: []  chest pain/pressure []  palpitations []  SOB lying flat []  DOE []  pain in legs while walking []  pain in feet when lying flat []  hx of DVT []  hx of phlebitis []  swelling in legs []  varicose veins  Pulmonary: []  productive cough []  asthma []  wheezing  Neurologic: [x]  weakness in [x]  arms [x]  legs []  numbness in []  arms []  legs [] difficulty speaking  or slurred speech []  temporary loss of vision in one eye []  dizziness  Hematologic: []  bleeding problems []  problems with blood clotting easily  GI []  vomiting blood []  blood in stool  GU: []  burning with urination []  blood in urine  Psychiatric: []  hx of major depression  Integumentary: []  rashes []  ulcers  Constitutional: []  fever []  chills   PHYSICAL EXAMINATION:  Filed Vitals:   12/25/13 1412  BP: 167/100  Pulse: 50  Temp:   Resp:    Body mass index is 29.13 kg/(m^2).  General:  WDWN in NAD Gait: Slow, walks with a cane HENT: WNL; normocephalic Eyes: PERRL Pulmonary: normal non-labored breathing , without Rales, rhonchi,  wheezing Cardiac: RRR, without  Murmurs, rubs or gallops; without carotid bruits Abdomen: soft, NT, no masses Skin: without rashes,  ulcers  Vascular Exam/Pulses:   Right Left  Radial 2+ (normal) 2+ (normal)  Ulnar Unable to palpate Unable to palpate   Popliteal Unable to palpate  Unable to palpate   DP 2+ palpable Unable to palpate   PT Unable to palpate  Unable to palpate    Extremities: without ischemic changes, without Gangrene , without cellulitis; without open wounds;  Musculoskeletal: without muscle wasting or atrophy  Neurologic: A&O X 3; Appropriate Affect ; SENSATION: normal; MOTOR FUNCTION:  moving all extremities equally. Speech is fluent/normal   Non-Invasive Vascular Imaging: Carotid Duplex Scan:  12/25/2013  1.  Widely patent right CEA without evidence of restenosis or hyperplasia 2.  Evidence of <40% left ICA stenosis 3.  Right vertebral artery appears occluded in the proximal segment with reconstitution distally 4.  Significant stenosis of the right subclavian artery 5.  Left vertebral artery is stenotic in the mid cervical segment  ASSESSMENT: 80 y.o. male here for f/u carotid duplex scan s/p right CEA 05/25/13.   PLAN: -pt is doing well s/p right CEA.  He denies any stroke symptoms and presents with  overall generalized weakness -he does have palpable bilateral radial pulses as he does have a significant stenosis of the right subclavian artery. -will have him f/u in one year with carotid duplex -he will f/u with his PCP about his generalized weakness -he is going to f/u with is ophthalmologist for his bilateral blurry vision as he is s/p lens implants after cataract removal 1 year ago.     Leontine Locket,  PA-C [facility] [phone]  Clinic MD:   Pt seen and examined in conjunction with Dr. [name]  I have examined the patient, reviewed and agree with above.  Emerson Schreifels, MD 12/25/2013 5:28 PM

## 2014-01-02 DIAGNOSIS — Z23 Encounter for immunization: Secondary | ICD-10-CM | POA: Diagnosis not present

## 2014-01-07 ENCOUNTER — Ambulatory Visit (INDEPENDENT_AMBULATORY_CARE_PROVIDER_SITE_OTHER): Payer: Medicare Other | Admitting: Neurology

## 2014-01-07 ENCOUNTER — Encounter: Payer: Self-pay | Admitting: Neurology

## 2014-01-07 VITALS — BP 150/74 | HR 80 | Ht 70.0 in | Wt 206.0 lb

## 2014-01-07 DIAGNOSIS — G4710 Hypersomnia, unspecified: Secondary | ICD-10-CM

## 2014-01-07 DIAGNOSIS — G4719 Other hypersomnia: Secondary | ICD-10-CM

## 2014-01-07 DIAGNOSIS — I6529 Occlusion and stenosis of unspecified carotid artery: Secondary | ICD-10-CM

## 2014-01-07 DIAGNOSIS — G609 Hereditary and idiopathic neuropathy, unspecified: Secondary | ICD-10-CM | POA: Diagnosis not present

## 2014-01-07 DIAGNOSIS — E119 Type 2 diabetes mellitus without complications: Secondary | ICD-10-CM | POA: Diagnosis not present

## 2014-01-07 LAB — HEMOGLOBIN A1C
Hgb A1c MFr Bld: 6.5 % — ABNORMAL HIGH (ref <5.7)
Mean Plasma Glucose: 140 mg/dL — ABNORMAL HIGH (ref <117)

## 2014-01-07 NOTE — Patient Instructions (Addendum)
1. Your provider has requested that you have labwork completed today. Please go to [facility] on the first floor of this building before leaving the office today. 2. We have scheduled with Dr Kathrin Penner with [facility] on 01/25/2014 at 2:15 pm. If this is not a good date/time you can call their office to reschedule at [phone]. 3. We will call you with appt for Sleep Study with Dr Nehemiah Settle. You can contact the sleep center directly at [phone].

## 2014-01-07 NOTE — Progress Notes (Signed)
[name] was seen today in the movement disorders clinic for neurologic consultation at the request of Horton Finer, MD.    The patient is seen today in the movement disorder clinic for consultation regarding possible Parkinson's disease.  The patient is accompanied by his wife who supplements the history.  The patient was apparently seen several years ago by Dr. [name] for the same.  I do not have those records.  He apparently just a one-time visit and was to get an MRI and returned for followup and discussion about medications.  He did not return for followup.  Studies reviewed and available: 05/22/2013 MRI of the brain: Acute posterior right frontal infarction, moderate small vessel disease, and moderate atrophy 05/21/2013 echocardiogram: Left ventricular ejection fraction 55-60% 05/21/2013 carotid ultrasound: 80-99% stenosis of the right internal carotid artery, 40-59% stenosis of the left internal carotid artery. 05/28/2013: Patient underwent right carotid artery endarterectomy 12/25/2013 carotid ultrasound: Widely patent right internal carotid artery, less than 40% stenosis in the left internal carotid artery; with occluded right vertebral proximally with reconstitution distally; stenotic left vertebral in the mid cervical region; significant stenosis of the right subclavian artery.  Pt reports his biggest problem is insomnia and daytime fatigue.  Pt reports he never had a sleep study.  No snoring.  Gets into the bed at 10 pm and watches the news.  If he takes a "sleeping aid" (OTC, as he states that PCP wouldn't renew his sleep med), he will get to sleep by 2:30 am.    Multiple awakenings to use the bathroom and has some new incontinence.  Out of bed at 8:30am.  Never feels refreshed.  Never dozes.  "I sometimes take a nap but I never go to sleep."  Specific Symptoms:  Tremor: No. Voice: no changes Sleep:   Vivid Dreams:  Yes.    Acting out dreams:  No. Wet Pillows:  No. Postural symptoms:  No.  Falls?  No. Bradykinesia symptoms: slow movements Loss of smell:  No. Loss of taste:  No. Urinary Incontinence:  Yes.   Difficulty Swallowing:  Yes.   (liquids some or some trouble with large pills) Handwriting, micrographia: Yes.    Trouble with ADL's:  No.  Trouble buttoning clothing: No. (slower than used to be) Depression:  No. Memory changes:  No. per pt and wife (wife takes care of finances and always has; pt drives without trouble but does very little) Hallucinations:  No.  visual distortions: No. N/V:  No. Lightheaded:  Yes.    Syncope: No. Diplopia:  No. Dyskinesia:  No.  Neuroimaging has previously been performed.  It is available for my review today.   ALLERGIES:   Allergies  Allergen Reactions  . Penicillins Rash    CURRENT MEDICATIONS:  Outpatient Encounter Prescriptions as of 01/07/2014  Medication Sig  . atorvastatin (LIPITOR) 10 MG tablet Take 1 tablet (10 mg total) by mouth daily.  Marland Kitchen donepezil (ARICEPT) 10 MG tablet Take 10 mg by mouth at bedtime.  . ergocalciferol (VITAMIN D2) 50000 UNITS capsule Take 50,000 Units by mouth once a week. Friday  . glimepiride (AMARYL) 1 MG tablet Take 1 mg by mouth daily before breakfast.  . Memantine HCl ER (NAMENDA XR) 28 MG CP24 Take by mouth.  . metoprolol tartrate (LOPRESSOR) 25 MG tablet Take 0.5 tablets (12.5 mg total) by mouth 2 (two) times daily.  . Multiple Vitamin (MULTIVITAMIN WITH MINERALS) TABS Take 1 tablet by mouth daily.  Marland Kitchen omeprazole (PRILOSEC) 20 MG  capsule Take 20 mg by mouth daily.  . pentoxifylline (TRENTAL) 400 MG CR tablet TAKE 1 TABLET BY MOUTH THREE TIMES A DAY WITH MEALS  . Tamsulosin HCl (FLOMAX) 0.4 MG CAPS Take 0.4 mg by mouth daily.   . [DISCONTINUED] aspirin 325 MG tablet Take 1 tablet (325 mg total) by mouth daily.  . [DISCONTINUED] flurazepam (DALMANE) 30 MG capsule Take 1 capsule (30 mg total) by mouth at bedtime as needed. sleep    PAST MEDICAL HISTORY:    Past Medical History  Diagnosis Date  . Prostate cancer 2000  . PVD (peripheral vascular disease)   . Prostate enlargement   . Insomnia   . Pancreatitis   . PVC's (premature ventricular contractions)   . Heart block   . Diabetes mellitus without complication   . Carotid artery occlusion   . Hypertension     PAST SURGICAL HISTORY:   Past Surgical History  Procedure Laterality Date  . Cholecystectomy  07/18/2011    Procedure: LAPAROSCOPIC CHOLECYSTECTOMY;  Surgeon: Gwenyth Ober, MD;  Location: [place];  Service: General;  Laterality: N/A;  . Prostatectomy    . Endarterectomy Right 05/28/2013    Procedure: ENDARTERECTOMY CAROTID;  Surgeon: Rosetta Posner, MD;  Location: [facility] OR;  Service: Vascular;  Laterality: Right;  . Cataract extraction w/ intraocular lens  implant, bilateral      SOCIAL HISTORY:   History   Social History  . Marital Status: Married    Spouse Name: N/A    Number of Children: N/A  . Years of Education: N/A   Occupational History  . retired     [place] History Main Topics  . Smoking status: Former Smoker -- 20 years    Quit date: 04/19/1962  . Smokeless tobacco: Never Used  . Alcohol Use: No  . Drug Use: No  . Sexual Activity: Not on file   Other Topics Concern  . Not on file   Social History Narrative  . No narrative on file    FAMILY HISTORY:   Family Status  Relation Status Death Age  . Daughter Deceased 55    breast cancer  . Father Deceased     stroke  . Mother Deceased     unknown cause  . Sister Alive     healthy  . Son Alive     healthy    ROS:  A complete 10 system review of systems was obtained and was unremarkable apart from what is mentioned above.  PHYSICAL EXAMINATION:    VITALS:   Filed Vitals:   01/07/14 1428  BP: 150/74  Pulse: 80  Height: 5\' 10"  (1.778 m)  Weight: 206 lb (93.441 kg)    GEN:  The patient appears stated age and is in NAD. HEENT:  Normocephalic, atraumatic.  The mucous  membranes are moist. The superficial temporal arteries are without ropiness or tenderness. CV:  RRR Lungs:  CTAB Neck/HEME:  There are no carotid bruits bilaterally.  Neurological examination:  Orientation:  Montreal Cognitive Assessment  01/07/2014  Visuospatial/ Executive (0/5) 3  Naming (0/3) 2  Attention: Read list of digits (0/2) 2  Attention: Read list of letters (0/1) 1  Attention: Serial 7 subtraction starting at 100 (0/3) 2  Language: Repeat phrase (0/2) 2  Language : Fluency (0/1) 0  Abstraction (0/2) 2  Delayed Recall (0/5) 0  Orientation (0/6) 5  Total 19  Adjusted Score (based on education) 19    Cranial nerves: There  is good facial symmetry.  The right pupil is surgical and not reactive.  The left pupil is round and nonreactive.  Funduscopic exam reveals a clear disc margin on the right.  No red reflex is even identified on the left and cannot view fundus on the left.  Extraocular muscles are intact. The visual fields are full to confrontational testing. The speech is fluent and clear. Soft palate rises symmetrically and there is no tongue deviation. Hearing is intact to conversational tone. Sensation: Sensation is intact to light and pinprick throughout (facial, trunk, extremities).  Does not report decreased pinprick in a stocking distribution. Vibration is markedly decreased distally. There is no extinction with double simultaneous stimulation. There is no sensory dermatomal level identified. Motor: Strength is 5/5 in the bilateral upper and lower extremities.   Shoulder shrug is equal and symmetric.  There is no pronator drift. Deep tendon reflexes: Deep tendon reflexes are 2-/4 at the bilateral biceps, triceps, brachioradialis, patella and absent at the bilateral achilles. Plantar responses are downgoing bilaterally.  Movement examination: Tone: There is normal to the right upper and bilateral lower extremities.  There is gegenhalten in the left upper  extremity. Abnormal movements: None Coordination:  There is no significant decremation with RAM's Gait and Station: The patient has mild difficulty arising out of a deep-seated chair without the use of the hands. The patient's stride length is slightly decreased.  He does have a wide-based gait and a mildly stooped posture.    Labs:  Lab Results  Component Value Date   HGBA1C 6.8* 05/21/2013     ASSESSMENT/PLAN:  1.  Gait instability.  -I. did find evidence on my examination of peripheral neuropathy, which is likely due to diabetes mellitus.  I am going to go ahead and check labs to make sure that we are not missing any other reversible forms of peripheral neuropathy.  We discussed safety associated with peripheral neuropathy.  -The patient does have some for vertebral stenosis, which could be contributing as well.  He had a cerebral infarction in February, but stopped his aspirin on his own as he stated that he just did not like taking this many pills.  I advised him that he may want to restart aspirin, 81 mg daily, but told him to ask his primary care physician first, as he is also on Trental, although that is traditionally for peripheral vascular disease and not central.  We talked about the increased risk of bleeding on both of these medications together. 2.  I do not think that the patient has idiopathic Parkinson's disease. 3.  excessive daytime hypersomnolence, with nighttime insomnia.  -I am going to send him for a nocturnal polysomnogram.  Will call him with the results.  I will check and see if his primary care physician actually does these. 4.  Inability to see fundus on the L  -May be baseline but referring back to ophthalmology as has been over a year and pt reports more vision issues

## 2014-01-08 ENCOUNTER — Ambulatory Visit: Payer: Medicare Other | Admitting: Neurology

## 2014-01-08 ENCOUNTER — Telehealth: Payer: Self-pay | Admitting: Neurology

## 2014-01-08 LAB — VITAMIN B12: Vitamin B-12: 314 pg/mL (ref 211–911)

## 2014-01-08 LAB — RPR

## 2014-01-08 LAB — FOLATE: Folate: >20 ng/mL

## 2014-01-08 NOTE — Telephone Encounter (Signed)
Sleep Study consult with Dr Maxwell Caul scheduled on 02/18/2014 at 11:30 am. Patient made aware. Notes faxed to [phone].

## 2014-01-09 LAB — SPEP & IFE WITH QIG
ALBUMIN ELP: 54 % — AB (ref 55.8–66.1)
Alpha-1-Globulin: 4.2 % (ref 2.9–4.9)
Alpha-2-Globulin: 12.6 % — ABNORMAL HIGH (ref 7.1–11.8)
BETA GLOBULIN: 6 % (ref 4.7–7.2)
Beta 2: 5.4 % (ref 3.2–6.5)
Gamma Globulin: 17.8 % (ref 11.1–18.8)
IGA: 309 mg/dL (ref 68–379)
IGG (IMMUNOGLOBIN G), SERUM: 1270 mg/dL (ref 650–1600)
IGM, SERUM: 100 mg/dL (ref 41–251)
TOTAL PROTEIN, SERUM ELECTROPHOR: 7.4 g/dL (ref 6.0–8.3)

## 2014-01-10 LAB — UIFE/LIGHT CHAINS/TP QN, 24-HR UR
ALBUMIN, U: DETECTED
Alpha 1, Urine: DETECTED — AB
Alpha 2, Urine: DETECTED — AB
BETA UR: DETECTED — AB
Gamma Globulin, Urine: DETECTED — AB
Total Protein, Urine: 17 mg/dL (ref 5–25)

## 2014-01-21 ENCOUNTER — Emergency Department (HOSPITAL_COMMUNITY)
Admission: EM | Admit: 2014-01-21 | Discharge: 2014-01-21 | Disposition: A | Discharge status: Home / Self Care | Payer: Medicare Other | Attending: Emergency Medicine | Admitting: Emergency Medicine

## 2014-01-21 ENCOUNTER — Encounter (HOSPITAL_COMMUNITY): Payer: Self-pay | Admitting: Emergency Medicine

## 2014-01-21 DIAGNOSIS — Z8673 Personal history of transient ischemic attack (TIA), and cerebral infarction without residual deficits: Secondary | ICD-10-CM | POA: Diagnosis not present

## 2014-01-21 DIAGNOSIS — R112 Nausea with vomiting, unspecified: Secondary | ICD-10-CM | POA: Diagnosis not present

## 2014-01-21 DIAGNOSIS — E119 Type 2 diabetes mellitus without complications: Secondary | ICD-10-CM | POA: Insufficient documentation

## 2014-01-21 DIAGNOSIS — Z8546 Personal history of malignant neoplasm of prostate: Secondary | ICD-10-CM | POA: Insufficient documentation

## 2014-01-21 DIAGNOSIS — I493 Ventricular premature depolarization: Secondary | ICD-10-CM | POA: Diagnosis not present

## 2014-01-21 DIAGNOSIS — K5900 Constipation, unspecified: Secondary | ICD-10-CM | POA: Insufficient documentation

## 2014-01-21 DIAGNOSIS — M6281 Muscle weakness (generalized): Secondary | ICD-10-CM | POA: Diagnosis not present

## 2014-01-21 DIAGNOSIS — Z87891 Personal history of nicotine dependence: Secondary | ICD-10-CM | POA: Insufficient documentation

## 2014-01-21 DIAGNOSIS — I10 Essential (primary) hypertension: Secondary | ICD-10-CM | POA: Diagnosis not present

## 2014-01-21 DIAGNOSIS — R404 Transient alteration of awareness: Secondary | ICD-10-CM | POA: Diagnosis not present

## 2014-01-21 DIAGNOSIS — Z880 Allergy status to penicillin: Secondary | ICD-10-CM | POA: Insufficient documentation

## 2014-01-21 DIAGNOSIS — R197 Diarrhea, unspecified: Secondary | ICD-10-CM | POA: Diagnosis not present

## 2014-01-21 DIAGNOSIS — Z79899 Other long term (current) drug therapy: Secondary | ICD-10-CM | POA: Insufficient documentation

## 2014-01-21 DIAGNOSIS — N400 Enlarged prostate without lower urinary tract symptoms: Secondary | ICD-10-CM | POA: Insufficient documentation

## 2014-01-21 DIAGNOSIS — R531 Weakness: Secondary | ICD-10-CM

## 2014-01-21 DIAGNOSIS — R1111 Vomiting without nausea: Secondary | ICD-10-CM | POA: Diagnosis not present

## 2014-01-21 HISTORY — DX: Cerebral infarction, unspecified: I63.9

## 2014-01-21 LAB — URINALYSIS, ROUTINE W REFLEX MICROSCOPIC
Bilirubin Urine: NEGATIVE
Glucose, UA: NEGATIVE mg/dL
Hgb urine dipstick: NEGATIVE
Ketones, ur: NEGATIVE mg/dL
Leukocytes, UA: NEGATIVE
Nitrite: NEGATIVE
Protein, ur: NEGATIVE mg/dL
Specific Gravity, Urine: 1.007 (ref 1.005–1.030)
Urobilinogen, UA: 0.2 mg/dL (ref 0.0–1.0)
pH: 6.5 (ref 5.0–8.0)

## 2014-01-21 LAB — CBC WITH DIFFERENTIAL/PLATELET
Basophils Absolute: 0 10*3/uL (ref 0.0–0.1)
Basophils Relative: 0 % (ref 0–1)
Eosinophils Absolute: 0.2 10*3/uL (ref 0.0–0.7)
Eosinophils Relative: 2 % (ref 0–5)
HCT: 41.9 % (ref 39.0–52.0)
Hemoglobin: 14.2 g/dL (ref 13.0–17.0)
Lymphocytes Relative: 26 % (ref 12–46)
Lymphs Abs: 3.2 10*3/uL (ref 0.7–4.0)
MCH: 30.9 pg (ref 26.0–34.0)
MCHC: 33.9 g/dL (ref 30.0–36.0)
MCV: 91.3 fL (ref 78.0–100.0)
Monocytes Absolute: 1.4 10*3/uL — ABNORMAL HIGH (ref 0.1–1.0)
Monocytes Relative: 11 % (ref 3–12)
Neutro Abs: 7.4 10*3/uL (ref 1.7–7.7)
Neutrophils Relative %: 61 % (ref 43–77)
Platelets: 231 10*3/uL (ref 150–400)
RBC: 4.59 MIL/uL (ref 4.22–5.81)
RDW: 13.2 % (ref 11.5–15.5)
WBC: 12.1 10*3/uL — ABNORMAL HIGH (ref 4.0–10.5)

## 2014-01-21 LAB — COMPREHENSIVE METABOLIC PANEL
ALT: 12 U/L (ref 0–53)
AST: 17 U/L (ref 0–37)
Albumin: 3.5 g/dL (ref 3.5–5.2)
Alkaline Phosphatase: 75 U/L (ref 39–117)
Anion gap: 9 (ref 5–15)
BUN: 17 mg/dL (ref 6–23)
CO2: 28 mEq/L (ref 19–32)
Calcium: 9.2 mg/dL (ref 8.4–10.5)
Chloride: 104 mEq/L (ref 96–112)
Creatinine, Ser: 1.09 mg/dL (ref 0.50–1.35)
GFR calc Af Amer: 67 mL/min — ABNORMAL LOW (ref >90)
GFR calc non Af Amer: 58 mL/min — ABNORMAL LOW (ref >90)
Glucose, Bld: 117 mg/dL — ABNORMAL HIGH (ref 70–99)
Potassium: 3.9 mEq/L (ref 3.7–5.3)
Sodium: 141 mEq/L (ref 137–147)
Total Bilirubin: 0.5 mg/dL (ref 0.3–1.2)
Total Protein: 7 g/dL (ref 6.0–8.3)

## 2014-01-21 LAB — I-STAT TROPONIN, ED: Troponin i, poc: 0.01 ng/mL (ref 0.00–0.08)

## 2014-01-21 MED ORDER — THIAMINE HCL 100 MG/ML IJ SOLN
100.0000 mg | Freq: Once | INTRAMUSCULAR | Status: AC
  Administered 2014-01-21: 100 mg via INTRAVENOUS
  Filled 2014-01-21: qty 2

## 2014-01-21 MED ORDER — SODIUM CHLORIDE 0.9 % IV BOLUS (SEPSIS)
1000.0000 mL | Freq: Once | INTRAVENOUS | Status: AC
  Administered 2014-01-21: 1000 mL via INTRAVENOUS

## 2014-01-21 NOTE — ED Provider Notes (Signed)
CSN: [id number]     Arrival date & time 01/21/14  1326 History   First MD Initiated Contact with Patient 01/21/14 1328     Chief Complaint  Patient presents with  . Weakness     (Consider location/radiation/quality/duration/timing/severity/associated sxs/prior Treatment) HPI Pt is a 80yo male with hx of PVCs, heart block, PVD, pancreatitis, HTN, CAD, diabetes w/o complication, stroke and prostate enlargement presenting to ED with c/o gradually worsening weakness for past 2-3 days that started after pt developed diarrhea.  Pt states he was constipated and started using mirilax to help but states he believes he took too much because he has had about 10 episodes of watery diarrhea since last night with associated fatigue and generalized weakness.  Pt states he was unable to climb the stairs to his bedroom last night due to the severe weakness.  Pt states he has had constipation in past, denies being on narcotic pain medication. Denies recent use of antibiotics or sick contacts. Pt lives at home with his wife.   Past Medical History  Diagnosis Date  . Prostate cancer 2000  . PVD (peripheral vascular disease)   . Prostate enlargement   . Insomnia   . Pancreatitis   . PVC's (premature ventricular contractions)   . Heart block   . Diabetes mellitus without complication   . Carotid artery occlusion   . Hypertension   . Stroke    Past Surgical History  Procedure Laterality Date  . Cholecystectomy  07/18/2011    Procedure: LAPAROSCOPIC CHOLECYSTECTOMY;  Surgeon: Gwenyth Ober, MD;  Location: [place];  Service: General;  Laterality: N/A;  . Prostatectomy    . Endarterectomy Right 05/28/2013    Procedure: ENDARTERECTOMY CAROTID;  Surgeon: Rosetta Posner, MD;  Location: [facility] OR;  Service: Vascular;  Laterality: Right;  . Cataract extraction w/ intraocular lens  implant, bilateral     Family History  Problem Relation Age of Onset  . Breast cancer Daughter   . Cancer Daughter   . Stroke Father     History  Substance Use Topics  . Smoking status: Former Smoker -- 20 years    Quit date: 04/19/1962  . Smokeless tobacco: Never Used  . Alcohol Use: No    Review of Systems  Constitutional: Positive for fatigue. Negative for fever, chills and unexpected weight change.  Cardiovascular: Negative for chest pain and palpitations.  Gastrointestinal: Positive for nausea, vomiting ( x1 this morning), diarrhea and constipation. Negative for abdominal pain and blood in stool.  Genitourinary: Negative for dysuria, urgency, frequency, hematuria and decreased urine volume.  Musculoskeletal: Negative for back pain and myalgias.  Neurological: Positive for weakness ( generalized).      Allergies  Penicillins  Home Medications   Prior to Admission medications   Medication Sig Start Date End Date Taking? Authorizing Provider  atorvastatin (LIPITOR) 10 MG tablet Take 1 tablet (10 mg total) by mouth daily. 05/30/13   Horton Finer, MD  donepezil (ARICEPT) 10 MG tablet Take 10 mg by mouth at bedtime.    Historical Provider, MD  ergocalciferol (VITAMIN D2) 50000 UNITS capsule Take 50,000 Units by mouth once a week. Friday    Historical Provider, MD  glimepiride (AMARYL) 1 MG tablet Take 1 mg by mouth daily before breakfast.    Historical Provider, MD  Memantine HCl ER (NAMENDA XR) 28 MG CP24 Take by mouth.    Historical Provider, MD  metoprolol tartrate (LOPRESSOR) 25 MG tablet Take 0.5 tablets (12.5 mg total) by mouth  2 (two) times daily. 05/25/13   Horton Finer, MD  Multiple Vitamin (MULTIVITAMIN WITH MINERALS) TABS Take 1 tablet by mouth daily.    Historical Provider, MD  omeprazole (PRILOSEC) 20 MG capsule Take 20 mg by mouth daily.    Historical Provider, MD  pentoxifylline (TRENTAL) 400 MG CR tablet TAKE 1 TABLET BY MOUTH THREE TIMES A DAY WITH MEALS 05/12/13   Jettie Booze, MD  Tamsulosin HCl (FLOMAX) 0.4 MG CAPS Take 0.4 mg by mouth daily.     Historical Provider, MD    BP 181/62  Pulse 50  Temp(Src) 98 F (36.7 C) (Oral)  Resp 16  Ht 5\' 10"  (1.778 m)  Wt 210 lb (95.255 kg)  BMI 30.13 kg/m2  SpO2 99% Physical Exam  Nursing note and vitals reviewed. Constitutional: He is oriented to person, place, and time. He appears well-developed and well-nourished.  Elderly male lying in exam bed, appears mild fatigued. NAD  HENT:  Head: Normocephalic and atraumatic.  Eyes: Conjunctivae are normal. No scleral icterus.  Neck: Normal range of motion.  Cardiovascular: Regular rhythm and normal heart sounds.  Bradycardia present.   Pulmonary/Chest: Effort normal and breath sounds normal. No respiratory distress. He has no wheezes. He has no rales. He exhibits no tenderness.  No respiratory distress, able to speak in full sentences w/o difficulty. Lungs: CTAB  Abdominal: Soft. Bowel sounds are normal. He exhibits no distension and no mass. There is no tenderness. There is no rebound and no guarding.  Musculoskeletal: Normal range of motion.  Neurological: He is alert and oriented to person, place, and time. No cranial nerve deficit.  Skin: Skin is warm and dry.    ED Course  Procedures (including critical care time) Labs Review Labs Reviewed  CBC WITH DIFFERENTIAL - Abnormal; Notable for the following:    WBC 12.1 (*)    Monocytes Absolute 1.4 (*)    All other components within normal limits  COMPREHENSIVE METABOLIC PANEL - Abnormal; Notable for the following:    Glucose, Bld 117 (*)    GFR calc non Af Amer 58 (*)    GFR calc Af Amer 67 (*)    All other components within normal limits  URINE CULTURE  STOOL CULTURE  URINALYSIS, ROUTINE W REFLEX MICROSCOPIC  I-STAT TROPOININ, ED    Imaging Review No results found.   EKG Interpretation   Date/Time:  Monday January 21 2014 14:22:01 EDT Ventricular Rate:  47 PR Interval:  241 QRS Duration: 90 QT Interval:  442 QTC Calculation: 391 R Axis:   11 Text Interpretation:  Sinus bradycardia Prolonged  PR interval Nonspecific  repol abnormality, lateral leads Confirmed by HORTON  MD, COURTNEY ([id number])  on 01/21/2014 3:46:56 PM      MDM   Final diagnoses:  Generalized weakness  Diarrhea    Pt is a 80yo male c/o generalized weakness and fatigue after 3 day hx of diarrhea. Reports about 10 episodes of watery diarrhea since last night. Denies chest pain or SOB. Denies abdominal pain or urinary symptoms. Vitals: mild bradycardia, hx of same.   Pt given 1L IV fluids and thiamine.  Labs: unremarkable 3:58 PM pt states he is feeling better and would like to eat something. Discussed pt with Dr. Dina Rich who will examine pt to help determine disposition.   4:11 PM pt evaluated by Dr. Dina Rich, states he is feeling better. Pt eating sandwhich at this time. Pt to be discharged once he ambulates around ED.  Advised to f/u  with PCP. Return precautions provided.      Noland Fordyce, PA-C 01/22/14 [phone]

## 2014-01-21 NOTE — ED Notes (Signed)
Attempted to ambulate pt with assist x2. Pt unstable and off balance. Pt unaware that he was off balance. Notified MD

## 2014-01-21 NOTE — ED Notes (Signed)
Pt remains monitored by blood pressure, 12 lead, and pulse ox.

## 2014-01-21 NOTE — ED Notes (Signed)
Pt given sandwich and coke. Pt tolerated well

## 2014-01-21 NOTE — ED Notes (Signed)
Pt started having weakness for the past couple of days. Pt normally walks around house with no problem. Today pt was unable to walk well, having diarrhea for past 3 days and N/V. Pt states he was constipated prior to the diarrhea and he believes he too took much laxative. BP 150/80, HR 50, 97% on room air, CBG 217.

## 2014-01-21 NOTE — ED Notes (Signed)
Pt placed into gown and on monitor upon arrival to room. Pt monitored by blood pressure, pulse ox, and 12 lead.  

## 2014-01-22 LAB — URINE CULTURE
Colony Count: NO GROWTH
Culture: NO GROWTH

## 2014-01-22 NOTE — ED Provider Notes (Signed)
Medical screening examination/treatment/procedure(s) were conducted as a shared visit with non-physician practitioner(s) and myself.  I personally evaluated the patient during the encounter.   EKG Interpretation   Date/Time:  Monday January 21 2014 14:22:01 EDT Ventricular Rate:  47 PR Interval:  241 QRS Duration: 90 QT Interval:  442 QTC Calculation: 391 R Axis:   11 Text Interpretation:  Sinus bradycardia Prolonged PR interval Nonspecific  repol abnormality, lateral leads Confirmed by Skyley Grandmaison  MD, Deina Lipsey ([id number])  on 01/21/2014 3:46:56 PM      This is a 80 year old male who presents with generalized weakness. Patient reports that over the last 2-3 days, he has felt "drained." He has had progressively worsening diarrhea after starting MiraLAX for constipation. He reports up to 10 stools daily. He denies any dizziness upon standing, headache, focal weakness, numbness, tingling, or vision changes. He states that he is able to and with this morning to get his breakfast but felt too tired to go back upstairs. On exam he is nonfocal. Vital signs are reassuring.  Given history of worsening diarrhea, suspect element of dehydration or electrolyte abnormality. We'll send basic labwork. Patient given fluids and thiamine. Reports he is feeling better.  Lab work and EKG are reassuring. Patient is able to tolerate food and ambulate at his baseline. Patient to followup with his primary physician closely. Patient given strict return precautions.  Merryl Hacker, MD 01/23/14 [phone]

## 2014-01-25 DIAGNOSIS — Z961 Presence of intraocular lens: Secondary | ICD-10-CM | POA: Diagnosis not present

## 2014-01-25 DIAGNOSIS — H35373 Puckering of macula, bilateral: Secondary | ICD-10-CM | POA: Diagnosis not present

## 2014-01-25 DIAGNOSIS — E109 Type 1 diabetes mellitus without complications: Secondary | ICD-10-CM | POA: Diagnosis not present

## 2014-01-25 DIAGNOSIS — H3531 Nonexudative age-related macular degeneration: Secondary | ICD-10-CM | POA: Diagnosis not present

## 2014-01-30 DIAGNOSIS — G20 Parkinson's disease: Secondary | ICD-10-CM | POA: Diagnosis not present

## 2014-01-30 DIAGNOSIS — G479 Sleep disorder, unspecified: Secondary | ICD-10-CM | POA: Diagnosis not present

## 2014-01-30 DIAGNOSIS — R197 Diarrhea, unspecified: Secondary | ICD-10-CM | POA: Diagnosis not present

## 2014-01-30 DIAGNOSIS — R5383 Other fatigue: Secondary | ICD-10-CM | POA: Diagnosis not present

## 2014-03-19 DIAGNOSIS — J90 Pleural effusion, not elsewhere classified: Secondary | ICD-10-CM | POA: Diagnosis not present

## 2014-03-19 DIAGNOSIS — R5383 Other fatigue: Secondary | ICD-10-CM | POA: Diagnosis not present

## 2014-03-20 ENCOUNTER — Ambulatory Visit
Admission: RE | Admit: 2014-03-20 | Discharge: 2014-03-20 | Disposition: A | Discharge status: Home / Self Care | Payer: Medicare Other | Source: Ambulatory Visit | Attending: Internal Medicine | Admitting: Internal Medicine

## 2014-03-20 ENCOUNTER — Other Ambulatory Visit: Payer: Self-pay | Admitting: Internal Medicine

## 2014-03-20 DIAGNOSIS — I10 Essential (primary) hypertension: Secondary | ICD-10-CM | POA: Diagnosis not present

## 2014-03-20 DIAGNOSIS — R5383 Other fatigue: Secondary | ICD-10-CM | POA: Diagnosis not present

## 2014-03-20 DIAGNOSIS — J90 Pleural effusion, not elsewhere classified: Secondary | ICD-10-CM

## 2014-03-20 DIAGNOSIS — R531 Weakness: Secondary | ICD-10-CM | POA: Diagnosis not present

## 2014-04-02 DIAGNOSIS — E1159 Type 2 diabetes mellitus with other circulatory complications: Secondary | ICD-10-CM | POA: Diagnosis not present

## 2014-04-02 DIAGNOSIS — G479 Sleep disorder, unspecified: Secondary | ICD-10-CM | POA: Diagnosis not present

## 2014-04-02 DIAGNOSIS — J309 Allergic rhinitis, unspecified: Secondary | ICD-10-CM | POA: Diagnosis not present

## 2014-04-02 DIAGNOSIS — I10 Essential (primary) hypertension: Secondary | ICD-10-CM | POA: Diagnosis not present

## 2014-04-02 DIAGNOSIS — R5383 Other fatigue: Secondary | ICD-10-CM | POA: Diagnosis not present

## 2014-04-02 DIAGNOSIS — F329 Major depressive disorder, single episode, unspecified: Secondary | ICD-10-CM | POA: Diagnosis not present

## 2014-07-04 DIAGNOSIS — G479 Sleep disorder, unspecified: Secondary | ICD-10-CM | POA: Diagnosis not present

## 2014-07-04 DIAGNOSIS — R5383 Other fatigue: Secondary | ICD-10-CM | POA: Diagnosis not present

## 2014-07-04 DIAGNOSIS — F329 Major depressive disorder, single episode, unspecified: Secondary | ICD-10-CM | POA: Diagnosis not present

## 2014-07-04 DIAGNOSIS — E1359 Other specified diabetes mellitus with other circulatory complications: Secondary | ICD-10-CM | POA: Diagnosis not present

## 2014-07-04 DIAGNOSIS — E539 Vitamin B deficiency, unspecified: Secondary | ICD-10-CM | POA: Diagnosis not present

## 2014-07-15 DIAGNOSIS — R5383 Other fatigue: Secondary | ICD-10-CM | POA: Diagnosis not present

## 2014-07-30 ENCOUNTER — Encounter: Payer: Self-pay | Admitting: Endocrinology

## 2014-07-30 ENCOUNTER — Ambulatory Visit (INDEPENDENT_AMBULATORY_CARE_PROVIDER_SITE_OTHER): Payer: Medicare Other | Admitting: Endocrinology

## 2014-07-30 VITALS — BP 120/64 | HR 49 | Temp 98.5°F | Ht 70.0 in | Wt 197.0 lb

## 2014-07-30 DIAGNOSIS — Z125 Encounter for screening for malignant neoplasm of prostate: Secondary | ICD-10-CM | POA: Insufficient documentation

## 2014-07-30 LAB — PSA, MEDICARE: PSA: 0.01 ng/mL — AB (ref 0.10–4.00)

## 2014-07-30 MED ORDER — TESTOSTERONE 20.25 MG/ACT (1.62%) TD GEL: 1.0000 | Freq: Every day | TRANSDERMAL | Status: DC

## 2014-07-30 NOTE — Patient Instructions (Addendum)
Here is a prescription for testosterone.  You apply it to your skin, once a day. A PSA requested for you today.  We'll let you know about the results.  If it is high, we would not necessarily do anything with the results, except we would know it is too dangerous to take the testosterone.   normalization of testosterone is not known to harm you.  however, there are "theoretical" risks, including increased fertility, hair loss, prostate cancer, benign prostate enlargement, blood clots, liver problems, lower hdl ("good cholesterol"), polycythemia (opposite of anemia), sleep apnea, and behavior changes Please come back for a follow-up appointment in 1 month.

## 2014-07-30 NOTE — Progress Notes (Signed)
Subjective:    Patient ID: Zachary [name], male    DOB: 1921-10-22, 81 y.o.   MRN: [id number]  HPI Pt reports he had puberty at the normal age.  He has 3 biological children.  He says he has never taken illicit androgens.  He has never been on any prescribed medication for hypogonadism.  He does not take antiandrogens or opioids.  He denies any h/o infertility, XRT, or genital infection.  He has never had surgery, or a serious injury to the head or genital area.  He does not consume alcohol excessively. Pt states 1 year of moderate weakness throughout the body, and slight assoc weight loss.   Past Medical History  Diagnosis Date  . Prostate cancer 2000  . PVD (peripheral vascular disease)   . Prostate enlargement   . Insomnia   . Pancreatitis   . PVC's (premature ventricular contractions)   . Heart block   . Diabetes mellitus without complication   . Carotid artery occlusion   . Hypertension   . Stroke     Past Surgical History  Procedure Laterality Date  . Cholecystectomy  07/18/2011    Procedure: LAPAROSCOPIC CHOLECYSTECTOMY;  Surgeon: Gwenyth Ober, MD;  Location: [place];  Service: General;  Laterality: N/A;  . Prostatectomy    . Endarterectomy Right 05/28/2013    Procedure: ENDARTERECTOMY CAROTID;  Surgeon: Rosetta Posner, MD;  Location: [facility] OR;  Service: Vascular;  Laterality: Right;  . Cataract extraction w/ intraocular lens  implant, bilateral      History   Social History  . Marital Status: Married    Spouse Name: N/A  . Number of Children: N/A  . Years of Education: N/A   Occupational History  . retired     [place] History Main Topics  . Smoking status: Former Smoker -- 20 years    Quit date: 04/19/1962  . Smokeless tobacco: Never Used  . Alcohol Use: No  . Drug Use: No  . Sexual Activity: Not on file   Other Topics Concern  . Not on file   Social History Narrative    Current Outpatient Prescriptions on File Prior to Visit    Medication Sig Dispense Refill  . atorvastatin (LIPITOR) 10 MG tablet Take 1 tablet (10 mg total) by mouth daily.    Marland Kitchen donepezil (ARICEPT) 10 MG tablet Take 10 mg by mouth at bedtime.    . ergocalciferol (VITAMIN D2) 50000 UNITS capsule Take 50,000 Units by mouth once a week. Friday    . glimepiride (AMARYL) 1 MG tablet Take 1 mg by mouth daily before breakfast.    . Memantine HCl ER (NAMENDA XR) 28 MG CP24 Take by mouth.    . metoprolol tartrate (LOPRESSOR) 25 MG tablet Take 0.5 tablets (12.5 mg total) by mouth 2 (two) times daily.    . Multiple Vitamin (MULTIVITAMIN WITH MINERALS) TABS Take 1 tablet by mouth daily.    Marland Kitchen omeprazole (PRILOSEC) 20 MG capsule Take 20 mg by mouth daily.    . Tamsulosin HCl (FLOMAX) 0.4 MG CAPS Take 0.4 mg by mouth daily.      No current facility-administered medications on file prior to visit.    Allergies  Allergen Reactions  . Penicillins Rash    Family History  Problem Relation Age of Onset  . Breast cancer Daughter   . Cancer Daughter   . Stroke Father   . Other      hypogonadism  BP 120/64 mmHg  Pulse 49  Temp(Src) 98.5 F (36.9 C) (Oral)  Ht 5\' 10"  (1.778 m)  Wt 197 lb (89.359 kg)  BMI 28.27 kg/m2  SpO2 92%    Review of Systems denies depression, erectile dysfunction, decreased urinary stream, gynecomastia, fever, headache, easy bruising, sob, rash, blurry vision, and chest pain.  He has slight numbness of the feet, ED sxs, arthralgias, and rhinorrhea.    Objective:   Physical Exam Vital signs: see vs page Gen: elderly, frail, no distress HEAD: head: no deformity eyes: no periorbital swelling, no proptosis external nose and ears are normal mouth: no lesion seen NECK: supple, thyroid is not enlarged CHEST WALL: no deformity LUNGS: clear to auscultation BREASTS:  No gynecomastia CV: reg rate and rhythm, no murmur ABD: abdomen is soft, nontender.  no hepatosplenomegaly.  not distended.  no hernia GENITALIA:  Normal male.    MUSCULOSKELETAL: muscle bulk and strength are grossly normal.  no obvious joint swelling.  gait is steady with a cane.   EXTEMITIES: no deformity.  no edema PULSES: no carotid bruit.  NEURO:  cn 2-12 grossly intact.   readily moves all 4's.  sensation is intact to touch on all 4's.  SKIN:  Normal texture and temperature.  No rash or suspicious lesion is visible.  Normal hair distribution.  NODES:  None palpable at the neck PSYCH: alert, well-oriented.  Does not appear anxious nor depressed.    outside test results are reviewed: total testosterone=179  Lab Results  Component Value Date   PSA 0.01* 07/30/2014       Assessment & Plan:  Hypogonadism, new, uncertain etiology Frail elderly state: we discussed testosterone rx at this advanced age.  Pt says he wants to try, despite advanced age.   Weakness: uncertain if this is related to hypogonadism.   Patient is advised the following: Patient Instructions  Here is a prescription for testosterone.  You apply it to your skin, once a day. A PSA requested for you today.  We'll let you know about the results.  If it is high, we would not necessarily do anything with the results, except we would know it is too dangerous to take the testosterone.   normalization of testosterone is not known to harm you.  however, there are "theoretical" risks, including increased fertility, hair loss, prostate cancer, benign prostate enlargement, blood clots, liver problems, lower hdl ("good cholesterol"), polycythemia (opposite of anemia), sleep apnea, and behavior changes Please come back for a follow-up appointment in 1 month.

## 2014-07-31 ENCOUNTER — Telehealth: Payer: Self-pay | Admitting: Endocrinology

## 2014-07-31 NOTE — Telephone Encounter (Signed)
Pt needs rx for testosterone sent [facility] not [place] thank you

## 2014-08-01 MED ORDER — TESTOSTERONE 20.25 MG/ACT (1.62%) TD GEL: 1.0000 | Freq: Every day | TRANSDERMAL | Status: DC

## 2014-08-01 NOTE — Telephone Encounter (Signed)
i printed 

## 2014-08-01 NOTE — Telephone Encounter (Signed)
Rx sent to pharmacy per pt's request.  

## 2014-08-01 NOTE — Telephone Encounter (Signed)
Please advise if ok to re-send testosterone rx. Rx from 07/30/2014 has already been shredded at our office and the Rx from [place] has been cancelled.   Thanks!

## 2014-08-05 ENCOUNTER — Telehealth: Payer: Self-pay | Admitting: Endocrinology

## 2014-08-05 NOTE — Telephone Encounter (Signed)
Patient called stating that his Rx has not been received by his pharmacy   Rx: Testosterone Gel  Pharmacy: [facility] [address]    Thank you

## 2014-08-05 NOTE — Telephone Encounter (Signed)
Rx sent to pharmacy per pt's request.  

## 2014-08-13 ENCOUNTER — Telehealth: Payer: Self-pay

## 2014-08-13 NOTE — Telephone Encounter (Signed)
Pt called to report he has been experiencing extreme fatigue. Also, he stated that since starting testosterone gel he has been having diarrhea. Pt wanted to know if he should make a office visit to discuss these two issues. He was hoping the testosterone would help with his activity levels.  Please advise, Thanks!

## 2014-08-13 NOTE — Telephone Encounter (Signed)
Patient advised of note below and voiced understanding.  

## 2014-08-13 NOTE — Telephone Encounter (Signed)
It is extremely unlikely to be coming from the testosterone.  Please see PCP for symptoms.

## 2014-08-15 DIAGNOSIS — G479 Sleep disorder, unspecified: Secondary | ICD-10-CM | POA: Diagnosis not present

## 2014-08-15 DIAGNOSIS — R5383 Other fatigue: Secondary | ICD-10-CM | POA: Diagnosis not present

## 2014-08-15 DIAGNOSIS — E291 Testicular hypofunction: Secondary | ICD-10-CM | POA: Diagnosis not present

## 2014-08-15 DIAGNOSIS — R197 Diarrhea, unspecified: Secondary | ICD-10-CM | POA: Diagnosis not present

## 2014-08-26 ENCOUNTER — Telehealth: Payer: Self-pay | Admitting: Endocrinology

## 2014-08-26 NOTE — Telephone Encounter (Signed)
Pt is asking if the testosterone he is on could be causing the severe diarrhea he has been having for the past 2 weeks.

## 2014-08-26 NOTE — Telephone Encounter (Signed)
No, please see PCP for this

## 2014-08-26 NOTE — Telephone Encounter (Signed)
See note below and please advise, Thanks! 

## 2014-08-26 NOTE — Telephone Encounter (Signed)
I contacted the patient and advised of note below. Patient voiced understanding.

## 2014-08-28 ENCOUNTER — Encounter: Payer: Self-pay | Admitting: Endocrinology

## 2014-08-28 ENCOUNTER — Ambulatory Visit (INDEPENDENT_AMBULATORY_CARE_PROVIDER_SITE_OTHER): Payer: Medicare Other | Admitting: Endocrinology

## 2014-08-28 ENCOUNTER — Ambulatory Visit: Payer: Medicare Other | Admitting: Endocrinology

## 2014-08-28 VITALS — BP 132/84 | HR 93 | Temp 98.1°F | Wt 190.0 lb

## 2014-08-28 DIAGNOSIS — R197 Diarrhea, unspecified: Secondary | ICD-10-CM

## 2014-08-28 DIAGNOSIS — E291 Testicular hypofunction: Secondary | ICD-10-CM | POA: Insufficient documentation

## 2014-08-28 NOTE — Patient Instructions (Signed)
Please see a specialist for the diarrhea.  you will receive a phone call, about a day and time for an appointment. Also, please see your PCP if you have any more problems, aside from the testosterone. When you have addressed the diarrhea, please resume the testosterone gel, and return here 1 month later.

## 2014-08-28 NOTE — Progress Notes (Signed)
Subjective:    Patient ID: Zachary [name], male    DOB: Mar 16, 1922, 81 y.o.   MRN: [id number]  HPI Pt returns for f/u of hypogonadism (uncertain etiology; dx'ed 2016; he has 3 biological children; he says he has never taken illicit androgens; he had never been on any prescribed medication for hypogonadism until 2016).  He has a few weeks of frequent diarrhea, but no pain at the abdomen.  He has assoc weight loss.  He stopped androgel, thinking this might be causing the diarrhea.  Past Medical History  Diagnosis Date  . Prostate cancer 2000  . PVD (peripheral vascular disease)   . Prostate enlargement   . Insomnia   . Pancreatitis   . PVC's (premature ventricular contractions)   . Heart block   . Diabetes mellitus without complication   . Carotid artery occlusion   . Hypertension   . Stroke     Past Surgical History  Procedure Laterality Date  . Cholecystectomy  07/18/2011    Procedure: LAPAROSCOPIC CHOLECYSTECTOMY;  Surgeon: Gwenyth Ober, MD;  Location: [place];  Service: General;  Laterality: N/A;  . Prostatectomy    . Endarterectomy Right 05/28/2013    Procedure: ENDARTERECTOMY CAROTID;  Surgeon: Rosetta Posner, MD;  Location: [facility] OR;  Service: Vascular;  Laterality: Right;  . Cataract extraction w/ intraocular lens  implant, bilateral      History   Social History  . Marital Status: Married    Spouse Name: N/A  . Number of Children: N/A  . Years of Education: N/A   Occupational History  . retired     [place] History Main Topics  . Smoking status: Former Smoker -- 20 years    Quit date: 04/19/1962  . Smokeless tobacco: Never Used  . Alcohol Use: No  . Drug Use: No  . Sexual Activity: Not on file   Other Topics Concern  . Not on file   Social History Narrative    Current Outpatient Prescriptions on File Prior to Visit  Medication Sig Dispense Refill  . atorvastatin (LIPITOR) 10 MG tablet Take 1 tablet (10 mg total) by mouth daily.    Marland Kitchen  donepezil (ARICEPT) 10 MG tablet Take 10 mg by mouth at bedtime.    . ergocalciferol (VITAMIN D2) 50000 UNITS capsule Take 50,000 Units by mouth once a week. Friday    . glimepiride (AMARYL) 1 MG tablet Take 1 mg by mouth daily before breakfast.    . Memantine HCl ER (NAMENDA XR) 28 MG CP24 Take by mouth.    . metoprolol tartrate (LOPRESSOR) 25 MG tablet Take 0.5 tablets (12.5 mg total) by mouth 2 (two) times daily.    . Multiple Vitamin (MULTIVITAMIN WITH MINERALS) TABS Take 1 tablet by mouth daily.    Marland Kitchen omeprazole (PRILOSEC) 20 MG capsule Take 20 mg by mouth daily.    . sertraline (ZOLOFT) 50 MG tablet Take 50 mg by mouth daily.    . Tamsulosin HCl (FLOMAX) 0.4 MG CAPS Take 0.4 mg by mouth daily.     . Testosterone 20.25 MG/ACT (1.62%) GEL Place 1 Squirt onto the skin daily. 75 g 0   No current facility-administered medications on file prior to visit.    Allergies  Allergen Reactions  . Penicillins Rash    Family History  Problem Relation Age of Onset  . Breast cancer Daughter   . Cancer Daughter   . Stroke Father   . Other  hypogonadism    BP 132/84 mmHg  Pulse 93  Temp(Src) 98.1 F (36.7 C) (Oral)  Wt 190 lb (86.183 kg)  SpO2 95%    Review of Systems He felt no better when he was on the testosterone gel (he stopped yesterday).  Denies decreased urinary stream.    Objective:   Physical Exam VITAL SIGNS:  See vs page GENERAL: no distress ABDOMEN: abdomen is soft, nontender.  no hepatosplenomegaly.  not distended.  no hernia.   Ext: no edema       Assessment & Plan:  Diarrhea, new, uncertain etiology Hypogonadism: androgel is not related to the above.    Patient is advised the following: Patient Instructions  Please see a specialist for the diarrhea.  you will receive a phone call, about a day and time for an appointment. Also, please see your PCP if you have any more problems, aside from the testosterone. When you have addressed the diarrhea, please  resume the testosterone gel, and return here 1 month later.

## 2014-08-30 ENCOUNTER — Ambulatory Visit: Payer: Medicare Other | Admitting: Endocrinology

## 2014-09-02 ENCOUNTER — Telehealth: Payer: Self-pay | Admitting: Endocrinology

## 2014-09-02 NOTE — Telephone Encounter (Signed)
Patient stated that no one has called him to scheduled his REFERRAL TO GASTROENTEROLOGY, he said that he feel really bad to the point where need to go to the hospital. Please advise

## 2014-09-02 NOTE — Telephone Encounter (Signed)
I contacted the patient and advised the referral to the GI specialist has been sent to [facility]. Pt was given the number for the specialist office and he will contact them about the referral.

## 2014-09-11 DIAGNOSIS — R197 Diarrhea, unspecified: Secondary | ICD-10-CM | POA: Diagnosis not present

## 2014-09-17 ENCOUNTER — Other Ambulatory Visit: Payer: Self-pay | Admitting: Gastroenterology

## 2014-09-20 ENCOUNTER — Other Ambulatory Visit: Payer: Self-pay | Admitting: Gastroenterology

## 2014-09-23 ENCOUNTER — Encounter (HOSPITAL_COMMUNITY)
Admission: RE | Disposition: A | Discharge status: Home / Self Care | Payer: Self-pay | Source: Ambulatory Visit | Attending: Gastroenterology

## 2014-09-23 ENCOUNTER — Encounter (HOSPITAL_COMMUNITY): Payer: Self-pay

## 2014-09-23 ENCOUNTER — Ambulatory Visit (HOSPITAL_COMMUNITY)
Admission: RE | Admit: 2014-09-23 | Discharge: 2014-09-23 | Disposition: A | Discharge status: Home / Self Care | Payer: Medicare Other | Source: Ambulatory Visit | Attending: Gastroenterology | Admitting: Gastroenterology

## 2014-09-23 DIAGNOSIS — E780 Pure hypercholesterolemia: Secondary | ICD-10-CM | POA: Diagnosis not present

## 2014-09-23 DIAGNOSIS — I739 Peripheral vascular disease, unspecified: Secondary | ICD-10-CM | POA: Insufficient documentation

## 2014-09-23 DIAGNOSIS — Z8673 Personal history of transient ischemic attack (TIA), and cerebral infarction without residual deficits: Secondary | ICD-10-CM | POA: Insufficient documentation

## 2014-09-23 DIAGNOSIS — K5730 Diverticulosis of large intestine without perforation or abscess without bleeding: Secondary | ICD-10-CM | POA: Insufficient documentation

## 2014-09-23 DIAGNOSIS — K529 Noninfective gastroenteritis and colitis, unspecified: Secondary | ICD-10-CM | POA: Diagnosis not present

## 2014-09-23 DIAGNOSIS — R197 Diarrhea, unspecified: Secondary | ICD-10-CM | POA: Diagnosis not present

## 2014-09-23 DIAGNOSIS — Z880 Allergy status to penicillin: Secondary | ICD-10-CM | POA: Diagnosis not present

## 2014-09-23 DIAGNOSIS — Z8546 Personal history of malignant neoplasm of prostate: Secondary | ICD-10-CM | POA: Diagnosis not present

## 2014-09-23 HISTORY — PX: FLEXIBLE SIGMOIDOSCOPY: SHX5431

## 2014-09-23 LAB — GLUCOSE, CAPILLARY: GLUCOSE-CAPILLARY: 62 mg/dL — AB (ref 65–99)

## 2014-09-23 SURGERY — SIGMOIDOSCOPY, FLEXIBLE
Anesthesia: Moderate Sedation

## 2014-09-23 SURGERY — COLONOSCOPY WITH PROPOFOL
Anesthesia: Monitor Anesthesia Care

## 2014-09-23 MED ORDER — MIDAZOLAM HCL 10 MG/2ML IJ SOLN
INTRAMUSCULAR | Status: DC | PRN
  Administered 2014-09-23 (×2): 1 mg via INTRAVENOUS

## 2014-09-23 MED ORDER — FENTANYL CITRATE (PF) 100 MCG/2ML IJ SOLN
INTRAMUSCULAR | Status: AC
  Filled 2014-09-23: qty 2

## 2014-09-23 MED ORDER — SODIUM CHLORIDE 0.9 % IV SOLN
INTRAVENOUS | Status: DC
  Administered 2014-09-23: 500 mL via INTRAVENOUS

## 2014-09-23 MED ORDER — MIDAZOLAM HCL 5 MG/ML IJ SOLN
INTRAMUSCULAR | Status: AC
  Filled 2014-09-23: qty 2

## 2014-09-23 MED ORDER — FENTANYL CITRATE (PF) 100 MCG/2ML IJ SOLN
INTRAMUSCULAR | Status: DC | PRN
  Administered 2014-09-23 (×2): 10 ug via INTRAVENOUS

## 2014-09-23 NOTE — Op Note (Signed)
Procedure: Diagnostic flexible proctosigmoidoscopy with random colon biopsies to evaluate chronic watery diarrhea  Endoscopist: Earle Gell  Premedication: Versed 2 mg. Fentanyl 20 g.  Procedure: The patient was placed in the left lateral decubitus position. Anal inspection and digital rectal exam were normal. The Pentax pediatric colonoscope was introduced into the rectum and advanced to approximately 60 cm from the anal verge. The patient underwent an unprepped diagnostic flexible proctosigmoidoscopy.  Endoscopic appearance of the rectum and left colon with the colonoscope advanced to 60 cm from the anal verge showed left colonic diverticulosis with normal-appearing rectal and colonic mucosa without signs of colonic inflammation or colonic stricture formation. There was a large amount of solid light tan stool filling the left colon. Retroflexed view of the distal rectum was normal.  Random colon biopsies were performed from the descending colon and sigmoid colon to look for signs of microscopic colitis.  Assessment: Normal diagnostic flexible proctosigmoidoscopy to 60 cm from the anal verge except for left colonic diverticulosis. Random colon biopsies to rule out microscopic colitis pending

## 2014-09-23 NOTE — Discharge Instructions (Signed)
Flexible Sigmoidoscopy, Care After Refer to this sheet in the next few weeks. These instructions provide you with information on caring for yourself after your procedure. Your health care provider may also give you more specific instructions. Your treatment has been planned according to current medical practices, but problems sometimes occur. Call your health care provider if you have any problems or questions after your procedure. WHAT TO EXPECT AFTER THE PROCEDURE After your procedure, it is typical to have the following:   Abdominal cramps.  Bloating.  A small amount of rectal bleeding if you had a biopsy. HOME CARE INSTRUCTIONS  Only take over-the-counter or prescription medicines for pain, fever, or discomfort as directed by your health care provider.  Resume your normal diet and activities as directed by your health care provider. SEEK MEDICAL CARE IF:  You have abdominal pain or cramping that lasts longer than 1 hour after the procedure.  You continue to have small amounts of rectal bleeding after 24 hours.  You have nausea or vomiting.  You feel weak or dizzy. SEEK IMMEDIATE MEDICAL CARE IF:   You have a fever.  You pass large blood clots or see a large amount of blood in the toilet after having a bowel movement. This may also occur 10-14 days after the procedure. It is more likely if you had a biopsy.  You develop abdominal pain that is not relieved with medicine or your abdominal pain gets worse.  You have nausea or vomiting for more than 24 hours after the procedure. Document Released: 04/10/2013 Document Reviewed: 04/10/2013 [facility] Patient Information 2015 [place], [place]. This information is not intended to replace advice given to you by your health care provider. Make sure you discuss any questions you have with your health care provider.  Conscious Sedation, Adult, Care After Refer to this sheet in the next few weeks. These instructions provide you with  information on caring for yourself after your procedure. Your health care provider may also give you more specific instructions. Your treatment has been planned according to current medical practices, but problems sometimes occur. Call your health care provider if you have any problems or questions after your procedure. WHAT TO EXPECT AFTER THE PROCEDURE  After your procedure:  You may feel sleepy, clumsy, and have poor balance for several hours.  Vomiting may occur if you eat too soon after the procedure. HOME CARE INSTRUCTIONS  Do not participate in any activities where you could become injured for at least 24 hours. Do not:  Drive.  Swim.  Ride a bicycle.  Operate heavy machinery.  Cook.  Use power tools.  Climb ladders.  Work from a high place.  Do not make important decisions or sign legal documents until you are improved.  If you vomit, drink water, juice, or soup when you can drink without vomiting. Make sure you have little or no nausea before eating solid foods.  Only take over-the-counter or prescription medicines for pain, discomfort, or fever as directed by your health care provider.  Make sure you and your family fully understand everything about the medicines given to you, including what side effects may occur.  You should not drink alcohol, take sleeping pills, or take medicines that cause drowsiness for at least 24 hours.  If you smoke, do not smoke without supervision.  If you are feeling better, you may resume normal activities 24 hours after you were sedated.  Keep all appointments with your health care provider. SEEK MEDICAL CARE IF:  Your skin  is pale or bluish in color.  You continue to feel nauseous or vomit.  Your pain is getting worse and is not helped by medicine.  You have bleeding or swelling.  You are still sleepy or feeling clumsy after 24 hours. SEEK IMMEDIATE MEDICAL CARE IF:  You develop a rash.  You have difficulty  breathing.  You develop any type of allergic problem.  You have a fever. MAKE SURE YOU:  Understand these instructions.  Will watch your condition.  Will get help right away if you are not doing well or get worse. Document Released: 01/24/2013 Document Reviewed: 01/24/2013 [facility] Patient Information 2015 [place], [place]. This information is not intended to replace advice given to you by your health care provider. Make sure you discuss any questions you have with your health care provider.

## 2014-09-23 NOTE — H&P (Signed)
  Problem: Chronic, watery diarrhea on sertraline, omeprazole, Namenda  History: The patient is a 82 year old male born 29-Dec-1921. For approximately one month he has had frequent watery, nonbloody bowel movements unassociated with gastrointestinal bleeding or abdominal pain. He does not experience nocturnal diarrhea. His diarrhea occurs both with food intake or if he remains fasting. He denies fevers or anorexia. He has lost approximately 22 pounds. He denies travel or anti-biotic use. He says his diarrhea started when he was prescribed a testosterone patch. His episodes of diarrhea are preceded by tense bowel urgency which can lead to watery fecal incontinence. She has not tried any medication to control his diarrhea. He underwent a cholecystectomy approximately one and a half years ago but did not experience diarrhea following his cholecystectomy.  Stool screen for C. difficile toxin was negative. Stool fecal fat stain was negative. Stool lactoferrin was not run.  The patient is scheduled to undergo diagnostic flexible proctosigmoidoscopy with random biopsies to look for microscopic colitis associated with sertraline and omeprazole  Past medical history: Laparoscopic cholecystectomy in 2013. Bilateral cataract surgery. External been radiation plus seed implants for prostate cancer in 2000. Circumcision. Tonsillectomy. Hemorrhoidectomy. Allergic rhinitis. Cerebrovascular disease. Type 2 diabetes mellitus. Hypercholesterolemia. Peripheral vascular disease associated with intermittent claudication. Carotid artery disease. Stroke leading to left hemiparesis.  Medication allergies: Penicillin  Exam: The patient is alert and lying comfortably on the endoscopy stretcher. Abdomen is soft and nontender to palpation. Cardiac exam reveals a regular rhythm. Lungs are clear to auscultation.  Plan: Proceed with diagnostic flexible proctosigmoidoscopy with random colon biopsies performed to look for microscopic  colitis

## 2014-09-24 ENCOUNTER — Encounter (HOSPITAL_COMMUNITY): Payer: Self-pay | Admitting: Gastroenterology

## 2014-10-10 DIAGNOSIS — K529 Noninfective gastroenteritis and colitis, unspecified: Secondary | ICD-10-CM | POA: Diagnosis not present

## 2014-10-15 ENCOUNTER — Encounter: Payer: Self-pay | Admitting: Endocrinology

## 2014-10-15 ENCOUNTER — Ambulatory Visit (INDEPENDENT_AMBULATORY_CARE_PROVIDER_SITE_OTHER): Payer: Medicare Other | Admitting: Endocrinology

## 2014-10-15 VITALS — BP 132/84 | HR 72 | Temp 97.3°F | Ht 70.0 in | Wt 190.0 lb

## 2014-10-15 DIAGNOSIS — E291 Testicular hypofunction: Secondary | ICD-10-CM

## 2014-10-15 MED ORDER — TESTOSTERONE 20.25 MG/ACT (1.62%) TD GEL: 1.0000 | Freq: Every day | TRANSDERMAL | Status: DC

## 2014-10-15 NOTE — Patient Instructions (Addendum)
Please resume the testosterone gel, and have the blood test rechecked in a few weeks.   Please come back for a follow-up appointment in 6 months

## 2014-10-15 NOTE — Progress Notes (Signed)
Subjective:    Patient ID: Zachary [name], male    DOB: 05-23-1921, 81 y.o.   MRN: [id number]  HPI Pt returns for f/u of hypogonadism (uncertain etiology; dx'ed 2016; he has 3 biological children; he says he has never taken illicit androgens; he had never been on any prescribed medication for hypogonadism until 2016).  W/u of diarrhea was neg, and he takes prn rx for this.  He stopped the testosterone 1 month ago, as he felt it was not helping him.   Past Medical History  Diagnosis Date  . Prostate cancer 2000  . PVD (peripheral vascular disease)   . Prostate enlargement   . Insomnia   . Pancreatitis   . PVC's (premature ventricular contractions)   . Heart block   . Diabetes mellitus without complication   . Carotid artery occlusion   . Hypertension   . Stroke     Past Surgical History  Procedure Laterality Date  . Cholecystectomy  07/18/2011    Procedure: LAPAROSCOPIC CHOLECYSTECTOMY;  Surgeon: Gwenyth Ober, MD;  Location: [place];  Service: General;  Laterality: N/A;  . Prostatectomy    . Endarterectomy Right 05/28/2013    Procedure: ENDARTERECTOMY CAROTID;  Surgeon: Rosetta Posner, MD;  Location: [facility] OR;  Service: Vascular;  Laterality: Right;  . Cataract extraction w/ intraocular lens  implant, bilateral    . Flexible sigmoidoscopy N/A 09/23/2014    Procedure: FLEXIBLE SIGMOIDOSCOPY;  Surgeon: Garlan Fair, MD;  Location: [facility];  Service: Endoscopy;  Laterality: N/A;    History   Social History  . Marital Status: Married    Spouse Name: N/A  . Number of Children: N/A  . Years of Education: N/A   Occupational History  . retired     [place] History Main Topics  . Smoking status: Former Smoker -- 20 years    Quit date: 04/19/1962  . Smokeless tobacco: Never Used  . Alcohol Use: No  . Drug Use: No  . Sexual Activity: Not on file   Other Topics Concern  . Not on file   Social History Narrative    Current Outpatient Prescriptions on  File Prior to Visit  Medication Sig Dispense Refill  . atorvastatin (LIPITOR) 10 MG tablet Take 1 tablet (10 mg total) by mouth daily.    Marland Kitchen donepezil (ARICEPT) 10 MG tablet Take 10 mg by mouth at bedtime.    . ergocalciferol (VITAMIN D2) 50000 UNITS capsule Take 50,000 Units by mouth once a week. Friday    . glimepiride (AMARYL) 1 MG tablet Take 1 mg by mouth daily before breakfast.    . Memantine HCl ER (NAMENDA XR) 28 MG CP24 Take by mouth.    . metoprolol tartrate (LOPRESSOR) 25 MG tablet Take 0.5 tablets (12.5 mg total) by mouth 2 (two) times daily.    . Multiple Vitamin (MULTIVITAMIN WITH MINERALS) TABS Take 1 tablet by mouth daily.    Marland Kitchen omeprazole (PRILOSEC) 20 MG capsule Take 20 mg by mouth daily.    . sertraline (ZOLOFT) 50 MG tablet Take 50 mg by mouth daily.    . Tamsulosin HCl (FLOMAX) 0.4 MG CAPS Take 0.4 mg by mouth daily.      No current facility-administered medications on file prior to visit.    Allergies  Allergen Reactions  . Penicillins Rash    Family History  Problem Relation Age of Onset  . Breast cancer Daughter   . Cancer Daughter   .  Stroke Father   . Other      hypogonadism    BP 132/84 mmHg  Pulse 72  Temp(Src) 97.3 F (36.3 C) (Oral)  Ht 5\' 10"  (1.778 m)  Wt 190 lb (86.183 kg)  BMI 27.26 kg/m2  SpO2 91%   Review of Systems Denies decreased urinary stream    Objective:   Physical Exam VITAL SIGNS:  See vs page GENERAL: no distress Ext: no edema.       Assessment & Plan:  Idiopathic central hypogonadism: questionable benefit from supplementation.  We discussed staying off testosterone vs resuming.  Pt says he wants to resume.  Patient is advised the following: Patient Instructions  Please resume the testosterone gel, and have the blood test rechecked in a few weeks.   Please come back for a follow-up appointment in 6 months

## 2014-10-29 ENCOUNTER — Other Ambulatory Visit: Payer: Medicare Other

## 2014-11-04 DIAGNOSIS — D72829 Elevated white blood cell count, unspecified: Secondary | ICD-10-CM | POA: Diagnosis not present

## 2014-11-04 DIAGNOSIS — I739 Peripheral vascular disease, unspecified: Secondary | ICD-10-CM | POA: Diagnosis not present

## 2014-11-04 DIAGNOSIS — E1159 Type 2 diabetes mellitus with other circulatory complications: Secondary | ICD-10-CM | POA: Diagnosis not present

## 2014-11-04 DIAGNOSIS — E785 Hyperlipidemia, unspecified: Secondary | ICD-10-CM | POA: Diagnosis not present

## 2014-11-04 DIAGNOSIS — K529 Noninfective gastroenteritis and colitis, unspecified: Secondary | ICD-10-CM | POA: Diagnosis not present

## 2014-11-06 DIAGNOSIS — F329 Major depressive disorder, single episode, unspecified: Secondary | ICD-10-CM | POA: Diagnosis not present

## 2014-11-06 DIAGNOSIS — E1151 Type 2 diabetes mellitus with diabetic peripheral angiopathy without gangrene: Secondary | ICD-10-CM | POA: Diagnosis not present

## 2014-11-06 DIAGNOSIS — G20 Parkinson's disease: Secondary | ICD-10-CM | POA: Diagnosis not present

## 2014-11-06 DIAGNOSIS — I69354 Hemiplegia and hemiparesis following cerebral infarction affecting left non-dominant side: Secondary | ICD-10-CM | POA: Diagnosis not present

## 2014-11-06 DIAGNOSIS — I70209 Unspecified atherosclerosis of native arteries of extremities, unspecified extremity: Secondary | ICD-10-CM | POA: Diagnosis not present

## 2014-11-06 DIAGNOSIS — K529 Noninfective gastroenteritis and colitis, unspecified: Secondary | ICD-10-CM | POA: Diagnosis not present

## 2014-11-12 DIAGNOSIS — K529 Noninfective gastroenteritis and colitis, unspecified: Secondary | ICD-10-CM | POA: Diagnosis not present

## 2014-11-12 DIAGNOSIS — I69354 Hemiplegia and hemiparesis following cerebral infarction affecting left non-dominant side: Secondary | ICD-10-CM | POA: Diagnosis not present

## 2014-11-12 DIAGNOSIS — G20 Parkinson's disease: Secondary | ICD-10-CM | POA: Diagnosis not present

## 2014-11-12 DIAGNOSIS — I70209 Unspecified atherosclerosis of native arteries of extremities, unspecified extremity: Secondary | ICD-10-CM | POA: Diagnosis not present

## 2014-11-12 DIAGNOSIS — E1151 Type 2 diabetes mellitus with diabetic peripheral angiopathy without gangrene: Secondary | ICD-10-CM | POA: Diagnosis not present

## 2014-11-12 DIAGNOSIS — F329 Major depressive disorder, single episode, unspecified: Secondary | ICD-10-CM | POA: Diagnosis not present

## 2014-11-15 DIAGNOSIS — I69354 Hemiplegia and hemiparesis following cerebral infarction affecting left non-dominant side: Secondary | ICD-10-CM | POA: Diagnosis not present

## 2014-11-15 DIAGNOSIS — G20 Parkinson's disease: Secondary | ICD-10-CM | POA: Diagnosis not present

## 2014-11-15 DIAGNOSIS — K529 Noninfective gastroenteritis and colitis, unspecified: Secondary | ICD-10-CM | POA: Diagnosis not present

## 2014-11-15 DIAGNOSIS — E1151 Type 2 diabetes mellitus with diabetic peripheral angiopathy without gangrene: Secondary | ICD-10-CM | POA: Diagnosis not present

## 2014-11-15 DIAGNOSIS — F329 Major depressive disorder, single episode, unspecified: Secondary | ICD-10-CM | POA: Diagnosis not present

## 2014-11-15 DIAGNOSIS — I70209 Unspecified atherosclerosis of native arteries of extremities, unspecified extremity: Secondary | ICD-10-CM | POA: Diagnosis not present

## 2014-11-18 DIAGNOSIS — K529 Noninfective gastroenteritis and colitis, unspecified: Secondary | ICD-10-CM | POA: Diagnosis not present

## 2014-11-18 DIAGNOSIS — E1151 Type 2 diabetes mellitus with diabetic peripheral angiopathy without gangrene: Secondary | ICD-10-CM | POA: Diagnosis not present

## 2014-11-18 DIAGNOSIS — G20 Parkinson's disease: Secondary | ICD-10-CM | POA: Diagnosis not present

## 2014-11-18 DIAGNOSIS — F329 Major depressive disorder, single episode, unspecified: Secondary | ICD-10-CM | POA: Diagnosis not present

## 2014-11-18 DIAGNOSIS — I70209 Unspecified atherosclerosis of native arteries of extremities, unspecified extremity: Secondary | ICD-10-CM | POA: Diagnosis not present

## 2014-11-18 DIAGNOSIS — I69354 Hemiplegia and hemiparesis following cerebral infarction affecting left non-dominant side: Secondary | ICD-10-CM | POA: Diagnosis not present

## 2014-11-20 DIAGNOSIS — K529 Noninfective gastroenteritis and colitis, unspecified: Secondary | ICD-10-CM | POA: Diagnosis not present

## 2014-11-21 DIAGNOSIS — E1151 Type 2 diabetes mellitus with diabetic peripheral angiopathy without gangrene: Secondary | ICD-10-CM | POA: Diagnosis not present

## 2014-11-21 DIAGNOSIS — F329 Major depressive disorder, single episode, unspecified: Secondary | ICD-10-CM | POA: Diagnosis not present

## 2014-11-21 DIAGNOSIS — K529 Noninfective gastroenteritis and colitis, unspecified: Secondary | ICD-10-CM | POA: Diagnosis not present

## 2014-11-21 DIAGNOSIS — I69354 Hemiplegia and hemiparesis following cerebral infarction affecting left non-dominant side: Secondary | ICD-10-CM | POA: Diagnosis not present

## 2014-11-21 DIAGNOSIS — G20 Parkinson's disease: Secondary | ICD-10-CM | POA: Diagnosis not present

## 2014-11-21 DIAGNOSIS — I70209 Unspecified atherosclerosis of native arteries of extremities, unspecified extremity: Secondary | ICD-10-CM | POA: Diagnosis not present

## 2014-11-26 DIAGNOSIS — I70209 Unspecified atherosclerosis of native arteries of extremities, unspecified extremity: Secondary | ICD-10-CM | POA: Diagnosis not present

## 2014-11-26 DIAGNOSIS — E1151 Type 2 diabetes mellitus with diabetic peripheral angiopathy without gangrene: Secondary | ICD-10-CM | POA: Diagnosis not present

## 2014-11-26 DIAGNOSIS — I69354 Hemiplegia and hemiparesis following cerebral infarction affecting left non-dominant side: Secondary | ICD-10-CM | POA: Diagnosis not present

## 2014-11-26 DIAGNOSIS — F329 Major depressive disorder, single episode, unspecified: Secondary | ICD-10-CM | POA: Diagnosis not present

## 2014-11-26 DIAGNOSIS — K529 Noninfective gastroenteritis and colitis, unspecified: Secondary | ICD-10-CM | POA: Diagnosis not present

## 2014-11-26 DIAGNOSIS — G20 Parkinson's disease: Secondary | ICD-10-CM | POA: Diagnosis not present

## 2014-11-27 DIAGNOSIS — I70209 Unspecified atherosclerosis of native arteries of extremities, unspecified extremity: Secondary | ICD-10-CM | POA: Diagnosis not present

## 2014-11-27 DIAGNOSIS — G20 Parkinson's disease: Secondary | ICD-10-CM | POA: Diagnosis not present

## 2014-11-27 DIAGNOSIS — K529 Noninfective gastroenteritis and colitis, unspecified: Secondary | ICD-10-CM | POA: Diagnosis not present

## 2014-11-27 DIAGNOSIS — I69354 Hemiplegia and hemiparesis following cerebral infarction affecting left non-dominant side: Secondary | ICD-10-CM | POA: Diagnosis not present

## 2014-11-27 DIAGNOSIS — F329 Major depressive disorder, single episode, unspecified: Secondary | ICD-10-CM | POA: Diagnosis not present

## 2014-11-27 DIAGNOSIS — E1151 Type 2 diabetes mellitus with diabetic peripheral angiopathy without gangrene: Secondary | ICD-10-CM | POA: Diagnosis not present

## 2014-11-29 DIAGNOSIS — I70209 Unspecified atherosclerosis of native arteries of extremities, unspecified extremity: Secondary | ICD-10-CM | POA: Diagnosis not present

## 2014-11-29 DIAGNOSIS — G20 Parkinson's disease: Secondary | ICD-10-CM | POA: Diagnosis not present

## 2014-11-29 DIAGNOSIS — K529 Noninfective gastroenteritis and colitis, unspecified: Secondary | ICD-10-CM | POA: Diagnosis not present

## 2014-11-29 DIAGNOSIS — F329 Major depressive disorder, single episode, unspecified: Secondary | ICD-10-CM | POA: Diagnosis not present

## 2014-11-29 DIAGNOSIS — I69354 Hemiplegia and hemiparesis following cerebral infarction affecting left non-dominant side: Secondary | ICD-10-CM | POA: Diagnosis not present

## 2014-11-29 DIAGNOSIS — E1151 Type 2 diabetes mellitus with diabetic peripheral angiopathy without gangrene: Secondary | ICD-10-CM | POA: Diagnosis not present

## 2014-12-02 DIAGNOSIS — G20 Parkinson's disease: Secondary | ICD-10-CM | POA: Diagnosis not present

## 2014-12-02 DIAGNOSIS — I70209 Unspecified atherosclerosis of native arteries of extremities, unspecified extremity: Secondary | ICD-10-CM | POA: Diagnosis not present

## 2014-12-02 DIAGNOSIS — K529 Noninfective gastroenteritis and colitis, unspecified: Secondary | ICD-10-CM | POA: Diagnosis not present

## 2014-12-02 DIAGNOSIS — I69354 Hemiplegia and hemiparesis following cerebral infarction affecting left non-dominant side: Secondary | ICD-10-CM | POA: Diagnosis not present

## 2014-12-02 DIAGNOSIS — E1151 Type 2 diabetes mellitus with diabetic peripheral angiopathy without gangrene: Secondary | ICD-10-CM | POA: Diagnosis not present

## 2014-12-02 DIAGNOSIS — F329 Major depressive disorder, single episode, unspecified: Secondary | ICD-10-CM | POA: Diagnosis not present

## 2014-12-04 DIAGNOSIS — K529 Noninfective gastroenteritis and colitis, unspecified: Secondary | ICD-10-CM | POA: Diagnosis not present

## 2014-12-04 DIAGNOSIS — I69354 Hemiplegia and hemiparesis following cerebral infarction affecting left non-dominant side: Secondary | ICD-10-CM | POA: Diagnosis not present

## 2014-12-04 DIAGNOSIS — E1151 Type 2 diabetes mellitus with diabetic peripheral angiopathy without gangrene: Secondary | ICD-10-CM | POA: Diagnosis not present

## 2014-12-04 DIAGNOSIS — I70209 Unspecified atherosclerosis of native arteries of extremities, unspecified extremity: Secondary | ICD-10-CM | POA: Diagnosis not present

## 2014-12-04 DIAGNOSIS — F329 Major depressive disorder, single episode, unspecified: Secondary | ICD-10-CM | POA: Diagnosis not present

## 2014-12-04 DIAGNOSIS — G20 Parkinson's disease: Secondary | ICD-10-CM | POA: Diagnosis not present

## 2014-12-09 DIAGNOSIS — E119 Type 2 diabetes mellitus without complications: Secondary | ICD-10-CM | POA: Diagnosis not present

## 2014-12-09 DIAGNOSIS — H3531 Nonexudative age-related macular degeneration: Secondary | ICD-10-CM | POA: Diagnosis not present

## 2014-12-09 DIAGNOSIS — H04123 Dry eye syndrome of bilateral lacrimal glands: Secondary | ICD-10-CM | POA: Diagnosis not present

## 2014-12-09 DIAGNOSIS — H35373 Puckering of macula, bilateral: Secondary | ICD-10-CM | POA: Diagnosis not present

## 2014-12-09 LAB — HM DIABETES EYE EXAM

## 2014-12-10 ENCOUNTER — Encounter: Payer: Self-pay | Admitting: Endocrinology

## 2014-12-10 DIAGNOSIS — G20 Parkinson's disease: Secondary | ICD-10-CM | POA: Diagnosis not present

## 2014-12-10 DIAGNOSIS — F329 Major depressive disorder, single episode, unspecified: Secondary | ICD-10-CM | POA: Diagnosis not present

## 2014-12-10 DIAGNOSIS — I70209 Unspecified atherosclerosis of native arteries of extremities, unspecified extremity: Secondary | ICD-10-CM | POA: Diagnosis not present

## 2014-12-10 DIAGNOSIS — I69354 Hemiplegia and hemiparesis following cerebral infarction affecting left non-dominant side: Secondary | ICD-10-CM | POA: Diagnosis not present

## 2014-12-10 DIAGNOSIS — E1151 Type 2 diabetes mellitus with diabetic peripheral angiopathy without gangrene: Secondary | ICD-10-CM | POA: Diagnosis not present

## 2014-12-10 DIAGNOSIS — K529 Noninfective gastroenteritis and colitis, unspecified: Secondary | ICD-10-CM | POA: Diagnosis not present

## 2014-12-12 ENCOUNTER — Ambulatory Visit
Admission: RE | Admit: 2014-12-12 | Discharge: 2014-12-12 | Disposition: A | Discharge status: Home / Self Care | Payer: Medicare Other | Source: Ambulatory Visit | Attending: Internal Medicine | Admitting: Internal Medicine

## 2014-12-12 ENCOUNTER — Other Ambulatory Visit: Payer: Self-pay | Admitting: Internal Medicine

## 2014-12-12 DIAGNOSIS — R059 Cough, unspecified: Secondary | ICD-10-CM

## 2014-12-12 DIAGNOSIS — R05 Cough: Secondary | ICD-10-CM

## 2014-12-12 DIAGNOSIS — D72829 Elevated white blood cell count, unspecified: Secondary | ICD-10-CM | POA: Diagnosis not present

## 2014-12-12 DIAGNOSIS — R0602 Shortness of breath: Secondary | ICD-10-CM | POA: Diagnosis not present

## 2014-12-12 DIAGNOSIS — K219 Gastro-esophageal reflux disease without esophagitis: Secondary | ICD-10-CM | POA: Diagnosis not present

## 2014-12-12 DIAGNOSIS — I959 Hypotension, unspecified: Secondary | ICD-10-CM | POA: Diagnosis not present

## 2014-12-17 DIAGNOSIS — I69354 Hemiplegia and hemiparesis following cerebral infarction affecting left non-dominant side: Secondary | ICD-10-CM | POA: Diagnosis not present

## 2014-12-17 DIAGNOSIS — E1151 Type 2 diabetes mellitus with diabetic peripheral angiopathy without gangrene: Secondary | ICD-10-CM | POA: Diagnosis not present

## 2014-12-17 DIAGNOSIS — D72829 Elevated white blood cell count, unspecified: Secondary | ICD-10-CM | POA: Diagnosis not present

## 2014-12-17 DIAGNOSIS — K529 Noninfective gastroenteritis and colitis, unspecified: Secondary | ICD-10-CM | POA: Diagnosis not present

## 2014-12-17 DIAGNOSIS — I70209 Unspecified atherosclerosis of native arteries of extremities, unspecified extremity: Secondary | ICD-10-CM | POA: Diagnosis not present

## 2014-12-17 DIAGNOSIS — R5383 Other fatigue: Secondary | ICD-10-CM | POA: Diagnosis not present

## 2014-12-17 DIAGNOSIS — F329 Major depressive disorder, single episode, unspecified: Secondary | ICD-10-CM | POA: Diagnosis not present

## 2014-12-17 DIAGNOSIS — G20 Parkinson's disease: Secondary | ICD-10-CM | POA: Diagnosis not present

## 2014-12-17 DIAGNOSIS — R05 Cough: Secondary | ICD-10-CM | POA: Diagnosis not present

## 2014-12-24 ENCOUNTER — Telehealth: Payer: Self-pay | Admitting: Hematology

## 2014-12-24 DIAGNOSIS — G20 Parkinson's disease: Secondary | ICD-10-CM | POA: Diagnosis not present

## 2014-12-24 DIAGNOSIS — I70209 Unspecified atherosclerosis of native arteries of extremities, unspecified extremity: Secondary | ICD-10-CM | POA: Diagnosis not present

## 2014-12-24 DIAGNOSIS — K529 Noninfective gastroenteritis and colitis, unspecified: Secondary | ICD-10-CM | POA: Diagnosis not present

## 2014-12-24 DIAGNOSIS — I69354 Hemiplegia and hemiparesis following cerebral infarction affecting left non-dominant side: Secondary | ICD-10-CM | POA: Diagnosis not present

## 2014-12-24 DIAGNOSIS — F329 Major depressive disorder, single episode, unspecified: Secondary | ICD-10-CM | POA: Diagnosis not present

## 2014-12-24 DIAGNOSIS — E1151 Type 2 diabetes mellitus with diabetic peripheral angiopathy without gangrene: Secondary | ICD-10-CM | POA: Diagnosis not present

## 2014-12-24 NOTE — Telephone Encounter (Signed)
Lt mess for pt regarding new patient appt on 9/15 at 2:30

## 2014-12-30 ENCOUNTER — Encounter: Payer: Self-pay | Admitting: Family

## 2014-12-31 ENCOUNTER — Encounter: Payer: Self-pay | Admitting: Family

## 2014-12-31 ENCOUNTER — Emergency Department (HOSPITAL_COMMUNITY)
Admission: EM | Admit: 2014-12-31 | Discharge: 2014-12-31 | Disposition: A | Discharge status: Home / Self Care | Payer: Medicare Other | Attending: Emergency Medicine | Admitting: Emergency Medicine

## 2014-12-31 ENCOUNTER — Ambulatory Visit (INDEPENDENT_AMBULATORY_CARE_PROVIDER_SITE_OTHER): Payer: Medicare Other | Admitting: Family

## 2014-12-31 ENCOUNTER — Other Ambulatory Visit: Payer: Self-pay | Admitting: Vascular Surgery

## 2014-12-31 ENCOUNTER — Encounter (HOSPITAL_COMMUNITY): Payer: Self-pay | Admitting: Emergency Medicine

## 2014-12-31 ENCOUNTER — Emergency Department (HOSPITAL_COMMUNITY): Payer: Medicare Other

## 2014-12-31 ENCOUNTER — Ambulatory Visit (HOSPITAL_COMMUNITY)
Admission: RE | Admit: 2014-12-31 | Discharge: 2014-12-31 | Disposition: A | Discharge status: Home / Self Care | Payer: Medicare Other | Source: Ambulatory Visit | Attending: Family | Admitting: Family

## 2014-12-31 VITALS — BP 99/53 | HR 51 | Temp 97.1°F | Resp 14 | Ht 70.0 in | Wt 189.0 lb

## 2014-12-31 DIAGNOSIS — W19XXXA Unspecified fall, initial encounter: Secondary | ICD-10-CM

## 2014-12-31 DIAGNOSIS — Z880 Allergy status to penicillin: Secondary | ICD-10-CM | POA: Insufficient documentation

## 2014-12-31 DIAGNOSIS — Y9389 Activity, other specified: Secondary | ICD-10-CM | POA: Diagnosis not present

## 2014-12-31 DIAGNOSIS — S0011XA Contusion of right eyelid and periocular area, initial encounter: Secondary | ICD-10-CM | POA: Insufficient documentation

## 2014-12-31 DIAGNOSIS — Z8673 Personal history of transient ischemic attack (TIA), and cerebral infarction without residual deficits: Secondary | ICD-10-CM | POA: Insufficient documentation

## 2014-12-31 DIAGNOSIS — Z9889 Other specified postprocedural states: Secondary | ICD-10-CM

## 2014-12-31 DIAGNOSIS — Z8669 Personal history of other diseases of the nervous system and sense organs: Secondary | ICD-10-CM | POA: Diagnosis not present

## 2014-12-31 DIAGNOSIS — Z8546 Personal history of malignant neoplasm of prostate: Secondary | ICD-10-CM | POA: Diagnosis not present

## 2014-12-31 DIAGNOSIS — Y92002 Bathroom of unspecified non-institutional (private) residence single-family (private) house as the place of occurrence of the external cause: Secondary | ICD-10-CM | POA: Diagnosis not present

## 2014-12-31 DIAGNOSIS — E119 Type 2 diabetes mellitus without complications: Secondary | ICD-10-CM | POA: Diagnosis not present

## 2014-12-31 DIAGNOSIS — Z87891 Personal history of nicotine dependence: Secondary | ICD-10-CM | POA: Diagnosis not present

## 2014-12-31 DIAGNOSIS — W1839XA Other fall on same level, initial encounter: Secondary | ICD-10-CM | POA: Insufficient documentation

## 2014-12-31 DIAGNOSIS — Z79899 Other long term (current) drug therapy: Secondary | ICD-10-CM | POA: Diagnosis not present

## 2014-12-31 DIAGNOSIS — Z8679 Personal history of other diseases of the circulatory system: Secondary | ICD-10-CM | POA: Diagnosis not present

## 2014-12-31 DIAGNOSIS — S0003XA Contusion of scalp, initial encounter: Secondary | ICD-10-CM | POA: Diagnosis not present

## 2014-12-31 DIAGNOSIS — M542 Cervicalgia: Secondary | ICD-10-CM | POA: Diagnosis not present

## 2014-12-31 DIAGNOSIS — Z87438 Personal history of other diseases of male genital organs: Secondary | ICD-10-CM | POA: Diagnosis not present

## 2014-12-31 DIAGNOSIS — Z48812 Encounter for surgical aftercare following surgery on the circulatory system: Secondary | ICD-10-CM

## 2014-12-31 DIAGNOSIS — R001 Bradycardia, unspecified: Secondary | ICD-10-CM | POA: Insufficient documentation

## 2014-12-31 DIAGNOSIS — Y92099 Unspecified place in other non-institutional residence as the place of occurrence of the external cause: Secondary | ICD-10-CM

## 2014-12-31 DIAGNOSIS — I6523 Occlusion and stenosis of bilateral carotid arteries: Secondary | ICD-10-CM

## 2014-12-31 DIAGNOSIS — I10 Essential (primary) hypertension: Secondary | ICD-10-CM | POA: Diagnosis not present

## 2014-12-31 DIAGNOSIS — Y92009 Unspecified place in unspecified non-institutional (private) residence as the place of occurrence of the external cause: Secondary | ICD-10-CM

## 2014-12-31 DIAGNOSIS — Z8719 Personal history of other diseases of the digestive system: Secondary | ICD-10-CM | POA: Insufficient documentation

## 2014-12-31 DIAGNOSIS — Y998 Other external cause status: Secondary | ICD-10-CM | POA: Insufficient documentation

## 2014-12-31 DIAGNOSIS — S0990XA Unspecified injury of head, initial encounter: Secondary | ICD-10-CM | POA: Diagnosis present

## 2014-12-31 DIAGNOSIS — S0083XA Contusion of other part of head, initial encounter: Secondary | ICD-10-CM | POA: Diagnosis not present

## 2014-12-31 NOTE — Discharge Instructions (Signed)
Hematoma A hematoma is a collection of blood under the skin, in an organ, in a body space, in a joint space, or in other tissue. The blood can clot to form a lump that you can see and feel. The lump is often firm and may sometimes become sore and tender. Most hematomas get better in a few days to weeks. However, some hematomas may be serious and require medical care. Hematomas can range in size from very small to very large. CAUSES  A hematoma can be caused by a blunt or penetrating injury. It can also be caused by spontaneous leakage from a blood vessel under the skin. Spontaneous leakage from a blood vessel is more likely to occur in older people, especially those taking blood thinners. Sometimes, a hematoma can develop after certain medical procedures. SIGNS AND SYMPTOMS   A firm lump on the body.  Possible pain and tenderness in the area.  Bruising.Blue, dark blue, purple-red, or yellowish skin may appear at the site of the hematoma if the hematoma is close to the surface of the skin. For hematomas in deeper tissues or body spaces, the signs and symptoms may be subtle. For example, an intra-abdominal hematoma may cause abdominal pain, weakness, fainting, and shortness of breath. An intracranial hematoma may cause a headache or symptoms such as weakness, trouble speaking, or a change in consciousness. DIAGNOSIS  A hematoma can usually be diagnosed based on your medical history and a physical exam. Imaging tests may be needed if your health care provider suspects a hematoma in deeper tissues or body spaces, such as the abdomen, head, or chest. These tests may include ultrasonography or a CT scan.  TREATMENT  Hematomas usually go away on their own over time. Rarely does the blood need to be drained out of the body. Large hematomas or those that may affect vital organs will sometimes need surgical drainage or monitoring. HOME CARE INSTRUCTIONS   Apply ice to the injured area:   Put ice in a  plastic bag.   Place a towel between your skin and the bag.   Leave the ice on for 20 minutes, 2-3 times a day for the first 1 to 2 days.   After the first 2 days, switch to using warm compresses on the hematoma.   Elevate the injured area to help decrease pain and swelling. Wrapping the area with an elastic bandage may also be helpful. Compression helps to reduce swelling and promotes shrinking of the hematoma. Make sure the bandage is not wrapped too tight.   If your hematoma is on a lower extremity and is painful, crutches may be helpful for a couple days.   Only take over-the-counter or prescription medicines as directed by your health care provider. SEEK IMMEDIATE MEDICAL CARE IF:   You have increasing pain, or your pain is not controlled with medicine.   You have a fever.   You have worsening swelling or discoloration.   Your skin over the hematoma breaks or starts bleeding.   Your hematoma is in your chest or abdomen and you have weakness, shortness of breath, or a change in consciousness.  Your hematoma is on your scalp (caused by a fall or injury) and you have a worsening headache or a change in alertness or consciousness. MAKE SURE YOU:   Understand these instructions.  Will watch your condition.  Will get help right away if you are not doing well or get worse. Document Released: 11/18/2003 Document Revised: 12/06/2012 Document Reviewed: 09/13/2012  ExitCare® Patient Information ©2015 [employer]. This information is not intended to replace advice given to you by your health care provider. Make sure you discuss any questions you have with your health care provider. °Head Injury °You have received a head injury. It does not appear serious at this time. Headaches and vomiting are common following head injury. It should be easy to awaken from sleeping. Sometimes it is necessary for you to stay in the emergency department for a while for observation. Sometimes  admission to the hospital may be needed. After injuries such as yours, most problems occur within the first 24 hours, but side effects may occur up to 7-10 days after the injury. It is important for you to carefully monitor your condition and contact your health care provider or seek immediate medical care if there is a change in your condition. °WHAT ARE THE TYPES OF HEAD INJURIES? °Head injuries can be as minor as a bump. Some head injuries can be more severe. More severe head injuries include: °· A jarring injury to the brain (concussion). °· A bruise of the brain (contusion). This mean there is bleeding in the brain that can cause swelling. °· A cracked skull (skull fracture). °· Bleeding in the brain that collects, clots, and forms a bump (hematoma). °WHAT CAUSES A HEAD INJURY? °A serious head injury is most likely to happen to someone who is in a car wreck and is not wearing a seat belt. Other causes of major head injuries include bicycle or motorcycle accidents, sports injuries, and falls. °HOW ARE HEAD INJURIES DIAGNOSED? °A complete history of the event leading to the injury and your current symptoms will be helpful in diagnosing head injuries. Many times, pictures of the brain, such as CT or MRI are needed to see the extent of the injury. Often, an overnight hospital stay is necessary for observation.  °WHEN SHOULD I SEEK IMMEDIATE MEDICAL CARE?  °You should get help right away if: °· You have confusion or drowsiness. °· You feel sick to your stomach (nauseous) or have continued, forceful vomiting. °· You have dizziness or unsteadiness that is getting worse. °· You have severe, continued headaches not relieved by medicine. Only take over-the-counter or prescription medicines for pain, fever, or discomfort as directed by your health care provider. °· You do not have normal function of the arms or legs or are unable to walk. °· You notice changes in the black spots in the center of the colored part of your  eye (pupil). °· You have a clear or bloody fluid coming from your nose or ears. °· You have a loss of vision. °During the next 24 hours after the injury, you must stay with someone who can watch you for the warning signs. This person should contact local emergency services (911 in the [place] if you have seizures, you become unconscious, or you are unable to wake up. °HOW CAN I PREVENT A HEAD INJURY IN THE FUTURE? °The most important factor for preventing major head injuries is avoiding motor vehicle accidents.  To minimize the potential for damage to your head, it is crucial to wear seat belts while riding in motor vehicles. Wearing helmets while bike riding and playing collision sports (like football) is also helpful. Also, avoiding dangerous activities around the house will further help reduce your risk of head injury.  °WHEN CAN I RETURN TO NORMAL ACTIVITIES AND ATHLETICS? °You should be reevaluated by your health care provider before returning to these activities. If you have any   of the following symptoms, you should not return to activities or contact sports until 1 week after the symptoms have stopped: °· Persistent headache. °· Dizziness or vertigo. °· Poor attention and concentration. °· Confusion. °· Memory problems. °· Nausea or vomiting. °· Fatigue or tire easily. °· Irritability. °· Intolerant of bright lights or loud noises. °· Anxiety or depression. °· Disturbed sleep. °MAKE SURE YOU:  °· Understand these instructions. °· Will watch your condition. °· Will get help right away if you are not doing well or get worse. °Document Released: 04/05/2005 Document Revised: 04/10/2013 Document Reviewed: 12/11/2012 °ExitCare® Patient Information ©2015 [employer]. This information is not intended to replace advice given to you by your health care provider. Make sure you discuss any questions you have with your health care provider. ° °

## 2014-12-31 NOTE — ED Notes (Signed)
Pt c/o fall on Friday; pt has bruising to right side of face; pt sent here for CT scan by PCP; pt at baseline with caregiver at present

## 2014-12-31 NOTE — Progress Notes (Signed)
Established Carotid Patient   History of Present Illness  Zachary [name] is a 81 y.o. male patient of Dr. [name] who is s/p right CEA 05/25/13.  He returns today for f/u carotid duplex scan. Pt denies amaurosis fugax, paresthesias, or hemiparesis. He states that he was in the hospital in April of 2015 and was told that he may have had had a stroke but states he does not recall any stroke symptoms.  Review of records: 05/22/2013 MRI of the brain: Acute posterior right frontal infarction, moderate small vessel disease, and moderate atrophy. No acute cerebral infarct since then on review of records.   He continues to have generalized weakness, otherwise has done well. He does have some blurry vision bilaterally.   He is on a statin for his cholesterol. He is on a beta blocker for his hypertension.   He has had bilateral cataract extraction with lens implants. Dr. Wells Guiles Tat, neurologist,  01/07/14 progress notes exam: "The right pupil is surgical and not reactive. The left pupil is round and nonreactive. Funduscopic exam reveals a clear disc margin on the right. No red reflex is even identified on the left and cannot view fundus on t"he left. Extraocular muscles are intact".  He found himself on his bathroom floor 3 days ago, does not recall falling or passing out. Now has a hematoma on the right side of his forehead and bruising around his right orbit. His caregiver next saw him yesterday and saw the right orbit bruising, states she advised pt to let her take him to his doctor and pt states he declined.  Pt states he fell one other time recently and did not hurt himself with that fall. He states his legs gave way.  The patient denies new surgical history.  Pt Diabetic: no Pt smoker: former smoker, quit in 1964  Pt meds include: Statin : yes ASA: no Other anticoagulants/antiplatelets: no   Past Medical History  Diagnosis Date  . Prostate cancer 2000  . PVD (peripheral vascular  disease)   . Prostate enlargement   . Insomnia   . Pancreatitis   . PVC's (premature ventricular contractions)   . Heart block   . Diabetes mellitus without complication   . Carotid artery occlusion   . Hypertension   . Stroke     Social History Social History  Substance Use Topics  . Smoking status: Former Smoker -- 20 years    Quit date: 04/19/1962  . Smokeless tobacco: Never Used  . Alcohol Use: No    Family History Family History  Problem Relation Age of Onset  . Breast cancer Daughter   . Cancer Daughter   . Stroke Father   . Other      hypogonadism    Surgical History Past Surgical History  Procedure Laterality Date  . Cholecystectomy  07/18/2011    Procedure: LAPAROSCOPIC CHOLECYSTECTOMY;  Surgeon: Gwenyth Ober, MD;  Location: [place];  Service: General;  Laterality: N/A;  . Prostatectomy    . Endarterectomy Right 05/28/2013    Procedure: ENDARTERECTOMY CAROTID;  Surgeon: Rosetta Posner, MD;  Location: [facility] OR;  Service: Vascular;  Laterality: Right;  . Cataract extraction w/ intraocular lens  implant, bilateral    . Flexible sigmoidoscopy N/A 09/23/2014    Procedure: FLEXIBLE SIGMOIDOSCOPY;  Surgeon: Garlan Fair, MD;  Location: [facility];  Service: Endoscopy;  Laterality: N/A;    Allergies  Allergen Reactions  . Penicillins Rash    Current Outpatient Prescriptions  Medication Sig  Dispense Refill  . atorvastatin (LIPITOR) 10 MG tablet Take 1 tablet (10 mg total) by mouth daily.    Marland Kitchen donepezil (ARICEPT) 10 MG tablet Take 10 mg by mouth at bedtime.    . ergocalciferol (VITAMIN D2) 50000 UNITS capsule Take 50,000 Units by mouth once a week. Friday    . glimepiride (AMARYL) 1 MG tablet Take 1 mg by mouth daily before breakfast.    . Memantine HCl ER (NAMENDA XR) 28 MG CP24 Take by mouth.    . metoprolol tartrate (LOPRESSOR) 25 MG tablet Take 0.5 tablets (12.5 mg total) by mouth 2 (two) times daily.    . Multiple Vitamin (MULTIVITAMIN WITH MINERALS) TABS Take  1 tablet by mouth daily.    Marland Kitchen omeprazole (PRILOSEC) 20 MG capsule Take 20 mg by mouth daily.    . sertraline (ZOLOFT) 50 MG tablet Take 50 mg by mouth daily.    . Tamsulosin HCl (FLOMAX) 0.4 MG CAPS Take 0.4 mg by mouth daily.     . Testosterone 20.25 MG/ACT (1.62%) GEL Place 1 Squirt onto the skin daily. (Patient not taking: Reported on 12/31/2014) 75 g 0   No current facility-administered medications for this visit.    Review of Systems : See HPI for pertinent positives and negatives.  Physical Examination  Filed Vitals:   12/31/14 1148 12/31/14 1149  BP: 113/67 99/53  Pulse: 52 51  Temp: 97.1 F (36.2 C)   Resp: 14   Height: 5\' 10"  (1.778 m)   Weight: 189 lb (85.73 kg)   SpO2: 94%    Body mass index is 27.12 kg/(m^2).  General: WDWN male in NAD GAIT: using cane, slow and deliberate Eyes: right pupil is larger than left pupil and is irregular in shape, is non reactive, left pupil is round and constricts to light. Bruising around right orbit. Hematoma at right side of forehead. Pulmonary:  Non-labored, CTAB, no rales, no rhonchi, & no wheezing.  Cardiac: regular rhythm,  no detected murmur.  VASCULAR EXAM Carotid Bruits Right Left   Negative Negative    Aorta is not palpable. Radial pulses are 2+ palpable and equal.                                                                                                                            LE Pulses Right Left       FEMORAL   palpable  not palpable        POPLITEAL  not palpable   not palpable       POSTERIOR TIBIAL  not palpable   not palpable        DORSALIS PEDIS      ANTERIOR TIBIAL not palpable  not palpable     Gastrointestinal: soft, nontender, BS WNL, no r/g,  no palpable masses.  Musculoskeletal: Negative muscle atrophy/wasting. M/S 5/5 throughout, extremities without ischemic changes.  Neurologic: A&O X 3; Appropriate Affect, Speech is normal CN 2-12 intact except right pupil  is irregular in shape and  nonreactive, pain and light touch intact in extremities except mildly decreased sensation right facial cheek, Motor exam as listed above.   Non-Invasive Vascular Imaging CAROTID DUPLEX 12/31/2014   CEREBROVASCULAR DUPLEX EVALUATION    INDICATION: Carotid stenosis    PREVIOUS INTERVENTION(S): Right carotid endarterectomy 05/25/2013    DUPLEX EXAM:     RIGHT  LEFT  Peak Systolic Velocities (cm/s) End Diastolic Velocities (cm/s) Plaque LOCATION Peak Systolic Velocities (cm/s) End Diastolic Velocities (cm/s) Plaque  149 16  CCA PROXIMAL 95 13   121 16 HT CCA MID 117 20 CP  102 11 HT CCA DISTAL 157 22 HT  109 0  ECA 187 0   53 10 HM ICA PROXIMAL 168 35 HT  66 17  ICA MID 130 28   69 15  ICA DISTAL 101 19     carotid endarterectomy ICA / CCA Ratio (PSV) 1.44  Antegrade Vertebral Flow Antegrade  98 Brachial Systolic Pressure (mmHg) 98  Triphasic Brachial Artery Waveforms Triphasic    Plaque Morphology:  HM = Homogeneous, HT = Heterogeneous, CP = Calcific Plaque, SP = Smooth Plaque, IP = Irregular Plaque     ADDITIONAL FINDINGS: Right subclavian artery PSV101cm/sec; Left subclavian artery PSV177cm/sec    IMPRESSION: Bilateral common carotid artery disease present of less than 50%. Right internal carotid artery is patent with history of carotid endarterectomy, mild heterogeneous plaque present suggestive of less than 40% stenosis. Left internal carotid artery stenosis present in the less than 40% range. Vertebral arteries are patent and antegrade at imaged segments.    Compared to the previous exam:  Essentially unchanged since previous study on 12/25/2013.      Assessment: LESEAN WOOLVERTON is a 81 y.o. male who is s/p right CEA 05/25/13. 05/22/2013 MRI of the brain: Acute posterior right frontal infarction, moderate small vessel disease, and moderate atrophy. No acute cerebral infarct since then on review of records.  He has a caretaker with him. He is alert and oriented x3. He  found himself on the floor of his bathroom 3 days ago, does not recall falling. He was not medically evaluated after this fall. He has a hematoma on the right side of his forehead and bruising around his right orbit. His right pupil is irregular in shape and non reactive to light, but his neurologist exam note from 01/07/14 indicates right pupil is surgical and not reactive. He has a history of cataract extraction with IOL implant.   Today's carotid Duplex suggests bilateral common carotid artery disease present of less than 50%. Right internal carotid artery is patent with history of carotid endarterectomy, mild heterogeneous plaque present suggestive of less than 40% stenosis. Left internal carotid artery stenosis present in the less than 40% range. Vertebral arteries are patent and antegrade at imaged segments. Essentially unchanged since previous study on 12/25/2013.   Spoke with Zachary [name], triage nurse at [facility] ED and gave a brief report as to reason pt is coming there by private vehicle for evaluation: he has not been medically evaluated since his fall 3 days ago, he hit his head and has a hematoma on his forehead with bruising around his right orbit, and his right pupil is irregular in shape and non reactive.  Face to face time with patient was 25 minutes. Over 50% of this time was spent on counseling and coordination of care.    Plan:  Discussed with Dr. Bridgett Larsson pt's fall 3 days ago, syncope,  right orbit bruising,  right forehead hematoma, irregular and non reactive right pupil, and that pt has not been medically evaluated re his fall. Pt's caregiver will drive him to [facility] ED for evaluation and treatment. Follow-up in 1 year with Carotid Duplex.   I discussed in depth with the patient the nature of atherosclerosis, and emphasized the importance of maximal medical management including strict control of blood pressure, blood glucose, and lipid levels, obtaining regular exercise,  and continued cessation of smoking.  The patient is aware that without maximal medical management the underlying atherosclerotic disease process will progress, limiting the benefit of any interventions. The patient was given information about stroke prevention and what symptoms should prompt the patient to seek immediate medical care. Thank you for allowing [place] to participate in this patient's care.  Clemon Chambers, RN, MSN, FNP-C [facility] Office: [facility] Physician: Bridgett Larsson  12/31/2014 11:55 AM    VASCULAR QUALITY INITIATIVE FOLLOW UP DATA:  Current smoker: [  ] yes  [ X ] no  Living status: [ X ]  Home  [  ] Nursing home  [  ] Homeless    MEDS:  ASA [  ] yes  [ X ] no- [  ] medical reason  Valu.Nieves  ] non compliant  STATIN  Valu.Nieves  ] yes  [  ] no- [  ] medical reason  [  ] non compliant  Beta blocker Valu.Nieves  ] yes  [  ] no- [  ] medical reason  [  ] non compliant  ACE inhibitor [  ] yes  [ X ] no- [ X ] medical reason  [  ] non compliant  P2Y12 Antagonist [ X ] none  [  ] clopidogrel-Plavix  [  ] ticlopidine-Ticlid   [  ] prasugrel-Effient  [  ] ticagrelor- Brilinta    Anticoagulant Valu.Nieves  ] None  [  ] warfarin  [  ] rivaroxaban-Xarelto [  ] dabigatran- Pradaxa  Neurologic event since D/C:  [ X ] no  [  ] yes: [  ] eye event  [  ] cortical event  [  ] VB event  [  ] non specific event  [  ] right  [  ] left  [  ] TIA  [  ] stroke  Date:   Modified Rankin Score: 3

## 2014-12-31 NOTE — Patient Instructions (Signed)
Stroke Prevention Some medical conditions and behaviors are associated with an increased chance of having a stroke. You may prevent a stroke by making healthy choices and managing medical conditions. HOW CAN I REDUCE MY RISK OF HAVING A STROKE?   Stay physically active. Get at least 30 minutes of activity on most or all days.  Do not smoke. It may also be helpful to avoid exposure to secondhand smoke.  Limit alcohol use. Moderate alcohol use is considered to be:  No more than 2 drinks per day for men.  No more than 1 drink per day for nonpregnant women.  Eat healthy foods. This involves:  Eating 5 or more servings of fruits and vegetables a day.  Making dietary changes that address high blood pressure (hypertension), high cholesterol, diabetes, or obesity.  Manage your cholesterol levels.  Making food choices that are high in fiber and low in saturated fat, trans fat, and cholesterol may control cholesterol levels.  Take any prescribed medicines to control cholesterol as directed by your health care provider.  Manage your diabetes.  Controlling your carbohydrate and sugar intake is recommended to manage diabetes.  Take any prescribed medicines to control diabetes as directed by your health care provider.  Control your hypertension.  Making food choices that are low in salt (sodium), saturated fat, trans fat, and cholesterol is recommended to manage hypertension.  Take any prescribed medicines to control hypertension as directed by your health care provider.  Maintain a healthy weight.  Reducing calorie intake and making food choices that are low in sodium, saturated fat, trans fat, and cholesterol are recommended to manage weight.  Stop drug abuse.  Avoid taking birth control pills.  Talk to your health care provider about the risks of taking birth control pills if you are over 35 years old, smoke, get migraines, or have ever had a blood clot.  Get evaluated for sleep  disorders (sleep apnea).  Talk to your health care provider about getting a sleep evaluation if you snore a lot or have excessive sleepiness.  Take medicines only as directed by your health care provider.  For some people, aspirin or blood thinners (anticoagulants) are helpful in reducing the risk of forming abnormal blood clots that can lead to stroke. If you have the irregular heart rhythm of atrial fibrillation, you should be on a blood thinner unless there is a good reason you cannot take them.  Understand all your medicine instructions.  Make sure that other conditions (such as anemia or atherosclerosis) are addressed. SEEK IMMEDIATE MEDICAL CARE IF:   You have sudden weakness or numbness of the face, arm, or leg, especially on one side of the body.  Your face or eyelid droops to one side.  You have sudden confusion.  You have trouble speaking (aphasia) or understanding.  You have sudden trouble seeing in one or both eyes.  You have sudden trouble walking.  You have dizziness.  You have a loss of balance or coordination.  You have a sudden, severe headache with no known cause.  You have new chest pain or an irregular heartbeat. Any of these symptoms may represent a serious problem that is an emergency. Do not wait to see if the symptoms will go away. Get medical help at once. Call your local emergency services (911 in [place] Do not drive yourself to the hospital. Document Released: 05/13/2004 Document Revised: 08/20/2013 Document Reviewed: 10/06/2012 [employer] Patient Information 2015 [employer]. This information is not intended to replace advice given   to you by your health care provider. Make sure you discuss any questions you have with your health care provider.  

## 2014-12-31 NOTE — ED Provider Notes (Signed)
CSN: [id number]     Arrival date & time 12/31/14  1301 History   First MD Initiated Contact with Patient 12/31/14 1704     Chief Complaint  Patient presents with  . Fall     (Consider location/radiation/quality/duration/timing/severity/associated sxs/prior Treatment) HPI Patient fell in his bathroom 3 days ago. He reports that he doesn't know why or how he fell. He was able to get back up by himself and has been asymptomatic all weekend. He denies chest pain, palpitation, dyspnea. He reports he has been eating normally. He has not had any changes in his gait. He reports the side of the spaces been sore. He reports a lot of the bruising developed about 2 days after his fall. He did not think he needed evaluation after this episode, he was seen at his vascular specialist today for scheduled appointment and they devised he comes emergency department for diagnostic testing. He reports a number of weeks ago he had diarrhea that lasted for several weeks. That has improved significantly. He reports now he alternates between constipation and diarrhea periodically. Past Medical History  Diagnosis Date  . Prostate cancer 2000  . PVD (peripheral vascular disease)   . Prostate enlargement   . Insomnia   . Pancreatitis   . PVC's (premature ventricular contractions)   . Heart block   . Diabetes mellitus without complication   . Carotid artery occlusion   . Hypertension   . Stroke    Past Surgical History  Procedure Laterality Date  . Cholecystectomy  07/18/2011    Procedure: LAPAROSCOPIC CHOLECYSTECTOMY;  Surgeon: Gwenyth Ober, MD;  Location: [facility];  Service: General;  Laterality: N/A;  . Prostatectomy    . Endarterectomy Right 05/28/2013    Procedure: ENDARTERECTOMY CAROTID;  Surgeon: Rosetta Posner, MD;  Location: [facility] OR;  Service: Vascular;  Laterality: Right;  . Cataract extraction w/ intraocular lens  implant, bilateral    . Flexible sigmoidoscopy N/A 09/23/2014    Procedure: FLEXIBLE  SIGMOIDOSCOPY;  Surgeon: Garlan Fair, MD;  Location: [facility];  Service: Endoscopy;  Laterality: N/A;   Family History  Problem Relation Age of Onset  . Breast cancer Daughter   . Cancer Daughter   . Stroke Father   . Other      hypogonadism   Social History  Substance Use Topics  . Smoking status: Former Smoker -- 20 years    Quit date: 04/19/1962  . Smokeless tobacco: Never Used  . Alcohol Use: No    Review of Systems  10 Systems reviewed and are negative for acute change except as noted in the HPI.  Allergies  Penicillins  Home Medications   Prior to Admission medications   Medication Sig Start Date End Date Taking? Authorizing Provider  atorvastatin (LIPITOR) 10 MG tablet Take 1 tablet (10 mg total) by mouth daily. 05/30/13   Carlena Sax, MD  donepezil (ARICEPT) 10 MG tablet Take 10 mg by mouth at bedtime.    Historical Provider, MD  ergocalciferol (VITAMIN D2) 50000 UNITS capsule Take 50,000 Units by mouth once a week. Friday    Historical Provider, MD  glimepiride (AMARYL) 1 MG tablet Take 1 mg by mouth daily before breakfast.    Historical Provider, MD  Memantine HCl ER (NAMENDA XR) 28 MG CP24 Take by mouth.    Historical Provider, MD  metoprolol tartrate (LOPRESSOR) 25 MG tablet Take 0.5 tablets (12.5 mg total) by mouth 2 (two) times daily. 05/25/13   Carlena Sax, MD  Multiple  Vitamin (MULTIVITAMIN WITH MINERALS) TABS Take 1 tablet by mouth daily.    Historical Provider, MD  omeprazole (PRILOSEC) 20 MG capsule Take 20 mg by mouth daily.    Historical Provider, MD  sertraline (ZOLOFT) 50 MG tablet Take 50 mg by mouth daily.    Historical Provider, MD  Tamsulosin HCl (FLOMAX) 0.4 MG CAPS Take 0.4 mg by mouth daily.     Historical Provider, MD  Testosterone 20.25 MG/ACT (1.62%) GEL Place 1 Squirt onto the skin daily. Patient not taking: Reported on 12/31/2014 10/15/14   Renato Shin, MD   BP 199/58 mmHg  Pulse 50  Temp(Src) 97.7 F (36.5 C) (Oral)  Resp 20  Ht  5\' 10"  (1.778 m)  Wt 196 lb (88.905 kg)  BMI 28.12 kg/m2  SpO2 98% Physical Exam  Constitutional: He is oriented to person, place, and time. He appears well-developed and well-nourished.  HENT:  Mouth/Throat: Oropharynx is clear and moist.  Patient has large violaceous ecchymosis in the) orbital region. There is also a hematoma on the right brow. These also are resolving with yellowish and greenish color changes. There are no lacerations or open wounds.  Eyes: EOM are normal. Pupils are equal, round, and reactive to light.  Neck: Neck supple.  Cardiovascular: Normal rate, regular rhythm, normal heart sounds and intact distal pulses.   Bradycardia, regular  Pulmonary/Chest: Effort normal and breath sounds normal.  Abdominal: Soft. Bowel sounds are normal. He exhibits no distension. There is no tenderness.  Musculoskeletal: Normal range of motion. He exhibits no edema.  Neurological: He is alert and oriented to person, place, and time. He has normal strength. No cranial nerve deficit. He exhibits normal muscle tone. Coordination normal. GCS eye subscore is 4. GCS verbal subscore is 5. GCS motor subscore is 6.  Skin: Skin is warm, dry and intact.  Psychiatric: He has a normal mood and affect.    ED Course  Procedures (including critical care time) Labs Review Labs Reviewed - No data to display  Imaging Review Ct Head Wo Contrast  12/31/2014   CLINICAL DATA:  81 year old male with fall 4 days ago, acute right facial and head bruising, pain and cervical spine pain. Initial encounter.  EXAM: CT HEAD WITHOUT CONTRAST  CT MAXILLOFACIAL WITHOUT CONTRAST  CT CERVICAL SPINE WITHOUT CONTRAST  TECHNIQUE: Multidetector CT imaging of the head, cervical spine, and maxillofacial structures were performed using the standard protocol without intravenous contrast. Multiplanar CT image reconstructions of the cervical spine and maxillofacial structures were also generated.  COMPARISON:  05/23/2013 head CT   FINDINGS: CT HEAD FINDINGS  Cerebral and cerebellar atrophy and chronic small-vessel white matter ischemic changes are again identified.  No acute intracranial abnormalities are identified, including mass lesion or mass effect, hydrocephalus, extra-axial fluid collection, midline shift, hemorrhage, or acute infarction. The visualized bony calvarium is unremarkable.  A small right anterior scalp hematoma is noted.  CT MAXILLOFACIAL FINDINGS  There is no evidence of acute fracture, subluxation or dislocation.  The paranasal sinuses and mastoid air cells and middle/ inner ears are clear.  No focal bony lesions are identified. The orbits and globes are unremarkable.  No soft tissue abnormalities are identified.  CT CERVICAL SPINE FINDINGS  Normal alignment is noted.  There is no evidence of acute fracture, subluxation or prevertebral soft tissue swelling.  Fusion changes at C5-6 identified.  Moderate multilevel degenerative disc disease, spondylosis and facet arthropathy throughout the cervical spine identified contributing to central spinal and foraminal narrowing at several levels  with moderate-severe central spinal narrowing at C5-6.  IMPRESSION: No evidence of acute intracranial abnormality or static evidence of acute injury to the face or cervical spine.  Small right anterior scalp hematoma without fracture.  Multilevel degenerative changes within the cervical spine.   Electronically Signed   By: Margarette Canada M.D.   On: 12/31/2014 15:56   Ct Cervical Spine Wo Contrast  12/31/2014   CLINICAL DATA:  81 year old male with fall 4 days ago, acute right facial and head bruising, pain and cervical spine pain. Initial encounter.  EXAM: CT HEAD WITHOUT CONTRAST  CT MAXILLOFACIAL WITHOUT CONTRAST  CT CERVICAL SPINE WITHOUT CONTRAST  TECHNIQUE: Multidetector CT imaging of the head, cervical spine, and maxillofacial structures were performed using the standard protocol without intravenous contrast. Multiplanar CT image  reconstructions of the cervical spine and maxillofacial structures were also generated.  COMPARISON:  05/23/2013 head CT  FINDINGS: CT HEAD FINDINGS  Cerebral and cerebellar atrophy and chronic small-vessel white matter ischemic changes are again identified.  No acute intracranial abnormalities are identified, including mass lesion or mass effect, hydrocephalus, extra-axial fluid collection, midline shift, hemorrhage, or acute infarction. The visualized bony calvarium is unremarkable.  A small right anterior scalp hematoma is noted.  CT MAXILLOFACIAL FINDINGS  There is no evidence of acute fracture, subluxation or dislocation.  The paranasal sinuses and mastoid air cells and middle/ inner ears are clear.  No focal bony lesions are identified. The orbits and globes are unremarkable.  No soft tissue abnormalities are identified.  CT CERVICAL SPINE FINDINGS  Normal alignment is noted.  There is no evidence of acute fracture, subluxation or prevertebral soft tissue swelling.  Fusion changes at C5-6 identified.  Moderate multilevel degenerative disc disease, spondylosis and facet arthropathy throughout the cervical spine identified contributing to central spinal and foraminal narrowing at several levels with moderate-severe central spinal narrowing at C5-6.  IMPRESSION: No evidence of acute intracranial abnormality or static evidence of acute injury to the face or cervical spine.  Small right anterior scalp hematoma without fracture.  Multilevel degenerative changes within the cervical spine.   Electronically Signed   By: Margarette Canada M.D.   On: 12/31/2014 15:56   Ct Maxillofacial Wo Cm  12/31/2014   CLINICAL DATA:  81 year old male with fall 4 days ago, acute right facial and head bruising, pain and cervical spine pain. Initial encounter.  EXAM: CT HEAD WITHOUT CONTRAST  CT MAXILLOFACIAL WITHOUT CONTRAST  CT CERVICAL SPINE WITHOUT CONTRAST  TECHNIQUE: Multidetector CT imaging of the head, cervical spine, and  maxillofacial structures were performed using the standard protocol without intravenous contrast. Multiplanar CT image reconstructions of the cervical spine and maxillofacial structures were also generated.  COMPARISON:  05/23/2013 head CT  FINDINGS: CT HEAD FINDINGS  Cerebral and cerebellar atrophy and chronic small-vessel white matter ischemic changes are again identified.  No acute intracranial abnormalities are identified, including mass lesion or mass effect, hydrocephalus, extra-axial fluid collection, midline shift, hemorrhage, or acute infarction. The visualized bony calvarium is unremarkable.  A small right anterior scalp hematoma is noted.  CT MAXILLOFACIAL FINDINGS  There is no evidence of acute fracture, subluxation or dislocation.  The paranasal sinuses and mastoid air cells and middle/ inner ears are clear.  No focal bony lesions are identified. The orbits and globes are unremarkable.  No soft tissue abnormalities are identified.  CT CERVICAL SPINE FINDINGS  Normal alignment is noted.  There is no evidence of acute fracture, subluxation or prevertebral soft tissue swelling.  Fusion  changes at C5-6 identified.  Moderate multilevel degenerative disc disease, spondylosis and facet arthropathy throughout the cervical spine identified contributing to central spinal and foraminal narrowing at several levels with moderate-severe central spinal narrowing at C5-6.  IMPRESSION: No evidence of acute intracranial abnormality or static evidence of acute injury to the face or cervical spine.  Small right anterior scalp hematoma without fracture.  Multilevel degenerative changes within the cervical spine.   Electronically Signed   By: Margarette Canada M.D.   On: 12/31/2014 15:56   I have personally reviewed and evaluated these images and lab results as part of my medical decision-making.   EKG Interpretation None      MDM   Final diagnoses:  Fall, initial encounter  Facial hematoma, initial encounter    Patient is alert and well appearance. His fall occurred 3 days ago. At this time he has CT that shows no intracranial bleeding. His mental status has remained clear. He has not experienced any symptoms of chest pain, dyspnea, palpitations. Appetite and baseline function remains the same. At this time I difficulty is safe for discharge and continued outpatient follow-up with his primary provider.    Charlesetta Shanks, MD 12/31/14 [phone]

## 2014-12-31 NOTE — ED Notes (Signed)
Pt verbalized understanding of discharge instructions. A/o x4. Denies pain. Nad.

## 2015-01-02 ENCOUNTER — Other Ambulatory Visit (HOSPITAL_COMMUNITY)
Admission: RE | Admit: 2015-01-02 | Discharge: 2015-01-02 | Disposition: A | Discharge status: Home / Self Care | Payer: Medicare Other | Source: Ambulatory Visit | Attending: Hematology | Admitting: Hematology

## 2015-01-02 ENCOUNTER — Telehealth: Payer: Self-pay | Admitting: Hematology

## 2015-01-02 ENCOUNTER — Ambulatory Visit (HOSPITAL_BASED_OUTPATIENT_CLINIC_OR_DEPARTMENT_OTHER): Payer: Medicare Other | Admitting: Hematology

## 2015-01-02 ENCOUNTER — Other Ambulatory Visit (HOSPITAL_BASED_OUTPATIENT_CLINIC_OR_DEPARTMENT_OTHER): Payer: Medicare Other

## 2015-01-02 ENCOUNTER — Other Ambulatory Visit: Payer: Self-pay | Admitting: *Deleted

## 2015-01-02 ENCOUNTER — Encounter: Payer: Self-pay | Admitting: Hematology

## 2015-01-02 ENCOUNTER — Ambulatory Visit: Payer: Medicare Other

## 2015-01-02 VITALS — BP 97/44 | HR 57 | Temp 98.2°F | Resp 18 | Ht 70.0 in | Wt 188.4 lb

## 2015-01-02 DIAGNOSIS — I6523 Occlusion and stenosis of bilateral carotid arteries: Secondary | ICD-10-CM | POA: Diagnosis not present

## 2015-01-02 DIAGNOSIS — D72829 Elevated white blood cell count, unspecified: Secondary | ICD-10-CM

## 2015-01-02 LAB — COMPREHENSIVE METABOLIC PANEL (CC13)
ALT: 30 U/L (ref 0–55)
AST: 26 U/L (ref 5–34)
Albumin: 3.5 g/dL (ref 3.5–5.0)
Alkaline Phosphatase: 80 U/L (ref 40–150)
Anion Gap: 6 mEq/L (ref 3–11)
BUN: 15.6 mg/dL (ref 7.0–26.0)
CALCIUM: 9 mg/dL (ref 8.4–10.4)
CO2: 31 mEq/L — ABNORMAL HIGH (ref 22–29)
Chloride: 107 mEq/L (ref 98–109)
Creatinine: 1.2 mg/dL (ref 0.7–1.3)
EGFR: 55 mL/min/{1.73_m2} — AB (ref >90)
GLUCOSE: 125 mg/dL (ref 70–140)
POTASSIUM: 4 meq/L (ref 3.5–5.1)
SODIUM: 143 meq/L (ref 136–145)
Total Bilirubin: 0.41 mg/dL (ref 0.20–1.20)
Total Protein: 6.6 g/dL (ref 6.4–8.3)

## 2015-01-02 LAB — CBC WITH DIFFERENTIAL/PLATELET
BASO%: 0.2 % (ref 0.0–2.0)
BASOS ABS: 0 10*3/uL (ref 0.0–0.1)
EOS ABS: 0.3 10*3/uL (ref 0.0–0.5)
EOS%: 2.3 % (ref 0.0–7.0)
HCT: 41.5 % (ref 38.4–49.9)
HGB: 14.1 g/dL (ref 13.0–17.1)
LYMPH%: 26.9 % (ref 14.0–49.0)
MCH: 31.8 pg (ref 27.2–33.4)
MCHC: 34 g/dL (ref 32.0–36.0)
MCV: 93.7 fL (ref 79.3–98.0)
MONO#: 1.3 10*3/uL — ABNORMAL HIGH (ref 0.1–0.9)
MONO%: 10 % (ref 0.0–14.0)
NEUT#: 7.8 10*3/uL — ABNORMAL HIGH (ref 1.5–6.5)
NEUT%: 60.6 % (ref 39.0–75.0)
Platelets: 253 10*3/uL (ref 140–400)
RBC: 4.43 10*6/uL (ref 4.20–5.82)
RDW: 13.2 % (ref 11.0–14.6)
WBC: 12.9 10*3/uL — ABNORMAL HIGH (ref 4.0–10.3)
lymph#: 3.5 10*3/uL — ABNORMAL HIGH (ref 0.9–3.3)

## 2015-01-02 NOTE — Progress Notes (Signed)
[place]  [phone] [phone] [place] consult Note   Patient Care Team: Lottie Dawson, MD as PCP - General (Internal Medicine) Lauree Chandler, NP as Nurse Practitioner (Nurse Practitioner) 01/02/2015  REFERRAL PHYSICIAN: Shamleffer, Herschell Dimes, MD  CHIEF COMPLAINTS/PURPOSE OF CONSULTATION:  LEUKOCYTOSIS  HISTORY OF PRESENTING ILLNESS:  Zachary [name] 81 y.o. male is here because of mild leukocytosis.  This was found on the routine lab. According to his outside medical records, he has had WBC 11-20K since 11/2008. I do not have his lab results prior to 2010. Predominantly neutrophil and a slightly increased lymph cells with absolutely for counts 2.9-4.7K. his hemoglobin and platelet count has been normal. He denies recurrent infection, or bleeding.  He has been feeling quite fatigued since he had stoke in Feb 2015 one year ago, he felt trained after mild exertion. He is able to walk for very short distance, and needs to sit down frequently . He denies ay pain, no nausea, he has dizziness, especially when he stands up. He had a fall at his bathroom 2 days ago, which resulted some bruises on the right scalp and below his right eye. He was seen at the emergency room yesterday, no fracture. He states his appetite is fine, he is well, no significant weight loss.  He lives with his wife, who is 73 year old and independent. He has a private caregiver, who states with them 4 hours a day, and she accompanied him to the clinic today. He needs assistance for shower and dressing.  No fever night sweats   MEDICAL HISTORY:  Past Medical History  Diagnosis Date  . PVD (peripheral vascular disease)   . Prostate enlargement   . Insomnia   . Pancreatitis   . PVC's (premature ventricular contractions)   . Heart block   . Diabetes mellitus without complication   . Carotid artery occlusion   . Hypertension   . Stroke     SURGICAL  HISTORY: Past Surgical History  Procedure Laterality Date  . Cholecystectomy  07/18/2011    Procedure: LAPAROSCOPIC CHOLECYSTECTOMY;  Surgeon: Gwenyth Ober, MD;  Location: [facility];  Service: General;  Laterality: N/A;  . Endarterectomy Right 05/28/2013    Procedure: ENDARTERECTOMY CAROTID;  Surgeon: Rosetta Posner, MD;  Location: [facility] OR;  Service: Vascular;  Laterality: Right;  . Cataract extraction w/ intraocular lens  implant, bilateral    . Flexible sigmoidoscopy N/A 09/23/2014    Procedure: FLEXIBLE SIGMOIDOSCOPY;  Surgeon: Garlan Fair, MD;  Location: [facility];  Service: Endoscopy;  Laterality: N/A;    SOCIAL HISTORY: Social History   Social History  . Marital Status: Married    Spouse Name: N/A  . Number of Children: 3, one died from cancer, 5 grandchildren   . Years of Education: N/A   Occupational History  . retired     [place] History Main Topics  . Smoking status: Former Smoker -- 20 years    Quit date: 04/19/1962  . Smokeless tobacco: Never Used  . Alcohol Use: No  . Drug Use: No  . Sexual Activity: Not on file   Other Topics Concern  . Not on file   Social History Narrative    FAMILY HISTORY: Family History  Problem Relation Age of Onset  . Breast cancer Daughter   . Cancer Daughter 83    died of cancer   . Stroke Father   . Other  hypogonadism    ALLERGIES:  is allergic to penicillins.  MEDICATIONS:  Current Outpatient Prescriptions  Medication Sig Dispense Refill  . atorvastatin (LIPITOR) 10 MG tablet Take 1 tablet (10 mg total) by mouth daily.    Marland Kitchen donepezil (ARICEPT) 10 MG tablet Take 10 mg by mouth at bedtime.    . ergocalciferol (VITAMIN D2) 50000 UNITS capsule Take 50,000 Units by mouth once a week. Friday    . glimepiride (AMARYL) 1 MG tablet Take 1 mg by mouth daily before breakfast.    . Memantine HCl ER (NAMENDA XR) 28 MG CP24 Take 28 mg by mouth daily.     . metoprolol tartrate (LOPRESSOR) 25 MG tablet Take  0.5 tablets (12.5 mg total) by mouth 2 (two) times daily.    . Multiple Vitamin (MULTIVITAMIN WITH MINERALS) TABS Take 1 tablet by mouth daily.    Marland Kitchen omeprazole (PRILOSEC) 20 MG capsule Take 20 mg by mouth daily.    . sertraline (ZOLOFT) 50 MG tablet Take 50 mg by mouth daily.    . Tamsulosin HCl (FLOMAX) 0.4 MG CAPS Take 0.4 mg by mouth daily.      No current facility-administered medications for this visit.    REVIEW OF SYSTEMS:   Constitutional: Denies fevers, chills or abnormal night sweats Eyes: Denies blurriness of vision, double vision or watery eyes Ears, nose, mouth, throat, and face: Denies mucositis or sore throat Respiratory: Denies cough, dyspnea or wheezes Cardiovascular: Denies palpitation, chest discomfort or lower extremity swelling Gastrointestinal:  Denies nausea, heartburn or change in bowel habits Skin: Denies abnormal skin rashes Lymphatics: Denies new lymphadenopathy or easy bruising Neurological:Denies numbness, tingling or new weaknesses Behavioral/Psych: Mood is stable, no new changes  All other systems were reviewed with the patient and are negative.  PHYSICAL EXAMINATION: ECOG PERFORMANCE STATUS: 3 - Symptomatic, >50% confined to bed  Filed Vitals:   01/02/15 1517  BP: 97/44  Pulse: 57  Temp:   Resp:    Filed Weights   01/02/15 1508  Weight: 188 lb 6.4 oz (85.458 kg)    GENERAL:alert, no distress and comfortable SKIN: skin color, texture, turgor are normal, no rashes or significant lesions, except ecchymosis at the right front scalp and below right eye EYES: normal, conjunctiva are pink and non-injected, sclera clear OROPHARYNX:no exudate, no erythema and lips, buccal mucosa, and tongue normal  NECK: supple, thyroid normal size, non-tender, without nodularity LYMPH:  no palpable lymphadenopathy in the cervical, axillary or inguinal LUNGS: clear to auscultation and percussion with normal breathing effort HEART: regular rate & rhythm and no murmurs  and no lower extremity edema ABDOMEN:abdomen soft, non-tender and normal bowel sounds Musculoskeletal:no cyanosis of digits and no clubbing  PSYCH: alert & oriented x 3 with fluent speech NEURO: no focal motor/sensory deficits  LABORATORY DATA:  I have reviewed the data as listed CBC Latest Ref Rng 01/02/2015 01/21/2014 05/29/2013  WBC 4.0 - 10.3 10e3/uL 12.9(H) 12.1(H) 16.6(H)  Hemoglobin 13.0 - 17.1 g/dL 14.1 14.2 13.8  Hematocrit 38.4 - 49.9 % 41.5 41.9 39.2  Platelets 140 - 400 10e3/uL 253 231 289    CMP Latest Ref Rng 01/02/2015 01/21/2014 05/29/2013  Glucose 70 - 140 mg/dl 125 117(H) 150(H)  BUN 7.0 - 26.0 mg/dL 15.6 17 9   Creatinine 0.7 - 1.3 mg/dL 1.2 1.09 0.85  Sodium 136 - 145 mEq/L 143 141 140  Potassium 3.5 - 5.1 mEq/L 4.0 3.9 4.2  Chloride 96 - 112 mEq/L - 104 104  CO2 22 - 29 mEq/L  31(H) 28 24  Calcium 8.4 - 10.4 mg/dL 9.0 9.2 8.2(L)  Total Protein 6.4 - 8.3 g/dL 6.6 7.0 -  Total Bilirubin 0.20 - 1.20 mg/dL 0.41 0.5 -  Alkaline Phos 40 - 150 U/L 80 75 -  AST 5 - 34 U/L 26 17 -  ALT 0 - 55 U/L 30 12 -     RADIOGRAPHIC STUDIES: I have personally reviewed the radiological images as listed and agreed with the findings in the report. Dg Chest 2 View  12/12/2014   CLINICAL DATA:  Cough, chest congestion, shortness of breath, fatigue  EXAM: CHEST  2 VIEW  COMPARISON:  PA and lateral chest x-ray dated March 20, 2014  FINDINGS: The lungs are adequately inflated. There is no focal infiltrate. There is no pleural effusion. The heart and pulmonary vascularity are normal. The mediastinum is normal in width. The bony thorax exhibits no acute abnormality.  IMPRESSION: There is no active cardiopulmonary disease.   Electronically Signed   By: Gaberial  Martinique M.D.   On: 12/12/2014 13:43   Ct Head Wo Contrast  12/31/2014   CLINICAL DATA:  81 year old male with fall 4 days ago, acute right facial and head bruising, pain and cervical spine pain. Initial encounter.  EXAM: CT HEAD WITHOUT  CONTRAST  CT MAXILLOFACIAL WITHOUT CONTRAST  CT CERVICAL SPINE WITHOUT CONTRAST  TECHNIQUE: Multidetector CT imaging of the head, cervical spine, and maxillofacial structures were performed using the standard protocol without intravenous contrast. Multiplanar CT image reconstructions of the cervical spine and maxillofacial structures were also generated.  COMPARISON:  05/23/2013 head CT  FINDINGS: CT HEAD FINDINGS  Cerebral and cerebellar atrophy and chronic small-vessel white matter ischemic changes are again identified.  No acute intracranial abnormalities are identified, including mass lesion or mass effect, hydrocephalus, extra-axial fluid collection, midline shift, hemorrhage, or acute infarction. The visualized bony calvarium is unremarkable.  A small right anterior scalp hematoma is noted.  CT MAXILLOFACIAL FINDINGS  There is no evidence of acute fracture, subluxation or dislocation.  The paranasal sinuses and mastoid air cells and middle/ inner ears are clear.  No focal bony lesions are identified. The orbits and globes are unremarkable.  No soft tissue abnormalities are identified.  CT CERVICAL SPINE FINDINGS  Normal alignment is noted.  There is no evidence of acute fracture, subluxation or prevertebral soft tissue swelling.  Fusion changes at C5-6 identified.  Moderate multilevel degenerative disc disease, spondylosis and facet arthropathy throughout the cervical spine identified contributing to central spinal and foraminal narrowing at several levels with moderate-severe central spinal narrowing at C5-6.  IMPRESSION: No evidence of acute intracranial abnormality or static evidence of acute injury to the face or cervical spine.  Small right anterior scalp hematoma without fracture.  Multilevel degenerative changes within the cervical spine.   Electronically Signed   By: Margarette Canada M.D.   On: 12/31/2014 15:56   Ct Cervical Spine Wo Contrast  12/31/2014   CLINICAL DATA:  81 year old male with fall 4  days ago, acute right facial and head bruising, pain and cervical spine pain. Initial encounter.  EXAM: CT HEAD WITHOUT CONTRAST  CT MAXILLOFACIAL WITHOUT CONTRAST  CT CERVICAL SPINE WITHOUT CONTRAST  TECHNIQUE: Multidetector CT imaging of the head, cervical spine, and maxillofacial structures were performed using the standard protocol without intravenous contrast. Multiplanar CT image reconstructions of the cervical spine and maxillofacial structures were also generated.  COMPARISON:  05/23/2013 head CT  FINDINGS: CT HEAD FINDINGS  Cerebral and cerebellar atrophy and chronic  small-vessel white matter ischemic changes are again identified.  No acute intracranial abnormalities are identified, including mass lesion or mass effect, hydrocephalus, extra-axial fluid collection, midline shift, hemorrhage, or acute infarction. The visualized bony calvarium is unremarkable.  A small right anterior scalp hematoma is noted.  CT MAXILLOFACIAL FINDINGS  There is no evidence of acute fracture, subluxation or dislocation.  The paranasal sinuses and mastoid air cells and middle/ inner ears are clear.  No focal bony lesions are identified. The orbits and globes are unremarkable.  No soft tissue abnormalities are identified.  CT CERVICAL SPINE FINDINGS  Normal alignment is noted.  There is no evidence of acute fracture, subluxation or prevertebral soft tissue swelling.  Fusion changes at C5-6 identified.  Moderate multilevel degenerative disc disease, spondylosis and facet arthropathy throughout the cervical spine identified contributing to central spinal and foraminal narrowing at several levels with moderate-severe central spinal narrowing at C5-6.  IMPRESSION: No evidence of acute intracranial abnormality or static evidence of acute injury to the face or cervical spine.  Small right anterior scalp hematoma without fracture.  Multilevel degenerative changes within the cervical spine.   Electronically Signed   By: Margarette Canada M.D.    On: 12/31/2014 15:56   Ct Maxillofacial Wo Cm  12/31/2014   CLINICAL DATA:  81 year old male with fall 4 days ago, acute right facial and head bruising, pain and cervical spine pain. Initial encounter.  EXAM: CT HEAD WITHOUT CONTRAST  CT MAXILLOFACIAL WITHOUT CONTRAST  CT CERVICAL SPINE WITHOUT CONTRAST  TECHNIQUE: Multidetector CT imaging of the head, cervical spine, and maxillofacial structures were performed using the standard protocol without intravenous contrast. Multiplanar CT image reconstructions of the cervical spine and maxillofacial structures were also generated.  COMPARISON:  05/23/2013 head CT  FINDINGS: CT HEAD FINDINGS  Cerebral and cerebellar atrophy and chronic small-vessel white matter ischemic changes are again identified.  No acute intracranial abnormalities are identified, including mass lesion or mass effect, hydrocephalus, extra-axial fluid collection, midline shift, hemorrhage, or acute infarction. The visualized bony calvarium is unremarkable.  A small right anterior scalp hematoma is noted.  CT MAXILLOFACIAL FINDINGS  There is no evidence of acute fracture, subluxation or dislocation.  The paranasal sinuses and mastoid air cells and middle/ inner ears are clear.  No focal bony lesions are identified. The orbits and globes are unremarkable.  No soft tissue abnormalities are identified.  CT CERVICAL SPINE FINDINGS  Normal alignment is noted.  There is no evidence of acute fracture, subluxation or prevertebral soft tissue swelling.  Fusion changes at C5-6 identified.  Moderate multilevel degenerative disc disease, spondylosis and facet arthropathy throughout the cervical spine identified contributing to central spinal and foraminal narrowing at several levels with moderate-severe central spinal narrowing at C5-6.  IMPRESSION: No evidence of acute intracranial abnormality or static evidence of acute injury to the face or cervical spine.  Small right anterior scalp hematoma without  fracture.  Multilevel degenerative changes within the cervical spine.   Electronically Signed   By: Margarette Canada M.D.   On: 12/31/2014 15:56    ASSESSMENT & PLAN:  81 year old Caucasian male, with past medical history of diabetes and hypertension, stroke in [place] 2015, has chronic mild leukocytosis for at least 6 years.   1. Mild leukocytosis -This has been chronic, for over 6 years or longer, and his overall white count has been stable, and sometime drops to upper normal limits. -The WBC differential showed slightly elevated neutrophils and lymphocytes, no increased eosinophil or basophils -This  is likely reactive, although he does not have any chronic infection or chronic inflammation -I would like to ruled out CML or mild lymphoproliferative disorder. I'll obtain flow cytometry and BCR/ABL mutation test. -I'll review his peripheral blood smear -If the above test negative, I do not think he needs further workup.  2. Chronic fatigue -I think this is multifactorial, related to his aging, history of stroke, and low testosterone level. -His PSA was normal, recent colonoscopy was negative for malignancy, he does not have any other particular symptoms such as weight loss, concerning for malignancy. I do not feel he needs further workup such as scans.  -He will continue follow-up with his primary care physician  Follow up: -lab today -I will call him about the lab results if it's negative, then he does not follow up with me -If his BCR/ABL or cytometry were abnormal, I'll see him back in a few weeks.  Patient knows to call the clinic with any problems, questions or concerns. I spent 40 minutes counseling the patient face to face. The total time spent in the appointment was 45 minutes and more than 50% was on counseling.     Truitt Merle, MD 01/02/2015 4:17 PM

## 2015-01-02 NOTE — Telephone Encounter (Signed)
per pof to sch pt appt-gave pt copy of avs °

## 2015-01-03 DIAGNOSIS — I69354 Hemiplegia and hemiparesis following cerebral infarction affecting left non-dominant side: Secondary | ICD-10-CM | POA: Diagnosis not present

## 2015-01-03 DIAGNOSIS — E1151 Type 2 diabetes mellitus with diabetic peripheral angiopathy without gangrene: Secondary | ICD-10-CM | POA: Diagnosis not present

## 2015-01-03 DIAGNOSIS — F329 Major depressive disorder, single episode, unspecified: Secondary | ICD-10-CM | POA: Diagnosis not present

## 2015-01-03 DIAGNOSIS — K529 Noninfective gastroenteritis and colitis, unspecified: Secondary | ICD-10-CM | POA: Diagnosis not present

## 2015-01-03 DIAGNOSIS — G20 Parkinson's disease: Secondary | ICD-10-CM | POA: Diagnosis not present

## 2015-01-03 DIAGNOSIS — I70209 Unspecified atherosclerosis of native arteries of extremities, unspecified extremity: Secondary | ICD-10-CM | POA: Diagnosis not present

## 2015-01-05 DIAGNOSIS — Z87891 Personal history of nicotine dependence: Secondary | ICD-10-CM | POA: Diagnosis not present

## 2015-01-05 DIAGNOSIS — I69354 Hemiplegia and hemiparesis following cerebral infarction affecting left non-dominant side: Secondary | ICD-10-CM | POA: Diagnosis not present

## 2015-01-05 DIAGNOSIS — E1151 Type 2 diabetes mellitus with diabetic peripheral angiopathy without gangrene: Secondary | ICD-10-CM | POA: Diagnosis not present

## 2015-01-05 DIAGNOSIS — G20 Parkinson's disease: Secondary | ICD-10-CM | POA: Diagnosis not present

## 2015-01-05 DIAGNOSIS — Z8546 Personal history of malignant neoplasm of prostate: Secondary | ICD-10-CM | POA: Diagnosis not present

## 2015-01-05 DIAGNOSIS — F329 Major depressive disorder, single episode, unspecified: Secondary | ICD-10-CM | POA: Diagnosis not present

## 2015-01-07 DIAGNOSIS — I69354 Hemiplegia and hemiparesis following cerebral infarction affecting left non-dominant side: Secondary | ICD-10-CM | POA: Diagnosis not present

## 2015-01-07 DIAGNOSIS — G20 Parkinson's disease: Secondary | ICD-10-CM | POA: Diagnosis not present

## 2015-01-07 DIAGNOSIS — Z87891 Personal history of nicotine dependence: Secondary | ICD-10-CM | POA: Diagnosis not present

## 2015-01-07 DIAGNOSIS — E1151 Type 2 diabetes mellitus with diabetic peripheral angiopathy without gangrene: Secondary | ICD-10-CM | POA: Diagnosis not present

## 2015-01-07 DIAGNOSIS — Z8546 Personal history of malignant neoplasm of prostate: Secondary | ICD-10-CM | POA: Diagnosis not present

## 2015-01-07 DIAGNOSIS — F329 Major depressive disorder, single episode, unspecified: Secondary | ICD-10-CM | POA: Diagnosis not present

## 2015-01-08 LAB — FLOW CYTOMETRY

## 2015-01-09 DIAGNOSIS — Z87891 Personal history of nicotine dependence: Secondary | ICD-10-CM | POA: Diagnosis not present

## 2015-01-09 DIAGNOSIS — Z8546 Personal history of malignant neoplasm of prostate: Secondary | ICD-10-CM | POA: Diagnosis not present

## 2015-01-09 DIAGNOSIS — E1151 Type 2 diabetes mellitus with diabetic peripheral angiopathy without gangrene: Secondary | ICD-10-CM | POA: Diagnosis not present

## 2015-01-09 DIAGNOSIS — I69354 Hemiplegia and hemiparesis following cerebral infarction affecting left non-dominant side: Secondary | ICD-10-CM | POA: Diagnosis not present

## 2015-01-09 DIAGNOSIS — F329 Major depressive disorder, single episode, unspecified: Secondary | ICD-10-CM | POA: Diagnosis not present

## 2015-01-09 DIAGNOSIS — G20 Parkinson's disease: Secondary | ICD-10-CM | POA: Diagnosis not present

## 2015-01-10 DIAGNOSIS — G20 Parkinson's disease: Secondary | ICD-10-CM | POA: Diagnosis not present

## 2015-01-10 DIAGNOSIS — F329 Major depressive disorder, single episode, unspecified: Secondary | ICD-10-CM | POA: Diagnosis not present

## 2015-01-10 DIAGNOSIS — Z87891 Personal history of nicotine dependence: Secondary | ICD-10-CM | POA: Diagnosis not present

## 2015-01-10 DIAGNOSIS — Z8546 Personal history of malignant neoplasm of prostate: Secondary | ICD-10-CM | POA: Diagnosis not present

## 2015-01-10 DIAGNOSIS — E1151 Type 2 diabetes mellitus with diabetic peripheral angiopathy without gangrene: Secondary | ICD-10-CM | POA: Diagnosis not present

## 2015-01-10 DIAGNOSIS — I69354 Hemiplegia and hemiparesis following cerebral infarction affecting left non-dominant side: Secondary | ICD-10-CM | POA: Diagnosis not present

## 2015-01-13 DIAGNOSIS — Z8546 Personal history of malignant neoplasm of prostate: Secondary | ICD-10-CM | POA: Diagnosis not present

## 2015-01-13 DIAGNOSIS — G20 Parkinson's disease: Secondary | ICD-10-CM | POA: Diagnosis not present

## 2015-01-13 DIAGNOSIS — F329 Major depressive disorder, single episode, unspecified: Secondary | ICD-10-CM | POA: Diagnosis not present

## 2015-01-13 DIAGNOSIS — I69354 Hemiplegia and hemiparesis following cerebral infarction affecting left non-dominant side: Secondary | ICD-10-CM | POA: Diagnosis not present

## 2015-01-13 DIAGNOSIS — Z87891 Personal history of nicotine dependence: Secondary | ICD-10-CM | POA: Diagnosis not present

## 2015-01-13 DIAGNOSIS — E1151 Type 2 diabetes mellitus with diabetic peripheral angiopathy without gangrene: Secondary | ICD-10-CM | POA: Diagnosis not present

## 2015-01-15 DIAGNOSIS — G20 Parkinson's disease: Secondary | ICD-10-CM | POA: Diagnosis not present

## 2015-01-15 DIAGNOSIS — I69354 Hemiplegia and hemiparesis following cerebral infarction affecting left non-dominant side: Secondary | ICD-10-CM | POA: Diagnosis not present

## 2015-01-15 DIAGNOSIS — F329 Major depressive disorder, single episode, unspecified: Secondary | ICD-10-CM | POA: Diagnosis not present

## 2015-01-15 DIAGNOSIS — Z8546 Personal history of malignant neoplasm of prostate: Secondary | ICD-10-CM | POA: Diagnosis not present

## 2015-01-15 DIAGNOSIS — E1151 Type 2 diabetes mellitus with diabetic peripheral angiopathy without gangrene: Secondary | ICD-10-CM | POA: Diagnosis not present

## 2015-01-15 DIAGNOSIS — Z87891 Personal history of nicotine dependence: Secondary | ICD-10-CM | POA: Diagnosis not present

## 2015-01-16 DIAGNOSIS — E1151 Type 2 diabetes mellitus with diabetic peripheral angiopathy without gangrene: Secondary | ICD-10-CM | POA: Diagnosis not present

## 2015-01-16 DIAGNOSIS — Z87891 Personal history of nicotine dependence: Secondary | ICD-10-CM | POA: Diagnosis not present

## 2015-01-16 DIAGNOSIS — F329 Major depressive disorder, single episode, unspecified: Secondary | ICD-10-CM | POA: Diagnosis not present

## 2015-01-16 DIAGNOSIS — Z8546 Personal history of malignant neoplasm of prostate: Secondary | ICD-10-CM | POA: Diagnosis not present

## 2015-01-16 DIAGNOSIS — I69354 Hemiplegia and hemiparesis following cerebral infarction affecting left non-dominant side: Secondary | ICD-10-CM | POA: Diagnosis not present

## 2015-01-16 DIAGNOSIS — G20 Parkinson's disease: Secondary | ICD-10-CM | POA: Diagnosis not present

## 2015-01-17 DIAGNOSIS — Z8546 Personal history of malignant neoplasm of prostate: Secondary | ICD-10-CM | POA: Diagnosis not present

## 2015-01-17 DIAGNOSIS — Z87891 Personal history of nicotine dependence: Secondary | ICD-10-CM | POA: Diagnosis not present

## 2015-01-17 DIAGNOSIS — F329 Major depressive disorder, single episode, unspecified: Secondary | ICD-10-CM | POA: Diagnosis not present

## 2015-01-17 DIAGNOSIS — I69354 Hemiplegia and hemiparesis following cerebral infarction affecting left non-dominant side: Secondary | ICD-10-CM | POA: Diagnosis not present

## 2015-01-17 DIAGNOSIS — E1151 Type 2 diabetes mellitus with diabetic peripheral angiopathy without gangrene: Secondary | ICD-10-CM | POA: Diagnosis not present

## 2015-01-17 DIAGNOSIS — G20 Parkinson's disease: Secondary | ICD-10-CM | POA: Diagnosis not present

## 2015-01-20 DIAGNOSIS — Z8546 Personal history of malignant neoplasm of prostate: Secondary | ICD-10-CM | POA: Diagnosis not present

## 2015-01-20 DIAGNOSIS — F329 Major depressive disorder, single episode, unspecified: Secondary | ICD-10-CM | POA: Diagnosis not present

## 2015-01-20 DIAGNOSIS — G20 Parkinson's disease: Secondary | ICD-10-CM | POA: Diagnosis not present

## 2015-01-20 DIAGNOSIS — I69354 Hemiplegia and hemiparesis following cerebral infarction affecting left non-dominant side: Secondary | ICD-10-CM | POA: Diagnosis not present

## 2015-01-20 DIAGNOSIS — Z87891 Personal history of nicotine dependence: Secondary | ICD-10-CM | POA: Diagnosis not present

## 2015-01-20 DIAGNOSIS — E1151 Type 2 diabetes mellitus with diabetic peripheral angiopathy without gangrene: Secondary | ICD-10-CM | POA: Diagnosis not present

## 2015-01-21 DIAGNOSIS — Z87891 Personal history of nicotine dependence: Secondary | ICD-10-CM | POA: Diagnosis not present

## 2015-01-21 DIAGNOSIS — I69354 Hemiplegia and hemiparesis following cerebral infarction affecting left non-dominant side: Secondary | ICD-10-CM | POA: Diagnosis not present

## 2015-01-21 DIAGNOSIS — G20 Parkinson's disease: Secondary | ICD-10-CM | POA: Diagnosis not present

## 2015-01-21 DIAGNOSIS — Z8546 Personal history of malignant neoplasm of prostate: Secondary | ICD-10-CM | POA: Diagnosis not present

## 2015-01-21 DIAGNOSIS — E1151 Type 2 diabetes mellitus with diabetic peripheral angiopathy without gangrene: Secondary | ICD-10-CM | POA: Diagnosis not present

## 2015-01-21 DIAGNOSIS — F329 Major depressive disorder, single episode, unspecified: Secondary | ICD-10-CM | POA: Diagnosis not present

## 2015-01-28 DIAGNOSIS — Z87891 Personal history of nicotine dependence: Secondary | ICD-10-CM | POA: Diagnosis not present

## 2015-01-28 DIAGNOSIS — Z8546 Personal history of malignant neoplasm of prostate: Secondary | ICD-10-CM | POA: Diagnosis not present

## 2015-01-28 DIAGNOSIS — G20 Parkinson's disease: Secondary | ICD-10-CM | POA: Diagnosis not present

## 2015-01-28 DIAGNOSIS — F329 Major depressive disorder, single episode, unspecified: Secondary | ICD-10-CM | POA: Diagnosis not present

## 2015-01-28 DIAGNOSIS — I69354 Hemiplegia and hemiparesis following cerebral infarction affecting left non-dominant side: Secondary | ICD-10-CM | POA: Diagnosis not present

## 2015-01-28 DIAGNOSIS — E1151 Type 2 diabetes mellitus with diabetic peripheral angiopathy without gangrene: Secondary | ICD-10-CM | POA: Diagnosis not present

## 2015-01-31 DIAGNOSIS — I69354 Hemiplegia and hemiparesis following cerebral infarction affecting left non-dominant side: Secondary | ICD-10-CM | POA: Diagnosis not present

## 2015-01-31 DIAGNOSIS — G20 Parkinson's disease: Secondary | ICD-10-CM | POA: Diagnosis not present

## 2015-01-31 DIAGNOSIS — F329 Major depressive disorder, single episode, unspecified: Secondary | ICD-10-CM | POA: Diagnosis not present

## 2015-01-31 DIAGNOSIS — Z87891 Personal history of nicotine dependence: Secondary | ICD-10-CM | POA: Diagnosis not present

## 2015-01-31 DIAGNOSIS — Z8546 Personal history of malignant neoplasm of prostate: Secondary | ICD-10-CM | POA: Diagnosis not present

## 2015-01-31 DIAGNOSIS — E1151 Type 2 diabetes mellitus with diabetic peripheral angiopathy without gangrene: Secondary | ICD-10-CM | POA: Diagnosis not present

## 2015-02-04 DIAGNOSIS — I69354 Hemiplegia and hemiparesis following cerebral infarction affecting left non-dominant side: Secondary | ICD-10-CM | POA: Diagnosis not present

## 2015-02-04 DIAGNOSIS — E1151 Type 2 diabetes mellitus with diabetic peripheral angiopathy without gangrene: Secondary | ICD-10-CM | POA: Diagnosis not present

## 2015-02-04 DIAGNOSIS — Z8546 Personal history of malignant neoplasm of prostate: Secondary | ICD-10-CM | POA: Diagnosis not present

## 2015-02-04 DIAGNOSIS — G20 Parkinson's disease: Secondary | ICD-10-CM | POA: Diagnosis not present

## 2015-02-04 DIAGNOSIS — F329 Major depressive disorder, single episode, unspecified: Secondary | ICD-10-CM | POA: Diagnosis not present

## 2015-02-04 DIAGNOSIS — Z87891 Personal history of nicotine dependence: Secondary | ICD-10-CM | POA: Diagnosis not present

## 2015-02-06 DIAGNOSIS — E1151 Type 2 diabetes mellitus with diabetic peripheral angiopathy without gangrene: Secondary | ICD-10-CM | POA: Diagnosis not present

## 2015-02-06 DIAGNOSIS — Z8546 Personal history of malignant neoplasm of prostate: Secondary | ICD-10-CM | POA: Diagnosis not present

## 2015-02-06 DIAGNOSIS — I69354 Hemiplegia and hemiparesis following cerebral infarction affecting left non-dominant side: Secondary | ICD-10-CM | POA: Diagnosis not present

## 2015-02-06 DIAGNOSIS — Z87891 Personal history of nicotine dependence: Secondary | ICD-10-CM | POA: Diagnosis not present

## 2015-02-06 DIAGNOSIS — F329 Major depressive disorder, single episode, unspecified: Secondary | ICD-10-CM | POA: Diagnosis not present

## 2015-02-06 DIAGNOSIS — G20 Parkinson's disease: Secondary | ICD-10-CM | POA: Diagnosis not present

## 2015-02-07 DIAGNOSIS — Z87891 Personal history of nicotine dependence: Secondary | ICD-10-CM | POA: Diagnosis not present

## 2015-02-07 DIAGNOSIS — F329 Major depressive disorder, single episode, unspecified: Secondary | ICD-10-CM | POA: Diagnosis not present

## 2015-02-07 DIAGNOSIS — G20 Parkinson's disease: Secondary | ICD-10-CM | POA: Diagnosis not present

## 2015-02-07 DIAGNOSIS — Z8546 Personal history of malignant neoplasm of prostate: Secondary | ICD-10-CM | POA: Diagnosis not present

## 2015-02-07 DIAGNOSIS — E1151 Type 2 diabetes mellitus with diabetic peripheral angiopathy without gangrene: Secondary | ICD-10-CM | POA: Diagnosis not present

## 2015-02-07 DIAGNOSIS — I69354 Hemiplegia and hemiparesis following cerebral infarction affecting left non-dominant side: Secondary | ICD-10-CM | POA: Diagnosis not present

## 2015-02-11 DIAGNOSIS — Z87891 Personal history of nicotine dependence: Secondary | ICD-10-CM | POA: Diagnosis not present

## 2015-02-11 DIAGNOSIS — F329 Major depressive disorder, single episode, unspecified: Secondary | ICD-10-CM | POA: Diagnosis not present

## 2015-02-11 DIAGNOSIS — E1151 Type 2 diabetes mellitus with diabetic peripheral angiopathy without gangrene: Secondary | ICD-10-CM | POA: Diagnosis not present

## 2015-02-11 DIAGNOSIS — G20 Parkinson's disease: Secondary | ICD-10-CM | POA: Diagnosis not present

## 2015-02-11 DIAGNOSIS — Z8546 Personal history of malignant neoplasm of prostate: Secondary | ICD-10-CM | POA: Diagnosis not present

## 2015-02-11 DIAGNOSIS — I69354 Hemiplegia and hemiparesis following cerebral infarction affecting left non-dominant side: Secondary | ICD-10-CM | POA: Diagnosis not present

## 2015-02-12 DIAGNOSIS — Z23 Encounter for immunization: Secondary | ICD-10-CM | POA: Diagnosis not present

## 2015-02-13 DIAGNOSIS — Z87891 Personal history of nicotine dependence: Secondary | ICD-10-CM | POA: Diagnosis not present

## 2015-02-13 DIAGNOSIS — I69354 Hemiplegia and hemiparesis following cerebral infarction affecting left non-dominant side: Secondary | ICD-10-CM | POA: Diagnosis not present

## 2015-02-13 DIAGNOSIS — F329 Major depressive disorder, single episode, unspecified: Secondary | ICD-10-CM | POA: Diagnosis not present

## 2015-02-13 DIAGNOSIS — G20 Parkinson's disease: Secondary | ICD-10-CM | POA: Diagnosis not present

## 2015-02-13 DIAGNOSIS — Z8546 Personal history of malignant neoplasm of prostate: Secondary | ICD-10-CM | POA: Diagnosis not present

## 2015-02-13 DIAGNOSIS — E1151 Type 2 diabetes mellitus with diabetic peripheral angiopathy without gangrene: Secondary | ICD-10-CM | POA: Diagnosis not present

## 2015-03-27 DIAGNOSIS — H6122 Impacted cerumen, left ear: Secondary | ICD-10-CM | POA: Diagnosis not present

## 2015-03-27 DIAGNOSIS — K5900 Constipation, unspecified: Secondary | ICD-10-CM | POA: Diagnosis not present

## 2015-04-23 ENCOUNTER — Ambulatory Visit: Payer: Medicare Other | Admitting: Endocrinology

## 2015-05-03 DIAGNOSIS — I69354 Hemiplegia and hemiparesis following cerebral infarction affecting left non-dominant side: Secondary | ICD-10-CM | POA: Diagnosis not present

## 2015-05-03 DIAGNOSIS — G3184 Mild cognitive impairment, so stated: Secondary | ICD-10-CM | POA: Diagnosis not present

## 2015-05-03 DIAGNOSIS — Z7982 Long term (current) use of aspirin: Secondary | ICD-10-CM | POA: Diagnosis not present

## 2015-05-03 DIAGNOSIS — Z87891 Personal history of nicotine dependence: Secondary | ICD-10-CM | POA: Diagnosis not present

## 2015-05-03 DIAGNOSIS — E1151 Type 2 diabetes mellitus with diabetic peripheral angiopathy without gangrene: Secondary | ICD-10-CM | POA: Diagnosis not present

## 2015-05-03 DIAGNOSIS — F0390 Unspecified dementia without behavioral disturbance: Secondary | ICD-10-CM | POA: Diagnosis not present

## 2015-05-03 DIAGNOSIS — I10 Essential (primary) hypertension: Secondary | ICD-10-CM | POA: Diagnosis not present

## 2015-05-03 DIAGNOSIS — R2689 Other abnormalities of gait and mobility: Secondary | ICD-10-CM | POA: Diagnosis not present

## 2015-05-03 DIAGNOSIS — G20 Parkinson's disease: Secondary | ICD-10-CM | POA: Diagnosis not present

## 2015-05-03 DIAGNOSIS — I70219 Atherosclerosis of native arteries of extremities with intermittent claudication, unspecified extremity: Secondary | ICD-10-CM | POA: Diagnosis not present

## 2015-05-07 DIAGNOSIS — I10 Essential (primary) hypertension: Secondary | ICD-10-CM | POA: Diagnosis not present

## 2015-05-07 DIAGNOSIS — I70219 Atherosclerosis of native arteries of extremities with intermittent claudication, unspecified extremity: Secondary | ICD-10-CM | POA: Diagnosis not present

## 2015-05-07 DIAGNOSIS — G20 Parkinson's disease: Secondary | ICD-10-CM | POA: Diagnosis not present

## 2015-05-07 DIAGNOSIS — I69354 Hemiplegia and hemiparesis following cerebral infarction affecting left non-dominant side: Secondary | ICD-10-CM | POA: Diagnosis not present

## 2015-05-07 DIAGNOSIS — R2689 Other abnormalities of gait and mobility: Secondary | ICD-10-CM | POA: Diagnosis not present

## 2015-05-07 DIAGNOSIS — E1151 Type 2 diabetes mellitus with diabetic peripheral angiopathy without gangrene: Secondary | ICD-10-CM | POA: Diagnosis not present

## 2015-05-09 DIAGNOSIS — E1151 Type 2 diabetes mellitus with diabetic peripheral angiopathy without gangrene: Secondary | ICD-10-CM | POA: Diagnosis not present

## 2015-05-09 DIAGNOSIS — I10 Essential (primary) hypertension: Secondary | ICD-10-CM | POA: Diagnosis not present

## 2015-05-09 DIAGNOSIS — I69354 Hemiplegia and hemiparesis following cerebral infarction affecting left non-dominant side: Secondary | ICD-10-CM | POA: Diagnosis not present

## 2015-05-09 DIAGNOSIS — I70219 Atherosclerosis of native arteries of extremities with intermittent claudication, unspecified extremity: Secondary | ICD-10-CM | POA: Diagnosis not present

## 2015-05-09 DIAGNOSIS — G20 Parkinson's disease: Secondary | ICD-10-CM | POA: Diagnosis not present

## 2015-05-09 DIAGNOSIS — R2689 Other abnormalities of gait and mobility: Secondary | ICD-10-CM | POA: Diagnosis not present

## 2015-05-12 DIAGNOSIS — R2689 Other abnormalities of gait and mobility: Secondary | ICD-10-CM | POA: Diagnosis not present

## 2015-05-12 DIAGNOSIS — I10 Essential (primary) hypertension: Secondary | ICD-10-CM | POA: Diagnosis not present

## 2015-05-12 DIAGNOSIS — G20 Parkinson's disease: Secondary | ICD-10-CM | POA: Diagnosis not present

## 2015-05-12 DIAGNOSIS — I70219 Atherosclerosis of native arteries of extremities with intermittent claudication, unspecified extremity: Secondary | ICD-10-CM | POA: Diagnosis not present

## 2015-05-12 DIAGNOSIS — I69354 Hemiplegia and hemiparesis following cerebral infarction affecting left non-dominant side: Secondary | ICD-10-CM | POA: Diagnosis not present

## 2015-05-12 DIAGNOSIS — E1151 Type 2 diabetes mellitus with diabetic peripheral angiopathy without gangrene: Secondary | ICD-10-CM | POA: Diagnosis not present

## 2015-05-13 DIAGNOSIS — R2689 Other abnormalities of gait and mobility: Secondary | ICD-10-CM | POA: Diagnosis not present

## 2015-05-13 DIAGNOSIS — I10 Essential (primary) hypertension: Secondary | ICD-10-CM | POA: Diagnosis not present

## 2015-05-13 DIAGNOSIS — I70219 Atherosclerosis of native arteries of extremities with intermittent claudication, unspecified extremity: Secondary | ICD-10-CM | POA: Diagnosis not present

## 2015-05-13 DIAGNOSIS — G20 Parkinson's disease: Secondary | ICD-10-CM | POA: Diagnosis not present

## 2015-05-13 DIAGNOSIS — E1151 Type 2 diabetes mellitus with diabetic peripheral angiopathy without gangrene: Secondary | ICD-10-CM | POA: Diagnosis not present

## 2015-05-13 DIAGNOSIS — I69354 Hemiplegia and hemiparesis following cerebral infarction affecting left non-dominant side: Secondary | ICD-10-CM | POA: Diagnosis not present

## 2015-05-14 DIAGNOSIS — I70219 Atherosclerosis of native arteries of extremities with intermittent claudication, unspecified extremity: Secondary | ICD-10-CM | POA: Diagnosis not present

## 2015-05-14 DIAGNOSIS — G20 Parkinson's disease: Secondary | ICD-10-CM | POA: Diagnosis not present

## 2015-05-14 DIAGNOSIS — E1151 Type 2 diabetes mellitus with diabetic peripheral angiopathy without gangrene: Secondary | ICD-10-CM | POA: Diagnosis not present

## 2015-05-14 DIAGNOSIS — I10 Essential (primary) hypertension: Secondary | ICD-10-CM | POA: Diagnosis not present

## 2015-05-14 DIAGNOSIS — I69354 Hemiplegia and hemiparesis following cerebral infarction affecting left non-dominant side: Secondary | ICD-10-CM | POA: Diagnosis not present

## 2015-05-14 DIAGNOSIS — R2689 Other abnormalities of gait and mobility: Secondary | ICD-10-CM | POA: Diagnosis not present

## 2015-05-19 DIAGNOSIS — G20 Parkinson's disease: Secondary | ICD-10-CM | POA: Diagnosis not present

## 2015-05-19 DIAGNOSIS — I70219 Atherosclerosis of native arteries of extremities with intermittent claudication, unspecified extremity: Secondary | ICD-10-CM | POA: Diagnosis not present

## 2015-05-19 DIAGNOSIS — R2689 Other abnormalities of gait and mobility: Secondary | ICD-10-CM | POA: Diagnosis not present

## 2015-05-19 DIAGNOSIS — I69354 Hemiplegia and hemiparesis following cerebral infarction affecting left non-dominant side: Secondary | ICD-10-CM | POA: Diagnosis not present

## 2015-05-19 DIAGNOSIS — I10 Essential (primary) hypertension: Secondary | ICD-10-CM | POA: Diagnosis not present

## 2015-05-19 DIAGNOSIS — E1151 Type 2 diabetes mellitus with diabetic peripheral angiopathy without gangrene: Secondary | ICD-10-CM | POA: Diagnosis not present

## 2015-05-21 DIAGNOSIS — R2689 Other abnormalities of gait and mobility: Secondary | ICD-10-CM | POA: Diagnosis not present

## 2015-05-21 DIAGNOSIS — G20 Parkinson's disease: Secondary | ICD-10-CM | POA: Diagnosis not present

## 2015-05-21 DIAGNOSIS — I10 Essential (primary) hypertension: Secondary | ICD-10-CM | POA: Diagnosis not present

## 2015-05-21 DIAGNOSIS — E1151 Type 2 diabetes mellitus with diabetic peripheral angiopathy without gangrene: Secondary | ICD-10-CM | POA: Diagnosis not present

## 2015-05-21 DIAGNOSIS — I70219 Atherosclerosis of native arteries of extremities with intermittent claudication, unspecified extremity: Secondary | ICD-10-CM | POA: Diagnosis not present

## 2015-05-21 DIAGNOSIS — I69354 Hemiplegia and hemiparesis following cerebral infarction affecting left non-dominant side: Secondary | ICD-10-CM | POA: Diagnosis not present

## 2015-05-26 DIAGNOSIS — E1151 Type 2 diabetes mellitus with diabetic peripheral angiopathy without gangrene: Secondary | ICD-10-CM | POA: Diagnosis not present

## 2015-05-26 DIAGNOSIS — E1159 Type 2 diabetes mellitus with other circulatory complications: Secondary | ICD-10-CM | POA: Diagnosis not present

## 2015-05-26 DIAGNOSIS — H02003 Unspecified entropion of right eye, unspecified eyelid: Secondary | ICD-10-CM | POA: Diagnosis not present

## 2015-05-26 DIAGNOSIS — Z1389 Encounter for screening for other disorder: Secondary | ICD-10-CM | POA: Diagnosis not present

## 2015-05-26 DIAGNOSIS — I69354 Hemiplegia and hemiparesis following cerebral infarction affecting left non-dominant side: Secondary | ICD-10-CM | POA: Diagnosis not present

## 2015-05-26 DIAGNOSIS — I70219 Atherosclerosis of native arteries of extremities with intermittent claudication, unspecified extremity: Secondary | ICD-10-CM | POA: Diagnosis not present

## 2015-05-26 DIAGNOSIS — I10 Essential (primary) hypertension: Secondary | ICD-10-CM | POA: Diagnosis not present

## 2015-05-26 DIAGNOSIS — R2689 Other abnormalities of gait and mobility: Secondary | ICD-10-CM | POA: Diagnosis not present

## 2015-05-26 DIAGNOSIS — G20 Parkinson's disease: Secondary | ICD-10-CM | POA: Diagnosis not present

## 2015-05-26 DIAGNOSIS — I779 Disorder of arteries and arterioles, unspecified: Secondary | ICD-10-CM | POA: Diagnosis not present

## 2015-05-26 DIAGNOSIS — Z0001 Encounter for general adult medical examination with abnormal findings: Secondary | ICD-10-CM | POA: Diagnosis not present

## 2015-05-26 DIAGNOSIS — R001 Bradycardia, unspecified: Secondary | ICD-10-CM | POA: Diagnosis not present

## 2015-05-26 DIAGNOSIS — E785 Hyperlipidemia, unspecified: Secondary | ICD-10-CM | POA: Diagnosis not present

## 2015-05-28 DIAGNOSIS — I10 Essential (primary) hypertension: Secondary | ICD-10-CM | POA: Diagnosis not present

## 2015-05-28 DIAGNOSIS — I70219 Atherosclerosis of native arteries of extremities with intermittent claudication, unspecified extremity: Secondary | ICD-10-CM | POA: Diagnosis not present

## 2015-05-28 DIAGNOSIS — E1151 Type 2 diabetes mellitus with diabetic peripheral angiopathy without gangrene: Secondary | ICD-10-CM | POA: Diagnosis not present

## 2015-05-28 DIAGNOSIS — I69354 Hemiplegia and hemiparesis following cerebral infarction affecting left non-dominant side: Secondary | ICD-10-CM | POA: Diagnosis not present

## 2015-05-28 DIAGNOSIS — G20 Parkinson's disease: Secondary | ICD-10-CM | POA: Diagnosis not present

## 2015-05-28 DIAGNOSIS — R2689 Other abnormalities of gait and mobility: Secondary | ICD-10-CM | POA: Diagnosis not present

## 2015-05-29 DIAGNOSIS — R2689 Other abnormalities of gait and mobility: Secondary | ICD-10-CM | POA: Diagnosis not present

## 2015-05-29 DIAGNOSIS — H02002 Unspecified entropion of right lower eyelid: Secondary | ICD-10-CM | POA: Diagnosis not present

## 2015-05-29 DIAGNOSIS — I69354 Hemiplegia and hemiparesis following cerebral infarction affecting left non-dominant side: Secondary | ICD-10-CM | POA: Diagnosis not present

## 2015-05-29 DIAGNOSIS — E1151 Type 2 diabetes mellitus with diabetic peripheral angiopathy without gangrene: Secondary | ICD-10-CM | POA: Diagnosis not present

## 2015-05-29 DIAGNOSIS — G20 Parkinson's disease: Secondary | ICD-10-CM | POA: Diagnosis not present

## 2015-05-29 DIAGNOSIS — I70219 Atherosclerosis of native arteries of extremities with intermittent claudication, unspecified extremity: Secondary | ICD-10-CM | POA: Diagnosis not present

## 2015-05-29 DIAGNOSIS — I10 Essential (primary) hypertension: Secondary | ICD-10-CM | POA: Diagnosis not present

## 2015-05-30 DIAGNOSIS — I70219 Atherosclerosis of native arteries of extremities with intermittent claudication, unspecified extremity: Secondary | ICD-10-CM | POA: Diagnosis not present

## 2015-05-30 DIAGNOSIS — G20 Parkinson's disease: Secondary | ICD-10-CM | POA: Diagnosis not present

## 2015-05-30 DIAGNOSIS — I69354 Hemiplegia and hemiparesis following cerebral infarction affecting left non-dominant side: Secondary | ICD-10-CM | POA: Diagnosis not present

## 2015-05-30 DIAGNOSIS — R2689 Other abnormalities of gait and mobility: Secondary | ICD-10-CM | POA: Diagnosis not present

## 2015-05-30 DIAGNOSIS — E1151 Type 2 diabetes mellitus with diabetic peripheral angiopathy without gangrene: Secondary | ICD-10-CM | POA: Diagnosis not present

## 2015-05-30 DIAGNOSIS — I10 Essential (primary) hypertension: Secondary | ICD-10-CM | POA: Diagnosis not present

## 2015-06-02 ENCOUNTER — Encounter: Payer: Self-pay | Admitting: Cardiology

## 2015-06-03 DIAGNOSIS — I70219 Atherosclerosis of native arteries of extremities with intermittent claudication, unspecified extremity: Secondary | ICD-10-CM | POA: Diagnosis not present

## 2015-06-03 DIAGNOSIS — G20 Parkinson's disease: Secondary | ICD-10-CM | POA: Diagnosis not present

## 2015-06-03 DIAGNOSIS — I10 Essential (primary) hypertension: Secondary | ICD-10-CM | POA: Diagnosis not present

## 2015-06-03 DIAGNOSIS — I69354 Hemiplegia and hemiparesis following cerebral infarction affecting left non-dominant side: Secondary | ICD-10-CM | POA: Diagnosis not present

## 2015-06-03 DIAGNOSIS — R2689 Other abnormalities of gait and mobility: Secondary | ICD-10-CM | POA: Diagnosis not present

## 2015-06-03 DIAGNOSIS — E1151 Type 2 diabetes mellitus with diabetic peripheral angiopathy without gangrene: Secondary | ICD-10-CM | POA: Diagnosis not present

## 2015-06-04 DIAGNOSIS — H02003 Unspecified entropion of right eye, unspecified eyelid: Secondary | ICD-10-CM | POA: Diagnosis not present

## 2015-06-04 DIAGNOSIS — H02002 Unspecified entropion of right lower eyelid: Secondary | ICD-10-CM | POA: Diagnosis not present

## 2015-06-05 DIAGNOSIS — E1151 Type 2 diabetes mellitus with diabetic peripheral angiopathy without gangrene: Secondary | ICD-10-CM | POA: Diagnosis not present

## 2015-06-05 DIAGNOSIS — I70219 Atherosclerosis of native arteries of extremities with intermittent claudication, unspecified extremity: Secondary | ICD-10-CM | POA: Diagnosis not present

## 2015-06-05 DIAGNOSIS — I69354 Hemiplegia and hemiparesis following cerebral infarction affecting left non-dominant side: Secondary | ICD-10-CM | POA: Diagnosis not present

## 2015-06-05 DIAGNOSIS — R2689 Other abnormalities of gait and mobility: Secondary | ICD-10-CM | POA: Diagnosis not present

## 2015-06-05 DIAGNOSIS — G20 Parkinson's disease: Secondary | ICD-10-CM | POA: Diagnosis not present

## 2015-06-05 DIAGNOSIS — I10 Essential (primary) hypertension: Secondary | ICD-10-CM | POA: Diagnosis not present

## 2015-06-09 DIAGNOSIS — I70219 Atherosclerosis of native arteries of extremities with intermittent claudication, unspecified extremity: Secondary | ICD-10-CM | POA: Diagnosis not present

## 2015-06-09 DIAGNOSIS — R2689 Other abnormalities of gait and mobility: Secondary | ICD-10-CM | POA: Diagnosis not present

## 2015-06-09 DIAGNOSIS — I10 Essential (primary) hypertension: Secondary | ICD-10-CM | POA: Diagnosis not present

## 2015-06-09 DIAGNOSIS — I69354 Hemiplegia and hemiparesis following cerebral infarction affecting left non-dominant side: Secondary | ICD-10-CM | POA: Diagnosis not present

## 2015-06-09 DIAGNOSIS — G20 Parkinson's disease: Secondary | ICD-10-CM | POA: Diagnosis not present

## 2015-06-09 DIAGNOSIS — E1151 Type 2 diabetes mellitus with diabetic peripheral angiopathy without gangrene: Secondary | ICD-10-CM | POA: Diagnosis not present

## 2015-06-11 DIAGNOSIS — I10 Essential (primary) hypertension: Secondary | ICD-10-CM | POA: Diagnosis not present

## 2015-06-11 DIAGNOSIS — G20 Parkinson's disease: Secondary | ICD-10-CM | POA: Diagnosis not present

## 2015-06-11 DIAGNOSIS — I70219 Atherosclerosis of native arteries of extremities with intermittent claudication, unspecified extremity: Secondary | ICD-10-CM | POA: Diagnosis not present

## 2015-06-11 DIAGNOSIS — E1151 Type 2 diabetes mellitus with diabetic peripheral angiopathy without gangrene: Secondary | ICD-10-CM | POA: Diagnosis not present

## 2015-06-11 DIAGNOSIS — I69354 Hemiplegia and hemiparesis following cerebral infarction affecting left non-dominant side: Secondary | ICD-10-CM | POA: Diagnosis not present

## 2015-06-11 DIAGNOSIS — R2689 Other abnormalities of gait and mobility: Secondary | ICD-10-CM | POA: Diagnosis not present

## 2015-06-12 DIAGNOSIS — I10 Essential (primary) hypertension: Secondary | ICD-10-CM | POA: Diagnosis not present

## 2015-06-12 DIAGNOSIS — E1151 Type 2 diabetes mellitus with diabetic peripheral angiopathy without gangrene: Secondary | ICD-10-CM | POA: Diagnosis not present

## 2015-06-12 DIAGNOSIS — G20 Parkinson's disease: Secondary | ICD-10-CM | POA: Diagnosis not present

## 2015-06-12 DIAGNOSIS — I69354 Hemiplegia and hemiparesis following cerebral infarction affecting left non-dominant side: Secondary | ICD-10-CM | POA: Diagnosis not present

## 2015-06-12 DIAGNOSIS — R2689 Other abnormalities of gait and mobility: Secondary | ICD-10-CM | POA: Diagnosis not present

## 2015-06-12 DIAGNOSIS — I70219 Atherosclerosis of native arteries of extremities with intermittent claudication, unspecified extremity: Secondary | ICD-10-CM | POA: Diagnosis not present

## 2015-06-16 DIAGNOSIS — I70219 Atherosclerosis of native arteries of extremities with intermittent claudication, unspecified extremity: Secondary | ICD-10-CM | POA: Diagnosis not present

## 2015-06-16 DIAGNOSIS — E1151 Type 2 diabetes mellitus with diabetic peripheral angiopathy without gangrene: Secondary | ICD-10-CM | POA: Diagnosis not present

## 2015-06-16 DIAGNOSIS — R2689 Other abnormalities of gait and mobility: Secondary | ICD-10-CM | POA: Diagnosis not present

## 2015-06-16 DIAGNOSIS — G20 Parkinson's disease: Secondary | ICD-10-CM | POA: Diagnosis not present

## 2015-06-16 DIAGNOSIS — I10 Essential (primary) hypertension: Secondary | ICD-10-CM | POA: Diagnosis not present

## 2015-06-16 DIAGNOSIS — I69354 Hemiplegia and hemiparesis following cerebral infarction affecting left non-dominant side: Secondary | ICD-10-CM | POA: Diagnosis not present

## 2015-06-18 DIAGNOSIS — I10 Essential (primary) hypertension: Secondary | ICD-10-CM | POA: Diagnosis not present

## 2015-06-18 DIAGNOSIS — I69354 Hemiplegia and hemiparesis following cerebral infarction affecting left non-dominant side: Secondary | ICD-10-CM | POA: Diagnosis not present

## 2015-06-18 DIAGNOSIS — I70219 Atherosclerosis of native arteries of extremities with intermittent claudication, unspecified extremity: Secondary | ICD-10-CM | POA: Diagnosis not present

## 2015-06-18 DIAGNOSIS — G20 Parkinson's disease: Secondary | ICD-10-CM | POA: Diagnosis not present

## 2015-06-18 DIAGNOSIS — E1151 Type 2 diabetes mellitus with diabetic peripheral angiopathy without gangrene: Secondary | ICD-10-CM | POA: Diagnosis not present

## 2015-06-18 DIAGNOSIS — R2689 Other abnormalities of gait and mobility: Secondary | ICD-10-CM | POA: Diagnosis not present

## 2015-06-19 DIAGNOSIS — E1151 Type 2 diabetes mellitus with diabetic peripheral angiopathy without gangrene: Secondary | ICD-10-CM | POA: Diagnosis not present

## 2015-06-19 DIAGNOSIS — I10 Essential (primary) hypertension: Secondary | ICD-10-CM | POA: Diagnosis not present

## 2015-06-19 DIAGNOSIS — I70219 Atherosclerosis of native arteries of extremities with intermittent claudication, unspecified extremity: Secondary | ICD-10-CM | POA: Diagnosis not present

## 2015-06-19 DIAGNOSIS — R2689 Other abnormalities of gait and mobility: Secondary | ICD-10-CM | POA: Diagnosis not present

## 2015-06-19 DIAGNOSIS — G20 Parkinson's disease: Secondary | ICD-10-CM | POA: Diagnosis not present

## 2015-06-19 DIAGNOSIS — I69354 Hemiplegia and hemiparesis following cerebral infarction affecting left non-dominant side: Secondary | ICD-10-CM | POA: Diagnosis not present

## 2015-06-23 DIAGNOSIS — I10 Essential (primary) hypertension: Secondary | ICD-10-CM | POA: Diagnosis not present

## 2015-06-23 DIAGNOSIS — I69354 Hemiplegia and hemiparesis following cerebral infarction affecting left non-dominant side: Secondary | ICD-10-CM | POA: Diagnosis not present

## 2015-06-23 DIAGNOSIS — G20 Parkinson's disease: Secondary | ICD-10-CM | POA: Diagnosis not present

## 2015-06-23 DIAGNOSIS — I70219 Atherosclerosis of native arteries of extremities with intermittent claudication, unspecified extremity: Secondary | ICD-10-CM | POA: Diagnosis not present

## 2015-06-23 DIAGNOSIS — E1151 Type 2 diabetes mellitus with diabetic peripheral angiopathy without gangrene: Secondary | ICD-10-CM | POA: Diagnosis not present

## 2015-06-23 DIAGNOSIS — R2689 Other abnormalities of gait and mobility: Secondary | ICD-10-CM | POA: Diagnosis not present

## 2015-06-25 DIAGNOSIS — R2689 Other abnormalities of gait and mobility: Secondary | ICD-10-CM | POA: Diagnosis not present

## 2015-06-25 DIAGNOSIS — I10 Essential (primary) hypertension: Secondary | ICD-10-CM | POA: Diagnosis not present

## 2015-06-25 DIAGNOSIS — G20 Parkinson's disease: Secondary | ICD-10-CM | POA: Diagnosis not present

## 2015-06-25 DIAGNOSIS — I70219 Atherosclerosis of native arteries of extremities with intermittent claudication, unspecified extremity: Secondary | ICD-10-CM | POA: Diagnosis not present

## 2015-06-25 DIAGNOSIS — E1151 Type 2 diabetes mellitus with diabetic peripheral angiopathy without gangrene: Secondary | ICD-10-CM | POA: Diagnosis not present

## 2015-06-25 DIAGNOSIS — I69354 Hemiplegia and hemiparesis following cerebral infarction affecting left non-dominant side: Secondary | ICD-10-CM | POA: Diagnosis not present

## 2015-06-30 DIAGNOSIS — I10 Essential (primary) hypertension: Secondary | ICD-10-CM | POA: Diagnosis not present

## 2015-06-30 DIAGNOSIS — I69354 Hemiplegia and hemiparesis following cerebral infarction affecting left non-dominant side: Secondary | ICD-10-CM | POA: Diagnosis not present

## 2015-06-30 DIAGNOSIS — I70219 Atherosclerosis of native arteries of extremities with intermittent claudication, unspecified extremity: Secondary | ICD-10-CM | POA: Diagnosis not present

## 2015-06-30 DIAGNOSIS — G20 Parkinson's disease: Secondary | ICD-10-CM | POA: Diagnosis not present

## 2015-06-30 DIAGNOSIS — R2689 Other abnormalities of gait and mobility: Secondary | ICD-10-CM | POA: Diagnosis not present

## 2015-06-30 DIAGNOSIS — E1151 Type 2 diabetes mellitus with diabetic peripheral angiopathy without gangrene: Secondary | ICD-10-CM | POA: Diagnosis not present

## 2015-07-02 DIAGNOSIS — G3184 Mild cognitive impairment, so stated: Secondary | ICD-10-CM | POA: Diagnosis not present

## 2015-07-02 DIAGNOSIS — Z87891 Personal history of nicotine dependence: Secondary | ICD-10-CM | POA: Diagnosis not present

## 2015-07-02 DIAGNOSIS — F0390 Unspecified dementia without behavioral disturbance: Secondary | ICD-10-CM | POA: Diagnosis not present

## 2015-07-02 DIAGNOSIS — G20 Parkinson's disease: Secondary | ICD-10-CM | POA: Diagnosis not present

## 2015-07-02 DIAGNOSIS — R2689 Other abnormalities of gait and mobility: Secondary | ICD-10-CM | POA: Diagnosis not present

## 2015-07-02 DIAGNOSIS — I70219 Atherosclerosis of native arteries of extremities with intermittent claudication, unspecified extremity: Secondary | ICD-10-CM | POA: Diagnosis not present

## 2015-07-02 DIAGNOSIS — I10 Essential (primary) hypertension: Secondary | ICD-10-CM | POA: Diagnosis not present

## 2015-07-02 DIAGNOSIS — Z7982 Long term (current) use of aspirin: Secondary | ICD-10-CM | POA: Diagnosis not present

## 2015-07-02 DIAGNOSIS — E1151 Type 2 diabetes mellitus with diabetic peripheral angiopathy without gangrene: Secondary | ICD-10-CM | POA: Diagnosis not present

## 2015-07-02 DIAGNOSIS — I69354 Hemiplegia and hemiparesis following cerebral infarction affecting left non-dominant side: Secondary | ICD-10-CM | POA: Diagnosis not present

## 2015-07-07 DIAGNOSIS — I70219 Atherosclerosis of native arteries of extremities with intermittent claudication, unspecified extremity: Secondary | ICD-10-CM | POA: Diagnosis not present

## 2015-07-07 DIAGNOSIS — R2689 Other abnormalities of gait and mobility: Secondary | ICD-10-CM | POA: Diagnosis not present

## 2015-07-07 DIAGNOSIS — I10 Essential (primary) hypertension: Secondary | ICD-10-CM | POA: Diagnosis not present

## 2015-07-07 DIAGNOSIS — E1151 Type 2 diabetes mellitus with diabetic peripheral angiopathy without gangrene: Secondary | ICD-10-CM | POA: Diagnosis not present

## 2015-07-07 DIAGNOSIS — I69354 Hemiplegia and hemiparesis following cerebral infarction affecting left non-dominant side: Secondary | ICD-10-CM | POA: Diagnosis not present

## 2015-07-07 DIAGNOSIS — G20 Parkinson's disease: Secondary | ICD-10-CM | POA: Diagnosis not present

## 2015-07-10 DIAGNOSIS — G20 Parkinson's disease: Secondary | ICD-10-CM | POA: Diagnosis not present

## 2015-07-10 DIAGNOSIS — R2689 Other abnormalities of gait and mobility: Secondary | ICD-10-CM | POA: Diagnosis not present

## 2015-07-10 DIAGNOSIS — I10 Essential (primary) hypertension: Secondary | ICD-10-CM | POA: Diagnosis not present

## 2015-07-10 DIAGNOSIS — E1151 Type 2 diabetes mellitus with diabetic peripheral angiopathy without gangrene: Secondary | ICD-10-CM | POA: Diagnosis not present

## 2015-07-10 DIAGNOSIS — I69354 Hemiplegia and hemiparesis following cerebral infarction affecting left non-dominant side: Secondary | ICD-10-CM | POA: Diagnosis not present

## 2015-07-10 DIAGNOSIS — I70219 Atherosclerosis of native arteries of extremities with intermittent claudication, unspecified extremity: Secondary | ICD-10-CM | POA: Diagnosis not present

## 2015-07-14 DIAGNOSIS — E1151 Type 2 diabetes mellitus with diabetic peripheral angiopathy without gangrene: Secondary | ICD-10-CM | POA: Diagnosis not present

## 2015-07-14 DIAGNOSIS — I10 Essential (primary) hypertension: Secondary | ICD-10-CM | POA: Diagnosis not present

## 2015-07-14 DIAGNOSIS — G20 Parkinson's disease: Secondary | ICD-10-CM | POA: Diagnosis not present

## 2015-07-14 DIAGNOSIS — R2689 Other abnormalities of gait and mobility: Secondary | ICD-10-CM | POA: Diagnosis not present

## 2015-07-14 DIAGNOSIS — I70219 Atherosclerosis of native arteries of extremities with intermittent claudication, unspecified extremity: Secondary | ICD-10-CM | POA: Diagnosis not present

## 2015-07-14 DIAGNOSIS — I69354 Hemiplegia and hemiparesis following cerebral infarction affecting left non-dominant side: Secondary | ICD-10-CM | POA: Diagnosis not present

## 2015-07-16 DIAGNOSIS — E1151 Type 2 diabetes mellitus with diabetic peripheral angiopathy without gangrene: Secondary | ICD-10-CM | POA: Diagnosis not present

## 2015-07-16 DIAGNOSIS — R2689 Other abnormalities of gait and mobility: Secondary | ICD-10-CM | POA: Diagnosis not present

## 2015-07-16 DIAGNOSIS — G20 Parkinson's disease: Secondary | ICD-10-CM | POA: Diagnosis not present

## 2015-07-16 DIAGNOSIS — I70219 Atherosclerosis of native arteries of extremities with intermittent claudication, unspecified extremity: Secondary | ICD-10-CM | POA: Diagnosis not present

## 2015-07-16 DIAGNOSIS — I69354 Hemiplegia and hemiparesis following cerebral infarction affecting left non-dominant side: Secondary | ICD-10-CM | POA: Diagnosis not present

## 2015-07-16 DIAGNOSIS — I10 Essential (primary) hypertension: Secondary | ICD-10-CM | POA: Diagnosis not present

## 2015-07-21 DIAGNOSIS — R2689 Other abnormalities of gait and mobility: Secondary | ICD-10-CM | POA: Diagnosis not present

## 2015-07-21 DIAGNOSIS — I10 Essential (primary) hypertension: Secondary | ICD-10-CM | POA: Diagnosis not present

## 2015-07-21 DIAGNOSIS — I69354 Hemiplegia and hemiparesis following cerebral infarction affecting left non-dominant side: Secondary | ICD-10-CM | POA: Diagnosis not present

## 2015-07-21 DIAGNOSIS — I70219 Atherosclerosis of native arteries of extremities with intermittent claudication, unspecified extremity: Secondary | ICD-10-CM | POA: Diagnosis not present

## 2015-07-21 DIAGNOSIS — G20 Parkinson's disease: Secondary | ICD-10-CM | POA: Diagnosis not present

## 2015-07-21 DIAGNOSIS — E1151 Type 2 diabetes mellitus with diabetic peripheral angiopathy without gangrene: Secondary | ICD-10-CM | POA: Diagnosis not present

## 2015-07-22 DIAGNOSIS — J069 Acute upper respiratory infection, unspecified: Secondary | ICD-10-CM | POA: Diagnosis not present

## 2015-07-23 DIAGNOSIS — I69354 Hemiplegia and hemiparesis following cerebral infarction affecting left non-dominant side: Secondary | ICD-10-CM | POA: Diagnosis not present

## 2015-07-23 DIAGNOSIS — I70219 Atherosclerosis of native arteries of extremities with intermittent claudication, unspecified extremity: Secondary | ICD-10-CM | POA: Diagnosis not present

## 2015-07-23 DIAGNOSIS — E1151 Type 2 diabetes mellitus with diabetic peripheral angiopathy without gangrene: Secondary | ICD-10-CM | POA: Diagnosis not present

## 2015-07-23 DIAGNOSIS — R2689 Other abnormalities of gait and mobility: Secondary | ICD-10-CM | POA: Diagnosis not present

## 2015-07-23 DIAGNOSIS — I10 Essential (primary) hypertension: Secondary | ICD-10-CM | POA: Diagnosis not present

## 2015-07-23 DIAGNOSIS — G20 Parkinson's disease: Secondary | ICD-10-CM | POA: Diagnosis not present

## 2015-07-28 DIAGNOSIS — I70219 Atherosclerosis of native arteries of extremities with intermittent claudication, unspecified extremity: Secondary | ICD-10-CM | POA: Diagnosis not present

## 2015-07-28 DIAGNOSIS — G20 Parkinson's disease: Secondary | ICD-10-CM | POA: Diagnosis not present

## 2015-07-28 DIAGNOSIS — E1151 Type 2 diabetes mellitus with diabetic peripheral angiopathy without gangrene: Secondary | ICD-10-CM | POA: Diagnosis not present

## 2015-07-28 DIAGNOSIS — I69354 Hemiplegia and hemiparesis following cerebral infarction affecting left non-dominant side: Secondary | ICD-10-CM | POA: Diagnosis not present

## 2015-07-28 DIAGNOSIS — R2689 Other abnormalities of gait and mobility: Secondary | ICD-10-CM | POA: Diagnosis not present

## 2015-07-28 DIAGNOSIS — I10 Essential (primary) hypertension: Secondary | ICD-10-CM | POA: Diagnosis not present

## 2015-08-04 ENCOUNTER — Observation Stay (HOSPITAL_COMMUNITY)
Admission: EM | Admit: 2015-08-04 | Discharge: 2015-08-06 | Disposition: A | Discharge status: Home / Self Care | Payer: Medicare Other | Attending: Internal Medicine | Admitting: Internal Medicine

## 2015-08-04 ENCOUNTER — Emergency Department (HOSPITAL_COMMUNITY): Payer: Medicare Other

## 2015-08-04 ENCOUNTER — Encounter (HOSPITAL_COMMUNITY): Payer: Self-pay | Admitting: Emergency Medicine

## 2015-08-04 DIAGNOSIS — I69354 Hemiplegia and hemiparesis following cerebral infarction affecting left non-dominant side: Secondary | ICD-10-CM | POA: Diagnosis not present

## 2015-08-04 DIAGNOSIS — Z87891 Personal history of nicotine dependence: Secondary | ICD-10-CM | POA: Insufficient documentation

## 2015-08-04 DIAGNOSIS — E785 Hyperlipidemia, unspecified: Secondary | ICD-10-CM | POA: Insufficient documentation

## 2015-08-04 DIAGNOSIS — Z8673 Personal history of transient ischemic attack (TIA), and cerebral infarction without residual deficits: Secondary | ICD-10-CM | POA: Insufficient documentation

## 2015-08-04 DIAGNOSIS — R339 Retention of urine, unspecified: Secondary | ICD-10-CM | POA: Diagnosis not present

## 2015-08-04 DIAGNOSIS — I779 Disorder of arteries and arterioles, unspecified: Secondary | ICD-10-CM | POA: Diagnosis present

## 2015-08-04 DIAGNOSIS — G4700 Insomnia, unspecified: Secondary | ICD-10-CM | POA: Insufficient documentation

## 2015-08-04 DIAGNOSIS — R402411 Glasgow coma scale score 13-15, in the field [EMT or ambulance]: Secondary | ICD-10-CM | POA: Diagnosis not present

## 2015-08-04 DIAGNOSIS — R55 Syncope and collapse: Principal | ICD-10-CM | POA: Insufficient documentation

## 2015-08-04 DIAGNOSIS — J9811 Atelectasis: Secondary | ICD-10-CM | POA: Diagnosis not present

## 2015-08-04 DIAGNOSIS — N400 Enlarged prostate without lower urinary tract symptoms: Secondary | ICD-10-CM | POA: Insufficient documentation

## 2015-08-04 DIAGNOSIS — F0390 Unspecified dementia without behavioral disturbance: Secondary | ICD-10-CM | POA: Insufficient documentation

## 2015-08-04 DIAGNOSIS — E1151 Type 2 diabetes mellitus with diabetic peripheral angiopathy without gangrene: Secondary | ICD-10-CM | POA: Diagnosis not present

## 2015-08-04 DIAGNOSIS — I639 Cerebral infarction, unspecified: Secondary | ICD-10-CM | POA: Diagnosis present

## 2015-08-04 DIAGNOSIS — D72829 Elevated white blood cell count, unspecified: Secondary | ICD-10-CM | POA: Diagnosis present

## 2015-08-04 DIAGNOSIS — I6523 Occlusion and stenosis of bilateral carotid arteries: Secondary | ICD-10-CM | POA: Insufficient documentation

## 2015-08-04 DIAGNOSIS — E119 Type 2 diabetes mellitus without complications: Secondary | ICD-10-CM

## 2015-08-04 DIAGNOSIS — R262 Difficulty in walking, not elsewhere classified: Secondary | ICD-10-CM | POA: Diagnosis not present

## 2015-08-04 DIAGNOSIS — R197 Diarrhea, unspecified: Secondary | ICD-10-CM | POA: Insufficient documentation

## 2015-08-04 DIAGNOSIS — I10 Essential (primary) hypertension: Secondary | ICD-10-CM | POA: Insufficient documentation

## 2015-08-04 DIAGNOSIS — Z66 Do not resuscitate: Secondary | ICD-10-CM | POA: Diagnosis not present

## 2015-08-04 DIAGNOSIS — E1122 Type 2 diabetes mellitus with diabetic chronic kidney disease: Secondary | ICD-10-CM | POA: Diagnosis not present

## 2015-08-04 DIAGNOSIS — R2689 Other abnormalities of gait and mobility: Secondary | ICD-10-CM | POA: Diagnosis not present

## 2015-08-04 DIAGNOSIS — E291 Testicular hypofunction: Secondary | ICD-10-CM | POA: Diagnosis present

## 2015-08-04 DIAGNOSIS — G20 Parkinson's disease: Secondary | ICD-10-CM | POA: Diagnosis not present

## 2015-08-04 DIAGNOSIS — I739 Peripheral vascular disease, unspecified: Secondary | ICD-10-CM | POA: Diagnosis present

## 2015-08-04 DIAGNOSIS — Z7984 Long term (current) use of oral hypoglycemic drugs: Secondary | ICD-10-CM | POA: Diagnosis not present

## 2015-08-04 DIAGNOSIS — I70219 Atherosclerosis of native arteries of extremities with intermittent claudication, unspecified extremity: Secondary | ICD-10-CM | POA: Diagnosis not present

## 2015-08-04 DIAGNOSIS — Z79899 Other long term (current) drug therapy: Secondary | ICD-10-CM | POA: Insufficient documentation

## 2015-08-04 LAB — CBC WITH DIFFERENTIAL/PLATELET
BASOS PCT: 0 %
Basophils Absolute: 0 10*3/uL (ref 0.0–0.1)
EOS ABS: 0.4 10*3/uL (ref 0.0–0.7)
Eosinophils Relative: 4 %
HCT: 40.4 % (ref 39.0–52.0)
HEMOGLOBIN: 13.5 g/dL (ref 13.0–17.0)
Lymphocytes Relative: 33 %
Lymphs Abs: 4 10*3/uL (ref 0.7–4.0)
MCH: 31.3 pg (ref 26.0–34.0)
MCHC: 33.4 g/dL (ref 30.0–36.0)
MCV: 93.7 fL (ref 78.0–100.0)
MONOS PCT: 11 %
Monocytes Absolute: 1.3 10*3/uL — ABNORMAL HIGH (ref 0.1–1.0)
NEUTROS PCT: 52 %
Neutro Abs: 6.1 10*3/uL (ref 1.7–7.7)
Platelets: 230 10*3/uL (ref 150–400)
RBC: 4.31 MIL/uL (ref 4.22–5.81)
RDW: 12.9 % (ref 11.5–15.5)
WBC: 11.8 10*3/uL — AB (ref 4.0–10.5)

## 2015-08-04 LAB — COMPREHENSIVE METABOLIC PANEL
ALBUMIN: 3.3 g/dL — AB (ref 3.5–5.0)
ALK PHOS: 72 U/L (ref 38–126)
ALT: 18 U/L (ref 17–63)
ANION GAP: 8 (ref 5–15)
AST: 26 U/L (ref 15–41)
BUN: 17 mg/dL (ref 6–20)
CALCIUM: 9 mg/dL (ref 8.9–10.3)
CO2: 27 mmol/L (ref 22–32)
Chloride: 104 mmol/L (ref 101–111)
Creatinine, Ser: 1.28 mg/dL — ABNORMAL HIGH (ref 0.61–1.24)
GFR calc Af Amer: 55 mL/min — ABNORMAL LOW (ref >60)
GFR calc non Af Amer: 47 mL/min — ABNORMAL LOW (ref >60)
GLUCOSE: 192 mg/dL — AB (ref 65–99)
POTASSIUM: 4.1 mmol/L (ref 3.5–5.1)
SODIUM: 139 mmol/L (ref 135–145)
Total Bilirubin: 0.7 mg/dL (ref 0.3–1.2)
Total Protein: 6.2 g/dL — ABNORMAL LOW (ref 6.5–8.1)

## 2015-08-04 LAB — URINALYSIS, ROUTINE W REFLEX MICROSCOPIC
Bilirubin Urine: NEGATIVE
GLUCOSE, UA: NEGATIVE mg/dL
Hgb urine dipstick: NEGATIVE
Ketones, ur: NEGATIVE mg/dL
LEUKOCYTES UA: NEGATIVE
Nitrite: NEGATIVE
PH: 6.5 (ref 5.0–8.0)
Protein, ur: NEGATIVE mg/dL
Specific Gravity, Urine: 1.013 (ref 1.005–1.030)

## 2015-08-04 NOTE — ED Provider Notes (Signed)
CSN: [id number]     Arrival date & time 08/04/15  1926 History   First MD Initiated Contact with Patient 08/04/15 2013     Chief Complaint  Patient presents with  . Loss of Consciousness     (Consider location/radiation/quality/duration/timing/severity/associated sxs/prior Treatment) HPI Level V caveat due to mild dementia 82 year old male who presents with question of syncope. History of peripheral vascular disease, diabetes, carotid artery stenosis, hypertension and prior stroke. Patient is accompanied by his wife and his neighbor who helps look after them. His neighbor states that he was called by patient's wife earlier this evening and she told him that he had passed out and was unresponsive in his chair. His neighbor subsequently called EMS. On EMS arrival patient was conscious and alert and able to answer basic questions. Patient told EMS that he did not have a syncopal episode, but he states he is not quite sure what happened. When questioning what he remembers from tonight, he states that he remembered when EMS arrived. He did not remember anything before, and subsequently states that he could have passed out. States that he has had chronic fatigue for 5 months, currently getting workup from an outpatient setting. Has not had any chest pain, difficulty breathing, lower extremity edema, headache, vision or speech changes, focal numbness or weakness, fevers, urinary complaints, vomiting or diarrhea, cough or other upper respiratory symptoms. No melena or hematochezia.   Past Medical History  Diagnosis Date  . PVD (peripheral vascular disease) ([place])   . Prostate enlargement   . Insomnia   . Pancreatitis   . PVC's (premature ventricular contractions)   . Heart block   . Diabetes mellitus without complication ([place])   . Carotid artery occlusion   . Hypertension   . Stroke [facility])    Past Surgical History  Procedure Laterality Date  . Cholecystectomy  07/18/2011    Procedure:  LAPAROSCOPIC CHOLECYSTECTOMY;  Surgeon: Gwenyth Ober, MD;  Location: [place];  Service: General;  Laterality: N/A;  . Endarterectomy Right 05/28/2013    Procedure: ENDARTERECTOMY CAROTID;  Surgeon: Rosetta Posner, MD;  Location: [facility] OR;  Service: Vascular;  Laterality: Right;  . Cataract extraction w/ intraocular lens  implant, bilateral    . Flexible sigmoidoscopy N/A 09/23/2014    Procedure: FLEXIBLE SIGMOIDOSCOPY;  Surgeon: Garlan Fair, MD;  Location: [facility];  Service: Endoscopy;  Laterality: N/A;   Family History  Problem Relation Age of Onset  . Breast cancer Daughter   . Cancer Daughter 60    died of cancer   . Stroke Father   . Other      hypogonadism   Social History  Substance Use Topics  . Smoking status: Former Smoker -- 3.00 packs/day for 20 years    Quit date: 04/19/1962  . Smokeless tobacco: Never Used  . Alcohol Use: No    Review of Systems 10/14 systems reviewed and are negative other than those stated in the HPI    Allergies  Penicillins  Home Medications   Prior to Admission medications   Medication Sig Start Date End Date Taking? Authorizing Provider  atorvastatin (LIPITOR) 10 MG tablet Take 1 tablet (10 mg total) by mouth daily. 05/30/13   Carlena Sax, MD  donepezil (ARICEPT) 10 MG tablet Take 10 mg by mouth at bedtime.    Historical Provider, MD  ergocalciferol (VITAMIN D2) 50000 UNITS capsule Take 50,000 Units by mouth once a week. Friday    Historical Provider, MD  glimepiride (AMARYL) 1 MG  tablet Take 1 mg by mouth daily before breakfast.    Historical Provider, MD  Memantine HCl ER (NAMENDA XR) 28 MG CP24 Take 28 mg by mouth daily.     Historical Provider, MD  metoprolol tartrate (LOPRESSOR) 25 MG tablet Take 0.5 tablets (12.5 mg total) by mouth 2 (two) times daily. 05/25/13   Carlena Sax, MD  Multiple Vitamin (MULTIVITAMIN WITH MINERALS) TABS Take 1 tablet by mouth daily.    Historical Provider, MD  omeprazole (PRILOSEC) 20 MG capsule Take 20 mg by  mouth daily.    Historical Provider, MD  sertraline (ZOLOFT) 50 MG tablet Take 50 mg by mouth daily.    Historical Provider, MD  Tamsulosin HCl (FLOMAX) 0.4 MG CAPS Take 0.4 mg by mouth daily.     Historical Provider, MD   BP 150/68 mmHg  Pulse 51  Temp(Src) 97.9 F (36.6 C) (Oral)  Resp 17  SpO2 95% Physical Exam Physical Exam  Nursing note and vitals reviewed. Constitutional: Well developed, well nourished, non-toxic, and in no acute distress Head: Normocephalic and atraumatic.  Mouth/Throat: Oropharynx is clear and moist.  Neck: Normal range of motion. Neck supple.  Cardiovascular: Normal rate and regular rhythm.   Pulmonary/Chest: Effort normal and breath sounds normal.  Abdominal: Soft. There is no tenderness. There is no rebound and no guarding.  Musculoskeletal: Normal range of motion.  Neurological: Alert, no facial droop, fluent speech, moves all extremities symmetrically Skin: Skin is warm and dry.  Psychiatric: Cooperative  ED Course  Procedures (including critical care time) Labs Review Labs Reviewed  CBC WITH DIFFERENTIAL/PLATELET - Abnormal; Notable for the following:    WBC 11.8 (*)    Monocytes Absolute 1.3 (*)    All other components within normal limits  COMPREHENSIVE METABOLIC PANEL - Abnormal; Notable for the following:    Glucose, Bld 192 (*)    Creatinine, Ser 1.28 (*)    Total Protein 6.2 (*)    Albumin 3.3 (*)    GFR calc non Af Amer 47 (*)    GFR calc Af Amer 55 (*)    All other components within normal limits  URINALYSIS, ROUTINE W REFLEX MICROSCOPIC (NOT AT [facility])  TROPONIN I  TROPONIN I  TROPONIN I  Randolm Idol, ED    Imaging Review Dg Chest 2 View  08/04/2015  CLINICAL DATA:  Unresponsive earlier today.  Confusion currently EXAM: CHEST  2 VIEW COMPARISON:  December 12, 2014 FINDINGS: There is mild atelectasis in the left base. The lungs elsewhere are clear. Heart size and pulmonary vascularity are normal. No adenopathy. There is  atherosclerotic calcification in the aorta. There is degenerative change in the mid thoracic spine. IMPRESSION: No edema or consolidation.  Mild left base atelectasis. Electronically Signed   By: Lowella Grip III M.D.   On: 08/04/2015 21:11   Ct Head Wo Contrast  08/04/2015  CLINICAL DATA:  Syncopal episode with episode of unresponsiveness for 10 minutes. Weakness. Query loss of consciousness versus confusion. EXAM: CT HEAD WITHOUT CONTRAST TECHNIQUE: Contiguous axial images were obtained from the base of the skull through the vertex without intravenous contrast. COMPARISON:  12/31/2014 FINDINGS: Prominent cerebral atrophy. Ventricular dilatation likely representing central atrophy. Patchy low-attenuation changes in the deep white matter consistent with small vessel ischemia. Small old infarcts in the occipital lobes and cerebellum bilaterally. No mass effect or midline shift. No abnormal extra-axial fluid collections. Gray-white matter junctions are distinct. Basal cisterns are not effaced. No evidence of acute intracranial hemorrhage. No depressed  skull fractures. Visualized paranasal sinuses and mastoid air cells are not opacified. Vascular calcifications. IMPRESSION: No acute intracranial abnormalities. Prominent chronic atrophy and small vessel ischemic changes. Electronically Signed   By: Lucienne Capers M.D.   On: 08/04/2015 21:33   I have personally reviewed and evaluated these images and lab results as part of my medical decision-making.   EKG Interpretation   Date/Time:  Monday August 04 2015 19:39:23 EDT Ventricular Rate:  61 PR Interval:  272 QRS Duration: 91 QT Interval:  415 QTC Calculation: 418 R Axis:   13 Text Interpretation:  Sinus rhythm Prolonged PR interval Baseline wander  in lead(s) II III aVF No significant change since last tracing Confirmed  by Carlina Derks MD, Matheus Spiker [phone]) on 08/04/2015 8:17:45 PM      MDM   Final diagnoses:  Syncope, unspecified syncope type     82 year old male with history of CVA, PVD, diabetes, hypertension and hyperlipidemia who presents with concern for possible syncope. On presentation he is well-appearing, asymptomatic, and in no acute distress. Vital signs non-concerning. EKG without signs of ischemia or stigmata of arrhythmia.  no major metabolic or electrolyte derangements are noted on blood work. He does have a minimally elevated creatinine of 1.28 from 1.2 but normal BUN. No signs of infection on his urinalysis and chest x-ray. Mild leukocytosis but this is chronic. CT head was also performed and negative for any acute intracranial processes. With his age and risk factors he is high risk for more serious causes of syncope. ? Of heart block on his problem list, but he has no documentation on this on prior Ed visits, admission or office visits. No pacemaker. Unclear if he had true syncope as patient and wife now does not remember incident. Will admit for observation to telemetry. Discussed with Dr. Roel Cluck.   Forde Dandy, MD 08/04/15 2251

## 2015-08-04 NOTE — H&P (Signed)
PCP:  Shamleffer, Herschell Dimes, [facility]  Referring provider Viviana Simpler    Chief Complaint:  Possible syncopal episode  HPI: Zachary [name] is a 82 y.o. male   has a past medical history of PVD (peripheral vascular disease) ([place]); Prostate enlargement; Insomnia; Pancreatitis; PVC's (premature ventricular contractions); Heart block; Diabetes mellitus without complication ([place]); Carotid artery occlusion; Hypertension; and Stroke [facility]).   Presented  From home after wife was concerned that he has passed out and was poorly responcive called her neighbor who called 911. Apparently patient was laying down his eyes open but not answering his wife's questions. Upon arrival by EMS patient was alert and oriented was able to answer all questions head denied any episodes of syncope. Just stating he is just feeling very weak. Blood sugar was 234 Upon father questioning the wife herself is now unsure if he actually had a syncopal episode or not. Denies any chest pain no shortness of breath no nausea vomiting diarrhea no fevers no urinary complaints. No cough.  IN ER: WBC 11.8 hemoglobin 13.5 creatinine 1.28 which is slowly rising over the past 2 years CT scan of the head no acute intracranial abnormalities Chest x-ray show no edema or consolidation UA showing no evidence of UTI Regarding pertinent past history: Patient has history of chronic leukocytosis followed by oncology History of remote stroke and end endarterectomy in 2015 on the right patient has also history of chronic fatigue is followed by oncology pot to have no indication for father workup Hospitalist was called for admission for ashen double syncope  Review of Systems:    Pertinent positives include: fatigue,  Constitutional:  No weight loss, night sweats, Fevers, chills,  weight loss  HEENT:  No headaches, Difficulty swallowing,Tooth/dental problems,Sore throat,  No sneezing, itching, ear ache, nasal  congestion, post nasal drip,  Cardio-vascular:  No chest pain, Orthopnea, PND, anasarca, dizziness, palpitations.no Bilateral lower extremity swelling  GI:  No heartburn, indigestion, abdominal pain, nausea, vomiting, diarrhea, change in bowel habits, loss of appetite, melena, blood in stool, hematemesis Resp:  no shortness of breath at rest. No dyspnea on exertion, No excess mucus, no productive cough, No non-productive cough, No coughing up of blood.No change in color of mucus.No wheezing. Skin:  no rash or lesions. No jaundice GU:  no dysuria, change in color of urine, no urgency or frequency. No straining to urinate.  No flank pain.  Musculoskeletal:  No joint pain or no joint swelling. No decreased range of motion. No back pain.  Psych:  No change in mood or affect. No depression or anxiety. No memory loss.  Neuro: no localizing neurological complaints, no tingling, no weakness, no double vision, no gait abnormality, no slurred speech, no confusion  Otherwise ROS are negative except for above, 10 systems were reviewed  Past Medical History: Past Medical History  Diagnosis Date  . PVD (peripheral vascular disease) ([place])   . Prostate enlargement   . Insomnia   . Pancreatitis   . PVC's (premature ventricular contractions)   . Heart block   . Diabetes mellitus without complication ([place])   . Carotid artery occlusion   . Hypertension   . Stroke [facility])    Past Surgical History  Procedure Laterality Date  . Cholecystectomy  07/18/2011    Procedure: LAPAROSCOPIC CHOLECYSTECTOMY;  Surgeon: Gwenyth Ober, MD;  Location: [place];  Service: General;  Laterality: N/A;  . Endarterectomy Right 05/28/2013    Procedure: ENDARTERECTOMY CAROTID;  Surgeon: Arvilla Meres  Early, MD;  Location: [facility] OR;  Service: Vascular;  Laterality: Right;  . Cataract extraction w/ intraocular lens  implant, bilateral    . Flexible sigmoidoscopy N/A 09/23/2014    Procedure: FLEXIBLE SIGMOIDOSCOPY;  Surgeon: Garlan Fair, MD;  Location: [facility];  Service: Endoscopy;  Laterality: N/A;     Medications: Prior to Admission medications   Medication Sig Start Date End Date Taking? Authorizing Provider  atorvastatin (LIPITOR) 10 MG tablet Take 1 tablet (10 mg total) by mouth daily. 05/30/13   Carlena Sax, MD  donepezil (ARICEPT) 10 MG tablet Take 10 mg by mouth at bedtime.    Historical Provider, MD  ergocalciferol (VITAMIN D2) 50000 UNITS capsule Take 50,000 Units by mouth once a week. Friday    Historical Provider, MD  glimepiride (AMARYL) 1 MG tablet Take 1 mg by mouth daily before breakfast.    Historical Provider, MD  Memantine HCl ER (NAMENDA XR) 28 MG CP24 Take 28 mg by mouth daily.     Historical Provider, MD  metoprolol tartrate (LOPRESSOR) 25 MG tablet Take 0.5 tablets (12.5 mg total) by mouth 2 (two) times daily. 05/25/13   Carlena Sax, MD  Multiple Vitamin (MULTIVITAMIN WITH MINERALS) TABS Take 1 tablet by mouth daily.    Historical Provider, MD  omeprazole (PRILOSEC) 20 MG capsule Take 20 mg by mouth daily.    Historical Provider, MD  sertraline (ZOLOFT) 50 MG tablet Take 50 mg by mouth daily.    Historical Provider, MD  Tamsulosin HCl (FLOMAX) 0.4 MG CAPS Take 0.4 mg by mouth daily.     Historical Provider, MD    Allergies:   Allergies  Allergen Reactions  . Penicillins Rash    Social History:  Ambulatory  independently or cane. She has a home help that comes 6 days a week, Lives at home  With family    reports that he quit smoking about 53 years ago. He has never used smokeless tobacco. He reports that he does not drink alcohol or use illicit drugs.    Family History: family history includes Breast cancer in his daughter; Cancer (age of onset: 76) in his daughter; Stroke in his father.    Physical Exam: Patient Vitals for the past 24 hrs:  BP Temp Temp src Pulse Resp SpO2  08/04/15 2045 150/68 mmHg - - (!) 51 17 95 %  08/04/15 2030 131/80 mmHg - - 62 21 99 %  08/04/15 2015  141/75 mmHg - - 61 18 97 %  08/04/15 2000 149/65 mmHg - - (!) 47 17 95 %  08/04/15 1945 149/62 mmHg - - 64 16 98 %  08/04/15 1943 151/67 mmHg 97.9 F (36.6 C) Oral 61 22 99 %  08/04/15 1942 151/67 mmHg 97.9 F (36.6 C) Oral 60 - 97 %    1. General:  in No Acute distress 2. Psychological: Alert and  Oriented 3. Head/ENT:     Dry Mucous Membranes                          Head Non traumatic, neck supple                           Poor Dentition 4. SKIN: normal  Skin turgor,  Skin clean Dry and intact no rash 5. Heart: Regular rate and rhythm no  Murmur, Rub or gallop 6. Lungs:  Clear to auscultation bilaterally, no wheezes or crackles  7. Abdomen: Soft, non-tender, Non distended 8. Lower extremities: no clubbing, cyanosis, or edema 9. Neurologically strength 5 out of 5 in all 4 extremities cranials 2 through 12 intact 10. MSK: Normal range of motion  body mass index is unknown because there is no weight on file.   Labs on Admission:   Results for orders placed or performed during the hospital encounter of 08/04/15 (from the past 24 hour(s))  CBC with Differential     Status: Abnormal   Collection Time: 08/04/15  8:00 PM  Result Value Ref Range   WBC 11.8 (H) 4.0 - 10.5 K/uL   RBC 4.31 4.22 - 5.81 MIL/uL   Hemoglobin 13.5 13.0 - 17.0 g/dL   HCT 40.4 39.0 - 52.0 %   MCV 93.7 78.0 - 100.0 fL   MCH 31.3 26.0 - 34.0 pg   MCHC 33.4 30.0 - 36.0 g/dL   RDW 12.9 11.5 - 15.5 %   Platelets 230 150 - 400 K/uL   Neutrophils Relative % 52 %   Neutro Abs 6.1 1.7 - 7.7 K/uL   Lymphocytes Relative 33 %   Lymphs Abs 4.0 0.7 - 4.0 K/uL   Monocytes Relative 11 %   Monocytes Absolute 1.3 (H) 0.1 - 1.0 K/uL   Eosinophils Relative 4 %   Eosinophils Absolute 0.4 0.0 - 0.7 K/uL   Basophils Relative 0 %   Basophils Absolute 0.0 0.0 - 0.1 K/uL  Comprehensive metabolic panel     Status: Abnormal   Collection Time: 08/04/15  8:00 PM  Result Value Ref Range   Sodium 139 135 - 145 mmol/L    Potassium 4.1 3.5 - 5.1 mmol/L   Chloride 104 101 - 111 mmol/L   CO2 27 22 - 32 mmol/L   Glucose, Bld 192 (H) 65 - 99 mg/dL   BUN 17 6 - 20 mg/dL   Creatinine, Ser 1.28 (H) 0.61 - 1.24 mg/dL   Calcium 9.0 8.9 - 10.3 mg/dL   Total Protein 6.2 (L) 6.5 - 8.1 g/dL   Albumin 3.3 (L) 3.5 - 5.0 g/dL   AST 26 15 - 41 U/L   ALT 18 17 - 63 U/L   Alkaline Phosphatase 72 38 - 126 U/L   Total Bilirubin 0.7 0.3 - 1.2 mg/dL   GFR calc non Af Amer 47 (L) >60 mL/min   GFR calc Af Amer 55 (L) >60 mL/min   Anion gap 8 5 - 15  Urinalysis, Routine w reflex microscopic (not at [facility])     Status: None   Collection Time: 08/04/15  9:42 PM  Result Value Ref Range   Color, Urine YELLOW YELLOW   APPearance CLEAR CLEAR   Specific Gravity, Urine 1.013 1.005 - 1.030   pH 6.5 5.0 - 8.0   Glucose, UA NEGATIVE NEGATIVE mg/dL   Hgb urine dipstick NEGATIVE NEGATIVE   Bilirubin Urine NEGATIVE NEGATIVE   Ketones, ur NEGATIVE NEGATIVE mg/dL   Protein, ur NEGATIVE NEGATIVE mg/dL   Nitrite NEGATIVE NEGATIVE   Leukocytes, UA NEGATIVE NEGATIVE    UA  no evidence of UTI  Lab Results  Component Value Date   HGBA1C 6.5* 01/07/2014    CrCl cannot be calculated (Unknown ideal weight.).  BNP (last 3 results) No results for input(s): PROBNP in the last 8760 hours.  Other results:  I have pearsonaly reviewed this: ECG REPORT  Rate: 61  Rhythm: Sinus rhythm ST&T Change: no ischemic changes QTC 418   There were no vitals filed for this  visit.   Cultures:    Component Value Date/Time   SDES URINE, CLEAN CATCH 01/21/2014 1425   [place] 01/21/2014 1425   CULT NO GROWTH Performed at [facility] 01/21/2014 1425   REPTSTATUS 01/22/2014 FINAL 01/21/2014 1425     Radiological Exams on Admission: Dg Chest 2 View  08/04/2015  CLINICAL DATA:  Unresponsive earlier today.  Confusion currently EXAM: CHEST  2 VIEW COMPARISON:  December 12, 2014 FINDINGS: There is mild atelectasis in the left base.  The lungs elsewhere are clear. Heart size and pulmonary vascularity are normal. No adenopathy. There is atherosclerotic calcification in the aorta. There is degenerative change in the mid thoracic spine. IMPRESSION: No edema or consolidation.  Mild left base atelectasis. Electronically Signed   By: Lowella Grip III M.D.   On: 08/04/2015 21:11   Ct Head Wo Contrast  08/04/2015  CLINICAL DATA:  Syncopal episode with episode of unresponsiveness for 10 minutes. Weakness. Query loss of consciousness versus confusion. EXAM: CT HEAD WITHOUT CONTRAST TECHNIQUE: Contiguous axial images were obtained from the base of the skull through the vertex without intravenous contrast. COMPARISON:  12/31/2014 FINDINGS: Prominent cerebral atrophy. Ventricular dilatation likely representing central atrophy. Patchy low-attenuation changes in the deep white matter consistent with small vessel ischemia. Small old infarcts in the occipital lobes and cerebellum bilaterally. No mass effect or midline shift. No abnormal extra-axial fluid collections. Gray-white matter junctions are distinct. Basal cisterns are not effaced. No evidence of acute intracranial hemorrhage. No depressed skull fractures. Visualized paranasal sinuses and mastoid air cells are not opacified. Vascular calcifications. IMPRESSION: No acute intracranial abnormalities. Prominent chronic atrophy and small vessel ischemic changes. Electronically Signed   By: Lucienne Capers M.D.   On: 08/04/2015 21:33    Chart has been reviewed  Family not at  Bedside    Assessment/Plan  82 yo M w x of Mild dementia here with questionable syncope here for observation  Present on Admission:  . Dementia without behavioral disturbance  - stable mild continue home meds . Hypertension - stable continue home meds . Leukocytosis - chronic, followed by oncology  DM - order Sliding Scale, Hg A1C . Pre-syncope  questionsble episode unclear hx will observe on tele cycle  CE   Prophylaxis: SCD    CODE STATUS: DNR/DNI as per patient   Disposition:  To home once workup is complete and patient is stable  Other plan as per orders.  I have spent a total of 56 min on this admission    Adlene Adduci 08/05/2015, 3:38 AM    [facility]  Pager [phone]   after 2 AM please page floor coverage PA If 7AM-7PM, please contact the day team taking care of the patient  [email or web address]  Password [email or web address]

## 2015-08-04 NOTE — ED Notes (Signed)
Per EMS, called out for syncopal episode. Wife called EMS and stated that the patient was unresponsive to her for about 10 minutes. Pt was lying down with his eyes open, but not answering wife's questions. Upon EMS arrival, patient was conscious and alert, able to answer basic questions. Pt denied that he ever has a syncopal episode, stated that he was awake but just feeling weak. Pt with stable vital signs and blood sugar of 234.

## 2015-08-05 ENCOUNTER — Encounter (HOSPITAL_COMMUNITY): Payer: Self-pay | Admitting: General Practice

## 2015-08-05 ENCOUNTER — Telehealth: Payer: Self-pay | Admitting: Neurology

## 2015-08-05 ENCOUNTER — Encounter: Payer: Self-pay | Admitting: Neurology

## 2015-08-05 ENCOUNTER — Observation Stay (HOSPITAL_BASED_OUTPATIENT_CLINIC_OR_DEPARTMENT_OTHER): Payer: Medicare Other

## 2015-08-05 DIAGNOSIS — I10 Essential (primary) hypertension: Secondary | ICD-10-CM | POA: Diagnosis not present

## 2015-08-05 DIAGNOSIS — R55 Syncope and collapse: Secondary | ICD-10-CM | POA: Diagnosis not present

## 2015-08-05 DIAGNOSIS — F0390 Unspecified dementia without behavioral disturbance: Secondary | ICD-10-CM | POA: Diagnosis not present

## 2015-08-05 DIAGNOSIS — E1151 Type 2 diabetes mellitus with diabetic peripheral angiopathy without gangrene: Secondary | ICD-10-CM | POA: Diagnosis not present

## 2015-08-05 LAB — GLUCOSE, CAPILLARY
GLUCOSE-CAPILLARY: 101 mg/dL — AB (ref 65–99)
GLUCOSE-CAPILLARY: 145 mg/dL — AB (ref 65–99)
GLUCOSE-CAPILLARY: 120 mg/dL — AB (ref 65–99)
Glucose-Capillary: 104 mg/dL — ABNORMAL HIGH (ref 65–99)

## 2015-08-05 LAB — COMPREHENSIVE METABOLIC PANEL
ALT: 18 U/L (ref 17–63)
AST: 23 U/L (ref 15–41)
Albumin: 3.4 g/dL — ABNORMAL LOW (ref 3.5–5.0)
Alkaline Phosphatase: 73 U/L (ref 38–126)
Anion gap: 10 (ref 5–15)
BUN: 15 mg/dL (ref 6–20)
CHLORIDE: 107 mmol/L (ref 101–111)
CO2: 25 mmol/L (ref 22–32)
Calcium: 8.9 mg/dL (ref 8.9–10.3)
Creatinine, Ser: 1.13 mg/dL (ref 0.61–1.24)
GFR, EST NON AFRICAN AMERICAN: 55 mL/min — AB (ref >60)
Glucose, Bld: 123 mg/dL — ABNORMAL HIGH (ref 65–99)
POTASSIUM: 4 mmol/L (ref 3.5–5.1)
SODIUM: 142 mmol/L (ref 135–145)
Total Bilirubin: 0.6 mg/dL (ref 0.3–1.2)
Total Protein: 6.4 g/dL — ABNORMAL LOW (ref 6.5–8.1)

## 2015-08-05 LAB — CBC
HEMATOCRIT: 41.3 % (ref 39.0–52.0)
HEMOGLOBIN: 13.8 g/dL (ref 13.0–17.0)
MCH: 31.1 pg (ref 26.0–34.0)
MCHC: 33.4 g/dL (ref 30.0–36.0)
MCV: 93 fL (ref 78.0–100.0)
Platelets: 220 10*3/uL (ref 150–400)
RBC: 4.44 MIL/uL (ref 4.22–5.81)
RDW: 12.8 % (ref 11.5–15.5)
WBC: 11.8 10*3/uL — AB (ref 4.0–10.5)

## 2015-08-05 LAB — PHOSPHORUS: PHOSPHORUS: 3.2 mg/dL (ref 2.5–4.6)

## 2015-08-05 LAB — SODIUM, URINE, RANDOM: SODIUM UR: 85 mmol/L

## 2015-08-05 LAB — TROPONIN I
Troponin I: 0.03 ng/mL (ref <0.031)
Troponin I: 0.03 ng/mL (ref <0.031)
Troponin I: <0.03 ng/mL (ref <0.031)

## 2015-08-05 LAB — TSH: TSH: 3.779 u[IU]/mL (ref 0.350–4.500)

## 2015-08-05 LAB — CREATININE, URINE, RANDOM: CREATININE, URINE: 75.42 mg/dL

## 2015-08-05 LAB — MAGNESIUM: Magnesium: 2.1 mg/dL (ref 1.7–2.4)

## 2015-08-05 MED ORDER — DONEPEZIL HCL 10 MG PO TABS
10.0000 mg | ORAL_TABLET | Freq: Every day | ORAL | Status: DC
  Administered 2015-08-05: 10 mg via ORAL
  Filled 2015-08-05: qty 1

## 2015-08-05 MED ORDER — ONDANSETRON HCL 4 MG/2ML IJ SOLN: 4.0000 mg | Freq: Four times a day (QID) | INTRAMUSCULAR | Status: DC | PRN

## 2015-08-05 MED ORDER — ATORVASTATIN CALCIUM 80 MG PO TABS
80.0000 mg | ORAL_TABLET | Freq: Every day | ORAL | Status: DC
  Administered 2015-08-05: 80 mg via ORAL
  Filled 2015-08-05: qty 1

## 2015-08-05 MED ORDER — ATORVASTATIN CALCIUM 10 MG PO TABS
10.0000 mg | ORAL_TABLET | Freq: Every day | ORAL | Status: DC
Start: 2015-08-05 — End: 2015-08-05

## 2015-08-05 MED ORDER — HYDROCODONE-ACETAMINOPHEN 5-325 MG PO TABS
1.0000 | ORAL_TABLET | ORAL | Status: DC | PRN
  Administered 2015-08-05 (×2): 1 via ORAL
  Filled 2015-08-05 (×2): qty 1

## 2015-08-05 MED ORDER — SERTRALINE HCL 50 MG PO TABS
50.0000 mg | ORAL_TABLET | Freq: Every day | ORAL | Status: DC
  Administered 2015-08-05 – 2015-08-06 (×2): 50 mg via ORAL
  Filled 2015-08-05 (×2): qty 1

## 2015-08-05 MED ORDER — ACETAMINOPHEN 650 MG RE SUPP: 650.0000 mg | Freq: Four times a day (QID) | RECTAL | Status: DC | PRN

## 2015-08-05 MED ORDER — INSULIN ASPART 100 UNIT/ML ~~LOC~~ SOLN
0.0000 [IU] | Freq: Three times a day (TID) | SUBCUTANEOUS | Status: DC
Start: 2015-08-05 — End: 2015-08-06
  Administered 2015-08-05: 2 [IU] via SUBCUTANEOUS

## 2015-08-05 MED ORDER — PANTOPRAZOLE SODIUM 40 MG PO TBEC
40.0000 mg | DELAYED_RELEASE_TABLET | Freq: Every day | ORAL | Status: DC
  Administered 2015-08-05 – 2015-08-06 (×2): 40 mg via ORAL
  Filled 2015-08-05 (×2): qty 1

## 2015-08-05 MED ORDER — TAMSULOSIN HCL 0.4 MG PO CAPS
0.4000 mg | ORAL_CAPSULE | Freq: Every day | ORAL | Status: DC
  Administered 2015-08-05 – 2015-08-06 (×2): 0.4 mg via ORAL
  Filled 2015-08-05 (×2): qty 1

## 2015-08-05 MED ORDER — ENOXAPARIN SODIUM 30 MG/0.3ML ~~LOC~~ SOLN
30.0000 mg | SUBCUTANEOUS | Status: DC
  Administered 2015-08-05: 30 mg via SUBCUTANEOUS
  Filled 2015-08-05: qty 0.3

## 2015-08-05 MED ORDER — ENOXAPARIN SODIUM 40 MG/0.4ML ~~LOC~~ SOLN
40.0000 mg | SUBCUTANEOUS | Status: DC
  Administered 2015-08-06: 40 mg via SUBCUTANEOUS
  Filled 2015-08-05: qty 0.4

## 2015-08-05 MED ORDER — METOPROLOL TARTRATE 12.5 MG HALF TABLET
12.5000 mg | ORAL_TABLET | Freq: Two times a day (BID) | ORAL | Status: DC
  Administered 2015-08-05: 12.5 mg via ORAL
  Filled 2015-08-05: qty 1

## 2015-08-05 MED ORDER — ONDANSETRON HCL 4 MG PO TABS: 4.0000 mg | ORAL_TABLET | Freq: Four times a day (QID) | ORAL | Status: DC | PRN

## 2015-08-05 MED ORDER — MEMANTINE HCL ER 28 MG PO CP24
28.0000 mg | ORAL_CAPSULE | Freq: Every day | ORAL | Status: DC
  Administered 2015-08-05 – 2015-08-06 (×2): 28 mg via ORAL
  Filled 2015-08-05 (×2): qty 1

## 2015-08-05 MED ORDER — INSULIN ASPART 100 UNIT/ML ~~LOC~~ SOLN
0.0000 [IU] | Freq: Every day | SUBCUTANEOUS | Status: DC
Start: 2015-08-05 — End: 2015-08-06

## 2015-08-05 MED ORDER — SODIUM CHLORIDE 0.9% FLUSH
3.0000 mL | Freq: Two times a day (BID) | INTRAVENOUS | Status: DC
  Administered 2015-08-05 – 2015-08-06 (×3): 3 mL via INTRAVENOUS

## 2015-08-05 MED ORDER — ACETAMINOPHEN 325 MG PO TABS: 650.0000 mg | ORAL_TABLET | Freq: Four times a day (QID) | ORAL | Status: DC | PRN

## 2015-08-05 MED ORDER — GLIMEPIRIDE 1 MG PO TABS
1.0000 mg | ORAL_TABLET | Freq: Every day | ORAL | Status: DC
  Administered 2015-08-05: 1 mg via ORAL
  Filled 2015-08-05: qty 1

## 2015-08-05 MED ORDER — SODIUM CHLORIDE 0.9 % IV SOLN
INTRAVENOUS | Status: DC
  Administered 2015-08-05: 04:00:00 via INTRAVENOUS

## 2015-08-05 NOTE — ED Notes (Signed)
Report attempted. No receiving RN available at this time.

## 2015-08-05 NOTE — Care Management Obs Status (Signed)
[place] NOTIFICATION   Patient Details  Name: Zachary [name] MRN: [id number] Date of Birth: 26-Oct-1921   Medicare Observation Status Notification Given:  Yes    Erenest Rasher, RN 08/05/2015, 2:20 PM

## 2015-08-05 NOTE — Telephone Encounter (Signed)
Spoke with Zachary [name] at [facility]- Patient was scheduled to see a sleep specialist on 02/18/2014 and 05/16/2014 and he cancelled both appts.

## 2015-08-05 NOTE — Progress Notes (Addendum)
PROGRESS NOTE    MAYANK BARDER  [id number] DOB: Aug 11, 1921 DOA: 08/04/2015 PCP: Lottie Dawson, MD  Outpatient Specialists:   [facility]   Brief Narrative:  82 y/o ? Poor cognitions Diarrhea managed by Dr. Mahalia Longest sig  Chr Hypogonadism dx 216-not on testosterone now focal active colitis on Flex sig 09/2014 RCEA-sympt carotid disease 05/28/2013-had a small right brain CVA + string sign-underwent uneventful endarterectomy ? Akinetic Parkinson's disease Laparoscopic cholecystectomy 07/15/2011.  Chr Leukocytosis without concern for CML ANA + 05/2013, ANA titers 1:80   Assessment & Plan:   Principal Problem:   Pre-syncope in setting known  Carotid artery disease ([place]) -unclear etiology -appears orthostatics were +, should rpt -scale back toprol 12.5--->6.25 -would rather some isolated sys Htn vs POTS -increase Atorvastatin 10--->80 qd -cautious use Anti-cholinergics [Aricept]--Hazard ratio  ? falls, dizzy etc -Echo Pending - although cannot app murmur or bruit in neck, would check carotids given CEA h/o    Hypertension -see above    PVD (peripheral vascular disease) ([place])    Diabetes mellitus ([place]) -hold glimepriide 1 q am -ss1 100-192    Acute ischemic stroke ([place]) -2015 On trental as OP -might benefit ASA? -defer to PCP     Urinary retention -cont Flomax -had PSA last yr wnl as per Dr. Burr Medico    Dementia without behavioral disturbance -cont aricept 10 -Nemenda 28 -SSRI relative blck box warning for dementia-per PCP    Hypogonadism male -not on T    Diarrhea    Leukocytosis -chr -CML apparentlty has been r/o -further f/u Dr. Tennis Must as OP       DVT prophylaxis: Lovenox Code Status: DNR Family Communication: Mr Justin Mend: [c] [id number] Disposition Plan: ?not sure yet   Consultants:   none  Procedures:   Echo and carotids  pending  Antimicrobials:   none    Subjective:  more oriented in am Now a little confused, agitated  Objective: Filed Vitals:   08/05/15 0100 08/05/15 0115 08/05/15 0130 08/05/15 0241  BP: 107/55 113/56 132/65 162/66  Pulse: 67 65 67 76  Temp:    97.6 F (36.4 C)  TempSrc:    Oral  Resp: 15 15 16 19   Height:    5\' 10"  (1.778 m)  Weight:    85.276 kg (188 lb)  SpO2: 92% 92% 93% 99%    Intake/Output Summary (Last 24 hours) at 08/05/15 0825 Last data filed at 08/05/15 0700  Gross per 24 hour  Intake  432.5 ml  Output    800 ml  Net -367.5 ml   Filed Weights   08/05/15 0241  Weight: 85.276 kg (188 lb)    Examination:  General exam: Calm e  Respiratory system: Clear to auscultation. Respiratory effort normal. Cardiovascular system: S1 & S2 heard, RRR. Gastrointestinal system: Abdomen is nondistended, soft  Central nervous system: Alert and oriented. No focal neurological deficits. Extremities: Symmetric 5 x 5 power. Skin: No rashes, lesions or ulcers Psychiatry: Judgement and insight appear normal    Data Reviewed: I have personally reviewed following labs and imaging studies  CBC:  Recent Labs Lab 08/04/15 2000 08/05/15 0448  WBC 11.8* 11.8*  NEUTROABS 6.1  --   HGB 13.5 13.8  HCT 40.4 41.3  MCV 93.7 93.0  PLT 230 [id number]   Basic Metabolic Panel:  Recent Labs Lab 08/04/15 2000 08/05/15 0448  NA 139 142  K 4.1 4.0  CL 104  107  CO2 27 25  GLUCOSE 192* 123*  BUN 17 15  CREATININE 1.28* 1.13  CALCIUM 9.0 8.9  MG  --  2.1  PHOS  --  3.2   GFR: Estimated Creatinine Clearance: 44 mL/min (by C-G formula based on Cr of 1.13). Liver Function Tests:  Recent Labs Lab 08/04/15 2000 08/05/15 0448  AST 26 23  ALT 18 18  ALKPHOS 72 73  BILITOT 0.7 0.6  PROT 6.2* 6.4*  ALBUMIN 3.3* 3.4*   No results for input(s): LIPASE, AMYLASE in the last 168 hours. No results for input(s): AMMONIA in the last 168 hours. Coagulation Profile: No results for  input(s): INR, PROTIME in the last 168 hours. Cardiac Enzymes:  Recent Labs Lab 08/04/15 2347 08/05/15 0448  TROPONINI 0.03 0.03   BNP (last 3 results) No results for input(s): PROBNP in the last 8760 hours. HbA1C: No results for input(s): HGBA1C in the last 72 hours. CBG:  Recent Labs Lab 08/05/15 0636  GLUCAP 104*   Lipid Profile: No results for input(s): CHOL, HDL, LDLCALC, TRIG, CHOLHDL, LDLDIRECT in the last 72 hours. Thyroid Function Tests:  Recent Labs  08/05/15 0448  TSH 3.779   Anemia Panel: No results for input(s): VITAMINB12, FOLATE, FERRITIN, TIBC, IRON, RETICCTPCT in the last 72 hours. Urine analysis:    Component Value Date/Time   COLORURINE YELLOW 08/04/2015 2142   APPEARANCEUR CLEAR 08/04/2015 2142   LABSPEC 1.013 08/04/2015 2142   PHURINE 6.5 08/04/2015 2142   GLUCOSEU NEGATIVE 08/04/2015 2142   HGBUR NEGATIVE 08/04/2015 2142   BILIRUBINUR NEGATIVE 08/04/2015 2142   [place] NEGATIVE 08/04/2015 2142   PROTEINUR NEGATIVE 08/04/2015 2142   UROBILINOGEN 0.2 01/21/2014 1425   NITRITE NEGATIVE 08/04/2015 2142   LEUKOCYTESUR NEGATIVE 08/04/2015 2142     Radiology Studies: Dg Chest 2 View  08/04/2015  CLINICAL DATA:  Unresponsive earlier today.  Confusion currently EXAM: CHEST  2 VIEW COMPARISON:  December 12, 2014 FINDINGS: There is mild atelectasis in the left base. The lungs elsewhere are clear. Heart size and pulmonary vascularity are normal. No adenopathy. There is atherosclerotic calcification in the aorta. There is degenerative change in the mid thoracic spine. IMPRESSION: No edema or consolidation.  Mild left base atelectasis. Electronically Signed   By: Lowella Grip III M.D.   On: 08/04/2015 21:11   Ct Head Wo Contrast  08/04/2015  CLINICAL DATA:  Syncopal episode with episode of unresponsiveness for 10 minutes. Weakness. Query loss of consciousness versus confusion. EXAM: CT HEAD WITHOUT CONTRAST TECHNIQUE: Contiguous axial images were  obtained from the base of the skull through the vertex without intravenous contrast. COMPARISON:  12/31/2014 FINDINGS: Prominent cerebral atrophy. Ventricular dilatation likely representing central atrophy. Patchy low-attenuation changes in the deep white matter consistent with small vessel ischemia. Small old infarcts in the occipital lobes and cerebellum bilaterally. No mass effect or midline shift. No abnormal extra-axial fluid collections. Gray-white matter junctions are distinct. Basal cisterns are not effaced. No evidence of acute intracranial hemorrhage. No depressed skull fractures. Visualized paranasal sinuses and mastoid air cells are not opacified. Vascular calcifications. IMPRESSION: No acute intracranial abnormalities. Prominent chronic atrophy and small vessel ischemic changes. Electronically Signed   By: Lucienne Capers M.D.   On: 08/04/2015 21:33        Scheduled Meds: . atorvastatin  80 mg Oral q1800  . donepezil  10 mg Oral QHS  . [START ON 08/06/2015] enoxaparin (LOVENOX) injection  40 mg Subcutaneous Q24H  . insulin aspart  0-15 Units  Subcutaneous TID WC  . insulin aspart  0-5 Units Subcutaneous QHS  . memantine  28 mg Oral Daily  . pantoprazole  40 mg Oral Daily  . sertraline  50 mg Oral Daily  . sodium chloride flush  3 mL Intravenous Q12H  . tamsulosin  0.4 mg Oral Daily   Continuous Infusions: . sodium chloride 75 mL/hr at 08/05/15 0426        Time spent: [place], MD [facility]   If 7PM-7AM, please contact night-coverage [email or web address] Password [email or web address] 08/05/2015, 8:25 AM

## 2015-08-05 NOTE — Care Management Note (Addendum)
Case Management Note  Patient Details  Name: Zachary [name] MRN: [id number] Date of Birth: Feb 23, 1922  Subjective/Objective:            syncope        Action/Plan: Discharge Planning:  NCM spoke to pt at bedside. States he lives in home with wife. They have caregiver that is private pay that comes each from 8 am-3 pm to assist with ADL's. Waiting final recommendations for home. Attempted call to [place], # [phone]. Left message on voice mail. Received a return call from [employer], pt's neighbor. States he will contact pt's son-in-law, Christinia Gully and have him contact hospital.    Expected Discharge Date:  08/05/2015               Expected Discharge Plan:  Home/Self Care  In-House Referral:  NA  Discharge planning Services  CM Consult  Post Acute Care Choice:  NA Choice offered to:  NA  DME Arranged:  N/A DME Agency:  NA  HH Arranged:  NA HH Agency:  NA  Status of Service:  Completed, signed off  Medicare Important Message Given:    Date Medicare IM Given:    Medicare IM give by:    Date Additional Medicare IM Given:    Additional Medicare Important Message give by:     If discussed at [place] of Stay Meetings, dates discussed:    Additional Comments:  Erenest Rasher, RN 08/05/2015, 2:22 PM

## 2015-08-05 NOTE — Progress Notes (Signed)
   08/05/15 0241  Vitals  Temp 97.6 F (36.4 C)  Temp Source Oral  BP (!) 162/66 mmHg  BP Location Right Arm  BP Method Automatic  Patient Position (if appropriate) Lying  Pulse Rate 76  Pulse Rate Source Dinamap  Resp 19  Oxygen Therapy  SpO2 99 %  O2 Device Room Air  Height and Weight  Height 5\' 10"  (1.778 m)  Weight 85.276 kg (188 lb)  Type of Scale Used Standing  Type of Weight Actual  BSA (Calculated - sq m) 2.05 sq meters  BMI (Calculated) 27  Weight in (lb) to have BMI = 25 173.9  admitted pt to rm 3E19 from ED, pt alert and oriented, denied pain at this time, oriented to room, call bell placed within reach, admission assessment done, orders carried out.

## 2015-08-05 NOTE — Telephone Encounter (Signed)
-----   Message from [place], DO sent at 08/05/2015 12:36 PM EDT ----- Will you call [facility] and find out if pt ever did PSG we ordered to be done in 02/2014

## 2015-08-05 NOTE — Consult Note (Signed)
   [facility] CM Inpatient Consult   08/05/2015  Zachary [name] 03-27-1922 [id number] Referral received to assess for care management services from a call from inpatient RNCM.   Admitted with pre-syncope.   Met with the patient and his wife, [facility] and walking about the room looking for the patient's 'boat' shoes. Met with the patient  regarding the benefits of [facility] Care Management services for post hospital monitoring and community resource needs.  Explained that [place] Management is a covered benefit of insurance. Review information for [facility] Care Management and a brochure was provided with contact information.  Patient states he does not know his new primary care provider because he just met him.  He states he has "private help that takes care of everything, transportation, medications, etc."  and feels that all of his needs are being met.  He states he would "review the brochure and contact [facility] if [he] thinks there is anything [he] needs."    Explained that [place] Management does not interfere with or replace any services arranged by the inpatient care management staff.  Patient declined services with [place] Management at this time.  Encourage the patient to call the number on the brochure or contact number on the business card provided.  Will update inpatient RNCM of outcome of this referral.  For questions or changes please contact:  Natividad Brood, RN BSN [facility] Liaison  [phone] business mobile phone Toll free office [phone]

## 2015-08-05 NOTE — Progress Notes (Addendum)
VASCULAR LAB PRELIMINARY  PRELIMINARY  PRELIMINARY  PRELIMINARY  Carotid duplex completed.    Preliminary report:  Right - 1% to 39% ICA stenosis. Left - 40% to 59% ICA stenosis. Unable to insonate the right vertebral. Left vertebral artery flow is antegrade.  Kahmari Herard, RVS 08/05/2015, 4:29 PM

## 2015-08-06 ENCOUNTER — Other Ambulatory Visit (HOSPITAL_COMMUNITY): Payer: Medicare Other

## 2015-08-06 DIAGNOSIS — E1122 Type 2 diabetes mellitus with diabetic chronic kidney disease: Secondary | ICD-10-CM

## 2015-08-06 DIAGNOSIS — I639 Cerebral infarction, unspecified: Secondary | ICD-10-CM

## 2015-08-06 DIAGNOSIS — R55 Syncope and collapse: Secondary | ICD-10-CM | POA: Diagnosis not present

## 2015-08-06 DIAGNOSIS — I779 Disorder of arteries and arterioles, unspecified: Secondary | ICD-10-CM | POA: Diagnosis not present

## 2015-08-06 DIAGNOSIS — I10 Essential (primary) hypertension: Secondary | ICD-10-CM

## 2015-08-06 DIAGNOSIS — N181 Chronic kidney disease, stage 1: Secondary | ICD-10-CM

## 2015-08-06 LAB — CBC WITH DIFFERENTIAL/PLATELET
BASOS ABS: 0 10*3/uL (ref 0.0–0.1)
BASOS PCT: 0 %
EOS ABS: 0.4 10*3/uL (ref 0.0–0.7)
Eosinophils Relative: 4 %
HCT: 40.8 % (ref 39.0–52.0)
HEMOGLOBIN: 13.5 g/dL (ref 13.0–17.0)
Lymphocytes Relative: 26 %
Lymphs Abs: 2.5 10*3/uL (ref 0.7–4.0)
MCH: 31 pg (ref 26.0–34.0)
MCHC: 33.1 g/dL (ref 30.0–36.0)
MCV: 93.8 fL (ref 78.0–100.0)
Monocytes Absolute: 1.1 10*3/uL — ABNORMAL HIGH (ref 0.1–1.0)
Monocytes Relative: 11 %
NEUTROS PCT: 59 %
Neutro Abs: 5.7 10*3/uL (ref 1.7–7.7)
Platelets: 225 10*3/uL (ref 150–400)
RBC: 4.35 MIL/uL (ref 4.22–5.81)
RDW: 13 % (ref 11.5–15.5)
WBC: 9.7 10*3/uL (ref 4.0–10.5)

## 2015-08-06 LAB — COMPREHENSIVE METABOLIC PANEL
ALK PHOS: 66 U/L (ref 38–126)
ALT: 16 U/L — AB (ref 17–63)
AST: 23 U/L (ref 15–41)
Albumin: 3.1 g/dL — ABNORMAL LOW (ref 3.5–5.0)
Anion gap: 7 (ref 5–15)
BILIRUBIN TOTAL: 0.7 mg/dL (ref 0.3–1.2)
BUN: 13 mg/dL (ref 6–20)
CALCIUM: 8.8 mg/dL — AB (ref 8.9–10.3)
CHLORIDE: 108 mmol/L (ref 101–111)
CO2: 27 mmol/L (ref 22–32)
CREATININE: 1.28 mg/dL — AB (ref 0.61–1.24)
GFR, EST AFRICAN AMERICAN: 55 mL/min — AB (ref >60)
GFR, EST NON AFRICAN AMERICAN: 47 mL/min — AB (ref >60)
Glucose, Bld: 129 mg/dL — ABNORMAL HIGH (ref 65–99)
Potassium: 3.9 mmol/L (ref 3.5–5.1)
Sodium: 142 mmol/L (ref 135–145)
TOTAL PROTEIN: 6.2 g/dL — AB (ref 6.5–8.1)

## 2015-08-06 LAB — GLUCOSE, CAPILLARY: GLUCOSE-CAPILLARY: 74 mg/dL (ref 65–99)

## 2015-08-06 LAB — HEMOGLOBIN A1C
HEMOGLOBIN A1C: 6.6 % — AB (ref 4.8–5.6)
MEAN PLASMA GLUCOSE: 143 mg/dL

## 2015-08-06 NOTE — Discharge Summary (Signed)
Physician Discharge Summary  [name] Zachary [id number] DOB: [birth year] DOA: 08/04/2015  PCP: Lottie Dawson, MD  Admit date: 08/04/2015 Discharge date: 08/06/2015  Time spent: 35 minutes  Recommendations for Outpatient Follow-up:  1. Follow-up with PCP  Discharge Diagnoses:  Principal Problem:   Pre-syncope Active Problems:   Hypertension   PVD (peripheral vascular disease) (Zachary [name])   Diabetes mellitus (Zachary [name])   Acute ischemic stroke (Zachary [name])   Carotid artery disease (Zachary [name])   Urinary retention   Dementia without behavioral disturbance   Hypogonadism male   Diarrhea   Leukocytosis   Syncope   Discharge Condition: stable  Diet recommendation: cardiac  Filed Weights   08/05/15 0241 08/06/15 0206  Weight: 85.276 kg (188 lb) 85.049 kg (187 lb 8 oz)    History of present illness:  Mr. Zachary [name] 82 year old with history of hypertension, peripheral vascular disease coronary artery disease, hypertension, previous history of stroke brought to the emergency department with the complaints of an episode of syncope. Initial workup with a CT head without contrast, chest x-ray, lab work up was unremarkable. No signs of infection was noted. No neuro deficits were noted. Update carotid Dopplersshowed left 50-59% in the right minimal disease echocardiogram done from 2016 showed preserved left ventricular systolic function. Throughout the hospital stay no arrhythmias were notedEvaluated by physical therapy who recommended home health with physical therapy. Vital signs throughout the hospital stay were stable.   Hypertension well controlled throughout the hospital stay.   PVD (peripheral vascular disease) (H[facility])continued with aspirin   Diabetes mellitus (Zachary [name]) held glimepriide 1 q am. ontinued on sliding scale insulin   Acute ischemic stroke ([facility])-2015. On trental as OP. might benefit ASA?. -defer to PCP    Urinary retention . cont Flomax. Had PSA last yr wnl as per Dr.  Burr [name]   Dementia without behavioral disturbance -cont aricept 10 and Nemenda 28. SSRI relative blck box warning for dementia-per PCP   -CML apparentlty has been r/o -further f/u Zachary [name] Onc as OP    Discharge Exam: Filed Vitals:   08/05/15 2114 08/06/15 0620  BP: 138/62 154/67  Pulse: 63 62  Temp: 97.7 F (36.5 C) 97.8 F (36.6 C)  Resp: 18 18    General: not in distress Cardiovascular: S1S2 Respiratory: bilateral clear to auscultation Discharge Instructions    Current Discharge Medication List    CONTINUE these medications which have NOT CHANGED   Details  atorvastatin (LIPITOR) 10 MG tablet Take 1 tablet (10 mg total) by mouth daily.    benzonatate (TESSALON) 200 MG capsule Take 200 mg by mouth 3 (three) times daily as needed for cough.    ergocalciferol (VITAMIN D2) 50000 UNITS capsule Take 50,000 Units by mouth once a week. Friday    fluticasone (FLONASE) 50 MCG/ACT nasal spray Place 1 spray into both nostrils daily as needed for allergies or rhinitis.    Memantine HCl ER (NAMENDA XR) 28 MG CP24 Take 28 mg by mouth daily.     pentoxifylline (TRENTAL) 400 MG CR tablet Take 400 mg by mouth 3 (three) times daily with meals.    sertraline (ZOLOFT) 50 MG tablet Take 50 mg by mouth daily.    Tamsulosin HCl (FLOMAX) 0.4 MG CAPS Take 0.4 mg by mouth daily.     zolpidem (AMBIEN) 5 MG tablet Take 5 mg by mouth at bedtime as needed for sleep.    metoprolol tartrate (LOPRESSOR) 25 MG tablet Take 0.5 tablets (12.5 mg total) by mouth 2 (two) times daily.  Multiple Vitamin (MULTIVITAMIN WITH MINERALS) TABS Take 1 tablet by mouth daily.    omeprazole (PRILOSEC) 20 MG capsule Take 20 mg by mouth daily.      STOP taking these medications     donepezil (ARICEPT) 10 MG tablet        Allergies  Allergen Reactions  . Penicillins Rash    Has patient had a PCN reaction causing immediate rash, facial/tongue/throat swelling, SOB or lightheadedness with hypotension:  No Has patient had a PCN reaction causing severe rash involving mucus membranes or skin necrosis: No Has patient had a PCN reaction that required hospitalization No Has patient had a PCN reaction occurring within the last 10 years: No If all of the above answers are "NO", then may proceed with Cephalosporin use.      The results of significant diagnostics from this hospitalization (including imaging, microbiology, ancillary and laboratory) are listed below for reference.    Significant Diagnostic Studies: Dg Chest 2 View  08/04/2015  CLINICAL DATA:  Unresponsive earlier today.  Confusion currently EXAM: CHEST  2 VIEW COMPARISON:  December 12, 2014 FINDINGS: There is mild atelectasis in the left base. The lungs elsewhere are clear. Heart size and pulmonary vascularity are normal. No adenopathy. There is atherosclerotic calcification in the aorta. There is degenerative change in the mid thoracic spine. IMPRESSION: No edema or consolidation.  Mild left base atelectasis. Electronically Signed   By: Zachary [name] M.D.   On: 08/04/2015 21:11   Ct Head Wo Contrast  08/04/2015  CLINICAL DATA:  Syncopal episode with episode of unresponsiveness for 10 minutes. Weakness. Query loss of consciousness versus confusion. EXAM: CT HEAD WITHOUT CONTRAST TECHNIQUE: Contiguous axial images were obtained from the base of the skull through the vertex without intravenous contrast. COMPARISON:  12/31/2014 FINDINGS: Prominent cerebral atrophy. Ventricular dilatation likely representing central atrophy. Patchy low-attenuation changes in the deep white matter consistent with small vessel ischemia. Small old infarcts in the occipital lobes and cerebellum bilaterally. No mass effect or midline shift. No abnormal extra-axial fluid collections. Gray-white matter junctions are distinct. Basal cisterns are not effaced. No evidence of acute intracranial hemorrhage. No depressed skull fractures. Visualized paranasal sinuses and  mastoid air cells are not opacified. Vascular calcifications. IMPRESSION: No acute intracranial abnormalities. Prominent chronic atrophy and small vessel ischemic changes. Electronically Signed   By: Zachary [name] M.D.   On: 08/04/2015 21:33    Microbiology: No results found for this or any previous visit (from the past 240 hour(s)).   Labs: Basic Metabolic Panel:  Recent Labs Lab 08/04/15 2000 08/05/15 0448 08/06/15 0439  NA 139 142 142  K 4.1 4.0 3.9  CL 104 107 108  CO2 27 25 27   GLUCOSE 192* 123* 129*  BUN 17 15 13   CREATININE 1.28* 1.13 1.28*  CALCIUM 9.0 8.9 8.8*  MG  --  2.1  --   PHOS  --  3.2  --    Liver Function Tests:  Recent Labs Lab 08/04/15 2000 08/05/15 0448 08/06/15 0439  AST 26 23 23   ALT 18 18 16*  ALKPHOS 72 73 66  BILITOT 0.7 0.6 0.7  PROT 6.2* 6.4* 6.2*  ALBUMIN 3.3* 3.4* 3.1*   No results for input(s): LIPASE, AMYLASE in the last 168 hours. No results for input(s): AMMONIA in the last 168 hours. CBC:  Recent Labs Lab 08/04/15 2000 08/05/15 0448 08/06/15 0439  WBC 11.8* 11.8* 9.7  NEUTROABS 6.1  --  5.7  HGB 13.5 13.8 13.5  HCT 40.4 41.3 40.8  MCV 93.7 93.0 93.8  PLT 230 220 225   Cardiac Enzymes:  Recent Labs Lab 08/04/15 2347 08/05/15 0448 08/05/15 1147  TROPONINI 0.03 0.03 <0.03   BNP: BNP (last 3 results) No results for input(s): BNP in the last 8760 hours.  ProBNP (last 3 results) No results for input(s): PROBNP in the last 8760 hours.  CBG:  Recent Labs Lab 08/05/15 0636 08/05/15 1129 08/05/15 1710 08/05/15 2213 08/06/15 0617  GLUCAP 104* 101* 145* 120* 74       Signed:  Courtni Balash MD.  [facility] 08/06/2015, 9:54 AM

## 2015-08-06 NOTE — Evaluation (Signed)
Physical Therapy Evaluation Patient Details Name: Zachary [name] MRN: [id number] DOB: 18-Jul-1921 Today's Date: 08/06/2015   History of Present Illness  Presented4/17 from home after wife was concerned that he has passed out and was poorly responsive called her neighbor who called 911. Apparently patient was laying down his eyes open but not answering his wife's questions. Upon arrival by EMS patient was alert and oriented was able to answer all questions head denied any episodes of syncope. Just stating he is just feeling very weak. Blood sugar was 234. CT and Chest x-ray unremarkable.    Clinical Impression  Pt currently with functional limitations due to the deficits listed below (see PT Problem List). Pt will benefit from skilled PT to increase their independence and safety with mobility to allow discharge to the venue listed below. Pt was receiving HHPT services PTA, and recommend for this to continue to address overall endurance, balance, and mobility.       Follow Up Recommendations Home health PT;Supervision/Assistance - 24 hour    Equipment Recommendations  None recommended by PT    Recommendations for Other Services       Precautions / Restrictions Precautions Precautions: Fall Restrictions Weight Bearing Restrictions: No      Mobility  Bed Mobility Overal bed mobility: Needs Assistance Bed Mobility: Supine to Sit;Sit to Supine     Supine to sit: Min guard Sit to supine: Supervision   General bed mobility comments: Pt requires 2 attempts with momentum to get up from supine position using rails. Steadying assist at trunk to come forward  Transfers Overall transfer level: Needs assistance Equipment used: 1 person hand held assist Transfers: Sit to/from Stand;Stand Pivot Transfers Sit to Stand: Supervision;Min guard Stand pivot transfers: Supervision;Min guard       General transfer comment: requires overall supervision to steadying assist with HHA provided to  simulate pt's use of SPC in the home. States he has a RW but does not use it. Cues for safe technique (Also performed toilet transfers with supervision )  Ambulation/Gait Ambulation/Gait assistance: Supervision;Min guard Ambulation Distance (Feet): 20 Feet Assistive device: 1 person hand held assist Gait Pattern/deviations: Decreased step length - left;Decreased step length - right;Trunk flexed     General Gait Details: tends to furniture walk; supervision to occasional steady assist with cues for upright posture  Stairs            Wheelchair Mobility    Modified Rankin (Stroke Patients Only)       Balance Overall balance assessment: Needs assistance Sitting-balance support: No upper extremity supported;Feet supported Sitting balance-Leahy Scale: Good     Standing balance support: Single extremity supported;Bilateral upper extremity supported;No upper extremity supported;During functional activity Standing balance-Leahy Scale: Fair                               Pertinent Vitals/Pain Pain Assessment: No/denies pain    Home Living Family/patient expects to be discharged to:: Private residence Living Arrangements: Spouse/significant other Available Help at Discharge: Family;Personal care attendant;Available 24 hours/day Type of Home: House Home Access: Stairs to enter Entrance Stairs-Rails: Can reach both Entrance Stairs-Number of Steps: 2-3 Home Layout: One level Home Equipment: Walker - 2 wheels;Cane - single point Additional Comments: Pt's personal care attendant present majority of day per pt report. She has been with him last 2 years and will help with ADLs, things around the home, etc. He also has been receiving HHPT 2 x  week    Prior Function Level of Independence: Needs assistance   Gait / Transfers Assistance Needed: mod I/supervision with SPC houshold distances  ADL's / Homemaking Assistance Needed: assistance from personal care aide         Hand Dominance        Extremity/Trunk Assessment   Upper Extremity Assessment: Generalized weakness           Lower Extremity Assessment: Generalized weakness      Cervical / Trunk Assessment: Kyphotic  Communication   Communication: No difficulties  Cognition Arousal/Alertness: Awake/alert Behavior During Therapy: WFL for tasks assessed/performed Overall Cognitive Status: Within Functional Limits for tasks assessed                      General Comments      Exercises        Assessment/Plan    PT Assessment Patient needs continued PT services  PT Diagnosis Difficulty walking;Generalized weakness   PT Problem List Decreased strength;Decreased activity tolerance;Decreased balance;Decreased mobility  PT Treatment Interventions DME instruction;Gait training;Stair training;Functional mobility training;Therapeutic activities;Therapeutic exercise;Balance training;Neuromuscular re-education;Patient/family education   PT Goals (Current goals can be found in the Care Plan section) Acute Rehab PT Goals Patient Stated Goal: go home today! PT Goal Formulation: With patient Time For Goal Achievement: 08/20/15 Potential to Achieve Goals: Good    Frequency Min 3X/week   Barriers to discharge        Co-evaluation               End of Session Equipment Utilized During Treatment: Gait belt Activity Tolerance: Patient tolerated treatment well;Patient limited by fatigue Patient left: in bed;with call bell/phone within reach Nurse Communication: Mobility status    Functional Assessment Tool Used: clinical judgement Functional Limitation: Mobility: Walking and moving around Mobility: Walking and Moving Around Current Status [id number]): At least 20 percent but less than 40 percent impaired, limited or restricted Mobility: Walking and Moving Around Goal Status [phone]): At least 1 percent but less than 20 percent impaired, limited or restricted    Time:  0802-0822 PT Time Calculation (min) (ACUTE ONLY): 20 min   Charges:   PT Evaluation $PT Eval Low Complexity: 1 Procedure PT Treatments $Therapeutic Activity: 8-22 mins   PT G Codes:   PT G-Codes **NOT FOR INPATIENT CLASS** Functional Assessment Tool Used: clinical judgement Functional Limitation: Mobility: Walking and moving around Mobility: Walking and Moving Around Current Status [id number]): At least 20 percent but less than 40 percent impaired, limited or restricted Mobility: Walking and Moving Around Goal Status [phone]): At least 1 percent but less than 20 percent impaired, limited or restricted    Juanna Cao, PT, DPT Pager #: [phone]  08/06/2015, 8:33 AM

## 2015-08-07 ENCOUNTER — Ambulatory Visit (INDEPENDENT_AMBULATORY_CARE_PROVIDER_SITE_OTHER): Payer: Medicare Other | Admitting: Neurology

## 2015-08-07 ENCOUNTER — Encounter: Payer: Self-pay | Admitting: Neurology

## 2015-08-07 VITALS — BP 130/70 | HR 75

## 2015-08-07 DIAGNOSIS — G609 Hereditary and idiopathic neuropathy, unspecified: Secondary | ICD-10-CM

## 2015-08-07 NOTE — Progress Notes (Signed)
Zachary [name] was seen today in the movement disorders clinic for neurologic consultation at the request of Shamleffer, Herschell Dimes, MD.    The patient is seen today in the movement disorder clinic for consultation regarding possible Parkinson's disease.  The patient is accompanied by his wife who supplements the history.  The patient was apparently seen several years ago by Dr. [name] for the same.  I do not have those records.  He apparently just a one-time visit and was to get an MRI and returned for followup and discussion about medications.  He did not return for followup.  Studies reviewed and available: 05/22/2013 MRI of the brain: Acute posterior right frontal infarction, moderate small vessel disease, and moderate atrophy 05/21/2013 echocardiogram: Left ventricular ejection fraction 55-60% 05/21/2013 carotid ultrasound: 80-99% stenosis of the right internal carotid artery, 40-59% stenosis of the left internal carotid artery. 05/28/2013: Patient underwent right carotid artery endarterectomy 12/25/2013 carotid ultrasound: Widely patent right internal carotid artery, less than 40% stenosis in the left internal carotid artery; with occluded right vertebral proximally with reconstitution distally; stenotic left vertebral in the mid cervical region; significant stenosis of the right subclavian artery.  Pt reports his biggest problem is insomnia and daytime fatigue.  Pt reports he never had a sleep study.  No snoring.  Gets into the bed at 10 pm and watches the news.  If he takes a "sleeping aid" (OTC, as he states that PCP wouldn't renew his sleep med), he will get to sleep by 2:30 am.    Multiple awakenings to use the bathroom and has some new incontinence.  Out of bed at 8:30am.  Never feels refreshed.  Never dozes.  "I sometimes take a nap but I never go to sleep."  Specific Symptoms:  Tremor: No. Voice: no changes Sleep:   Vivid Dreams:  Yes.    Acting out dreams:  No. Wet Pillows:  No. Postural symptoms:  No.  Falls?  No. Bradykinesia symptoms: slow movements Loss of smell:  No. Loss of taste:  No. Urinary Incontinence:  Yes.   Difficulty Swallowing:  Yes.   (liquids some or some trouble with large pills) Handwriting, micrographia: Yes.    Trouble with ADL's:  No.  Trouble buttoning clothing: No. (slower than used to be) Depression:  No. Memory changes:  No. per pt and wife (wife takes care of finances and always has; pt drives without trouble but does very little) Hallucinations:  No.  visual distortions: No. N/V:  No. Lightheaded:  Yes.    Syncope: No. Diplopia:  No. Dyskinesia:  No.  Neuroimaging has previously been performed.  It is available for my review today.  08/07/15 update:  The patient follows up today.  I have not seen him since September, 2015 and that is the only time that I saw him.  At that time, he was complaining about gait instability, which I felt was likely due to peripheral neuropathy as well as vertebral artery stenosis.  He was also complaining about excessive daytime hypersomnolence and I recommended a nocturnal polysomnogram.  He is going to have this done to his primary care provider at [facility].  I called [facility] and pt cancelled this.   I have reviewed records since last visit.  In April, 2016 the patient saw Dr. [name] who diagnosed the patient with hypogonadism.  The patient was started on testosterone, which was ultimately stopped as the patient did not think that it helped.  In September, 2016 the patient had  a fall (versus a syncopal episode) and sustained a hematoma to the forehead.  He was sent to the emergency room and had a CT of the brain.  This was negative.  In that same month, he saw oncology regarding fatigue and leukocytosis.  He had had that leukocytosis since 2010.  It was not felt that this was particularly significant.  On 08/04/2015 he presented to the hospital with another possible syncopal episode.  They are not sure if he  actually had a syncopal episode.  Apparently, he was sitting in his chair and became poorly responsive.  EMS was contacted and his blood sugar was 234.  His CT of the brain that was negative for acute changes.  I reviewed this.  There was very significant atrophy.  There is no changes when compared to September, 2016 examination.  The CNA states that they were told his BP dropped.  His caregiver (CNA)  today states that he is not here because of this but rather because they thought that he had shuffling and changes c/w PD.  He apparently had this appt before the hospitalization.  CNA states that PT has been coming to the house for a few months and noted shuffling and the PT told him to come back to doctor.  Uses cane at home.  Hasn't had a fall for last few months.  No tremors.     ALLERGIES:   Allergies  Allergen Reactions  . Penicillins Rash    Has patient had a PCN reaction causing immediate rash, facial/tongue/throat swelling, SOB or lightheadedness with hypotension: No Has patient had a PCN reaction causing severe rash involving mucus membranes or skin necrosis: No Has patient had a PCN reaction that required hospitalization No Has patient had a PCN reaction occurring within the last 10 years: No If all of the above answers are "NO", then may proceed with Cephalosporin use.    CURRENT MEDICATIONS:  Outpatient Encounter Prescriptions as of 08/07/2015  Medication Sig  . atorvastatin (LIPITOR) 10 MG tablet Take 1 tablet (10 mg total) by mouth daily.  . ergocalciferol (VITAMIN D2) 50000 UNITS capsule Take 50,000 Units by mouth once a week. Friday  . fluticasone (FLONASE) 50 MCG/ACT nasal spray Place 1 spray into both nostrils daily as needed for allergies or rhinitis.  . Memantine HCl ER (NAMENDA XR) 28 MG CP24 Take 28 mg by mouth daily.   . metoprolol tartrate (LOPRESSOR) 25 MG tablet Take 0.5 tablets (12.5 mg total) by mouth 2 (two) times daily.  . Multiple Vitamin (MULTIVITAMIN WITH  MINERALS) TABS Take 1 tablet by mouth daily.  Marland Kitchen omeprazole (PRILOSEC) 20 MG capsule Take 20 mg by mouth daily.  . pentoxifylline (TRENTAL) 400 MG CR tablet Take 400 mg by mouth 3 (three) times daily with meals.  . sertraline (ZOLOFT) 50 MG tablet Take 50 mg by mouth daily.  . Tamsulosin HCl (FLOMAX) 0.4 MG CAPS Take 0.4 mg by mouth daily.   Marland Kitchen zolpidem (AMBIEN) 5 MG tablet Take 5 mg by mouth at bedtime as needed for sleep.  . [DISCONTINUED] benzonatate (TESSALON) 200 MG capsule Take 200 mg by mouth 3 (three) times daily as needed for cough.   No facility-administered encounter medications on file as of 08/07/2015.    PAST MEDICAL HISTORY:   Past Medical History  Diagnosis Date  . PVD (peripheral vascular disease) ([place])   . Prostate enlargement   . Insomnia   . Pancreatitis   . PVC's (premature ventricular contractions)   . Heart  block   . Diabetes mellitus without complication ([place])   . Carotid artery occlusion   . Hypertension   . Stroke [facility])     PAST SURGICAL HISTORY:   Past Surgical History  Procedure Laterality Date  . Cholecystectomy  07/18/2011    Procedure: LAPAROSCOPIC CHOLECYSTECTOMY;  Surgeon: Gwenyth Ober, MD;  Location: [facility];  Service: General;  Laterality: N/A;  . Endarterectomy Right 05/28/2013    Procedure: ENDARTERECTOMY CAROTID;  Surgeon: Rosetta Posner, MD;  Location: [facility] OR;  Service: Vascular;  Laterality: Right;  . Cataract extraction w/ intraocular lens  implant, bilateral    . Flexible sigmoidoscopy N/A 09/23/2014    Procedure: FLEXIBLE SIGMOIDOSCOPY;  Surgeon: Garlan Fair, MD;  Location: [facility] ENDOSCOPY;  Service: Endoscopy;  Laterality: N/A;    SOCIAL HISTORY:   Social History   Social History  . Marital Status: Married    Spouse Name: N/A  . Number of Children: N/A  . Years of Education: N/A   Occupational History  . retired     [place] History Main Topics  . Smoking status: Former Smoker -- 3.00 packs/day for 20 years     Quit date: 04/19/1962  . Smokeless tobacco: Never Used  . Alcohol Use: No  . Drug Use: No  . Sexual Activity: Not Currently   Other Topics Concern  . Not on file   Social History Narrative    FAMILY HISTORY:   Family Status  Relation Status Death Age  . Daughter Deceased 12    breast cancer  . Father Deceased     stroke  . Mother Deceased     unknown cause  . Sister Alive     healthy  . Son Alive     healthy    ROS:  A complete 10 system review of systems was obtained and was unremarkable apart from what is mentioned above.  PHYSICAL EXAMINATION:    VITALS:   Filed Vitals:   08/07/15 1051  BP: 130/70  Pulse: 75    GEN:  The patient appears stated age and is in NAD. HEENT:  Normocephalic, atraumatic.  The mucous membranes are moist. The superficial temporal arteries are without ropiness or tenderness. CV:  RRR Lungs:  CTAB Neck/HEME:  There are no carotid bruits bilaterally.  Neurological examination:  Orientation:  Montreal Cognitive Assessment  01/07/2014  Visuospatial/ Executive (0/5) 3  Naming (0/3) 2  Attention: Read list of digits (0/2) 2  Attention: Read list of letters (0/1) 1  Attention: Serial 7 subtraction starting at 100 (0/3) 2  Language: Repeat phrase (0/2) 2  Language : Fluency (0/1) 0  Abstraction (0/2) 2  Delayed Recall (0/5) 0  Orientation (0/6) 5  Total 19  Adjusted Score (based on education) 19    Cranial nerves: There is good facial symmetry.  The right pupil is surgical and not reactive.  The left pupil is round and nonreactive.  Funduscopic exam reveals a clear disc margin on the right.  No red reflex is even identified on the left and cannot view fundus on the left.  Extraocular muscles are intact. The visual fields are full to confrontational testing. The speech is fluent and clear. Soft palate rises symmetrically and there is no tongue deviation. Hearing is intact to conversational tone. Sensation: Sensation is intact to light and  pinprick throughout (facial, trunk, extremities).  Does not report decreased pinprick in a stocking distribution. Vibration is markedly decreased distally. There is no  extinction with double simultaneous stimulation. There is no sensory dermatomal level identified. Motor: Strength is 5/5 in the bilateral upper and lower extremities.   Shoulder shrug is equal and symmetric.  There is no pronator drift. Deep tendon reflexes: Deep tendon reflexes are 2-/4 at the bilateral biceps, triceps, brachioradialis, patella and absent at the bilateral achilles. Plantar responses are downgoing bilaterally.  Movement examination: Tone: There is normal to the right upper and bilateral lower extremities.  There is gegenhalten in the left upper extremity. Abnormal movements: None Coordination:  There is no significant decremation with RAM's Gait and Station: The patient arises out of the chair by pushing off. The patient's stride length is slightly decreased.  He does have a wide-based gait and a mildly stooped posture.    Labs:  Lab Results  Component Value Date   HGBA1C 6.6* 08/05/2015     ASSESSMENT/PLAN:  1.  Gait instability.  -As his previous examination, he has evidence of peripheral neuropathy and again we talked about safety.  His CNA states that they have many throw rugs in the home and feels confident that his wife will not lift these from the floor.  She also states that the home is very difficult to navigate through and feels that his wife will not want to change anything in this regard.  Nonetheless, we talked about having wide-open hallways and nightlights.  We talked about wearing hard soled shoes.  -The patient does have some for vertebral stenosis, which could be contributing as well.  He had a cerebral infarction last February, but stopped his aspirin on his own as he stated that he just did not like taking this many pills.  I advised him that he may want to restart aspirin, 81 mg daily, but  told him to ask his primary care physician first, as he is also on Trental, although that is traditionally for peripheral vascular disease and not central.  We talked about the increased risk of bleeding on both of these medications together. 2.  I do not think that the patient has idiopathic Parkinson's disease.  I reiterated this today.  This was apparently the primary reason for his visit today.  I did not think he had Parkinson's disease last visit, nor do I think he has Parkinson's disease today. 3.  Follow-up as needed.

## 2015-08-11 DIAGNOSIS — I10 Essential (primary) hypertension: Secondary | ICD-10-CM | POA: Diagnosis not present

## 2015-08-11 DIAGNOSIS — I69354 Hemiplegia and hemiparesis following cerebral infarction affecting left non-dominant side: Secondary | ICD-10-CM | POA: Diagnosis not present

## 2015-08-11 DIAGNOSIS — I70219 Atherosclerosis of native arteries of extremities with intermittent claudication, unspecified extremity: Secondary | ICD-10-CM | POA: Diagnosis not present

## 2015-08-11 DIAGNOSIS — E1151 Type 2 diabetes mellitus with diabetic peripheral angiopathy without gangrene: Secondary | ICD-10-CM | POA: Diagnosis not present

## 2015-08-11 DIAGNOSIS — G20 Parkinson's disease: Secondary | ICD-10-CM | POA: Diagnosis not present

## 2015-08-11 DIAGNOSIS — R2689 Other abnormalities of gait and mobility: Secondary | ICD-10-CM | POA: Diagnosis not present

## 2015-08-12 ENCOUNTER — Encounter: Payer: Medicare Other | Admitting: Interventional Cardiology

## 2015-08-13 ENCOUNTER — Encounter: Payer: Self-pay | Admitting: Interventional Cardiology

## 2015-08-13 DIAGNOSIS — I70219 Atherosclerosis of native arteries of extremities with intermittent claudication, unspecified extremity: Secondary | ICD-10-CM | POA: Diagnosis not present

## 2015-08-13 DIAGNOSIS — E1151 Type 2 diabetes mellitus with diabetic peripheral angiopathy without gangrene: Secondary | ICD-10-CM | POA: Diagnosis not present

## 2015-08-13 DIAGNOSIS — I69354 Hemiplegia and hemiparesis following cerebral infarction affecting left non-dominant side: Secondary | ICD-10-CM | POA: Diagnosis not present

## 2015-08-13 DIAGNOSIS — R2689 Other abnormalities of gait and mobility: Secondary | ICD-10-CM | POA: Diagnosis not present

## 2015-08-13 DIAGNOSIS — I10 Essential (primary) hypertension: Secondary | ICD-10-CM | POA: Diagnosis not present

## 2015-08-13 DIAGNOSIS — G20 Parkinson's disease: Secondary | ICD-10-CM | POA: Diagnosis not present

## 2015-08-18 DIAGNOSIS — R2689 Other abnormalities of gait and mobility: Secondary | ICD-10-CM | POA: Diagnosis not present

## 2015-08-18 DIAGNOSIS — I69354 Hemiplegia and hemiparesis following cerebral infarction affecting left non-dominant side: Secondary | ICD-10-CM | POA: Diagnosis not present

## 2015-08-18 DIAGNOSIS — I10 Essential (primary) hypertension: Secondary | ICD-10-CM | POA: Diagnosis not present

## 2015-08-18 DIAGNOSIS — G20 Parkinson's disease: Secondary | ICD-10-CM | POA: Diagnosis not present

## 2015-08-18 DIAGNOSIS — E1151 Type 2 diabetes mellitus with diabetic peripheral angiopathy without gangrene: Secondary | ICD-10-CM | POA: Diagnosis not present

## 2015-08-18 DIAGNOSIS — I70219 Atherosclerosis of native arteries of extremities with intermittent claudication, unspecified extremity: Secondary | ICD-10-CM | POA: Diagnosis not present

## 2015-08-21 DIAGNOSIS — G20 Parkinson's disease: Secondary | ICD-10-CM | POA: Diagnosis not present

## 2015-08-21 DIAGNOSIS — R2689 Other abnormalities of gait and mobility: Secondary | ICD-10-CM | POA: Diagnosis not present

## 2015-08-21 DIAGNOSIS — I70219 Atherosclerosis of native arteries of extremities with intermittent claudication, unspecified extremity: Secondary | ICD-10-CM | POA: Diagnosis not present

## 2015-08-21 DIAGNOSIS — E1151 Type 2 diabetes mellitus with diabetic peripheral angiopathy without gangrene: Secondary | ICD-10-CM | POA: Diagnosis not present

## 2015-08-21 DIAGNOSIS — I10 Essential (primary) hypertension: Secondary | ICD-10-CM | POA: Diagnosis not present

## 2015-08-21 DIAGNOSIS — I69354 Hemiplegia and hemiparesis following cerebral infarction affecting left non-dominant side: Secondary | ICD-10-CM | POA: Diagnosis not present

## 2015-08-26 DIAGNOSIS — I69354 Hemiplegia and hemiparesis following cerebral infarction affecting left non-dominant side: Secondary | ICD-10-CM | POA: Diagnosis not present

## 2015-08-26 DIAGNOSIS — G20 Parkinson's disease: Secondary | ICD-10-CM | POA: Diagnosis not present

## 2015-08-26 DIAGNOSIS — I10 Essential (primary) hypertension: Secondary | ICD-10-CM | POA: Diagnosis not present

## 2015-08-26 DIAGNOSIS — I70219 Atherosclerosis of native arteries of extremities with intermittent claudication, unspecified extremity: Secondary | ICD-10-CM | POA: Diagnosis not present

## 2015-08-26 DIAGNOSIS — R2689 Other abnormalities of gait and mobility: Secondary | ICD-10-CM | POA: Diagnosis not present

## 2015-08-26 DIAGNOSIS — E1151 Type 2 diabetes mellitus with diabetic peripheral angiopathy without gangrene: Secondary | ICD-10-CM | POA: Diagnosis not present

## 2015-08-27 DIAGNOSIS — I70219 Atherosclerosis of native arteries of extremities with intermittent claudication, unspecified extremity: Secondary | ICD-10-CM | POA: Diagnosis not present

## 2015-08-27 DIAGNOSIS — R2689 Other abnormalities of gait and mobility: Secondary | ICD-10-CM | POA: Diagnosis not present

## 2015-08-27 DIAGNOSIS — E1151 Type 2 diabetes mellitus with diabetic peripheral angiopathy without gangrene: Secondary | ICD-10-CM | POA: Diagnosis not present

## 2015-08-27 DIAGNOSIS — G20 Parkinson's disease: Secondary | ICD-10-CM | POA: Diagnosis not present

## 2015-08-27 DIAGNOSIS — I69354 Hemiplegia and hemiparesis following cerebral infarction affecting left non-dominant side: Secondary | ICD-10-CM | POA: Diagnosis not present

## 2015-08-27 DIAGNOSIS — I10 Essential (primary) hypertension: Secondary | ICD-10-CM | POA: Diagnosis not present

## 2015-08-31 DIAGNOSIS — Z7982 Long term (current) use of aspirin: Secondary | ICD-10-CM | POA: Diagnosis not present

## 2015-08-31 DIAGNOSIS — E1151 Type 2 diabetes mellitus with diabetic peripheral angiopathy without gangrene: Secondary | ICD-10-CM | POA: Diagnosis not present

## 2015-08-31 DIAGNOSIS — Z87891 Personal history of nicotine dependence: Secondary | ICD-10-CM | POA: Diagnosis not present

## 2015-08-31 DIAGNOSIS — G20 Parkinson's disease: Secondary | ICD-10-CM | POA: Diagnosis not present

## 2015-08-31 DIAGNOSIS — R2689 Other abnormalities of gait and mobility: Secondary | ICD-10-CM | POA: Diagnosis not present

## 2015-08-31 DIAGNOSIS — F0390 Unspecified dementia without behavioral disturbance: Secondary | ICD-10-CM | POA: Diagnosis not present

## 2015-08-31 DIAGNOSIS — G3184 Mild cognitive impairment, so stated: Secondary | ICD-10-CM | POA: Diagnosis not present

## 2015-08-31 DIAGNOSIS — I69354 Hemiplegia and hemiparesis following cerebral infarction affecting left non-dominant side: Secondary | ICD-10-CM | POA: Diagnosis not present

## 2015-08-31 DIAGNOSIS — I70219 Atherosclerosis of native arteries of extremities with intermittent claudication, unspecified extremity: Secondary | ICD-10-CM | POA: Diagnosis not present

## 2015-08-31 DIAGNOSIS — I10 Essential (primary) hypertension: Secondary | ICD-10-CM | POA: Diagnosis not present

## 2015-09-01 DIAGNOSIS — I10 Essential (primary) hypertension: Secondary | ICD-10-CM | POA: Diagnosis not present

## 2015-09-01 DIAGNOSIS — R2689 Other abnormalities of gait and mobility: Secondary | ICD-10-CM | POA: Diagnosis not present

## 2015-09-01 DIAGNOSIS — G20 Parkinson's disease: Secondary | ICD-10-CM | POA: Diagnosis not present

## 2015-09-01 DIAGNOSIS — E1151 Type 2 diabetes mellitus with diabetic peripheral angiopathy without gangrene: Secondary | ICD-10-CM | POA: Diagnosis not present

## 2015-09-01 DIAGNOSIS — I70219 Atherosclerosis of native arteries of extremities with intermittent claudication, unspecified extremity: Secondary | ICD-10-CM | POA: Diagnosis not present

## 2015-09-01 DIAGNOSIS — I69354 Hemiplegia and hemiparesis following cerebral infarction affecting left non-dominant side: Secondary | ICD-10-CM | POA: Diagnosis not present

## 2015-09-04 DIAGNOSIS — I70219 Atherosclerosis of native arteries of extremities with intermittent claudication, unspecified extremity: Secondary | ICD-10-CM | POA: Diagnosis not present

## 2015-09-04 DIAGNOSIS — I69354 Hemiplegia and hemiparesis following cerebral infarction affecting left non-dominant side: Secondary | ICD-10-CM | POA: Diagnosis not present

## 2015-09-04 DIAGNOSIS — I10 Essential (primary) hypertension: Secondary | ICD-10-CM | POA: Diagnosis not present

## 2015-09-04 DIAGNOSIS — R2689 Other abnormalities of gait and mobility: Secondary | ICD-10-CM | POA: Diagnosis not present

## 2015-09-04 DIAGNOSIS — G20 Parkinson's disease: Secondary | ICD-10-CM | POA: Diagnosis not present

## 2015-09-04 DIAGNOSIS — E1151 Type 2 diabetes mellitus with diabetic peripheral angiopathy without gangrene: Secondary | ICD-10-CM | POA: Diagnosis not present

## 2015-09-09 DIAGNOSIS — I10 Essential (primary) hypertension: Secondary | ICD-10-CM | POA: Diagnosis not present

## 2015-09-09 DIAGNOSIS — I70219 Atherosclerosis of native arteries of extremities with intermittent claudication, unspecified extremity: Secondary | ICD-10-CM | POA: Diagnosis not present

## 2015-09-09 DIAGNOSIS — R2689 Other abnormalities of gait and mobility: Secondary | ICD-10-CM | POA: Diagnosis not present

## 2015-09-09 DIAGNOSIS — E1151 Type 2 diabetes mellitus with diabetic peripheral angiopathy without gangrene: Secondary | ICD-10-CM | POA: Diagnosis not present

## 2015-09-09 DIAGNOSIS — I69354 Hemiplegia and hemiparesis following cerebral infarction affecting left non-dominant side: Secondary | ICD-10-CM | POA: Diagnosis not present

## 2015-09-09 DIAGNOSIS — G20 Parkinson's disease: Secondary | ICD-10-CM | POA: Diagnosis not present

## 2015-09-11 DIAGNOSIS — G20 Parkinson's disease: Secondary | ICD-10-CM | POA: Diagnosis not present

## 2015-09-11 DIAGNOSIS — I69354 Hemiplegia and hemiparesis following cerebral infarction affecting left non-dominant side: Secondary | ICD-10-CM | POA: Diagnosis not present

## 2015-09-11 DIAGNOSIS — R2689 Other abnormalities of gait and mobility: Secondary | ICD-10-CM | POA: Diagnosis not present

## 2015-09-11 DIAGNOSIS — I10 Essential (primary) hypertension: Secondary | ICD-10-CM | POA: Diagnosis not present

## 2015-09-11 DIAGNOSIS — I70219 Atherosclerosis of native arteries of extremities with intermittent claudication, unspecified extremity: Secondary | ICD-10-CM | POA: Diagnosis not present

## 2015-09-11 DIAGNOSIS — E1151 Type 2 diabetes mellitus with diabetic peripheral angiopathy without gangrene: Secondary | ICD-10-CM | POA: Diagnosis not present

## 2015-09-16 DIAGNOSIS — R2689 Other abnormalities of gait and mobility: Secondary | ICD-10-CM | POA: Diagnosis not present

## 2015-09-16 DIAGNOSIS — I69354 Hemiplegia and hemiparesis following cerebral infarction affecting left non-dominant side: Secondary | ICD-10-CM | POA: Diagnosis not present

## 2015-09-16 DIAGNOSIS — G20 Parkinson's disease: Secondary | ICD-10-CM | POA: Diagnosis not present

## 2015-09-16 DIAGNOSIS — E1151 Type 2 diabetes mellitus with diabetic peripheral angiopathy without gangrene: Secondary | ICD-10-CM | POA: Diagnosis not present

## 2015-09-16 DIAGNOSIS — I70219 Atherosclerosis of native arteries of extremities with intermittent claudication, unspecified extremity: Secondary | ICD-10-CM | POA: Diagnosis not present

## 2015-09-16 DIAGNOSIS — I10 Essential (primary) hypertension: Secondary | ICD-10-CM | POA: Diagnosis not present

## 2015-09-17 DIAGNOSIS — I70219 Atherosclerosis of native arteries of extremities with intermittent claudication, unspecified extremity: Secondary | ICD-10-CM | POA: Diagnosis not present

## 2015-09-17 DIAGNOSIS — I10 Essential (primary) hypertension: Secondary | ICD-10-CM | POA: Diagnosis not present

## 2015-09-17 DIAGNOSIS — R2689 Other abnormalities of gait and mobility: Secondary | ICD-10-CM | POA: Diagnosis not present

## 2015-09-17 DIAGNOSIS — I69354 Hemiplegia and hemiparesis following cerebral infarction affecting left non-dominant side: Secondary | ICD-10-CM | POA: Diagnosis not present

## 2015-09-17 DIAGNOSIS — E1151 Type 2 diabetes mellitus with diabetic peripheral angiopathy without gangrene: Secondary | ICD-10-CM | POA: Diagnosis not present

## 2015-09-17 DIAGNOSIS — G20 Parkinson's disease: Secondary | ICD-10-CM | POA: Diagnosis not present

## 2015-09-18 ENCOUNTER — Ambulatory Visit (INDEPENDENT_AMBULATORY_CARE_PROVIDER_SITE_OTHER): Payer: Medicare Other | Admitting: Interventional Cardiology

## 2015-09-18 ENCOUNTER — Encounter: Payer: Self-pay | Admitting: Interventional Cardiology

## 2015-09-18 VITALS — BP 110/60 | HR 88 | Ht 70.0 in | Wt 190.1 lb

## 2015-09-18 DIAGNOSIS — R5382 Chronic fatigue, unspecified: Secondary | ICD-10-CM | POA: Diagnosis not present

## 2015-09-18 DIAGNOSIS — IMO0001 Reserved for inherently not codable concepts without codable children: Secondary | ICD-10-CM

## 2015-09-18 DIAGNOSIS — R5383 Other fatigue: Secondary | ICD-10-CM | POA: Insufficient documentation

## 2015-09-18 DIAGNOSIS — I739 Peripheral vascular disease, unspecified: Secondary | ICD-10-CM | POA: Diagnosis not present

## 2015-09-18 DIAGNOSIS — I700 Atherosclerosis of aorta: Secondary | ICD-10-CM

## 2015-09-18 DIAGNOSIS — I10 Essential (primary) hypertension: Secondary | ICD-10-CM

## 2015-09-18 NOTE — Progress Notes (Signed)
Cardiology Office Note   Date:  09/18/2015   ID:  Zachary [name], DOB February 23, 1922, MRN [id number]  PCP:  Lottie Dawson, MD    No chief complaint on file. fatigue   Wt Readings from Last 3 Encounters:  09/18/15 190 lb 1.9 oz (86.238 kg)  08/06/15 187 lb 8 oz (85.049 kg)  01/02/15 188 lb 6.4 oz (85.458 kg)       History of Present Illness: Zachary [name] is a 82 y.o. male  who is at peripheral vascular disease.  He was recently admitted to the hospital with lightheadedness.  Although syncope as mentioned in the hospital records, he denies any passing out spells. He describes extreme fatigue. He admits to not drinking as much water as he should. He does get have some lightheadedness when he goes from sitting to standing very quickly. He has fallen but is typically able to catch himself and fall in a way that he does not hurt himself.  In the hospital, there are no signs of arrhythmia on telemetry. There is a mention of bradycardia, but no strips are available showing significant bradycardia.    Past Medical History  Diagnosis Date  . PVD (peripheral vascular disease) ([place])   . Prostate enlargement   . Insomnia   . Pancreatitis   . PVC's (premature ventricular contractions)   . Heart block   . Diabetes mellitus without complication ([place])   . Carotid artery occlusion   . Hypertension   . Stroke [facility])     Past Surgical History  Procedure Laterality Date  . Cholecystectomy  07/18/2011    Procedure: LAPAROSCOPIC CHOLECYSTECTOMY;  Surgeon: Gwenyth Ober, MD;  Location: [place];  Service: General;  Laterality: N/A;  . Endarterectomy Right 05/28/2013    Procedure: ENDARTERECTOMY CAROTID;  Surgeon: Rosetta Posner, MD;  Location: [facility] OR;  Service: Vascular;  Laterality: Right;  . Cataract extraction w/ intraocular lens  implant, bilateral    . Flexible sigmoidoscopy N/A 09/23/2014    Procedure: FLEXIBLE SIGMOIDOSCOPY;  Surgeon: Garlan Fair, MD;  Location: [facility]  ENDOSCOPY;  Service: Endoscopy;  Laterality: N/A;     Current Outpatient Prescriptions  Medication Sig Dispense Refill  . atorvastatin (LIPITOR) 10 MG tablet Take 1 tablet (10 mg total) by mouth daily.    . ergocalciferol (VITAMIN D2) 50000 UNITS capsule Take 50,000 Units by mouth once a week. Friday    . fluticasone (FLONASE) 50 MCG/ACT nasal spray Place 1 spray into both nostrils daily as needed for allergies or rhinitis.    . Memantine HCl ER (NAMENDA XR) 28 MG CP24 Take 28 mg by mouth daily.     . metoprolol tartrate (LOPRESSOR) 25 MG tablet Take 0.5 tablets (12.5 mg total) by mouth 2 (two) times daily.    . Multiple Vitamin (MULTIVITAMIN WITH MINERALS) TABS Take 1 tablet by mouth daily.    Marland Kitchen omeprazole (PRILOSEC) 20 MG capsule Take 20 mg by mouth daily.    . pentoxifylline (TRENTAL) 400 MG CR tablet Take 400 mg by mouth 3 (three) times daily with meals.    . sertraline (ZOLOFT) 50 MG tablet Take 50 mg by mouth daily.    . Tamsulosin HCl (FLOMAX) 0.4 MG CAPS Take 0.4 mg by mouth daily.     Marland Kitchen zolpidem (AMBIEN) 5 MG tablet Take 5 mg by mouth at bedtime as needed for sleep.     No current facility-administered medications for this visit.    Allergies:   Penicillins  Social History:  The patient  reports that he quit smoking about 53 years ago. He has never used smokeless tobacco. He reports that he does not drink alcohol or use illicit drugs.   Family History:  The patient's family history includes Breast cancer in his daughter; Cancer (age of onset: 42) in his daughter; Stroke in his father. There is no history of Heart attack or Hypertension.    ROS:  Please see the history of present illness.   Otherwise, review of systems are positive for Fatigue.   All other systems are reviewed and negative.    PHYSICAL EXAM: VS:  BP 110/60 mmHg  Pulse 88  Ht 5\' 10"  (1.778 m)  Wt 190 lb 1.9 oz (86.238 kg)  BMI 27.28 kg/m2 , BMI Body mass index is 27.28 kg/(m^2). GEN: Well nourished,  well developed, in no acute distress HEENT: normal Neck: no JVD, carotid bruits, or masses Cardiac: RRR; soft early systolic murmur, no rubs, or gallops,no edema  Respiratory:  clear to auscultation bilaterally, normal work of breathing GI: soft, nontender, nondistended, + BS MS: no deformity or atrophy Skin: warm and dry, no rash Neuro:  Strength and sensation are intact Psych: euthymic mood, full affect   EKG:   The ekg ordered today demonstrates sinus rhythm with prolonged PR interval   Recent Labs: 08/05/2015: Magnesium 2.1; TSH 3.779 08/06/2015: ALT 16*; BUN 13; Creatinine, Ser 1.28*; Hemoglobin 13.5; Platelets 225; Potassium 3.9; Sodium 142   Lipid Panel    Component Value Date/Time   CHOL 186 05/21/2013 0323   TRIG 128 05/21/2013 0323   HDL 32* 05/21/2013 0323   CHOLHDL 5.8 05/21/2013 0323   VLDL 26 05/21/2013 0323   LDLCALC 128* 05/21/2013 0323     Other studies Reviewed: Additional studies/ records that were reviewed today with results demonstrating: Echocardiogram from 2015 reviewed.   ASSESSMENT AND PLAN:  1. Fatigue/aortic sclerosis: Echocardiogram in 2015 showed normal left jugular function and no evidence of aortic stenosis. The patient needs to try to stay well hydrated. No evidence of bradycardia at this time. He can continue low-dose metoprolol.  No sign of severe aortic stenosis on exam today. I suspect his fatigue is from his age and overall deconditioning. 2. Peripheral arterial disease: No evidence of significant carotid disease by most recent carotid Doppler. 3. If he has any further syncopal or presyncopal episodes, could consider outpatient heart monitor. Per the discharge summary, his telemetry was unremarkable while in the hospital. 4. Hypertension: Well controlled.   Current medicines are reviewed at length with the patient today.  The patient concerns regarding his medicines were addressed.  The following changes have been made:  No  change  Labs/ tests ordered today include:  No orders of the defined types were placed in this encounter.    Recommend 150 minutes/week of aerobic exercise Low fat, low carb, high fiber diet recommended  Disposition:   FU in prn   Signed, Larae Grooms, MD  09/18/2015 8:41 AM    [facility] [place], [place],   [address] Phone: [phone]; Fax: [phone]

## 2015-09-18 NOTE — Patient Instructions (Addendum)
Medication Instructions:  Same-no changes  Labwork: None  Testing/Procedures: None  Follow-Up: Your physician recommends that you schedule a follow-up appointment in: as needed      If you need a refill on your cardiac medications before your next appointment, please call your pharmacy.

## 2015-09-22 DIAGNOSIS — I70219 Atherosclerosis of native arteries of extremities with intermittent claudication, unspecified extremity: Secondary | ICD-10-CM | POA: Diagnosis not present

## 2015-09-22 DIAGNOSIS — I69354 Hemiplegia and hemiparesis following cerebral infarction affecting left non-dominant side: Secondary | ICD-10-CM | POA: Diagnosis not present

## 2015-09-22 DIAGNOSIS — R2689 Other abnormalities of gait and mobility: Secondary | ICD-10-CM | POA: Diagnosis not present

## 2015-09-22 DIAGNOSIS — I10 Essential (primary) hypertension: Secondary | ICD-10-CM | POA: Diagnosis not present

## 2015-09-22 DIAGNOSIS — E1151 Type 2 diabetes mellitus with diabetic peripheral angiopathy without gangrene: Secondary | ICD-10-CM | POA: Diagnosis not present

## 2015-09-22 DIAGNOSIS — G20 Parkinson's disease: Secondary | ICD-10-CM | POA: Diagnosis not present

## 2015-09-24 DIAGNOSIS — G20 Parkinson's disease: Secondary | ICD-10-CM | POA: Diagnosis not present

## 2015-09-24 DIAGNOSIS — I10 Essential (primary) hypertension: Secondary | ICD-10-CM | POA: Diagnosis not present

## 2015-09-24 DIAGNOSIS — I69354 Hemiplegia and hemiparesis following cerebral infarction affecting left non-dominant side: Secondary | ICD-10-CM | POA: Diagnosis not present

## 2015-09-24 DIAGNOSIS — R2689 Other abnormalities of gait and mobility: Secondary | ICD-10-CM | POA: Diagnosis not present

## 2015-09-24 DIAGNOSIS — I70219 Atherosclerosis of native arteries of extremities with intermittent claudication, unspecified extremity: Secondary | ICD-10-CM | POA: Diagnosis not present

## 2015-09-24 DIAGNOSIS — E1151 Type 2 diabetes mellitus with diabetic peripheral angiopathy without gangrene: Secondary | ICD-10-CM | POA: Diagnosis not present

## 2015-09-29 DIAGNOSIS — G20 Parkinson's disease: Secondary | ICD-10-CM | POA: Diagnosis not present

## 2015-09-29 DIAGNOSIS — R2689 Other abnormalities of gait and mobility: Secondary | ICD-10-CM | POA: Diagnosis not present

## 2015-09-29 DIAGNOSIS — E1151 Type 2 diabetes mellitus with diabetic peripheral angiopathy without gangrene: Secondary | ICD-10-CM | POA: Diagnosis not present

## 2015-09-29 DIAGNOSIS — I70219 Atherosclerosis of native arteries of extremities with intermittent claudication, unspecified extremity: Secondary | ICD-10-CM | POA: Diagnosis not present

## 2015-09-29 DIAGNOSIS — I69354 Hemiplegia and hemiparesis following cerebral infarction affecting left non-dominant side: Secondary | ICD-10-CM | POA: Diagnosis not present

## 2015-09-29 DIAGNOSIS — I10 Essential (primary) hypertension: Secondary | ICD-10-CM | POA: Diagnosis not present

## 2015-10-01 DIAGNOSIS — I70219 Atherosclerosis of native arteries of extremities with intermittent claudication, unspecified extremity: Secondary | ICD-10-CM | POA: Diagnosis not present

## 2015-10-01 DIAGNOSIS — G20 Parkinson's disease: Secondary | ICD-10-CM | POA: Diagnosis not present

## 2015-10-01 DIAGNOSIS — I69354 Hemiplegia and hemiparesis following cerebral infarction affecting left non-dominant side: Secondary | ICD-10-CM | POA: Diagnosis not present

## 2015-10-01 DIAGNOSIS — I10 Essential (primary) hypertension: Secondary | ICD-10-CM | POA: Diagnosis not present

## 2015-10-01 DIAGNOSIS — R2689 Other abnormalities of gait and mobility: Secondary | ICD-10-CM | POA: Diagnosis not present

## 2015-10-01 DIAGNOSIS — E1151 Type 2 diabetes mellitus with diabetic peripheral angiopathy without gangrene: Secondary | ICD-10-CM | POA: Diagnosis not present

## 2015-10-03 DIAGNOSIS — Z961 Presence of intraocular lens: Secondary | ICD-10-CM | POA: Diagnosis not present

## 2015-10-03 DIAGNOSIS — Z0100 Encounter for examination of eyes and vision without abnormal findings: Secondary | ICD-10-CM | POA: Diagnosis not present

## 2015-10-06 DIAGNOSIS — I70219 Atherosclerosis of native arteries of extremities with intermittent claudication, unspecified extremity: Secondary | ICD-10-CM | POA: Diagnosis not present

## 2015-10-06 DIAGNOSIS — E1151 Type 2 diabetes mellitus with diabetic peripheral angiopathy without gangrene: Secondary | ICD-10-CM | POA: Diagnosis not present

## 2015-10-06 DIAGNOSIS — G20 Parkinson's disease: Secondary | ICD-10-CM | POA: Diagnosis not present

## 2015-10-06 DIAGNOSIS — I10 Essential (primary) hypertension: Secondary | ICD-10-CM | POA: Diagnosis not present

## 2015-10-06 DIAGNOSIS — R2689 Other abnormalities of gait and mobility: Secondary | ICD-10-CM | POA: Diagnosis not present

## 2015-10-06 DIAGNOSIS — I69354 Hemiplegia and hemiparesis following cerebral infarction affecting left non-dominant side: Secondary | ICD-10-CM | POA: Diagnosis not present

## 2015-10-13 DIAGNOSIS — E1151 Type 2 diabetes mellitus with diabetic peripheral angiopathy without gangrene: Secondary | ICD-10-CM | POA: Diagnosis not present

## 2015-10-13 DIAGNOSIS — I69354 Hemiplegia and hemiparesis following cerebral infarction affecting left non-dominant side: Secondary | ICD-10-CM | POA: Diagnosis not present

## 2015-10-13 DIAGNOSIS — I70219 Atherosclerosis of native arteries of extremities with intermittent claudication, unspecified extremity: Secondary | ICD-10-CM | POA: Diagnosis not present

## 2015-10-13 DIAGNOSIS — G20 Parkinson's disease: Secondary | ICD-10-CM | POA: Diagnosis not present

## 2015-10-13 DIAGNOSIS — R2689 Other abnormalities of gait and mobility: Secondary | ICD-10-CM | POA: Diagnosis not present

## 2015-10-13 DIAGNOSIS — I10 Essential (primary) hypertension: Secondary | ICD-10-CM | POA: Diagnosis not present

## 2015-10-20 DIAGNOSIS — E1151 Type 2 diabetes mellitus with diabetic peripheral angiopathy without gangrene: Secondary | ICD-10-CM | POA: Diagnosis not present

## 2015-10-20 DIAGNOSIS — G20 Parkinson's disease: Secondary | ICD-10-CM | POA: Diagnosis not present

## 2015-10-20 DIAGNOSIS — I69354 Hemiplegia and hemiparesis following cerebral infarction affecting left non-dominant side: Secondary | ICD-10-CM | POA: Diagnosis not present

## 2015-10-20 DIAGNOSIS — I70219 Atherosclerosis of native arteries of extremities with intermittent claudication, unspecified extremity: Secondary | ICD-10-CM | POA: Diagnosis not present

## 2015-10-20 DIAGNOSIS — I10 Essential (primary) hypertension: Secondary | ICD-10-CM | POA: Diagnosis not present

## 2015-10-20 DIAGNOSIS — R2689 Other abnormalities of gait and mobility: Secondary | ICD-10-CM | POA: Diagnosis not present

## 2015-12-12 ENCOUNTER — Other Ambulatory Visit (HOSPITAL_COMMUNITY): Payer: Self-pay | Admitting: Otolaryngology

## 2015-12-12 DIAGNOSIS — R1310 Dysphagia, unspecified: Secondary | ICD-10-CM

## 2015-12-12 DIAGNOSIS — H6121 Impacted cerumen, right ear: Secondary | ICD-10-CM | POA: Diagnosis not present

## 2015-12-18 ENCOUNTER — Inpatient Hospital Stay (HOSPITAL_COMMUNITY): Admission: RE | Admit: 2015-12-18 | Payer: Medicare Other | Source: Ambulatory Visit

## 2015-12-30 ENCOUNTER — Other Ambulatory Visit (HOSPITAL_COMMUNITY): Payer: Self-pay | Admitting: Otolaryngology

## 2015-12-30 DIAGNOSIS — R1310 Dysphagia, unspecified: Secondary | ICD-10-CM

## 2016-01-01 ENCOUNTER — Encounter: Payer: Self-pay | Admitting: Family

## 2016-01-06 ENCOUNTER — Ambulatory Visit (INDEPENDENT_AMBULATORY_CARE_PROVIDER_SITE_OTHER): Payer: Medicare Other | Admitting: Family

## 2016-01-06 ENCOUNTER — Ambulatory Visit (HOSPITAL_COMMUNITY)
Admission: RE | Admit: 2016-01-06 | Discharge: 2016-01-06 | Disposition: A | Discharge status: Home / Self Care | Payer: Medicare Other | Source: Ambulatory Visit | Attending: Family | Admitting: Family

## 2016-01-06 ENCOUNTER — Encounter: Payer: Self-pay | Admitting: Family

## 2016-01-06 VITALS — BP 145/76 | HR 71 | Temp 98.3°F | Resp 20 | Ht 70.0 in | Wt 190.0 lb

## 2016-01-06 DIAGNOSIS — Z48812 Encounter for surgical aftercare following surgery on the circulatory system: Secondary | ICD-10-CM

## 2016-01-06 DIAGNOSIS — Z9889 Other specified postprocedural states: Secondary | ICD-10-CM

## 2016-01-06 DIAGNOSIS — Z8673 Personal history of transient ischemic attack (TIA), and cerebral infarction without residual deficits: Secondary | ICD-10-CM | POA: Diagnosis not present

## 2016-01-06 DIAGNOSIS — I6522 Occlusion and stenosis of left carotid artery: Secondary | ICD-10-CM | POA: Diagnosis not present

## 2016-01-06 DIAGNOSIS — I6523 Occlusion and stenosis of bilateral carotid arteries: Secondary | ICD-10-CM

## 2016-01-06 LAB — VAS US CAROTID
LCCADDIAS: 14 cm/s
LCCADSYS: 153 cm/s
LCCAPDIAS: 14 cm/s
LEFT ECA DIAS: 6 cm/s
LEFT VERTEBRAL DIAS: 22 cm/s
Left CCA prox sys: 74 cm/s
Left ICA dist dias: -12 cm/s
Left ICA dist sys: -77 cm/s
RCCAPDIAS: 12 cm/s
RCCAPSYS: 110 cm/s
RIGHT CCA MID DIAS: 10 cm/s
RIGHT ECA DIAS: -2 cm/s
RIGHT VERTEBRAL DIAS: -7 cm/s
Right cca dist sys: -61 cm/s

## 2016-01-06 NOTE — Patient Instructions (Signed)
Stroke Prevention Some medical conditions and behaviors are associated with an increased chance of having a stroke. You may prevent a stroke by making healthy choices and managing medical conditions. HOW CAN I REDUCE MY RISK OF HAVING A STROKE?   Stay physically active. Get at least 30 minutes of activity on most or all days.  Do not smoke. It may also be helpful to avoid exposure to secondhand smoke.  Limit alcohol use. Moderate alcohol use is considered to be:  No more than 2 drinks per day for men.  No more than 1 drink per day for nonpregnant women.  Eat healthy foods. This involves:  Eating 5 or more servings of fruits and vegetables a day.  Making dietary changes that address high blood pressure (hypertension), high cholesterol, diabetes, or obesity.  Manage your cholesterol levels.  Making food choices that are high in fiber and low in saturated fat, trans fat, and cholesterol may control cholesterol levels.  Take any prescribed medicines to control cholesterol as directed by your health care provider.  Manage your diabetes.  Controlling your carbohydrate and sugar intake is recommended to manage diabetes.  Take any prescribed medicines to control diabetes as directed by your health care provider.  Control your hypertension.  Making food choices that are low in salt (sodium), saturated fat, trans fat, and cholesterol is recommended to manage hypertension.  Ask your health care provider if you need treatment to lower your blood pressure. Take any prescribed medicines to control hypertension as directed by your health care provider.  If you are 18-39 years of age, have your blood pressure checked every 3-5 years. If you are 40 years of age or older, have your blood pressure checked every year.  Maintain a healthy weight.  Reducing calorie intake and making food choices that are low in sodium, saturated fat, trans fat, and cholesterol are recommended to manage  weight.  Stop drug abuse.  Avoid taking birth control pills.  Talk to your health care provider about the risks of taking birth control pills if you are over 35 years old, smoke, get migraines, or have ever had a blood clot.  Get evaluated for sleep disorders (sleep apnea).  Talk to your health care provider about getting a sleep evaluation if you snore a lot or have excessive sleepiness.  Take medicines only as directed by your health care provider.  For some people, aspirin or blood thinners (anticoagulants) are helpful in reducing the risk of forming abnormal blood clots that can lead to stroke. If you have the irregular heart rhythm of atrial fibrillation, you should be on a blood thinner unless there is a good reason you cannot take them.  Understand all your medicine instructions.  Make sure that other conditions (such as anemia or atherosclerosis) are addressed. SEEK IMMEDIATE MEDICAL CARE IF:   You have sudden weakness or numbness of the face, arm, or leg, especially on one side of the body.  Your face or eyelid droops to one side.  You have sudden confusion.  You have trouble speaking (aphasia) or understanding.  You have sudden trouble seeing in one or both eyes.  You have sudden trouble walking.  You have dizziness.  You have a loss of balance or coordination.  You have a sudden, severe headache with no known cause.  You have new chest pain or an irregular heartbeat. Any of these symptoms may represent a serious problem that is an emergency. Do not wait to see if the symptoms will   go away. Get medical help at once. Call your local emergency services (911 in [place] Do not drive yourself to the hospital.   This information is not intended to replace advice given to you by your health care provider. Make sure you discuss any questions you have with your health care provider.   Document Released: 05/13/2004 Document Revised: 04/26/2014 Document Reviewed:  10/06/2012 Elsevier Interactive Patient Education 2016 Elsevier Inc.  

## 2016-01-06 NOTE — Progress Notes (Signed)
Chief Complaint: Follow up Extracranial Carotid Artery Stenosis   History of Present Illness  Zachary [name] is a 82 y.o. male patient of Dr. [name] who is s/p right CEA 05/25/13.  He returns today for f/u carotid duplex scan. Pt denies amaurosis fugax, paresthesias, or hemiparesis. He states that he was in the hospital in April of 2015 and was told that he may have had had a stroke but states he does not recall any stroke symptoms.  Review of records: 05/22/2013 MRI of the brain: Acute posterior right frontal infarction, moderate small vessel disease, and moderate atrophy. No acute cerebral infarct since then on review of records.   He continues to have generalized weakness, otherwise has done well. He does have some blurry vision bilaterally.   He is on a statin for his cholesterol. He is on a beta blocker for his hypertension.   He has had bilateral cataract extraction with lens implants. Dr. Wells Guiles Tat, neurologist,  01/07/14 progress notes exam: "The right pupil is surgical and not reactive. The left pupil is round and nonreactive. Funduscopic exam reveals a clear disc margin on the right. No red reflex is even identified on the left and cannot view fundus on t"he left. Extraocular muscles are intact".  He has not fallen again as he did just prior to his last visit; states he was evaluated in the ED at that time, states no significant injury found.   The patient denies new surgical history.  Pt Diabetic: no Pt smoker: former smoker, quit in 1964  Pt meds include: Statin : yes ASA: no Other anticoagulants/antiplatelets: no   Past Medical History:  Diagnosis Date  . Carotid artery occlusion   . Diabetes mellitus without complication ([place])   . Heart block   . Hypertension   . Insomnia   . Pancreatitis   . Prostate enlargement   . PVC's (premature ventricular contractions)   . PVD (peripheral vascular disease) ([place])   . Stroke [facility])     Social  History Social History  Substance Use Topics  . Smoking status: Former Smoker    Packs/day: 3.00    Years: 20.00    Quit date: 04/19/1962  . Smokeless tobacco: Never Used  . Alcohol use No    Family History Family History  Problem Relation Age of Onset  . Breast cancer Daughter   . Cancer Daughter 1    died of cancer   . Stroke Father   . Other      hypogonadism  . Heart attack Neg Hx   . Hypertension Neg Hx     Surgical History Past Surgical History:  Procedure Laterality Date  . CATARACT EXTRACTION W/ INTRAOCULAR LENS  IMPLANT, BILATERAL    . CHOLECYSTECTOMY  07/18/2011   Procedure: LAPAROSCOPIC CHOLECYSTECTOMY;  Surgeon: Gwenyth Ober, MD;  Location: [place];  Service: General;  Laterality: N/A;  . ENDARTERECTOMY Right 05/28/2013   Procedure: ENDARTERECTOMY CAROTID;  Surgeon: Rosetta Posner, MD;  Location: [place];  Service: Vascular;  Laterality: Right;  . FLEXIBLE SIGMOIDOSCOPY N/A 09/23/2014   Procedure: FLEXIBLE SIGMOIDOSCOPY;  Surgeon: Garlan Fair, MD;  [facility] ENDOSCOPY;  Service: Endoscopy;  Laterality: N/A;    Allergies  Allergen Reactions  . Penicillins Rash    Has patient had a PCN reaction causing immediate rash, facial/tongue/throat swelling, SOB or lightheadedness with hypotension: No Has patient had a PCN reaction causing severe rash involving mucus membranes or skin necrosis: No Has patient had a PCN reaction  that required hospitalization No Has patient had a PCN reaction occurring within the last 10 years: No If all of the above answers are "NO", then may proceed with Cephalosporin use.    Current Outpatient Prescriptions  Medication Sig Dispense Refill  . atorvastatin (LIPITOR) 10 MG tablet Take 1 tablet (10 mg total) by mouth daily.    . ergocalciferol (VITAMIN D2) 50000 UNITS capsule Take 50,000 Units by mouth once a week. Friday    . fluticasone (FLONASE) 50 MCG/ACT nasal spray Place 1 spray into both nostrils daily as needed for allergies or  rhinitis.    . Memantine HCl ER (NAMENDA XR) 28 MG CP24 Take 28 mg by mouth daily.     . metoprolol tartrate (LOPRESSOR) 25 MG tablet Take 0.5 tablets (12.5 mg total) by mouth 2 (two) times daily.    . Multiple Vitamin (MULTIVITAMIN WITH MINERALS) TABS Take 1 tablet by mouth daily.    Marland Kitchen omeprazole (PRILOSEC) 20 MG capsule Take 20 mg by mouth daily.    . pentoxifylline (TRENTAL) 400 MG CR tablet Take 400 mg by mouth 3 (three) times daily with meals.    . sertraline (ZOLOFT) 50 MG tablet Take 50 mg by mouth daily.    . Tamsulosin HCl (FLOMAX) 0.4 MG CAPS Take 0.4 mg by mouth daily.     Marland Kitchen zolpidem (AMBIEN) 5 MG tablet Take 5 mg by mouth at bedtime as needed for sleep.     No current facility-administered medications for this visit.     Review of Systems : See HPI for pertinent positives and negatives.  Physical Examination  Vitals:   01/06/16 1035 01/06/16 1038  BP: (!) 152/84 (!) 145/76  Pulse: 71   Resp: 20   Temp: 98.3 F (36.8 C)   TempSrc: Oral   SpO2: 94%   Weight: 190 lb (86.2 kg)   Height: 5\' 10"  (1.778 m)    Body mass index is 27.26 kg/m.  General: WDWN male in NAD GAIT: seated in wheelchair Eyes: right pupil is larger than left pupil and is irregular in shape, is non reactive, left pupil is round and constricts to light, unchanged from prior exam. Pulmonary: Respirations are non-labored, CTAB, no rales, rhonchi, or wheezing.  Cardiac: regular rhythm,  no detected murmur.  VASCULAR EXAM Carotid Bruits Right Left   Negative Negative    Aorta is not palpable. Radial pulses are 2+ palpable and equal.                                                                                                                                          LE Pulses Right Left       FEMORAL   palpable  not palpable       POPLITEAL  not palpable  not palpable       POSTERIOR TIBIAL  not palpable   palpable  DORSALIS PEDIS      ANTERIOR TIBIAL  palpable not palpable     Gastrointestinal: soft, nontender, BS WNL, no r/g,  no palpable masses.  Musculoskeletal: No muscle atrophy/wasting. M/S 5/5 throughout, extremities without ischemic changes.  Neurologic: A&O X 3; Appropriate Affect, Speech is normal CN 2-12 intact except right pupil is irregular in shape and nonreactive, pain and light touch intact in extremities except mildly decreased sensation right facial cheek, Motor exam as listed above.     Assessment: PONG PAYE is a 82 y.o. male who is s/p right CEA 05/25/13. 05/22/2013 MRI of the brain: Acute posterior right frontal infarction, moderate small vessel disease, and moderate atrophy. No acute cerebral infarct since then on review of records, he denies any subsequent stoke or TIA.  He has a caretaker with him. He has not fallen since 3 days before his last visit with our practice.  His right pupil is irregular in shape and non reactive to light, but his neurologist exam note from 01/07/14 indicates right pupil is surgical and not reactive. He has a history of cataract extraction with IOL implant.   DATA Today's carotid Duplex suggests bilateral common carotid artery disease present of less than 50%. Right internal carotid artery is patent with history of carotid endarterectomy, mild heterogeneous plaque present suggestive of less than 40% stenosis. Left internal carotid artery stenosis present at 40-59%. Vertebral arteries are patent and antegrade, both subclavian arteries are multiphasic and normal.  Increased stenosis in the left iCA compared to a year ago.     Plan: Follow-up in 1 year with Carotid Duplex scan.   I discussed in depth with the patient the nature of atherosclerosis, and emphasized the importance of maximal medical management including strict control of blood pressure, blood glucose, and lipid levels, obtaining regular exercise, and continued cessation of smoking.  The patient is aware that without maximal medical  management the underlying atherosclerotic disease process will progress, limiting the benefit of any interventions. The patient was given information about stroke prevention and what symptoms should prompt the patient to seek immediate medical care. Thank you for allowing [place] to participate in this patient's care.  Zachary Chambers, RN, MSN, FNP-C [facility] Office: [phone]  Clinic Physician: Early  01/06/16 11:14 AM

## 2016-01-08 ENCOUNTER — Ambulatory Visit (HOSPITAL_COMMUNITY)
Admission: RE | Admit: 2016-01-08 | Discharge: 2016-01-08 | Disposition: A | Discharge status: Home / Self Care | Payer: Medicare Other | Source: Ambulatory Visit | Attending: Otolaryngology | Admitting: Otolaryngology

## 2016-01-08 DIAGNOSIS — R1310 Dysphagia, unspecified: Secondary | ICD-10-CM

## 2016-01-08 DIAGNOSIS — K222 Esophageal obstruction: Secondary | ICD-10-CM | POA: Insufficient documentation

## 2016-01-12 DIAGNOSIS — R1310 Dysphagia, unspecified: Secondary | ICD-10-CM | POA: Diagnosis not present

## 2016-02-27 DIAGNOSIS — E559 Vitamin D deficiency, unspecified: Secondary | ICD-10-CM | POA: Diagnosis not present

## 2016-02-27 DIAGNOSIS — I69959 Hemiplegia and hemiparesis following unspecified cerebrovascular disease affecting unspecified side: Secondary | ICD-10-CM | POA: Diagnosis not present

## 2016-02-27 DIAGNOSIS — G20 Parkinson's disease: Secondary | ICD-10-CM | POA: Diagnosis not present

## 2016-02-27 DIAGNOSIS — I739 Peripheral vascular disease, unspecified: Secondary | ICD-10-CM | POA: Diagnosis not present

## 2016-02-27 DIAGNOSIS — I70219 Atherosclerosis of native arteries of extremities with intermittent claudication, unspecified extremity: Secondary | ICD-10-CM | POA: Diagnosis not present

## 2016-02-27 DIAGNOSIS — E119 Type 2 diabetes mellitus without complications: Secondary | ICD-10-CM | POA: Diagnosis not present

## 2016-02-27 DIAGNOSIS — F329 Major depressive disorder, single episode, unspecified: Secondary | ICD-10-CM | POA: Diagnosis not present

## 2016-02-27 DIAGNOSIS — Z23 Encounter for immunization: Secondary | ICD-10-CM | POA: Diagnosis not present

## 2016-02-27 DIAGNOSIS — E785 Hyperlipidemia, unspecified: Secondary | ICD-10-CM | POA: Diagnosis not present

## 2016-02-27 DIAGNOSIS — K219 Gastro-esophageal reflux disease without esophagitis: Secondary | ICD-10-CM | POA: Diagnosis not present

## 2016-03-02 DIAGNOSIS — E785 Hyperlipidemia, unspecified: Secondary | ICD-10-CM | POA: Diagnosis not present

## 2016-03-25 NOTE — Addendum Note (Signed)
Addended by: Lianne Cure A on: 03/25/2016 09:40 AM   Modules accepted: Orders

## 2016-05-27 DIAGNOSIS — E559 Vitamin D deficiency, unspecified: Secondary | ICD-10-CM | POA: Diagnosis not present

## 2016-06-02 DIAGNOSIS — I69959 Hemiplegia and hemiparesis following unspecified cerebrovascular disease affecting unspecified side: Secondary | ICD-10-CM | POA: Diagnosis not present

## 2016-06-02 DIAGNOSIS — Z1389 Encounter for screening for other disorder: Secondary | ICD-10-CM | POA: Diagnosis not present

## 2016-06-02 DIAGNOSIS — F329 Major depressive disorder, single episode, unspecified: Secondary | ICD-10-CM | POA: Diagnosis not present

## 2016-06-02 DIAGNOSIS — E1065 Type 1 diabetes mellitus with hyperglycemia: Secondary | ICD-10-CM | POA: Diagnosis not present

## 2016-06-02 DIAGNOSIS — E559 Vitamin D deficiency, unspecified: Secondary | ICD-10-CM | POA: Diagnosis not present

## 2016-06-02 DIAGNOSIS — K219 Gastro-esophageal reflux disease without esophagitis: Secondary | ICD-10-CM | POA: Diagnosis not present

## 2016-06-02 DIAGNOSIS — B351 Tinea unguium: Secondary | ICD-10-CM | POA: Diagnosis not present

## 2016-06-02 DIAGNOSIS — I739 Peripheral vascular disease, unspecified: Secondary | ICD-10-CM | POA: Diagnosis not present

## 2016-06-02 DIAGNOSIS — E785 Hyperlipidemia, unspecified: Secondary | ICD-10-CM | POA: Diagnosis not present

## 2016-06-02 DIAGNOSIS — G20 Parkinson's disease: Secondary | ICD-10-CM | POA: Diagnosis not present

## 2016-06-02 DIAGNOSIS — J069 Acute upper respiratory infection, unspecified: Secondary | ICD-10-CM | POA: Diagnosis not present

## 2016-06-02 DIAGNOSIS — Z0000 Encounter for general adult medical examination without abnormal findings: Secondary | ICD-10-CM | POA: Diagnosis not present

## 2016-06-02 DIAGNOSIS — I779 Disorder of arteries and arterioles, unspecified: Secondary | ICD-10-CM | POA: Diagnosis not present

## 2016-06-02 DIAGNOSIS — N400 Enlarged prostate without lower urinary tract symptoms: Secondary | ICD-10-CM | POA: Diagnosis not present

## 2016-06-14 ENCOUNTER — Ambulatory Visit: Payer: Medicare Other | Admitting: Podiatry

## 2016-06-17 DIAGNOSIS — M6282 Rhabdomyolysis: Secondary | ICD-10-CM

## 2016-06-17 HISTORY — DX: Rhabdomyolysis: M62.82

## 2016-06-28 ENCOUNTER — Ambulatory Visit: Payer: Medicare Other | Admitting: Podiatry

## 2016-06-29 ENCOUNTER — Inpatient Hospital Stay (HOSPITAL_COMMUNITY): Payer: Medicare Other

## 2016-06-29 ENCOUNTER — Encounter (HOSPITAL_COMMUNITY): Payer: Self-pay | Admitting: *Deleted

## 2016-06-29 ENCOUNTER — Emergency Department (HOSPITAL_COMMUNITY): Payer: Medicare Other

## 2016-06-29 ENCOUNTER — Inpatient Hospital Stay (HOSPITAL_COMMUNITY)
Admission: EM | Admit: 2016-06-29 | Discharge: 2016-07-02 | DRG: 065 | Disposition: A | Discharge status: Skilled Nursing Facility | Payer: Medicare Other | Attending: Nephrology | Admitting: Nephrology

## 2016-06-29 DIAGNOSIS — G8191 Hemiplegia, unspecified affecting right dominant side: Secondary | ICD-10-CM | POA: Diagnosis present

## 2016-06-29 DIAGNOSIS — F0390 Unspecified dementia without behavioral disturbance: Secondary | ICD-10-CM | POA: Diagnosis not present

## 2016-06-29 DIAGNOSIS — Z87891 Personal history of nicotine dependence: Secondary | ICD-10-CM | POA: Diagnosis not present

## 2016-06-29 DIAGNOSIS — M1990 Unspecified osteoarthritis, unspecified site: Secondary | ICD-10-CM | POA: Diagnosis present

## 2016-06-29 DIAGNOSIS — R471 Dysarthria and anarthria: Secondary | ICD-10-CM | POA: Diagnosis present

## 2016-06-29 DIAGNOSIS — I491 Atrial premature depolarization: Secondary | ICD-10-CM | POA: Diagnosis not present

## 2016-06-29 DIAGNOSIS — I639 Cerebral infarction, unspecified: Secondary | ICD-10-CM | POA: Diagnosis not present

## 2016-06-29 DIAGNOSIS — Z9889 Other specified postprocedural states: Secondary | ICD-10-CM

## 2016-06-29 DIAGNOSIS — K219 Gastro-esophageal reflux disease without esophagitis: Secondary | ICD-10-CM | POA: Diagnosis present

## 2016-06-29 DIAGNOSIS — R2990 Unspecified symptoms and signs involving the nervous system: Secondary | ICD-10-CM | POA: Diagnosis not present

## 2016-06-29 DIAGNOSIS — E889 Metabolic disorder, unspecified: Secondary | ICD-10-CM | POA: Diagnosis not present

## 2016-06-29 DIAGNOSIS — E860 Dehydration: Secondary | ICD-10-CM | POA: Diagnosis present

## 2016-06-29 DIAGNOSIS — E785 Hyperlipidemia, unspecified: Secondary | ICD-10-CM | POA: Diagnosis present

## 2016-06-29 DIAGNOSIS — N400 Enlarged prostate without lower urinary tract symptoms: Secondary | ICD-10-CM | POA: Diagnosis present

## 2016-06-29 DIAGNOSIS — R001 Bradycardia, unspecified: Secondary | ICD-10-CM | POA: Diagnosis present

## 2016-06-29 DIAGNOSIS — Z8673 Personal history of transient ischemic attack (TIA), and cerebral infarction without residual deficits: Secondary | ICD-10-CM

## 2016-06-29 DIAGNOSIS — Z79899 Other long term (current) drug therapy: Secondary | ICD-10-CM | POA: Diagnosis not present

## 2016-06-29 DIAGNOSIS — M6282 Rhabdomyolysis: Secondary | ICD-10-CM | POA: Diagnosis not present

## 2016-06-29 DIAGNOSIS — S299XXA Unspecified injury of thorax, initial encounter: Secondary | ICD-10-CM | POA: Diagnosis not present

## 2016-06-29 DIAGNOSIS — G4700 Insomnia, unspecified: Secondary | ICD-10-CM | POA: Diagnosis present

## 2016-06-29 DIAGNOSIS — E7800 Pure hypercholesterolemia, unspecified: Secondary | ICD-10-CM | POA: Diagnosis present

## 2016-06-29 DIAGNOSIS — M24411 Recurrent dislocation, right shoulder: Secondary | ICD-10-CM | POA: Diagnosis present

## 2016-06-29 DIAGNOSIS — I10 Essential (primary) hypertension: Secondary | ICD-10-CM | POA: Diagnosis not present

## 2016-06-29 DIAGNOSIS — Z961 Presence of intraocular lens: Secondary | ICD-10-CM | POA: Diagnosis present

## 2016-06-29 DIAGNOSIS — Z880 Allergy status to penicillin: Secondary | ICD-10-CM

## 2016-06-29 DIAGNOSIS — E119 Type 2 diabetes mellitus without complications: Secondary | ICD-10-CM

## 2016-06-29 DIAGNOSIS — I6340 Cerebral infarction due to embolism of unspecified cerebral artery: Secondary | ICD-10-CM | POA: Insufficient documentation

## 2016-06-29 DIAGNOSIS — I63512 Cerebral infarction due to unspecified occlusion or stenosis of left middle cerebral artery: Principal | ICD-10-CM | POA: Diagnosis present

## 2016-06-29 DIAGNOSIS — R296 Repeated falls: Secondary | ICD-10-CM | POA: Diagnosis present

## 2016-06-29 DIAGNOSIS — G464 Cerebellar stroke syndrome: Secondary | ICD-10-CM | POA: Diagnosis not present

## 2016-06-29 DIAGNOSIS — I63412 Cerebral infarction due to embolism of left middle cerebral artery: Secondary | ICD-10-CM

## 2016-06-29 DIAGNOSIS — R4781 Slurred speech: Secondary | ICD-10-CM

## 2016-06-29 DIAGNOSIS — D72829 Elevated white blood cell count, unspecified: Secondary | ICD-10-CM

## 2016-06-29 DIAGNOSIS — I6789 Other cerebrovascular disease: Secondary | ICD-10-CM | POA: Diagnosis not present

## 2016-06-29 DIAGNOSIS — F0150 Vascular dementia without behavioral disturbance: Secondary | ICD-10-CM | POA: Diagnosis not present

## 2016-06-29 DIAGNOSIS — E876 Hypokalemia: Secondary | ICD-10-CM | POA: Diagnosis present

## 2016-06-29 DIAGNOSIS — R4701 Aphasia: Secondary | ICD-10-CM | POA: Diagnosis present

## 2016-06-29 DIAGNOSIS — Z8711 Personal history of peptic ulcer disease: Secondary | ICD-10-CM

## 2016-06-29 DIAGNOSIS — I6501 Occlusion and stenosis of right vertebral artery: Secondary | ICD-10-CM | POA: Diagnosis present

## 2016-06-29 DIAGNOSIS — W19XXXA Unspecified fall, initial encounter: Secondary | ICD-10-CM | POA: Diagnosis present

## 2016-06-29 DIAGNOSIS — E118 Type 2 diabetes mellitus with unspecified complications: Secondary | ICD-10-CM | POA: Insufficient documentation

## 2016-06-29 DIAGNOSIS — Z9841 Cataract extraction status, right eye: Secondary | ICD-10-CM

## 2016-06-29 DIAGNOSIS — Z7189 Other specified counseling: Secondary | ICD-10-CM

## 2016-06-29 DIAGNOSIS — Z66 Do not resuscitate: Secondary | ICD-10-CM | POA: Diagnosis present

## 2016-06-29 DIAGNOSIS — T796XXD Traumatic ischemia of muscle, subsequent encounter: Secondary | ICD-10-CM | POA: Diagnosis not present

## 2016-06-29 DIAGNOSIS — Z7984 Long term (current) use of oral hypoglycemic drugs: Secondary | ICD-10-CM

## 2016-06-29 DIAGNOSIS — N401 Enlarged prostate with lower urinary tract symptoms: Secondary | ICD-10-CM | POA: Diagnosis present

## 2016-06-29 DIAGNOSIS — I459 Conduction disorder, unspecified: Secondary | ICD-10-CM | POA: Diagnosis present

## 2016-06-29 DIAGNOSIS — R079 Chest pain, unspecified: Secondary | ICD-10-CM | POA: Diagnosis not present

## 2016-06-29 DIAGNOSIS — I63132 Cerebral infarction due to embolism of left carotid artery: Secondary | ICD-10-CM | POA: Diagnosis not present

## 2016-06-29 DIAGNOSIS — Z515 Encounter for palliative care: Secondary | ICD-10-CM | POA: Diagnosis not present

## 2016-06-29 DIAGNOSIS — Z8546 Personal history of malignant neoplasm of prostate: Secondary | ICD-10-CM

## 2016-06-29 DIAGNOSIS — R531 Weakness: Secondary | ICD-10-CM | POA: Diagnosis not present

## 2016-06-29 DIAGNOSIS — E1151 Type 2 diabetes mellitus with diabetic peripheral angiopathy without gangrene: Secondary | ICD-10-CM | POA: Diagnosis present

## 2016-06-29 DIAGNOSIS — Z9842 Cataract extraction status, left eye: Secondary | ICD-10-CM

## 2016-06-29 DIAGNOSIS — S4991XS Unspecified injury of right shoulder and upper arm, sequela: Secondary | ICD-10-CM | POA: Diagnosis not present

## 2016-06-29 DIAGNOSIS — R338 Other retention of urine: Secondary | ICD-10-CM | POA: Diagnosis present

## 2016-06-29 DIAGNOSIS — Z022 Encounter for examination for admission to residential institution: Secondary | ICD-10-CM | POA: Diagnosis not present

## 2016-06-29 DIAGNOSIS — Z823 Family history of stroke: Secondary | ICD-10-CM

## 2016-06-29 DIAGNOSIS — Z9049 Acquired absence of other specified parts of digestive tract: Secondary | ICD-10-CM

## 2016-06-29 DIAGNOSIS — I493 Ventricular premature depolarization: Secondary | ICD-10-CM | POA: Diagnosis present

## 2016-06-29 DIAGNOSIS — I6522 Occlusion and stenosis of left carotid artery: Secondary | ICD-10-CM

## 2016-06-29 DIAGNOSIS — I739 Peripheral vascular disease, unspecified: Secondary | ICD-10-CM | POA: Diagnosis present

## 2016-06-29 HISTORY — DX: Personal history of peptic ulcer disease: Z87.11

## 2016-06-29 HISTORY — DX: Personal history of other medical treatment: Z92.89

## 2016-06-29 HISTORY — DX: Unspecified osteoarthritis, unspecified site: M19.90

## 2016-06-29 HISTORY — DX: Type 2 diabetes mellitus without complications: E11.9

## 2016-06-29 HISTORY — DX: Personal history of other diseases of the digestive system: Z87.19

## 2016-06-29 HISTORY — DX: Headache, unspecified: R51.9

## 2016-06-29 HISTORY — DX: Gastro-esophageal reflux disease without esophagitis: K21.9

## 2016-06-29 HISTORY — DX: Headache: R51

## 2016-06-29 LAB — COMPREHENSIVE METABOLIC PANEL
ALT: 30 U/L (ref 17–63)
AST: 108 U/L — AB (ref 15–41)
Albumin: 3.7 g/dL (ref 3.5–5.0)
Alkaline Phosphatase: 60 U/L (ref 38–126)
Anion gap: 15 (ref 5–15)
BILIRUBIN TOTAL: 1.5 mg/dL — AB (ref 0.3–1.2)
BUN: 20 mg/dL (ref 6–20)
CO2: 22 mmol/L (ref 22–32)
CREATININE: 1.13 mg/dL (ref 0.61–1.24)
Calcium: 9.1 mg/dL (ref 8.9–10.3)
Chloride: 102 mmol/L (ref 101–111)
GFR, EST NON AFRICAN AMERICAN: 54 mL/min — AB (ref >60)
Glucose, Bld: 152 mg/dL — ABNORMAL HIGH (ref 65–99)
Potassium: 3.8 mmol/L (ref 3.5–5.1)
Sodium: 139 mmol/L (ref 135–145)
TOTAL PROTEIN: 6.8 g/dL (ref 6.5–8.1)

## 2016-06-29 LAB — DIFFERENTIAL
BASOS ABS: 0 10*3/uL (ref 0.0–0.1)
Basophils Relative: 0 %
EOS ABS: 0 10*3/uL (ref 0.0–0.7)
Eosinophils Relative: 0 %
LYMPHS ABS: 3.5 10*3/uL (ref 0.7–4.0)
Lymphocytes Relative: 16 %
MONO ABS: 1.9 10*3/uL — AB (ref 0.1–1.0)
Monocytes Relative: 9 %
Neutro Abs: 16.5 10*3/uL — ABNORMAL HIGH (ref 1.7–7.7)
Neutrophils Relative %: 75 %

## 2016-06-29 LAB — RAPID URINE DRUG SCREEN, HOSP PERFORMED
Amphetamines: NOT DETECTED
BARBITURATES: NOT DETECTED
Benzodiazepines: NOT DETECTED
Cocaine: NOT DETECTED
Opiates: NOT DETECTED
Tetrahydrocannabinol: NOT DETECTED

## 2016-06-29 LAB — URINALYSIS, ROUTINE W REFLEX MICROSCOPIC
BILIRUBIN URINE: NEGATIVE
Bacteria, UA: NONE SEEN
GLUCOSE, UA: 50 mg/dL — AB
Ketones, ur: 20 mg/dL — AB
LEUKOCYTES UA: NEGATIVE
Nitrite: NEGATIVE
PH: 5 (ref 5.0–8.0)
Protein, ur: 30 mg/dL — AB
Specific Gravity, Urine: 1.023 (ref 1.005–1.030)
Squamous Epithelial / LPF: NONE SEEN

## 2016-06-29 LAB — I-STAT CHEM 8, ED
BUN: 25 mg/dL — ABNORMAL HIGH (ref 6–20)
Calcium, Ion: 1.09 mmol/L — ABNORMAL LOW (ref 1.15–1.40)
Chloride: 105 mmol/L (ref 101–111)
Creatinine, Ser: 1 mg/dL (ref 0.61–1.24)
Glucose, Bld: 156 mg/dL — ABNORMAL HIGH (ref 65–99)
HCT: 48 % (ref 39.0–52.0)
Hemoglobin: 16.3 g/dL (ref 13.0–17.0)
Potassium: 3.8 mmol/L (ref 3.5–5.1)
Sodium: 142 mmol/L (ref 135–145)
TCO2: 25 mmol/L (ref 0–100)

## 2016-06-29 LAB — PROCALCITONIN: Procalcitonin: <0.1 ng/mL

## 2016-06-29 LAB — PROTIME-INR
INR: 1.1
Prothrombin Time: 14.3 seconds (ref 11.4–15.2)

## 2016-06-29 LAB — I-STAT TROPONIN, ED: TROPONIN I, POC: 0.03 ng/mL (ref 0.00–0.08)

## 2016-06-29 LAB — CBC
HEMATOCRIT: 46.4 % (ref 39.0–52.0)
HEMOGLOBIN: 16.3 g/dL (ref 13.0–17.0)
MCH: 32.3 pg (ref 26.0–34.0)
MCHC: 35.1 g/dL (ref 30.0–36.0)
MCV: 92.1 fL (ref 78.0–100.0)
Platelets: 277 10*3/uL (ref 150–400)
RBC: 5.04 MIL/uL (ref 4.22–5.81)
RDW: 13.2 % (ref 11.5–15.5)
WBC: 22 10*3/uL — ABNORMAL HIGH (ref 4.0–10.5)

## 2016-06-29 LAB — SEDIMENTATION RATE: SED RATE: 2 mm/h (ref 0–16)

## 2016-06-29 LAB — ETHANOL: Alcohol, Ethyl (B): <5 mg/dL (ref <5)

## 2016-06-29 LAB — LACTIC ACID, PLASMA: LACTIC ACID, VENOUS: 2.2 mmol/L — AB (ref 0.5–1.9)

## 2016-06-29 LAB — GLUCOSE, CAPILLARY
Glucose-Capillary: 120 mg/dL — ABNORMAL HIGH (ref 65–99)
Glucose-Capillary: 120 mg/dL — ABNORMAL HIGH (ref 65–99)

## 2016-06-29 LAB — CK: Total CK: 4388 U/L — ABNORMAL HIGH (ref 49–397)

## 2016-06-29 LAB — MAGNESIUM: MAGNESIUM: 1.8 mg/dL (ref 1.7–2.4)

## 2016-06-29 LAB — APTT: APTT: 31 s (ref 24–36)

## 2016-06-29 MED ORDER — METOPROLOL TARTRATE 12.5 MG HALF TABLET
12.5000 mg | ORAL_TABLET | Freq: Two times a day (BID) | ORAL | Status: DC
  Administered 2016-06-30: 12.5 mg via ORAL
  Filled 2016-06-29 (×2): qty 1

## 2016-06-29 MED ORDER — ENOXAPARIN SODIUM 40 MG/0.4ML ~~LOC~~ SOLN
40.0000 mg | SUBCUTANEOUS | Status: DC
  Administered 2016-06-29 – 2016-07-01 (×2): 40 mg via SUBCUTANEOUS
  Filled 2016-06-29 (×2): qty 0.4

## 2016-06-29 MED ORDER — SERTRALINE HCL 50 MG PO TABS
50.0000 mg | ORAL_TABLET | Freq: Every day | ORAL | Status: DC
  Administered 2016-06-29 – 2016-07-02 (×4): 50 mg via ORAL
  Filled 2016-06-29 (×4): qty 1

## 2016-06-29 MED ORDER — PANTOPRAZOLE SODIUM 40 MG PO TBEC
40.0000 mg | DELAYED_RELEASE_TABLET | Freq: Every day | ORAL | Status: DC
  Administered 2016-06-29 – 2016-07-02 (×4): 40 mg via ORAL
  Filled 2016-06-29 (×4): qty 1

## 2016-06-29 MED ORDER — ACETAMINOPHEN 650 MG RE SUPP: 650.0000 mg | RECTAL | Status: DC | PRN

## 2016-06-29 MED ORDER — IOPAMIDOL (ISOVUE-370) INJECTION 76%
50.0000 mL | Freq: Once | INTRAVENOUS | Status: AC | PRN
  Administered 2016-06-29: 50 mL via INTRAVENOUS

## 2016-06-29 MED ORDER — SODIUM CHLORIDE 0.9 % IV BOLUS (SEPSIS)
1000.0000 mL | Freq: Once | INTRAVENOUS | Status: AC
  Administered 2016-06-29: 1000 mL via INTRAVENOUS

## 2016-06-29 MED ORDER — ACETAMINOPHEN 325 MG PO TABS
650.0000 mg | ORAL_TABLET | ORAL | Status: DC | PRN
  Administered 2016-06-29: 650 mg via ORAL
  Filled 2016-06-29: qty 2

## 2016-06-29 MED ORDER — INSULIN ASPART 100 UNIT/ML ~~LOC~~ SOLN
0.0000 [IU] | Freq: Three times a day (TID) | SUBCUTANEOUS | Status: DC
  Administered 2016-06-30 – 2016-07-01 (×2): 1 [IU] via SUBCUTANEOUS
  Administered 2016-07-02: 2 [IU] via SUBCUTANEOUS

## 2016-06-29 MED ORDER — PENTOXIFYLLINE ER 400 MG PO TBCR
400.0000 mg | EXTENDED_RELEASE_TABLET | Freq: Three times a day (TID) | ORAL | Status: DC
  Administered 2016-06-30 – 2016-07-02 (×6): 400 mg via ORAL
  Filled 2016-06-29 (×10): qty 1

## 2016-06-29 MED ORDER — INSULIN ASPART 100 UNIT/ML ~~LOC~~ SOLN: 0.0000 [IU] | Freq: Every day | SUBCUTANEOUS | Status: DC

## 2016-06-29 MED ORDER — TAMSULOSIN HCL 0.4 MG PO CAPS
0.4000 mg | ORAL_CAPSULE | Freq: Every day | ORAL | Status: DC
  Administered 2016-06-29 – 2016-07-02 (×4): 0.4 mg via ORAL
  Filled 2016-06-29 (×4): qty 1

## 2016-06-29 MED ORDER — ASPIRIN 325 MG PO TABS
325.0000 mg | ORAL_TABLET | Freq: Every day | ORAL | Status: DC
  Administered 2016-06-29 – 2016-07-02 (×4): 325 mg via ORAL
  Filled 2016-06-29 (×4): qty 1

## 2016-06-29 MED ORDER — ASPIRIN 300 MG RE SUPP: 300.0000 mg | Freq: Every day | RECTAL | Status: DC

## 2016-06-29 MED ORDER — FLUTICASONE PROPIONATE 50 MCG/ACT NA SUSP
1.0000 | Freq: Every day | NASAL | Status: DC | PRN
  Filled 2016-06-29: qty 16

## 2016-06-29 MED ORDER — MEMANTINE HCL ER 28 MG PO CP24
28.0000 mg | ORAL_CAPSULE | Freq: Every day | ORAL | Status: DC
  Administered 2016-06-30 – 2016-07-02 (×3): 28 mg via ORAL
  Filled 2016-06-29 (×4): qty 1

## 2016-06-29 MED ORDER — ACETAMINOPHEN 160 MG/5ML PO SOLN: 650.0000 mg | ORAL | Status: DC | PRN

## 2016-06-29 MED ORDER — ATORVASTATIN CALCIUM 10 MG PO TABS
10.0000 mg | ORAL_TABLET | Freq: Every day | ORAL | Status: DC
  Administered 2016-06-29 – 2016-07-02 (×4): 10 mg via ORAL
  Filled 2016-06-29 (×4): qty 1

## 2016-06-29 MED ORDER — SODIUM CHLORIDE 0.9 % IV SOLN: INTRAVENOUS | Status: DC

## 2016-06-29 MED ORDER — CLOPIDOGREL BISULFATE 75 MG PO TABS
75.0000 mg | ORAL_TABLET | Freq: Every day | ORAL | Status: DC
  Administered 2016-06-29 – 2016-07-02 (×4): 75 mg via ORAL
  Filled 2016-06-29 (×4): qty 1

## 2016-06-29 MED ORDER — STROKE: EARLY STAGES OF RECOVERY BOOK
Freq: Once | Status: AC
  Administered 2016-06-29: 18:00:00
  Filled 2016-06-29 (×2): qty 1

## 2016-06-29 MED ORDER — SODIUM CHLORIDE 0.9 % IV SOLN
INTRAVENOUS | Status: DC
  Administered 2016-06-29: 18:00:00 via INTRAVENOUS
  Administered 2016-06-30: 1000 mL via INTRAVENOUS

## 2016-06-29 NOTE — H&P (Signed)
History and Physical    Zachary [name] DOB: [birth year] DOA: 06/29/2016   PCP: [name], MD   Patient coming from/Resides with: Private residence  Admission status: Inpatient/telemetry -medically necessary to stay a minimum 2 midnights to rule out impending and/or unexpected changes in physiologic status that may differ from initial evaluation performed in the ER and/or at time of admission. Patient presents with reported right-sided weakness for 3 weeks as well as recurrent falls and aphasia for several days. In the ER patient was found to be dehydrated with an elevated CK of 4388 with normal creatinine. Initial CT head negative. Patient has worsening of baseline chronic leukocytosis and uncertain at time of admission of patient has concomitant infectious process. Patient will require telemetry monitoring, frequent monitoring of neurological status, IV fluid bolus with continuous IV fluids, serial lab monitoring especially regarding elevation in CK, evaluation for occult infectious process, PT/OT evaluation given recurrent falls. Orthostatic vital signs requested prior to fluid bolus and administration of IV fluids. Patient may require eventual placement in skilled nursing facility.  Chief Complaint: Falls, aphasia and reported right-sided weakness  HPI: Zachary [name] is a 83 y.o. male with medical history significant for CVA affecting left side in 2015 in the setting of right carotid stenosis, status post right carotid endarterectomy in 2015, dementia, BPH with history of urinary retention, hypertension, ? Diabetes although last hemoglobin A1c 2015 was 6.6, chronic leukocytosis evaluated by hematology in the outpatient setting, peripheral vascular disease on medical therapy, dyslipidemia. Patient is a poor historian given his underlying dementia and no other family or friends at the bedside. History obtained from the chart and from the ER physician. Patient was brought  to the ER by caregiver. Apparently home health has been unable to come by to care for the patient in the past 24 hrs due to weather issues. Patient had an unwitnessed fall (although he reported to staff that he just sat down the floor because he was tired). It was reported he had slurred speech and weakness of the right arm. Last apparent normal/baseline was Sunday 48 hours prior. Upon my evaluation of the patient he was denying any focal pain. Question about recent falls patient repeatedly tells me "I haven't fallen in over 3 years".  ED Course: Vital Signs: BP 198/64   Pulse (!) 45   Temp 98.1 F (36.7 C)   Ht _0  (1.727 m)   Wt 86.2 kg (190 lb)   SpO2 97%   BMI 28.89 kg/m  CT head without contrast: Stable atrophy and moderate small vessel ischemic change and no acute intracranial abnormality 2 view CXR: Chronic acromioclavicular separation on the right otherwise no acute process Lab Data: Sodium 139, potassium 3.8, chloride 102, CO2 22, glucose 152, BUN 20, creatinine 1.13, anion gap 15, AST 108, total bilirubin 1.5, CK 4388, poc troponin 0.03, white count 22,000 with neutrophils 75%, absolute neutrophils 16.5%, normal lymphocytes, absolute monocytes 0.9%, hemoglobin 16.3, platelets 277,000, coags normal, alcohol level <5 Medications and treatments: None  Review of Systems:   **unable to obtain accurate history from patient given his dementia and inconsistent history during my exam; caregiver not at bedside   Past Medical History:  Diagnosis Date  . Carotid artery occlusion   . Diabetes mellitus without complication ([place])   . Heart block   . Hypertension   . Insomnia   . Pancreatitis   . Prostate enlargement   . PVC's (premature ventricular contractions)   . PVD (peripheral vascular  disease) ([place])   . Stroke [facility])     Past Surgical History:  Procedure Laterality Date  . CATARACT EXTRACTION W/ INTRAOCULAR LENS  IMPLANT, BILATERAL    . CHOLECYSTECTOMY  07/18/2011   Procedure:  LAPAROSCOPIC CHOLECYSTECTOMY;  Surgeon: Gwenyth Ober, MD;  Location: [facility];  Service: General;  Laterality: N/A;  . ENDARTERECTOMY Right 05/28/2013   Procedure: ENDARTERECTOMY CAROTID;  Surgeon: Rosetta Posner, MD;  Location: [facility];  Service: Vascular;  Laterality: Right;  . FLEXIBLE SIGMOIDOSCOPY N/A 09/23/2014   Procedure: FLEXIBLE SIGMOIDOSCOPY;  Surgeon: Garlan Fair, MD;  Location: [facility];  Service: Endoscopy;  Laterality: N/A;    Social History   Social History  . Marital status: Married    Spouse name: N/A  . Number of children: N/A  . Years of education: N/A   Occupational History  . retired     [place] History Main Topics  . Smoking status: Former Smoker    Packs/day: 3.00    Years: 20.00    Quit date: 04/19/1962  . Smokeless tobacco: Never Used  . Alcohol use No  . Drug use: No  . Sexual activity: Not Currently   Other Topics Concern  . Not on file   Social History Narrative  . No narrative on file    Mobility: Rolling walker Work history: Not obtained   Allergies  Allergen Reactions  . Penicillins Rash    Has patient had a PCN reaction causing immediate rash, facial/tongue/throat swelling, SOB or lightheadedness with hypotension: No Has patient had a PCN reaction causing severe rash involving mucus membranes or skin necrosis: No Has patient had a PCN reaction that required hospitalization No Has patient had a PCN reaction occurring within the last 10 years: No If all of the above answers are "NO", then may proceed with Cephalosporin use.    Family History  Problem Relation Age of Onset  . Breast cancer Daughter   . Cancer Daughter 18    died of cancer   . Stroke Father   . Other      hypogonadism  . Heart attack Neg Hx   . Hypertension Neg Hx    **Family history as above from prior admissions and imported into this note  Prior to Admission medications   Medication Sig Start Date End Date Taking? Authorizing Provider    atorvastatin (LIPITOR) 10 MG tablet Take 1 tablet (10 mg total) by mouth daily. 05/30/13   Marius Ditch, MD  ergocalciferol (VITAMIN D2) 50000 UNITS capsule Take 50,000 Units by mouth once a week. Friday    Historical Provider, MD  fluticasone (FLONASE) 50 MCG/ACT nasal spray Place 1 spray into both nostrils daily as needed for allergies or rhinitis.    Historical Provider, MD  Memantine HCl ER (NAMENDA XR) 28 MG CP24 Take 28 mg by mouth daily.     Historical Provider, MD  metoprolol tartrate (LOPRESSOR) 25 MG tablet Take 0.5 tablets (12.5 mg total) by mouth 2 (two) times daily. 05/25/13   Marius Ditch, MD  Multiple Vitamin (MULTIVITAMIN WITH MINERALS) TABS Take 1 tablet by mouth daily.    Historical Provider, MD  omeprazole (PRILOSEC) 20 MG capsule Take 20 mg by mouth daily.    Historical Provider, MD  pentoxifylline (TRENTAL) 400 MG CR tablet Take 400 mg by mouth 3 (three) times daily with meals.    Historical Provider, MD  sertraline (ZOLOFT) 50 MG tablet Take 50 mg by mouth daily.  Historical Provider, MD  Tamsulosin HCl (FLOMAX) 0.4 MG CAPS Take 0.4 mg by mouth daily.     Historical Provider, MD  zolpidem (AMBIEN) 5 MG tablet Take 5 mg by mouth at bedtime as needed for sleep.    Historical Provider, MD    Physical Exam: Vitals:   06/29/16 1105 06/29/16 1107 06/29/16 1130 06/29/16 1223  BP: (!) 183/50  198/64   Pulse: (!) 43  (!) 45   Temp:    98.1 F (36.7 C)  SpO2: 96%  97%   Weight:  86.2 kg (190 lb)    Height:  _0  (1.727 m)        Constitutional: NAD, Slightly restless, seems comfortable-appears unkempt noted he is unshaven Eyes: PERRL, lids and conjunctivae normal ENMT: Mucous membranes are dry. Posterior pharynx clear of any exudate or lesions.Normal dentition.  Neck: normal, supple, no masses, no thyromegaly Respiratory: clear to auscultation bilaterally, no wheezing, no crackles. Normal respiratory effort. No accessory muscle use.  Cardiovascular: Irregular rate  and rhythm with frequent PACs, no murmurs / rubs / gallops. No extremity edema. 2+ pedal pulses. No carotid bruits.  Abdomen: no tenderness, no masses palpated. No hepatosplenomegaly. Bowel sounds positive.  Musculoskeletal: no clubbing / cyanosis. No joint deformity upper and lower extremities. Good ROM, no contractures. Normal muscle tone.  Skin: no rashes, ulcers. No induration-skin is very dry and has scattered areas of keratosis consistent with older age also multiple reddened areas/abrasions over articular surfaces of elbows and knees with another abrasion over the right lateral calf Neurologic: CN 2-12 grossly intact except for mild right facial drooping-no definitive dysarthria appreciated. Sensation intact, DTR normal. Strength 5/5 left side and right lower extremity-right upper extremity definitely weaker but this in setting of known chronic right acromioclavicular separation Psychiatric: Normal judgment and insight. Alert and oriented x 3. Normal mood.    Labs on Admission: I have personally reviewed following labs and imaging studies  CBC:  Recent Labs Lab 06/29/16 1132 06/29/16 1143  WBC 22.0*  --   NEUTROABS 16.5*  --   HGB 16.3 16.3  HCT 46.4 48.0  MCV 92.1  --   PLT 277  --    Basic Metabolic Panel:  Recent Labs Lab 06/29/16 1132 06/29/16 1143  NA 139 142  K 3.8 3.8  CL 102 105  CO2 22  --   GLUCOSE 152* 156*  BUN 20 25*  CREATININE 1.13 1.00  CALCIUM 9.1  --    GFR: Estimated Creatinine Clearance: 50.3 mL/min (by C-G formula based on SCr of 1 mg/dL). Liver Function Tests:  Recent Labs Lab 06/29/16 1132  AST 108*  ALT 30  ALKPHOS 60  BILITOT 1.5*  PROT 6.8  ALBUMIN 3.7   No results for input(s): LIPASE, AMYLASE in the last 168 hours. No results for input(s): AMMONIA in the last 168 hours. Coagulation Profile:  Recent Labs Lab 06/29/16 1132  INR 1.10   Cardiac Enzymes:  Recent Labs Lab 06/29/16 1132  CKTOTAL 4,388*   BNP (last 3  results) No results for input(s): PROBNP in the last 8760 hours. HbA1C: No results for input(s): HGBA1C in the last 72 hours. CBG: No results for input(s): GLUCAP in the last 168 hours. Lipid Profile: No results for input(s): CHOL, HDL, LDLCALC, TRIG, CHOLHDL, LDLDIRECT in the last 72 hours. Thyroid Function Tests: No results for input(s): TSH, T4TOTAL, FREET4, T3FREE, THYROIDAB in the last 72 hours. Anemia Panel: No results for input(s): VITAMINB12, FOLATE, FERRITIN, TIBC, IRON, RETICCTPCT  in the last 72 hours. Urine analysis:    Component Value Date/Time   COLORURINE YELLOW 08/04/2015 2142   APPEARANCEUR CLEAR 08/04/2015 2142   LABSPEC 1.013 08/04/2015 2142   PHURINE 6.5 08/04/2015 2142   GLUCOSEU NEGATIVE 08/04/2015 2142   HGBUR NEGATIVE 08/04/2015 2142   BILIRUBINUR NEGATIVE 08/04/2015 2142   KETONESUR NEGATIVE 08/04/2015 2142   PROTEINUR NEGATIVE 08/04/2015 2142   UROBILINOGEN 0.2 01/21/2014 1425   NITRITE NEGATIVE 08/04/2015 2142   LEUKOCYTESUR NEGATIVE 08/04/2015 2142   Sepsis Labs: _0 (procalcitonin:4,lacticidven:4) )No results found for this or any previous visit (from the past 240 hour(s)).   Radiological Exams on Admission: Dg Chest 2 View  Result Date: 06/29/2016 CLINICAL DATA:  Pain following fall EXAM: CHEST  2 VIEW COMPARISON:  August 04, 2015 FINDINGS: There is no edema or consolidation. Heart is upper normal in size with pulmonary vascularity within normal limits. No adenopathy. There is atherosclerotic calcification in the aorta. No pneumothorax. There is mild chronic acromioclavicular separation on the right. No fracture evident. IMPRESSION: Chronic acromioclavicular separation on the right. No pneumothorax. No edema or consolidation. Stable cardiac silhouette. There is aortic atherosclerosis. Electronically Signed   By: Lowella Grip III M.D.   On: 06/29/2016 13:11   Ct Head Wo Contrast  Result Date: 06/29/2016 CLINICAL DATA:  Right-sided weakness,  possible on witnessed fall, fatigue EXAM: CT HEAD WITHOUT CONTRAST TECHNIQUE: Contiguous axial images were obtained from the base of the skull through the vertex without intravenous contrast. COMPARISON:  CT brain scan of 08/04/2015 FINDINGS: Brain: There is no change in the degree of ventriculomegaly. Prominent cortical sulci also are noted diffusely consistent with diffuse atrophy. Moderate small vessel ischemic change is again noted throughout the periventricular white matter. No hemorrhage, mass lesion, or acute infarction is seen. Vascular: No vascular abnormality is seen on this unenhanced study. Skull: On bone window images, no calvarial abnormality is seen. Sinuses/Orbits: The paranasal sinuses clear. Other:  None. IMPRESSION: 1. Stable atrophy and moderate small vessel ischemic change. No acute intracranial abnormality. 2. No calvarial fracture is noted. Electronically Signed   By: Ivar Drape M.D.   On: 06/29/2016 12:04    EKG: (Independently reviewed) has been ordered but not completed  Assessment/Plan Principal Problem:   Rhabdomyolysis -Patient presents with frequent falls associated with reported right-sided weakness x 3 weeks, aphasia with normal CT of the head presentation -Appeared clinically dry on exam and subsequently found to have an elevated CK greater than 4000 with normal creatinine -Hold home metformin -Orthostatic vital signs -Normal saline bolus 1 L and IV fluid at 150/hr -Lactic acid -Appears hemoconcentrated with baseline hemoglobin 13.5 (today's hemoglobin 16.3); also has elevated AST and total bilirubin without abdominal pain which appears to be secondary to dehydration  Active Problems:   Leukocytosis -Has history of chronic leukocytosis been evaluated by hematology in the outpatient setting -Was last seen by Dr. Burr Medico in 2016 who felt the leukocytosis was reactive (had been present 6 years at that time) -WBC was within normal limits at 9700 in April 2017 -For  completeness of exam given patient presentation we'll rule out occult infectious process -Procalcitonin and ESR -Lactic acid -Blood cultures -Urinalysis and culture -Repeat CBC with differential in a.m. -Initial chest x-ray negative but if patient develops respiratory symptoms after hydration recommended repeat chest x-ray    Stroke-like symptoms/History of right-sided carotid endarterectomy 2015 -Has history of fatigue in setting of aortic sclerosis and was admitted in 2015 for stroke related to right carotid stenosis -Upon  my examination patient does have right-sided upper chest and weakness but this is in the setting of chronic shoulder injury (see below) -Discussed with neurology and agree that stroke is less likely but given known carotid disease will go ahead and institute ischemic stroke order set -Echocardiogram -Stat MR/MRA brain -Carotid duplex noting in 2015 patient with 40-59% disease on the left and has had prior right carotid endarterectomy in 2015-duplex 2017 with 40-59% disease on the left and patent right endarterectomy -PT/OT evaluation -Frequent neurological checks -Aspirin for antiplatelet -HgbA1c/FLP    Acromioclavicular (AC) joint injury, right, (chronic separation) -Ongoing and chronic -Patient with limited range of motion but denies pain    Hypertension -Systolic hypertension at presentation -Given report that patient was without home health follow-up in the past 24 hours due to weather conditions uncertain if he has received his usual home medications as prescribed -Continue Lopressor    Diabetes mellitus -Need to confirm with patient still taking Amaryl and metformin -Follow up on lactic acid given elevated CK presentation and appearance of dehydration -Follow up on hemoglobin A1c-was 6.6 on 07/24/2015 -Follow CBGs -SSI    BPH (benign prostatic hyperplasia) -History of urinary retention -Continue Flomax for now to avoid urinary retention    PAC  (premature atrial contraction) -Keep potassium greater than 4.0 -Check magnesium -Continue beta blocker    PVD (peripheral vascular disease)  -Continue Trental -Uncertain if has associated peripheral neuropathy and this is contracting to recent falling -Consider arterial duplex with ABIs this admission    Dementia without behavioral disturbance -Continue Namenda for now-if orthostatic may need to discontinue -Continue Zoloft    HLD (hyperlipidemia) -Continue Lipitor -Follow up on lipid panel      DVT prophylaxis: Lovenox Code Status: DO NOT RESUSCITATE Family Communication: No family or caregiver at bedside Disposition Plan: Undetermined-may need to discharge to skilled nursing facility Consults called: Neurology/[place]    Philipe Laswell L. ANP-BC [facility] Pager [phone]   If 7PM-7AM, please contact night-coverage [email or web address] Password [email or web address]  06/29/2016, 1:51 PM

## 2016-06-29 NOTE — Progress Notes (Addendum)
MR positive for acute ischemic CVA w/L MCA distribution and focus in L occiptal lobe- likely embolic etiology-since reports of dysarthria/??aphasia preadmit and stroke confirmed will order SLP eval-cont telemetry to monitor for PAF given frequent PACs while in ER  Erin Hearing, ANP

## 2016-06-29 NOTE — ED Notes (Signed)
EDP at bedside  

## 2016-06-29 NOTE — ED Notes (Signed)
Patient in xray 

## 2016-06-29 NOTE — Consult Note (Signed)
Requesting Physician: Dr. Greer Ee    Chief Complaint: possible stroke  History obtained from:  Patient     HPI:                                                                                                                                         RUTGER SALTON is an 83 y.o. male who presented to the ED after his aid noted his right side was weak. Concern for stroke.Patient denies any falls but states he has been weak on that arm for 3 days and awoke in the AM this way. He is a very poor historian. Of note he has had a stroke in the past affecting his right side along with a left CEA. HE also has had a AC separation with chronic right shoulder weakness.   Date last known well: Date: 06/27/2016 Time last known well: Unable to determine tPA Given: No: out of window   Past Medical History:  Diagnosis Date  . Carotid artery occlusion   . Diabetes mellitus without complication ([employer])   . Heart block   . Hypertension   . Insomnia   . Pancreatitis   . Prostate enlargement   . PVC's (premature ventricular contractions)   . PVD (peripheral vascular disease) ([place])   . Stroke [facility])     Past Surgical History:  Procedure Laterality Date  . CATARACT EXTRACTION W/ INTRAOCULAR LENS  IMPLANT, BILATERAL    . CHOLECYSTECTOMY  07/18/2011   Procedure: LAPAROSCOPIC CHOLECYSTECTOMY;  Surgeon: Gwenyth Ober, MD;  Location: [place];  Service: General;  Laterality: N/A;  . ENDARTERECTOMY Right 05/28/2013   Procedure: ENDARTERECTOMY CAROTID;  Surgeon: Rosetta Posner, MD;  Location: [place];  Service: Vascular;  Laterality: Right;  . FLEXIBLE SIGMOIDOSCOPY N/A 09/23/2014   Procedure: FLEXIBLE SIGMOIDOSCOPY;  Surgeon: Garlan Fair, MD;  Location[facility];  Service: Endoscopy;  Laterality: N/A;    Family History  Problem Relation Age of Onset  . Breast cancer Daughter   . Cancer Daughter 72    died of cancer   . Stroke Father   . Other      hypogonadism  . Heart attack Neg Hx   . Hypertension Neg Hx     Social History:  reports that he quit smoking about 54 years ago. He has a 60.00 pack-year smoking history. He has never used smokeless tobacco. He reports that he does not drink alcohol or use drugs.  Allergies:  Allergies  Allergen Reactions  . Penicillins Rash    Has patient had a PCN reaction causing immediate rash, facial/tongue/throat swelling, SOB or lightheadedness with hypotension: No Has patient had a PCN reaction causing severe rash involving mucus membranes or skin necrosis: No Has patient had a PCN reaction that required hospitalization No Has patient had a PCN reaction occurring within the last 10 years: No If all of the above answers are "  NO", then may proceed with Cephalosporin use.    Medications:                                                                                                                          Current Facility-Administered Medications  Medication Dose Route Frequency Provider Last Rate Last Dose  . 0.9 %  sodium chloride infusion   Intravenous Continuous Samella Parr, NP      . sodium chloride 0.9 % bolus 1,000 mL  1,000 mL Intravenous Once Samella Parr, NP       Current Outpatient Prescriptions  Medication Sig Dispense Refill  . atorvastatin (LIPITOR) 10 MG tablet Take 1 tablet (10 mg total) by mouth daily.    . ergocalciferol (VITAMIN D2) 50000 UNITS capsule Take 50,000 Units by mouth once a week. Friday    . fluticasone (FLONASE) 50 MCG/ACT nasal spray Place 1 spray into both nostrils daily as needed for allergies or rhinitis.    . Memantine HCl ER (NAMENDA XR) 28 MG CP24 Take 28 mg by mouth daily.     . metoprolol tartrate (LOPRESSOR) 25 MG tablet Take 0.5 tablets (12.5 mg total) by mouth 2 (two) times daily.    . Multiple Vitamin (MULTIVITAMIN WITH MINERALS) TABS Take 1 tablet by mouth daily.    Marland Kitchen omeprazole (PRILOSEC) 20 MG capsule Take 20 mg by mouth daily.    . pentoxifylline (TRENTAL) 400 MG CR tablet Take 400 mg by mouth 3  (three) times daily with meals.    . sertraline (ZOLOFT) 50 MG tablet Take 50 mg by mouth daily.    . Tamsulosin HCl (FLOMAX) 0.4 MG CAPS Take 0.4 mg by mouth daily.     Marland Kitchen zolpidem (AMBIEN) 5 MG tablet Take 5 mg by mouth at bedtime as needed for sleep.       ROS:                                                                                                                                       History obtained from the patient  General ROS: negative for - chills, fatigue, fever, night sweats, weight gain or weight loss Psychological ROS: negative for - behavioral disorder, hallucinations, memory difficulties, mood swings or suicidal ideation Ophthalmic ROS: negative for - blurry vision, double vision, eye pain or loss of vision ENT ROS: negative for - epistaxis, nasal  discharge, oral lesions, sore throat, tinnitus or vertigo Allergy and Immunology ROS: negative for - hives or itchy/watery eyes Hematological and Lymphatic ROS: negative for - bleeding problems, bruising or swollen lymph nodes Endocrine ROS: negative for - galactorrhea, hair pattern changes, polydipsia/polyuria or temperature intolerance Respiratory ROS: negative for - cough, hemoptysis, shortness of breath or wheezing Cardiovascular ROS: negative for - chest pain, dyspnea on exertion, edema or irregular heartbeat Gastrointestinal ROS: negative for - abdominal pain, diarrhea, hematemesis, nausea/vomiting or stool incontinence Genito-Urinary ROS: negative for - dysuria, hematuria, incontinence or urinary frequency/urgency Musculoskeletal ROS: negative for - joint swelling or muscular weakness Neurological ROS: as noted in HPI Dermatological ROS: negative for rash and skin lesion changes  Neurologic Examination:                                                                                                      Blood pressure 198/64, pulse (!) 45, temperature 98.1 F (36.7 C), height 5\' 8"  (1.727 m), weight 190 lb (86.2 kg),  SpO2 97 %.  HEENT-  Normocephalic, no lesions, without obvious abnormality.  Normal external eye and conjunctiva.  Normal TM's bilaterally.  Normal auditory canals and external ears. Normal external nose, mucus membranes and septum.  Normal pharynx. Cardiovascular- irregularly irregular rhythm, pulses palpable throughout   Lungs- chest clear, no wheezing, rales, normal symmetric air entry Abdomen- soft, non-tender; bowel sounds normal; no masses,  no organomegaly Extremities- no edema Lymph-no adenopathy palpable Musculoskeletal-no joint tenderness, deformity or swelling Skin-warm and dry, no hyperpigmentation, vitiligo, or suspicious lesions  Neurological Examination Mental Status: Alert, oriented, thought content appropriate.  Speech fluent without evidence of aphasia.  Able to follow 3 step commands without difficulty. Cranial Nerves: II: Discs flat bilaterally; Visual fields grossly normal,  III,IV, VI: ptosis not present, extra-ocular motions intact bilaterally, pupils equal, round, reactive to light and accommodation V,VII: smile asymmetric on the right, facial light touch sensation normal bilaterally VIII: hearing normal bilaterally IX,X: uvula rises symmetrically XI: bilateral shoulder shrug XII: midline tongue extension Motor: Right : Upper extremity   4/5    Left:     Upper extremity   5/5  Lower extremity   5/5     Lower extremity   5/5 Tone and bulk:normal tone throughout; no atrophy noted Sensory: Pinprick and light touch intact throughout, bilaterally Deep Tendon Reflexes: 1+ and symmetric throughout Plantars: Right: downgoing   Left: downgoing Cerebellar: normal finger-to-nose,  Gait: not tested       Lab Results: Basic Metabolic Panel:  Recent Labs Lab 06/29/16 1132 06/29/16 1143  NA 139 142  K 3.8 3.8  CL 102 105  CO2 22  --   GLUCOSE 152* 156*  BUN 20 25*  CREATININE 1.13 1.00  CALCIUM 9.1  --     Liver Function Tests:  Recent Labs Lab  06/29/16 1132  AST 108*  ALT 30  ALKPHOS 60  BILITOT 1.5*  PROT 6.8  ALBUMIN 3.7   No results for input(s): LIPASE, AMYLASE in the last 168 hours. No results for input(s): AMMONIA in the last 168  hours.  CBC:  Recent Labs Lab 06/29/16 1132 06/29/16 1143  WBC 22.0*  --   NEUTROABS 16.5*  --   HGB 16.3 16.3  HCT 46.4 48.0  MCV 92.1  --   PLT 277  --     Cardiac Enzymes:  Recent Labs Lab 06/29/16 1132  CKTOTAL 4,388*    Lipid Panel: No results for input(s): CHOL, TRIG, HDL, CHOLHDL, VLDL, LDLCALC in the last 168 hours.  CBG: No results for input(s): GLUCAP in the last 168 hours.  Microbiology: Results for orders placed or performed during the hospital encounter of 01/21/14  Urine culture     Status: None   Collection Time: 01/21/14  2:25 PM  Result Value Ref Range Status   Specimen Description URINE, CLEAN CATCH  Final   Special Requests NONE  Final   Culture  Setup Time   Final    01/21/2014 20:20 Performed at [place] Performed at [facility]  Final   Culture NO GROWTH Performed at [facility]  Final   Report Status 01/22/2014 FINAL  Final    Coagulation Studies:  Recent Labs  06/29/16 1132  LABPROT 14.3  INR 1.10    Imaging: Dg Chest 2 View  Result Date: 06/29/2016 CLINICAL DATA:  Pain following fall EXAM: CHEST  2 VIEW COMPARISON:  August 04, 2015 FINDINGS: There is no edema or consolidation. Heart is upper normal in size with pulmonary vascularity within normal limits. No adenopathy. There is atherosclerotic calcification in the aorta. No pneumothorax. There is mild chronic acromioclavicular separation on the right. No fracture evident. IMPRESSION: Chronic acromioclavicular separation on the right. No pneumothorax. No edema or consolidation. Stable cardiac silhouette. There is aortic atherosclerosis. Electronically Signed   By: Lowella Grip III M.D.   On: 06/29/2016 13:11   Ct Head Wo  Contrast  Result Date: 06/29/2016 CLINICAL DATA:  Right-sided weakness, possible on witnessed fall, fatigue EXAM: CT HEAD WITHOUT CONTRAST TECHNIQUE: Contiguous axial images were obtained from the base of the skull through the vertex without intravenous contrast. COMPARISON:  CT brain scan of 08/04/2015 FINDINGS: Brain: There is no change in the degree of ventriculomegaly. Prominent cortical sulci also are noted diffusely consistent with diffuse atrophy. Moderate small vessel ischemic change is again noted throughout the periventricular white matter. No hemorrhage, mass lesion, or acute infarction is seen. Vascular: No vascular abnormality is seen on this unenhanced study. Skull: On bone window images, no calvarial abnormality is seen. Sinuses/Orbits: The paranasal sinuses clear. Other:  None. IMPRESSION: 1. Stable atrophy and moderate small vessel ischemic change. No acute intracranial abnormality. 2. No calvarial fracture is noted. Electronically Signed   By: Ivar Drape M.D.   On: 06/29/2016 12:04       Assessment and plan discussed with with attending physician and they are in agreement.    Xavian Hardcastle PA-C [facility] [phone]  06/29/2016, 1:42 PM   Assessment: 83 y.o. male with multiple ischemic infarcts in the left ICA distribution. He has tandem stenosis which is likely responsible, either as embolic source or less likely from a hypoperfusion event. There has been some concern for bradycardia. If he did desire to have surgery at his age, serious consideration prior to pursuing this would need to be made, but discussion with*surgery could be had.  Stroke Risk Factors - carotid stenosis, diabetes mellitus and hypertension  Recommend: 1) I would add aspirin to Plavix. 2) LDL, A1c 3) echo 4) PT,  OT, ST 5) stroke team to follow  Roland Rack, MD [facility] [phone]  If 7pm- 7am, please page neurology on call as listed in [place].

## 2016-06-29 NOTE — ED Notes (Signed)
Attempted report x1. 

## 2016-06-29 NOTE — ED Provider Notes (Signed)
[place] DEPT Provider Note   CSN: [id number] Arrival date & time: 06/29/16  1100     History   Chief Complaint Chief Complaint  Patient presents with  . Fall  . Aphasia    HPI Zachary [name] is a 83 y.o. male.  83 yo M with a chief complaint of right-sided weakness and slurred speech. This been going on for at least a couple days. Patient has a history of dementia and has difficulty having to the history. Level V caveat dementia. Per him he has been having some right-sided weakness for at least 3 weeks. Per EMS and his home health nurse he was last seen normal couple days ago now with slurred speech and right-sided weakness. EMS also felt that he had fallen and was unable to get up overnight. The patient denies that fall ever occurred.   The history is provided by the patient.  Fall  Pertinent negatives include no chest pain, no abdominal pain, no headaches and no shortness of breath.  Cerebrovascular Accident  This is a new problem. The current episode started more than 2 days ago. The problem occurs constantly. The problem has not changed since onset.Pertinent negatives include no chest pain, no abdominal pain, no headaches and no shortness of breath. Nothing aggravates the symptoms. He has tried nothing for the symptoms. The treatment provided no relief.    Past Medical History:  Diagnosis Date  . Carotid artery occlusion   . Diabetes mellitus without complication ([place])   . Heart block   . Hypertension   . Insomnia   . Pancreatitis   . Prostate enlargement   . PVC's (premature ventricular contractions)   . PVD (peripheral vascular disease) ([place])   . Stroke [facility])     Patient Active Problem List   Diagnosis Date Noted  . Rhabdomyolysis 06/29/2016  . Fall at home 06/29/2016  . Acromioclavicular [facility]) joint injury, right, (chronic separation) 06/29/2016  . BPH (benign prostatic hyperplasia) 06/29/2016  . HLD (hyperlipidemia) 06/29/2016  . Aortic sclerosis  09/18/2015  . Fatigue 09/18/2015  . Pre-syncope 08/05/2015  . Leukocytosis 08/04/2015  . Syncope 08/04/2015  . Hypogonadism male 08/28/2014  . Diarrhea 08/28/2014  . Screening for prostate cancer 07/30/2014  . Dementia without behavioral disturbance 07/04/2013  . Aftercare following surgery of the circulatory system, [place] 06/19/2013  . CVA (cerebral vascular accident) ([place]) 06/04/2013  . Urinary retention 05/29/2013  . Carotid artery disease ([place]) 05/28/2013  . History of right-sided carotid endarterectomy 2015 05/28/2013  . Pulsus bigeminis 05/20/2013  . Diabetes mellitus ([place]) 05/20/2013  . Weakness 05/20/2013  . Acute ischemic stroke ([place]) 05/20/2013  . Postop check 08/06/2011  . Acute cholecystitis 07/19/2011  . Hypertension 07/15/2011  . PVD (peripheral vascular disease) ([place])     Past Surgical History:  Procedure Laterality Date  . CATARACT EXTRACTION W/ INTRAOCULAR LENS  IMPLANT, BILATERAL    . CHOLECYSTECTOMY  07/18/2011   Procedure: LAPAROSCOPIC CHOLECYSTECTOMY;  Surgeon: Gwenyth Ober, MD;  Location: [place];  Service: General;  Laterality: N/A;  . ENDARTERECTOMY Right 05/28/2013   Procedure: ENDARTERECTOMY CAROTID;  Surgeon: Rosetta Posner, MD;  Location: [place];  Service: Vascular;  Laterality: Right;  . FLEXIBLE SIGMOIDOSCOPY N/A 09/23/2014   Procedure: FLEXIBLE SIGMOIDOSCOPY;  Surgeon: Garlan Fair, MD;  [facility];  Service: Endoscopy;  Laterality: N/A;       Home Medications    Prior to Admission medications   Medication Sig Start Date End Date Taking? Authorizing Provider  atorvastatin (LIPITOR) 10 MG tablet Take 1 tablet (10 mg total) by mouth daily. 05/30/13   Marius Ditch, MD  ergocalciferol (VITAMIN D2) 50000 UNITS capsule Take 50,000 Units by mouth once a week. Friday    Historical Provider, MD  fluticasone (FLONASE) 50 MCG/ACT nasal spray Place 1 spray into both nostrils daily as needed for allergies or rhinitis.    Historical Provider, MD    Memantine HCl ER (NAMENDA XR) 28 MG CP24 Take 28 mg by mouth daily.     Historical Provider, MD  metoprolol tartrate (LOPRESSOR) 25 MG tablet Take 0.5 tablets (12.5 mg total) by mouth 2 (two) times daily. 05/25/13   Marius Ditch, MD  Multiple Vitamin (MULTIVITAMIN WITH MINERALS) TABS Take 1 tablet by mouth daily.    Historical Provider, MD  omeprazole (PRILOSEC) 20 MG capsule Take 20 mg by mouth daily.    Historical Provider, MD  pentoxifylline (TRENTAL) 400 MG CR tablet Take 400 mg by mouth 3 (three) times daily with meals.    Historical Provider, MD  sertraline (ZOLOFT) 50 MG tablet Take 50 mg by mouth daily.    Historical Provider, MD  Tamsulosin HCl (FLOMAX) 0.4 MG CAPS Take 0.4 mg by mouth daily.     Historical Provider, MD  zolpidem (AMBIEN) 5 MG tablet Take 5 mg by mouth at bedtime as needed for sleep.    Historical Provider, MD    Family History Family History  Problem Relation Age of Onset  . Breast cancer Daughter   . Cancer Daughter 20    died of cancer   . Stroke Father   . Other      hypogonadism  . Heart attack Neg Hx   . Hypertension Neg Hx     Social History Social History  Substance Use Topics  . Smoking status: Former Smoker    Packs/day: 3.00    Years: 20.00    Quit date: 04/19/1962  . Smokeless tobacco: Never Used  . Alcohol use No     Allergies   Penicillins   Review of Systems Review of Systems  Constitutional: Negative for chills and fever.  HENT: Negative for congestion and facial swelling.   Eyes: Negative for discharge and visual disturbance.  Respiratory: Negative for shortness of breath.   Cardiovascular: Negative for chest pain and palpitations.  Gastrointestinal: Negative for abdominal pain, diarrhea and vomiting.  Musculoskeletal: Negative for arthralgias and myalgias.  Skin: Negative for color change and rash.  Neurological: Positive for speech difficulty and weakness. Negative for tremors, syncope and headaches.   Psychiatric/Behavioral: Negative for confusion and dysphoric mood.     Physical Exam Updated Vital Signs BP 198/64   Pulse (!) 45   Temp 98.1 F (36.7 C)   Ht 5\' 8"  (1.727 m)   Wt 190 lb (86.2 kg)   SpO2 97%   BMI 28.89 kg/m   Physical Exam  Constitutional: He is oriented to person, place, and time. He appears well-developed and well-nourished.  HENT:  Head: Normocephalic and atraumatic.  Eyes: EOM are normal.  Right eye is surgically non-reactive  Neck: Normal range of motion. Neck supple. No JVD present.  Cardiovascular: Normal rate and regular rhythm.  Exam reveals no gallop and no friction rub.   No murmur heard. Pulmonary/Chest: No respiratory distress. He has no wheezes.  Abdominal: He exhibits no distension and no mass. There is no tenderness. There is no rebound and no guarding.  Musculoskeletal: Normal range of motion.  Neurological: He is alert  and oriented to person, place, and time. He displays no Babinski's sign on the right side. He displays no Babinski's sign on the left side.  Slurred speech  Right upper and right lower extremity 4 out of 5 compared to left 5 out of 5 strength.   Skin: No rash noted. No pallor.  Psychiatric: He has a normal mood and affect. His behavior is normal.  Nursing note and vitals reviewed.    ED Treatments / Results  Labs (all labs ordered are listed, but only abnormal results are displayed) Labs Reviewed  CBC - Abnormal; Notable for the following:       Result Value   WBC 22.0 (*)    All other components within normal limits  DIFFERENTIAL - Abnormal; Notable for the following:    Neutro Abs 16.5 (*)    Monocytes Absolute 1.9 (*)    All other components within normal limits  COMPREHENSIVE METABOLIC PANEL - Abnormal; Notable for the following:    Glucose, Bld 152 (*)    AST 108 (*)    Total Bilirubin 1.5 (*)    GFR calc non Af Amer 54 (*)    All other components within normal limits  CK - Abnormal; Notable for the  following:    Total CK 4,388 (*)    All other components within normal limits  I-STAT CHEM 8, ED - Abnormal; Notable for the following:    BUN 25 (*)    Glucose, Bld 156 (*)    Calcium, Ion 1.09 (*)    All other components within normal limits  CULTURE, BLOOD (ROUTINE X 2)  CULTURE, BLOOD (ROUTINE X 2)  ETHANOL  PROTIME-INR  APTT  RAPID URINE DRUG SCREEN, HOSP PERFORMED  URINALYSIS, ROUTINE W REFLEX MICROSCOPIC  PROCALCITONIN  LACTIC ACID, PLASMA  SEDIMENTATION RATE  I-STAT TROPOININ, ED    EKG  EKG Interpretation None       Radiology Dg Chest 2 View  Result Date: 06/29/2016 CLINICAL DATA:  Pain following fall EXAM: CHEST  2 VIEW COMPARISON:  August 04, 2015 FINDINGS: There is no edema or consolidation. Heart is upper normal in size with pulmonary vascularity within normal limits. No adenopathy. There is atherosclerotic calcification in the aorta. No pneumothorax. There is mild chronic acromioclavicular separation on the right. No fracture evident. IMPRESSION: Chronic acromioclavicular separation on the right. No pneumothorax. No edema or consolidation. Stable cardiac silhouette. There is aortic atherosclerosis. Electronically Signed   By: Lowella Grip III M.D.   On: 06/29/2016 13:11   Ct Head Wo Contrast  Result Date: 06/29/2016 CLINICAL DATA:  Right-sided weakness, possible on witnessed fall, fatigue EXAM: CT HEAD WITHOUT CONTRAST TECHNIQUE: Contiguous axial images were obtained from the base of the skull through the vertex without intravenous contrast. COMPARISON:  CT brain scan of 08/04/2015 FINDINGS: Brain: There is no change in the degree of ventriculomegaly. Prominent cortical sulci also are noted diffusely consistent with diffuse atrophy. Moderate small vessel ischemic change is again noted throughout the periventricular white matter. No hemorrhage, mass lesion, or acute infarction is seen. Vascular: No vascular abnormality is seen on this unenhanced study. Skull: On  bone window images, no calvarial abnormality is seen. Sinuses/Orbits: The paranasal sinuses clear. Other:  None. IMPRESSION: 1. Stable atrophy and moderate small vessel ischemic change. No acute intracranial abnormality. 2. No calvarial fracture is noted. Electronically Signed   By: Ivar Drape M.D.   On: 06/29/2016 12:04    Procedures Procedures (including critical care time)  Medications Ordered  in ED Medications  sodium chloride 0.9 % bolus 1,000 mL (not administered)  0.9 %  sodium chloride infusion (not administered)     Initial Impression / Assessment and Plan / ED Course  I have reviewed the triage vital signs and the nursing notes.  Pertinent labs & imaging results that were available during my care of the patient were reviewed by me and considered in my medical decision making (see chart for details).     83 yo M With right-sided weakness. There is no firm timeline but does not appear that this occurred acutely this morning. Patient states the symptoms were going on for quite some time. Last neurology note was about a year ago with equal muscle strength bilaterally. Will obtain a CT the head discussed with neurology admit.  The patients results and plan were reviewed and discussed.   Any x-rays performed were independently reviewed by myself.   Differential diagnosis were considered with the presenting HPI.  Medications  sodium chloride 0.9 % bolus 1,000 mL (not administered)  0.9 %  sodium chloride infusion (not administered)    Vitals:   06/29/16 1105 06/29/16 1107 06/29/16 1130 06/29/16 1223  BP: (!) 183/50  198/64   Pulse: (!) 43  (!) 45   Temp:    98.1 F (36.7 C)  SpO2: 96%  97%   Weight:  190 lb (86.2 kg)    Height:  5\' 8"  (1.727 m)      Final diagnoses:  Slurred speech  Right sided weakness    Admission/ observation were discussed with the admitting physician, patient and/or family and they are comfortable with the plan.    Final Clinical  Impressions(s) / ED Diagnoses   Final diagnoses:  Slurred speech  Right sided weakness    New Prescriptions New Prescriptions   No medications on file     Deno Etienne, DO 06/29/16 1336

## 2016-06-29 NOTE — ED Triage Notes (Signed)
Patient coming from home with caregiver. HH unable to come d/t weather. Patient had unwitnessed fall. Patient states he just sat down on the floor because he was tired. Denies dizziness, LOC, or mechanical fall. Patient a/ox4. Patient does have slurred speech and weakness noted to the right arm. LSW time was Sunday. Denies blood thinners. Hx of CVA.

## 2016-06-29 NOTE — Progress Notes (Signed)
Patients HR 37 on monitor, 38 manual.   PTs HR 37. B/P 162/75 He has lopressor ordered at 2200.  Thank you, Santiago Glad Awaiting call back.

## 2016-06-29 NOTE — Progress Notes (Signed)
Patients hr is in the 80s per tele. Neurologist states irregular in the 70 and 80s. Will page on call regarding lopressor.

## 2016-06-29 NOTE — ED Notes (Signed)
Patient at MRI 

## 2016-06-30 ENCOUNTER — Inpatient Hospital Stay (HOSPITAL_COMMUNITY): Payer: Medicare Other

## 2016-06-30 DIAGNOSIS — I6522 Occlusion and stenosis of left carotid artery: Secondary | ICD-10-CM

## 2016-06-30 DIAGNOSIS — I10 Essential (primary) hypertension: Secondary | ICD-10-CM

## 2016-06-30 DIAGNOSIS — I491 Atrial premature depolarization: Secondary | ICD-10-CM

## 2016-06-30 DIAGNOSIS — I6789 Other cerebrovascular disease: Secondary | ICD-10-CM

## 2016-06-30 DIAGNOSIS — T796XXD Traumatic ischemia of muscle, subsequent encounter: Secondary | ICD-10-CM

## 2016-06-30 DIAGNOSIS — I63412 Cerebral infarction due to embolism of left middle cerebral artery: Secondary | ICD-10-CM

## 2016-06-30 DIAGNOSIS — F0150 Vascular dementia without behavioral disturbance: Secondary | ICD-10-CM

## 2016-06-30 DIAGNOSIS — E7800 Pure hypercholesterolemia, unspecified: Secondary | ICD-10-CM

## 2016-06-30 LAB — COMPREHENSIVE METABOLIC PANEL
ALK PHOS: 50 U/L (ref 38–126)
ALT: 31 U/L (ref 17–63)
AST: 94 U/L — ABNORMAL HIGH (ref 15–41)
Albumin: 3 g/dL — ABNORMAL LOW (ref 3.5–5.0)
Anion gap: 13 (ref 5–15)
BUN: 21 mg/dL — ABNORMAL HIGH (ref 6–20)
CALCIUM: 8.4 mg/dL — AB (ref 8.9–10.3)
CO2: 21 mmol/L — ABNORMAL LOW (ref 22–32)
CREATININE: 1.07 mg/dL (ref 0.61–1.24)
Chloride: 106 mmol/L (ref 101–111)
GFR, EST NON AFRICAN AMERICAN: 58 mL/min — AB (ref >60)
Glucose, Bld: 122 mg/dL — ABNORMAL HIGH (ref 65–99)
Potassium: 3.3 mmol/L — ABNORMAL LOW (ref 3.5–5.1)
Sodium: 140 mmol/L (ref 135–145)
Total Bilirubin: 1.4 mg/dL — ABNORMAL HIGH (ref 0.3–1.2)
Total Protein: 5.8 g/dL — ABNORMAL LOW (ref 6.5–8.1)

## 2016-06-30 LAB — BLOOD CULTURE ID PANEL (REFLEXED)
ACINETOBACTER BAUMANNII: NOT DETECTED
CANDIDA PARAPSILOSIS: NOT DETECTED
CANDIDA TROPICALIS: NOT DETECTED
Candida albicans: NOT DETECTED
Candida glabrata: NOT DETECTED
Candida krusei: NOT DETECTED
ENTEROBACTERIACEAE SPECIES: NOT DETECTED
Enterobacter cloacae complex: NOT DETECTED
Enterococcus species: NOT DETECTED
Escherichia coli: NOT DETECTED
HAEMOPHILUS INFLUENZAE: NOT DETECTED
KLEBSIELLA OXYTOCA: NOT DETECTED
Klebsiella pneumoniae: NOT DETECTED
Listeria monocytogenes: NOT DETECTED
NEISSERIA MENINGITIDIS: NOT DETECTED
PROTEUS SPECIES: NOT DETECTED
Pseudomonas aeruginosa: NOT DETECTED
SERRATIA MARCESCENS: NOT DETECTED
STAPHYLOCOCCUS AUREUS BCID: NOT DETECTED
STAPHYLOCOCCUS SPECIES: NOT DETECTED
STREPTOCOCCUS AGALACTIAE: NOT DETECTED
STREPTOCOCCUS SPECIES: NOT DETECTED
Streptococcus pneumoniae: NOT DETECTED
Streptococcus pyogenes: NOT DETECTED

## 2016-06-30 LAB — GLUCOSE, CAPILLARY
Glucose-Capillary: 112 mg/dL — ABNORMAL HIGH (ref 65–99)
Glucose-Capillary: 130 mg/dL — ABNORMAL HIGH (ref 65–99)
Glucose-Capillary: 153 mg/dL — ABNORMAL HIGH (ref 65–99)

## 2016-06-30 LAB — CBC WITH DIFFERENTIAL/PLATELET
Basophils Absolute: 0 10*3/uL (ref 0.0–0.1)
Basophils Relative: 0 %
Eosinophils Absolute: 0.2 10*3/uL (ref 0.0–0.7)
Eosinophils Relative: 1 %
HEMATOCRIT: 40.5 % (ref 39.0–52.0)
HEMOGLOBIN: 14 g/dL (ref 13.0–17.0)
LYMPHS PCT: 25 %
Lymphs Abs: 3.7 10*3/uL (ref 0.7–4.0)
MCH: 31.6 pg (ref 26.0–34.0)
MCHC: 34.6 g/dL (ref 30.0–36.0)
MCV: 91.4 fL (ref 78.0–100.0)
Monocytes Absolute: 1.5 10*3/uL — ABNORMAL HIGH (ref 0.1–1.0)
Monocytes Relative: 10 %
NEUTROS PCT: 64 %
Neutro Abs: 9.5 10*3/uL — ABNORMAL HIGH (ref 1.7–7.7)
Platelets: 219 10*3/uL (ref 150–400)
RBC: 4.43 MIL/uL (ref 4.22–5.81)
RDW: 13.2 % (ref 11.5–15.5)
WBC: 14.9 10*3/uL — AB (ref 4.0–10.5)

## 2016-06-30 LAB — LIPID PANEL
CHOL/HDL RATIO: 5.9 ratio
Cholesterol: 147 mg/dL (ref 0–200)
HDL: 25 mg/dL — ABNORMAL LOW (ref >40)
LDL CALC: 99 mg/dL (ref 0–99)
Triglycerides: 113 mg/dL (ref <150)
VLDL: 23 mg/dL (ref 0–40)

## 2016-06-30 LAB — CK: CK TOTAL: 2444 U/L — AB (ref 49–397)

## 2016-06-30 LAB — ECHOCARDIOGRAM COMPLETE
Height: 71 in
Weight: 3104 oz

## 2016-06-30 LAB — LACTIC ACID, PLASMA: LACTIC ACID, VENOUS: 2 mmol/L — AB (ref 0.5–1.9)

## 2016-06-30 MED ORDER — POTASSIUM CHLORIDE 20 MEQ/15ML (10%) PO SOLN
20.0000 meq | Freq: Every day | ORAL | Status: DC
  Administered 2016-06-30 – 2016-07-02 (×3): 20 meq via ORAL
  Filled 2016-06-30 (×3): qty 15

## 2016-06-30 MED ORDER — HALOPERIDOL LACTATE 5 MG/ML IJ SOLN
0.5000 mg | Freq: Once | INTRAMUSCULAR | Status: AC
  Administered 2016-06-30: 0.5 mg via INTRAVENOUS
  Filled 2016-06-30: qty 1

## 2016-06-30 MED ORDER — POTASSIUM CHLORIDE IN NACL 20-0.9 MEQ/L-% IV SOLN
INTRAVENOUS | Status: DC
  Administered 2016-06-30 – 2016-07-02 (×5): via INTRAVENOUS
  Filled 2016-06-30 (×6): qty 1000

## 2016-06-30 MED ORDER — VANCOMYCIN HCL IN DEXTROSE 1-5 GM/200ML-% IV SOLN
1000.0000 mg | Freq: Once | INTRAVENOUS | Status: AC
  Administered 2016-06-30: 1000 mg via INTRAVENOUS
  Filled 2016-06-30: qty 200

## 2016-06-30 NOTE — Progress Notes (Signed)
Pharmacy Antibiotic Note  Zachary [name] is a 83 y.o. male admitted on 06/29/2016 with blood culture growing GPC in cocci in 1/2 bottles.  Pharmacy has been consulted for vancomycin dosing.  Plan: 1. Spoke to Dr. Carolin Sicks, will give 1 dose of vancomycin for now, and f/u culture growth tomorrow. 2. Vancomycin 1g x 1 now.  Based on his CrCl will need repeat dosing ~ 8 AM if continued treatment is desired,  Height: 5\' 11"  (180.3 cm) Weight: 194 lb (88 kg) IBW/kg (Calculated) : 75.3  Temp (24hrs), Avg:98.1 F (36.7 C), Min:97.9 F (36.6 C), Max:98.4 F (36.9 C)   Recent Labs Lab 06/29/16 1132 06/29/16 1143 06/29/16 1533 06/30/16 0608  WBC 22.0*  --   --  14.9*  CREATININE 1.13 1.00  --  1.07  LATICACIDVEN  --   --  2.2* 2.0*    Estimated Creatinine Clearance: 46.9 mL/min (by C-G formula based on SCr of 1.07 mg/dL).    Allergies  Allergen Reactions  . Penicillins Rash    Has patient had a PCN reaction causing immediate rash, facial/tongue/throat swelling, SOB or lightheadedness with hypotension: No Has patient had a PCN reaction causing severe rash involving mucus membranes or skin necrosis: No Has patient had a PCN reaction that required hospitalization No Has patient had a PCN reaction occurring within the last 10 years: No If all of the above answers are "NO", then may proceed with Cephalosporin use.    Antimicrobials this admission: Vanc 3/14 >>   Dose adjustments this admission:   Microbiology results: 3/14 BCx: 1/2 GCP in clusters *nothing detected on BCID  UCx: *  * Sputum: * * MRSA PCR: *  Thank you for allowing pharmacy to be a part of this patient's care.  Uvaldo Rising, BCPS  Clinical Pharmacist Pager [phone]  06/30/2016 5:55 PM

## 2016-06-30 NOTE — Progress Notes (Signed)
[facility] tele reports to me that the HR is WNL. Our dynamap isn't picking up some of the beats. They will send strips. This was sent to NP [facility].

## 2016-06-30 NOTE — Care Management Note (Signed)
Case Management Note  Patient Details  Name: Zachary [name] MRN: [id number] Date of Birth: 1922-02-02  Subjective/Objective:  Admitted with Rhabdomyolysis,s/p fall and noted slurred speech with right arm weakness. Hx of CVA left side in 2015/right carotid stenosis s/p R ECA, Dementia, BPH, hypertension, frequent falls, PVD, PVCs. From home with wife. Pt with caretaker M-F, 8a-4p.  Manuela Schwartz (Relative) HCPOA Tommie Ard  (caretaker)  [name] (daughter)   [phone]     Action/Plan:  Per PT"s recommendation: SNF....Marland Kitchenpalliative consult pending... CM to f/u with disposition needs  Expected Discharge Date:                  Expected Discharge Plan:  [place]  In-House Referral:  Clinical Social Work  Discharge planning Services  CM Consult  Post Acute Care Choice:    Choice offered to:     DME Arranged:    DME Agency:     HH Arranged:    [place] Agency:     Status of Service:  In process, will continue to follow  If discussed at Long Length of Stay Meetings, dates discussed:    Additional Comments:  Sharin Mons, RN 06/30/2016, 5:32 PM

## 2016-06-30 NOTE — Progress Notes (Signed)
During neuro assessment RN noticed left pupil pinpoint reactive right pupil 3 sluggish.on prior assessment and shift report pupils have been equal.  family reports only eye surgery is cataracts. Dr Carolin Sicks made aware

## 2016-06-30 NOTE — Progress Notes (Signed)
PROGRESS NOTE    Zachary [name]  DPO:[id number] DOB: 02-Aug-1921 DOA: 06/29/2016 PCP: [name], MD   Brief Narrative: 83 y.o. male with medical history significant for CVA affecting left side in 2015 in the setting of right carotid stenosis, status post right carotid endarterectomy in 2015, dementia, BPH with history of urinary retention, hypertension, ? Diabetes although last hemoglobin A1c 2015 was 6.6, chronic leukocytosis evaluated by hematology in the outpatient setting, peripheral vascular disease on medical therapy, dyslipidemia.Patient had an unwitnessed fall, slurred speech and weakness on right arm.  Patient was found to have rhabdomyolysis with CK level of 4388 on admission. MRI of the brain consistent with multiple small foci of acute ischemia within the left hemisphere predominantly within the left MCA distribution. Neurology is following.  Assessment & Plan:  # Acute ischemic stroke at left MCA distribution: - Stroke neurology is following. Further evaluation of MRSA and CT angina finding as per neurologist.  -Follow-up echocardiogram -Patient has LDL of 99, A1c of 6.6 -Patient is on aspirin and Plavix, Lipitor -PT, OT and social worker evaluation possible rehabilitation discharge -Continue supportive care  #rhabdomyolysis due to fall ( traumatic); -CK level trending down with IV fluid. IV fluids changed with potassium chloride because of mild hypokalemia. Serum creatinine level stable. Continue to monitor labs.  # Hypertension: Continue metoprolol. Monitor blood pressure.  #Type 2 diabetes: A1c 6.6. Continue insulin sliding scale. Monitor blood sugar level.  #Chronic leukocytosis: WBC count trending down to 14.9 today. No sign of infection. Reportedly patient follows up with hepatologist outpatient. Cultures negative.  #Bradycardia: The patient's heart rate went down to 30s and 40s last night. Metoprolol held. Heart rate better now. Cardiology consulted.  Patient has history of premature atrial contraction.  Continue other medications and supportive care. Because of patient's age. I consulted palliative care.  Principal Problem:   Rhabdomyolysis Active Problems:   Hypertension   PVD (peripheral vascular disease) ([place])   Diabetes mellitus ([place])   History of right-sided carotid endarterectomy 2015   Dementia without behavioral disturbance   Leukocytosis   Stroke-like symptoms   Acromioclavicular (AC) joint injury, right, (chronic separation)   BPH (benign prostatic hyperplasia)   HLD (hyperlipidemia)   PAC (premature atrial contraction)   Cerebral embolism with cerebral infarction  DVT prophylaxis: Lovenox subcutaneous Code Status: DO NOT RESUSCITATE Family Communication: I discussed with the patient's daughter and wife at bedside Disposition Plan: Likely discharge to SNF in 1-2 days. Discussed with the social worker    Consultants:   Neurologist  Cardiologist  Palliative care  Procedures: None Antimicrobials: None Subjective: Patient was seen and examined at bedside. He was alert awake and oriented to hospital and name. Patient's daughter and wife at bedside. Denied pain but reports weakness. Review of system is limited because of his underlying dementia.  Objective: Vitals:   06/30/16 0400 06/30/16 0600 06/30/16 0800 06/30/16 0854  BP: (!) 143/58 (!) 131/55 (!) 152/50 (!) 152/50  Pulse: 68 79    Resp:      Temp:      TempSrc:      SpO2:      Weight:      Height:        Intake/Output Summary (Last 24 hours) at 06/30/16 1141 Last data filed at 06/30/16 0951  Gross per 24 hour  Intake            142.5 ml  Output  0 ml  Net            142.5 ml   Filed Weights   06/29/16 1107 06/29/16 1755  Weight: 86.2 kg (190 lb) 88 kg (194 lb)    Examination:  General exam: Elderly male lying in bed comfortable Respiratory system: Clear to auscultation. Respiratory effort normal. No wheezing or  crackle Cardiovascular system: S1 & S2 heard, RRR.  No pedal edema. Gastrointestinal system: Abdomen is nondistended, soft and nontender. Normal bowel sounds heard. Central nervous system: Alert awake and following commands. Oriented to hospital only Extremities: Right-sided weakness Skin: No rashes, lesions or ulcers Psychiatry: Judgement and insight appear impaired     Data Reviewed: I have personally reviewed following labs and imaging studies  CBC:  Recent Labs Lab 06/29/16 1132 06/29/16 1143 06/30/16 0608  WBC 22.0*  --  14.9*  NEUTROABS 16.5*  --  9.5*  HGB 16.3 16.3 14.0  HCT 46.4 48.0 40.5  MCV 92.1  --  91.4  PLT 277  --  952   Basic Metabolic Panel:  Recent Labs Lab 06/29/16 1132 06/29/16 1143 06/29/16 1413 06/30/16 0608  NA 139 142  --  140  K 3.8 3.8  --  3.3*  CL 102 105  --  106  CO2 22  --   --  21*  GLUCOSE 152* 156*  --  122*  BUN 20 25*  --  21*  CREATININE 1.13 1.00  --  1.07  CALCIUM 9.1  --   --  8.4*  MG  --   --  1.8  --    GFR: Estimated Creatinine Clearance: 46.9 mL/min (by C-G formula based on SCr of 1.07 mg/dL). Liver Function Tests:  Recent Labs Lab 06/29/16 1132 06/30/16 0608  AST 108* 94*  ALT 30 31  ALKPHOS 60 50  BILITOT 1.5* 1.4*  PROT 6.8 5.8*  ALBUMIN 3.7 3.0*   No results for input(s): LIPASE, AMYLASE in the last 168 hours. No results for input(s): AMMONIA in the last 168 hours. Coagulation Profile:  Recent Labs Lab 06/29/16 1132  INR 1.10   Cardiac Enzymes:  Recent Labs Lab 06/29/16 1132 06/30/16 0608  CKTOTAL 4,388* 2,444*   BNP (last 3 results) No results for input(s): PROBNP in the last 8760 hours. HbA1C: No results for input(s): HGBA1C in the last 72 hours. CBG:  Recent Labs Lab 06/29/16 1751 06/29/16 2212 06/30/16 0757  GLUCAP 120* 120* 112*   Lipid Profile:  Recent Labs  06/30/16 0608  CHOL 147  HDL 25*  LDLCALC 99  TRIG 113  CHOLHDL 5.9   Thyroid Function Tests: No results  for input(s): TSH, T4TOTAL, FREET4, T3FREE, THYROIDAB in the last 72 hours. Anemia Panel: No results for input(s): VITAMINB12, FOLATE, FERRITIN, TIBC, IRON, RETICCTPCT in the last 72 hours. Sepsis Labs:  Recent Labs Lab 06/29/16 1413 06/29/16 1533 06/30/16 8413  PROCALCITON <0.10  --   --   LATICACIDVEN  --  2.2* 2.0*    Recent Results (from the past 240 hour(s))  Culture, blood (Routine X 2) w Reflex to ID Panel     Status: None (Preliminary result)   Collection Time: 06/29/16  2:20 PM  Result Value Ref Range Status   Specimen Description BLOOD RIGHT HAND  Final   Special Requests BOTTLES DRAWN AEROBIC ONLY  5CC  Final   Culture NO GROWTH < 24 HOURS  Final   Report Status PENDING  Incomplete  Culture, blood (Routine X 2) w Reflex to  ID Panel     Status: None (Preliminary result)   Collection Time: 06/29/16  2:24 PM  Result Value Ref Range Status   Specimen Description BLOOD LEFT ANTECUBITAL  Final   Special Requests IN PEDIATRIC BOTTLE  4CC  Final   Culture NO GROWTH < 24 HOURS  Final   Report Status PENDING  Incomplete         Radiology Studies: Ct Angio Head W Or Wo Contrast  Result Date: 06/29/2016 CLINICAL DATA:  Acute ischemia EXAM: CT ANGIOGRAPHY HEAD AND NECK TECHNIQUE: Multidetector CT imaging of the head and neck was performed using the standard protocol during bolus administration of intravenous contrast. Multiplanar CT image reconstructions and MIPs were obtained to evaluate the vascular anatomy. Carotid stenosis measurements (when applicable) are obtained utilizing NASCET criteria, using the distal internal carotid diameter as the denominator. CONTRAST:  15 mL Isovue 370 IV COMPARISON:  Brain MRI/MRA 06/29/2016 FINDINGS: CTA NECK FINDINGS Aortic arch: There is no aneurysm or dissection of the visualized ascending aorta or aortic arch. There is a normal 3 vessel branching pattern. There is atherosclerotic calcification within the proximal subclavian arteries without  significant stenosis. Calcific aortic atherosclerosis. Right carotid system: There is mixed calcified and noncalcified atherosclerotic plaque of the common carotid artery resulting in approximately 50% stenosis. There is noncalcified plaque within the right internal carotid artery at the C3-C4 level the results in approximately 50% narrowing of the lumen. Left carotid system: There is mixed calcified and noncalcified plaque within the left common carotid artery without greater than 50% stenosis. There is mixed calcified and noncalcified plaque at the left carotid bifurcation resulting in approximately 80% stenosis. Vertebral arteries: There is atherosclerotic calcification at the vertebral artery origins bilaterally. The left vertebral artery is occluded proximally in shows no contrast enhancement along its V2 segment. Enhancement of the right V3 and V4 segments of likely retrograde. There is no hemodynamically significant stenosis of the dominant left vertebral artery. Skeleton: There is multilevel uncovertebral and facet hypertrophy preserved Ing and severe foraminal stenosis at left C3-4, right C4-5 and mild-to-moderate stenosis at the other cervical levels. Other neck: The nasopharynx is clear. The oropharynx and hypopharynx are normal. The epiglottis is normal. The supraglottic larynx, glottis and subglottic larynx are normal. No retropharyngeal collection. The parapharyngeal spaces are preserved. The parotid and submandibular glands are normal. No sialolithiasis or salivary ductal dilatation. The thyroid gland is normal. There is no cervical lymphadenopathy. Upper chest: There is biapical emphysema. Review of the MIP images confirms the above findings CTA HEAD FINDINGS Anterior circulation: --Intracranial internal carotid arteries: There is noncalcified plaque within the distal petrous segment of the right ICA and mixed calcified and noncalcified plaque within the cavernous and clinoid segments. There is  approximately 50% luminal narrowing of the cavernous segment. On the left, there is severe narrowing of the distal petrous segment. There is mixed calcified and noncalcified plaque of the cavernous and clinoid segments without advanced stenosis. --Anterior cerebral arteries: Normal. --Middle cerebral arteries: Normal. --Posterior communicating arteries: Present on the left. Posterior circulation: --Posterior cerebral arteries: Fetal origin of the left posterior cerebral artery. Normal right PCA. --Superior cerebellar arteries: Normal. --Basilar artery: Normal. --Anterior inferior cerebellar arteries: Not visualized, which is not uncommon. --Posterior inferior cerebellar arteries: Normal. Venous sinuses: As permitted by contrast timing, patent. Anatomic variants: Fetal origin of the left PCA. Delayed phase: No parenchymal contrast enhancement. Review of the MIP images confirms the above findings IMPRESSION: 1. No emergent intracranial large vessel occlusion. 2. Severe  stenosis of the proximal left internal carotid artery, measuring approximately 80% by NASCET criteria. 3. Severe stenosis of the left internal carotid artery distal petrous segment and approximately 50% stenosis of the cavernous segment of the left ICA and distal petrous segment of the right ICA. 4. Chronic occlusion of the right vertebral artery. 5. Bilateral common carotid artery stenosis of approximately 50%. 6. Aortic atherosclerosis and biapical emphysema. Electronically Signed   By: Ulyses Jarred M.D.   On: 06/29/2016 21:21   Dg Chest 2 View  Result Date: 06/29/2016 CLINICAL DATA:  Pain following fall EXAM: CHEST  2 VIEW COMPARISON:  August 04, 2015 FINDINGS: There is no edema or consolidation. Heart is upper normal in size with pulmonary vascularity within normal limits. No adenopathy. There is atherosclerotic calcification in the aorta. No pneumothorax. There is mild chronic acromioclavicular separation on the right. No fracture evident.  IMPRESSION: Chronic acromioclavicular separation on the right. No pneumothorax. No edema or consolidation. Stable cardiac silhouette. There is aortic atherosclerosis. Electronically Signed   By: Lowella Grip III M.D.   On: 06/29/2016 13:11   Ct Head Wo Contrast  Result Date: 06/29/2016 CLINICAL DATA:  Right-sided weakness, possible on witnessed fall, fatigue EXAM: CT HEAD WITHOUT CONTRAST TECHNIQUE: Contiguous axial images were obtained from the base of the skull through the vertex without intravenous contrast. COMPARISON:  CT brain scan of 08/04/2015 FINDINGS: Brain: There is no change in the degree of ventriculomegaly. Prominent cortical sulci also are noted diffusely consistent with diffuse atrophy. Moderate small vessel ischemic change is again noted throughout the periventricular white matter. No hemorrhage, mass lesion, or acute infarction is seen. Vascular: No vascular abnormality is seen on this unenhanced study. Skull: On bone window images, no calvarial abnormality is seen. Sinuses/Orbits: The paranasal sinuses clear. Other:  None. IMPRESSION: 1. Stable atrophy and moderate small vessel ischemic change. No acute intracranial abnormality. 2. No calvarial fracture is noted. Electronically Signed   By: Ivar Drape M.D.   On: 06/29/2016 12:04   Ct Angio Neck W Or Wo Contrast  Result Date: 06/29/2016 CLINICAL DATA:  Acute ischemia EXAM: CT ANGIOGRAPHY HEAD AND NECK TECHNIQUE: Multidetector CT imaging of the head and neck was performed using the standard protocol during bolus administration of intravenous contrast. Multiplanar CT image reconstructions and MIPs were obtained to evaluate the vascular anatomy. Carotid stenosis measurements (when applicable) are obtained utilizing NASCET criteria, using the distal internal carotid diameter as the denominator. CONTRAST:  15 mL Isovue 370 IV COMPARISON:  Brain MRI/MRA 06/29/2016 FINDINGS: CTA NECK FINDINGS Aortic arch: There is no aneurysm or dissection  of the visualized ascending aorta or aortic arch. There is a normal 3 vessel branching pattern. There is atherosclerotic calcification within the proximal subclavian arteries without significant stenosis. Calcific aortic atherosclerosis. Right carotid system: There is mixed calcified and noncalcified atherosclerotic plaque of the common carotid artery resulting in approximately 50% stenosis. There is noncalcified plaque within the right internal carotid artery at the C3-C4 level the results in approximately 50% narrowing of the lumen. Left carotid system: There is mixed calcified and noncalcified plaque within the left common carotid artery without greater than 50% stenosis. There is mixed calcified and noncalcified plaque at the left carotid bifurcation resulting in approximately 80% stenosis. Vertebral arteries: There is atherosclerotic calcification at the vertebral artery origins bilaterally. The left vertebral artery is occluded proximally in shows no contrast enhancement along its V2 segment. Enhancement of the right V3 and V4 segments of likely retrograde. There is no  hemodynamically significant stenosis of the dominant left vertebral artery. Skeleton: There is multilevel uncovertebral and facet hypertrophy preserved Ing and severe foraminal stenosis at left C3-4, right C4-5 and mild-to-moderate stenosis at the other cervical levels. Other neck: The nasopharynx is clear. The oropharynx and hypopharynx are normal. The epiglottis is normal. The supraglottic larynx, glottis and subglottic larynx are normal. No retropharyngeal collection. The parapharyngeal spaces are preserved. The parotid and submandibular glands are normal. No sialolithiasis or salivary ductal dilatation. The thyroid gland is normal. There is no cervical lymphadenopathy. Upper chest: There is biapical emphysema. Review of the MIP images confirms the above findings CTA HEAD FINDINGS Anterior circulation: --Intracranial internal carotid  arteries: There is noncalcified plaque within the distal petrous segment of the right ICA and mixed calcified and noncalcified plaque within the cavernous and clinoid segments. There is approximately 50% luminal narrowing of the cavernous segment. On the left, there is severe narrowing of the distal petrous segment. There is mixed calcified and noncalcified plaque of the cavernous and clinoid segments without advanced stenosis. --Anterior cerebral arteries: Normal. --Middle cerebral arteries: Normal. --Posterior communicating arteries: Present on the left. Posterior circulation: --Posterior cerebral arteries: Fetal origin of the left posterior cerebral artery. Normal right PCA. --Superior cerebellar arteries: Normal. --Basilar artery: Normal. --Anterior inferior cerebellar arteries: Not visualized, which is not uncommon. --Posterior inferior cerebellar arteries: Normal. Venous sinuses: As permitted by contrast timing, patent. Anatomic variants: Fetal origin of the left PCA. Delayed phase: No parenchymal contrast enhancement. Review of the MIP images confirms the above findings IMPRESSION: 1. No emergent intracranial large vessel occlusion. 2. Severe stenosis of the proximal left internal carotid artery, measuring approximately 80% by NASCET criteria. 3. Severe stenosis of the left internal carotid artery distal petrous segment and approximately 50% stenosis of the cavernous segment of the left ICA and distal petrous segment of the right ICA. 4. Chronic occlusion of the right vertebral artery. 5. Bilateral common carotid artery stenosis of approximately 50%. 6. Aortic atherosclerosis and biapical emphysema. Electronically Signed   By: Ulyses Jarred M.D.   On: 06/29/2016 21:21   Mr Jodene Nam Head Wo Contrast  Result Date: 06/29/2016 CLINICAL DATA:  Right-sided weakness for 3 weeks. Recurrent falls and aphasia. EXAM: MRI HEAD WITHOUT CONTRAST MRA HEAD WITHOUT CONTRAST TECHNIQUE: Multiplanar, multiecho pulse sequences of  the brain and surrounding structures were obtained without intravenous contrast. Angiographic images of the head were obtained using MRA technique without contrast. COMPARISON:  None. Head CT 06/29/2016 FINDINGS: MRI HEAD FINDINGS Brain: There are multiple small foci of diffusion restriction scattered throughout the left hemisphere, within the left frontal and parietal lobes. There is a single focus of diffusion restriction within the peripheral left occipital lobe (series 3, image 25). There is no evidence of acute hemorrhage. There is no midline shift or other mass effect. There is diffuse confluent hyperintense T2-weighted signal within the periventricular white matter, most often seen in the setting of chronic microvascular ischemia. No mass lesion or midline shift. No hydrocephalus or extra-axial fluid collection. The midline structures are normal. There is severe volume loss. The Vascular: Major intracranial arterial and venous sinus flow voids are preserved. No evidence of chronic microhemorrhage or amyloid angiopathy. Skull and upper cervical spine: The visualized skull base, calvarium, upper cervical spine and extracranial soft tissues are normal. Sinuses/Orbits: No fluid levels or advanced mucosal thickening. No mastoid effusion. Normal orbits. MRA HEAD FINDINGS Intracranial internal carotid arteries: There is approximately 50% narrowing of the petrous segments of the right internal carotid  artery. The remainder the visualized right ICA is normal. There is severe narrowing of the distal petrous/lacerum segments of the left internal carotid artery. The remainder of the left ICA is normal. Anterior cerebral arteries: Normal. Middle cerebral arteries: Normal. Posterior communicating arteries: Present bilaterally, but diminutive on the right. Posterior cerebral arteries: The left posterior cerebral artery has a fetal origin. The right PCA is normal. Basilar artery: Normal. Vertebral arteries: The vertebral  system is left dominant. There is no flow related enhancement seen within the right V3 segment or proximal V4 segment. Enhancement the distal V4 segment just proximal to the basilar confluence may be secondary to retrograde or collateral flow. Superior cerebellar arteries: Normal. Anterior inferior cerebellar arteries: Not clearly seen, which is not uncommon. Posterior inferior cerebellar arteries: Normal. IMPRESSION: 1. Multiple small foci of acute ischemia within the left hemisphere, predominantly within the left MCA distribution. There is a single focus in the left occipital lobe, which is in the PCA distribution. The appearance is consistent with an embolic source. Note that there is a fetal origin of the left PCA, so the left ICA could serve as an embolic source for the left MCA and PCA distributions. 2. Severe narrowing of the left internal carotid artery distal petrous and proximal lacerum segments. No emergent large vessel occlusion. 3. Mild stenosis of the right internal carotid artery petrous segment. 4. Lack of flow related enhancement within the V3 segment of the right vertebral artery, which is likely occluded proximally. Enhancement within the right V4 segment is likely secondary to collateral filling. 5. No hemorrhage or mass effect. Electronically Signed   By: Ulyses Jarred M.D.   On: 06/29/2016 15:46   Mr Brain Wo Contrast  Result Date: 06/29/2016 CLINICAL DATA:  Right-sided weakness for 3 weeks. Recurrent falls and aphasia. EXAM: MRI HEAD WITHOUT CONTRAST MRA HEAD WITHOUT CONTRAST TECHNIQUE: Multiplanar, multiecho pulse sequences of the brain and surrounding structures were obtained without intravenous contrast. Angiographic images of the head were obtained using MRA technique without contrast. COMPARISON:  None. Head CT 06/29/2016 FINDINGS: MRI HEAD FINDINGS Brain: There are multiple small foci of diffusion restriction scattered throughout the left hemisphere, within the left frontal and  parietal lobes. There is a single focus of diffusion restriction within the peripheral left occipital lobe (series 3, image 25). There is no evidence of acute hemorrhage. There is no midline shift or other mass effect. There is diffuse confluent hyperintense T2-weighted signal within the periventricular white matter, most often seen in the setting of chronic microvascular ischemia. No mass lesion or midline shift. No hydrocephalus or extra-axial fluid collection. The midline structures are normal. There is severe volume loss. The Vascular: Major intracranial arterial and venous sinus flow voids are preserved. No evidence of chronic microhemorrhage or amyloid angiopathy. Skull and upper cervical spine: The visualized skull base, calvarium, upper cervical spine and extracranial soft tissues are normal. Sinuses/Orbits: No fluid levels or advanced mucosal thickening. No mastoid effusion. Normal orbits. MRA HEAD FINDINGS Intracranial internal carotid arteries: There is approximately 50% narrowing of the petrous segments of the right internal carotid artery. The remainder the visualized right ICA is normal. There is severe narrowing of the distal petrous/lacerum segments of the left internal carotid artery. The remainder of the left ICA is normal. Anterior cerebral arteries: Normal. Middle cerebral arteries: Normal. Posterior communicating arteries: Present bilaterally, but diminutive on the right. Posterior cerebral arteries: The left posterior cerebral artery has a fetal origin. The right PCA is normal. Basilar artery: Normal. Vertebral  arteries: The vertebral system is left dominant. There is no flow related enhancement seen within the right V3 segment or proximal V4 segment. Enhancement the distal V4 segment just proximal to the basilar confluence may be secondary to retrograde or collateral flow. Superior cerebellar arteries: Normal. Anterior inferior cerebellar arteries: Not clearly seen, which is not uncommon.  Posterior inferior cerebellar arteries: Normal. IMPRESSION: 1. Multiple small foci of acute ischemia within the left hemisphere, predominantly within the left MCA distribution. There is a single focus in the left occipital lobe, which is in the PCA distribution. The appearance is consistent with an embolic source. Note that there is a fetal origin of the left PCA, so the left ICA could serve as an embolic source for the left MCA and PCA distributions. 2. Severe narrowing of the left internal carotid artery distal petrous and proximal lacerum segments. No emergent large vessel occlusion. 3. Mild stenosis of the right internal carotid artery petrous segment. 4. Lack of flow related enhancement within the V3 segment of the right vertebral artery, which is likely occluded proximally. Enhancement within the right V4 segment is likely secondary to collateral filling. 5. No hemorrhage or mass effect. Electronically Signed   By: Ulyses Jarred M.D.   On: 06/29/2016 15:46        Scheduled Meds: . aspirin  300 mg Rectal Daily   Or  . aspirin  325 mg Oral Daily  . atorvastatin  10 mg Oral Daily  . clopidogrel  75 mg Oral Daily  . enoxaparin (LOVENOX) injection  40 mg Subcutaneous Q24H  . insulin aspart  0-5 Units Subcutaneous QHS  . insulin aspart  0-9 Units Subcutaneous TID WC  . memantine  28 mg Oral Daily  . metoprolol tartrate  12.5 mg Oral BID  . pantoprazole  40 mg Oral Daily  . pentoxifylline  400 mg Oral TID WC  . potassium chloride  20 mEq Oral Daily  . sertraline  50 mg Oral Daily  . tamsulosin  0.4 mg Oral Daily   Continuous Infusions: . 0.9 % NaCl with KCl 20 mEq / L       LOS: 1 day    Dron Tanna Furry, MD [facility] Pager [phone]  If 7PM-7AM, please contact night-coverage [email or web address] Password [facility] 06/30/2016, 11:41 AM

## 2016-06-30 NOTE — Evaluation (Signed)
Physical Therapy Evaluation Patient Details Name: Zachary [name] MRN: [id number] DOB: 05-Apr-1922 Today's Date: 06/30/2016   History of Present Illness  83 y.o. male with medical history significant for CVA affecting left side in 2015 in the setting of right carotid stenosis, status post right carotid endarterectomy in 2015, dementia, BPH with history of urinary retention, hypertension, ? Diabetes although last hemoglobin A1c 2015 was 6.6, chronic leukocytosis, peripheral vascular disease on medical therapy, dyslipidemia.  Patient had an unwitnessed fall, slurred speech and weakness on right arm; pt found to have rhabdomyolysis.  MRI of the brain consistent with multiple small foci of acute ischemia within the left hemisphere predominantly within the left MCA distribution  Clinical Impression  Pt admitted with above diagnosis. Pt currently with functional limitations due to the deficits listed below (see PT Problem List).  Pt will benefit from skilled PT to increase their independence and safety with mobility to allow discharge to the venue listed below.   Pt presents with generalized weakness, R UE weakness, decreased endurance, poor coordination, and balance deficits.  Pt has been living with his spouse (who also has dementia), mobilizes with RW and/or SPC, and has caregiver assist for a couple hours daily.  Pt requiring at least mod assist today and presents as an increased fall risk.  Recommend SNF upon d/c.    Follow Up Recommendations SNF;Supervision/Assistance - 24 hour    Equipment Recommendations  None recommended by PT    Recommendations for Other Services       Precautions / Restrictions Precautions Precautions: Fall      Mobility  Bed Mobility Overal bed mobility: Needs Assistance Bed Mobility: Supine to Sit;Sit to Supine     Supine to sit: Mod assist;+2 for safety/equipment Sit to supine: Min guard   General bed mobility comments: assist for trunk upright and scooting  to EOB, cues for weight shifting to EOB  Transfers Overall transfer level: Needs assistance Equipment used: Rolling walker (2 wheeled) Transfers: Sit to/from Stand Sit to Stand: Min assist;+2 safety/equipment         General transfer comment: verbal cues for safe technique, cues for hand placement, pt attempting to stand and return to sitting despite cues to wait for therapists (lines/etc)  Ambulation/Gait Ambulation/Gait assistance: +2 safety/equipment;Mod assist Ambulation Distance (Feet): 16 Feet Assistive device: Rolling walker (2 wheeled) Gait Pattern/deviations: Decreased stride length;Step-through pattern;Decreased weight shift to right     General Gait Details: mulitmodal cues for hand placement, sequencing, RW positioning; tends to remain on outside of RW on left side with multimodal cues for remain inside/middle of RW; veers to right side (likely due to R UE weakness); mod assist for challenges like turning  Stairs            Wheelchair Mobility    Modified Rankin (Stroke Patients Only) Modified Rankin (Stroke Patients Only) Pre-Morbid Rankin Score: Moderately severe disability Modified Rankin: Severe disability     Balance Overall balance assessment: Needs assistance;History of Falls         Standing balance support: Bilateral upper extremity supported Standing balance-Leahy Scale: Zero Standing balance comment: requires UE support and external assist with challenges                             Pertinent Vitals/Pain Pain Assessment: Faces (no c/o pain) Faces Pain Scale: Hurts a little bit Pain Intervention(s): Monitored during session    Home Living Family/patient expects to be discharged to:: Private  residence Living Arrangements: Spouse/significant other Available Help at Discharge: Family;Personal care attendant Type of Home: House Home Access: Stairs to enter Entrance Stairs-Rails: Can reach both Entrance Stairs-Number of Steps:  2-3 Home Layout: Two level;Able to live on main level with bedroom/bathroom;1/2 bath on main level Home Equipment: Walker - 2 wheels;Cane - single point      Prior Function Level of Independence: Needs assistance   Gait / Transfers Assistance Needed: mod I/supervision with SPC or RW houshold distances  ADL's / Homemaking Assistance Needed: assistance from personal care aide  Comments: pt also assists with care for spouse     Hand Dominance        Extremity/Trunk Assessment   Upper Extremity Assessment Upper Extremity Assessment: RUE deficits/detail;Defer to OT evaluation    Lower Extremity Assessment Lower Extremity Assessment: Generalized weakness;RLE deficits/detail;LLE deficits/detail RLE Deficits / Details: not able to hold MMT, ?cognition, able to perform AROM, poor coordination RLE Coordination: decreased gross motor LLE Deficits / Details: not able to hold MMT, ?cognition, able to perform AROM, poor coordination LLE Coordination: decreased gross motor    Cervical / Trunk Assessment Cervical / Trunk Assessment: Kyphotic  Communication   Communication: No difficulties  Cognition Arousal/Alertness: Awake/alert Behavior During Therapy: Impulsive Overall Cognitive Status: History of cognitive impairments - at baseline                 General Comments: hx dementia, requires safety cues, impulsive    General Comments      Exercises     Assessment/Plan    PT Assessment Patient needs continued PT services  PT Problem List Decreased strength;Decreased activity tolerance;Decreased balance;Decreased knowledge of use of DME;Decreased cognition;Decreased coordination;Decreased mobility;Decreased safety awareness       PT Treatment Interventions DME instruction;Gait training;Therapeutic activities;Therapeutic exercise;Patient/family education;Functional mobility training;Balance training;Neuromuscular re-education    PT Goals (Current goals can be found in the  Care Plan section)  Acute Rehab PT Goals PT Goal Formulation: With patient/family Time For Goal Achievement: 07/14/16 Potential to Achieve Goals: Good    Frequency Min 3X/week   Barriers to discharge        Co-evaluation PT/OT/SLP Co-Evaluation/Treatment: Yes Reason for Co-Treatment: For patient/therapist safety;To address functional/ADL transfers PT goals addressed during session: Mobility/safety with mobility OT goals addressed during session: ADL's and self-care       End of Session Equipment Utilized During Treatment: Gait belt Activity Tolerance: Patient limited by fatigue Patient left: in bed;with bed alarm set;with family/visitor present;with call bell/phone within reach   PT Visit Diagnosis: Difficulty in walking, not elsewhere classified (R26.2)         Time: 6333-5456 PT Time Calculation (min) (ACUTE ONLY): 18 min   Charges:   PT Evaluation $PT Eval Moderate Complexity: 1 Procedure     PT G Codes:         Satia Winger,KATHrine E 06/30/2016, 4:07 PM Carmelia Bake, PT, DPT 06/30/2016 Pager: [phone]

## 2016-06-30 NOTE — Progress Notes (Signed)
Discussed with Dr. Erlinda Hong from neurology.  Vascular surgery consult requested to evaluate left ICA stenosis 80 %.

## 2016-06-30 NOTE — Evaluation (Signed)
Clinical/Bedside Swallow Evaluation Patient Details  Name: Zachary [name] MRN: [id number] Date of Birth: Nov 11, 1921  Today's Date: 06/30/2016 Time: SLP Start Time (ACUTE ONLY): 5465 SLP Stop Time (ACUTE ONLY): 1158 SLP Time Calculation (min) (ACUTE ONLY): 26 min  Past Medical History:  Past Medical History:  Diagnosis Date  . Arthritis   . Carotid artery occlusion   . GERD (gastroesophageal reflux disease)   . Headache    "sometimes monthly" (06/29/2016)  . Heart block   . History of blood transfusion    "while in the service"  . History of stomach ulcers   . Hypertension   . Insomnia   . Pancreatitis   . Prostate cancer ([place])    Archie Endo 09/02/2010  . Prostate enlargement   . PVC's (premature ventricular contractions)   . PVD (peripheral vascular disease) ([place])   . Stroke ([place]) 05/2013; 06/29/2016   Archie Endo 05/24/2013  . Type II diabetes mellitus ([place]) dx'd 2008   Archie Endo 08/19/2010   Past Surgical History:  Past Surgical History:  Procedure Laterality Date  . CATARACT EXTRACTION W/ INTRAOCULAR LENS  IMPLANT, BILATERAL Bilateral   . CHOLECYSTECTOMY  07/18/2011   Procedure: LAPAROSCOPIC CHOLECYSTECTOMY;  Surgeon: Gwenyth Ober, MD;  Location: [place];  Service: General;  Laterality: N/A;  . ENDARTERECTOMY Right 05/28/2013   Procedure: ENDARTERECTOMY CAROTID;  Surgeon: Rosetta Posner, MD;  Location: [place];  Service: Vascular;  Laterality: Right;  . ESOPHAGOGASTRODUODENOSCOPY (EGD) WITH ESOPHAGEAL DILATION  2004; 12/2009   Archie Endo 01/08/2010  . FLEXIBLE SIGMOIDOSCOPY N/A 09/23/2014   Procedure: FLEXIBLE SIGMOIDOSCOPY;  Surgeon: Garlan Fair, MD;  Location: [facility];  Service: Endoscopy;  Laterality: N/A;  . INSERTION PROSTATE RADIATION SEED  03/1999   Archie Endo 09/02/2010  . PROSTATE BIOPSY  2000   Archie Endo 09/02/2010  . TONSILLECTOMY     HPI:  Zachary [name] a 83 y.o.malewith medical history significant for CVA affecting left side in 2015, right carotid endarterectomy in  2015,dementia,HTN, GERD, DM prostate ca, chronic leukocytosi. Admitted after unwitnessed fall (although he reported to staff that he just sat down the floor because he was tired), slurred speech and weakness of the right arm. MRI Multiple small foci of acute ischemia within the left hemisphere, predominantly within the left MCA distribution. There is a single focus in the left occipital lobe. Chronic acromioclavicular separation on the right.   Assessment / Plan / Recommendation Clinical Impression  Pt's caregiver reports pt briefly holds liquid/food prior to swallowing and "gets strangled about 3 times a week." Pt held/hesitated in transitioning thin liquid/puree possibly as a subconscious strategy. No s/s of decreased airway protection during this assessment however potential is higher given history of dementia and new CVA. Reiterated/educated importance of proper positioning, attention to small bites/sips, cup sips primarily and avoid mixing textures in oral cavity. Pt has not had pna in many years. Recommend continue regular texture, thin liquids, meds crushed (difficulty with whole pills this morning) and strategies aformentioned. ST to follow.   SLP Visit Diagnosis: Dysphagia, unspecified (R13.10)    Aspiration Risk   (mild-mod)    Diet Recommendation Regular;Thin liquid   Liquid Administration via: No straw;Cup Medication Administration: Crushed with puree Supervision: Patient able to self feed;Full supervision/cueing for compensatory strategies Compensations: Slow rate;Small sips/bites Postural Changes: Seated upright at 90 degrees    Other  Recommendations Oral Care Recommendations: Oral care BID   Follow up Recommendations None      Frequency and Duration min 2x/week  2 weeks  Prognosis Prognosis for Safe Diet Advancement: Fair Barriers to Reach Goals: Cognitive deficits      Swallow Study   General HPI: Zachary [name] a 83 y.o.malewith medical history  significant for CVA affecting left side in 2015, right carotid endarterectomy in 2015,dementia,HTN, GERD, DM prostate ca, chronic leukocytosi. Admitted after unwitnessed fall (although he reported to staff that he just sat down the floor because he was tired), slurred speech and weakness of the right arm. MRI Multiple small foci of acute ischemia within the left hemisphere, predominantly within the left MCA distribution. There is a single focus in the left occipital lobe. Chronic acromioclavicular separation on the right. Type of Study: Bedside Swallow Evaluation Previous Swallow Assessment:  (none) Diet Prior to this Study: Regular;Thin liquids Temperature Spikes Noted: No Respiratory Status: Room air History of Recent Intubation: No Behavior/Cognition: Alert;Cooperative;Pleasant mood;Requires cueing Oral Cavity Assessment: Within Functional Limits Oral Care Completed by SLP: No Oral Cavity - Dentition:  (missing 3-4 upper and lower) Vision: Functional for self-feeding Self-Feeding Abilities: Needs set up Patient Positioning: Upright in bed Baseline Vocal Quality: Normal Volitional Cough: Strong Volitional Swallow: Able to elicit    Oral/Motor/Sensory Function Overall Oral Motor/Sensory Function: Moderate impairment Facial ROM: Reduced right;Suspected CN VII (facial) dysfunction Facial Symmetry: Abnormal symmetry right;Suspected CN VII (facial) dysfunction Facial Strength: Reduced right;Suspected CN VII (facial) dysfunction Lingual ROM: Within Functional Limits Lingual Symmetry: Abnormal symmetry right;Suspected CN XII (hypoglossal) dysfunction Lingual Strength: Within Functional Limits   Ice Chips Ice chips: Not tested   Thin Liquid Thin Liquid: Impaired Presentation: Cup;Straw Oral Phase Functional Implications: Oral holding    Nectar Thick Nectar Thick Liquid: Not tested   Honey Thick Honey Thick Liquid: Not tested   Puree Puree: Impaired Oral Phase Functional Implications:  Oral holding Pharyngeal Phase Impairments:  (none)   Solid   GO   Solid: Within functional limits        [name] 06/30/2016,1:40 PM   Orbie Pyo [place].Ed [employer] [phone]

## 2016-06-30 NOTE — Consult Note (Signed)
CARDIOLOGY CONSULT NOTE   Patient ID: Zachary [name] MRN: [id number] DOB/AGE: [birth year] 81 y.o.  Admit date: 06/29/2016  Primary Physician   [name], MD Primary Cardiologist   Dr. Irish Lack  Reason for Consultation   Bradycardia Requesting Physician  Dr. Carolin Sicks  HPI: Zachary [name] is a 83 y.o. male with a history of CVA left side in 2015in the setting of right carotid stenosis s/p R ECA, Dementia, BPH, hypertension, frequent falls, PVD, PVCs present for evaluation of fall and slurred speech with right arm weakness.  The patient was seen by Dr. Irish Lack 09/18/15 for post hospital follow-up. He was admitted with lightheadedness also mentioned as syncope. Symptoms improved with hydration. Advised to continue low-dose metoprolol. Plan to get monitor if recurrent symptoms.  The patient lives with wife who is also demented. Patient has a caregiver service at home. Last baseline. She was on Saturday 3/10. Patient was unable to seen by caregiver on Monday due to weather. Yesterday noted slurred speech by caregiver. Patient has a unwitnessed fall near bedside. Caregiver heard a noise and went to the patient home and noted on a floor. Noted worsening speech with right arm weakness and EMS was called.  CT of the head without acute abnormality. MR of brain showed multiple ischemic infarcts in the left ICA distribution. Proximal left internal carotid artery,measuring approximately 80% by NASCET criteria. CK 4388. Worsening leukocytosis from baseline.  CK improved to 2444. Potassium of 3.3. UDS clear. Unable to find EKG in Epic or chart. Telemetry showed sinus rhythm at rate of 60 with intermittent dropping to high 30s. Frequent PACs and PVCs. Cardiology is asked for further evaluation. He is on metoprolol 12.5mg  BID. Echo done, pending reading.   CT of angio of head/neck IMPRESSION: 1. No emergent intracranial large vessel occlusion. 2. Severe stenosis of the proximal left internal  carotid artery, measuring approximately 80% by NASCET criteria. 3. Severe stenosis of the left internal carotid artery distal petrous segment and approximately 50% stenosis of the cavernous segment of the left ICA and distal petrous segment of the right ICA. 4. Chronic occlusion of the right vertebral artery. 5. Bilateral common carotid artery stenosis of approximately 50%. 6. Aortic atherosclerosis and biapical emphysema.  MR of head IMPRESSION: 1. Multiple small foci of acute ischemia within the left hemisphere, predominantly within the left MCA distribution. There is a single focus in the left occipital lobe, which is in the PCA distribution. The appearance is consistent with an embolic source. Note that there is a fetal origin of the left PCA, so the left ICA could serve as an embolic source for the left MCA and PCA distributions. 2. Severe narrowing of the left internal carotid artery distal petrous and proximal lacerum segments. No emergent large vessel occlusion. 3. Mild stenosis of the right internal carotid artery petrous segment. 4. Lack of flow related enhancement within the V3 segment of the right vertebral artery, which is likely occluded proximally. Enhancement within the right V4 segment is likely secondary to collateral filling. 5. No hemorrhage or mass effect.  Past Medical History:  Diagnosis Date  . Arthritis   . Carotid artery occlusion   . GERD (gastroesophageal reflux disease)   . Headache    "sometimes monthly" (06/29/2016)  . Heart block   . History of blood transfusion    "while in the service"  . History of stomach ulcers   . Hypertension   . Insomnia   . Pancreatitis   . Prostate cancer (  [place])    Zachary [name] 09/02/2010  . Prostate enlargement   . PVC's (premature ventricular contractions)   . PVD (peripheral vascular disease) ([place])   . Stroke ([place]) 05/2013; 06/29/2016   Zachary [name] 05/24/2013  . Type II diabetes mellitus ([place]) dx'd 2008   Zachary [name] 08/19/2010      Past Surgical History:  Procedure Laterality Date  . CATARACT EXTRACTION W/ INTRAOCULAR LENS  IMPLANT, BILATERAL Bilateral   . CHOLECYSTECTOMY  07/18/2011   Procedure: LAPAROSCOPIC CHOLECYSTECTOMY;  Surgeon: Gwenyth Ober, MD;  Location: [place];  Service: General;  Laterality: N/A;  . ENDARTERECTOMY Right 05/28/2013   Procedure: ENDARTERECTOMY CAROTID;  Surgeon: Rosetta Posner, MD;  Location: [place];  Service: Vascular;  Laterality: Right;  . ESOPHAGOGASTRODUODENOSCOPY (EGD) WITH ESOPHAGEAL DILATION  2004; 12/2009   Zachary [name] 01/08/2010  . FLEXIBLE SIGMOIDOSCOPY N/A 09/23/2014   Procedure: FLEXIBLE SIGMOIDOSCOPY;  Surgeon: Garlan Fair, MD;  Location: [facility];  Service: Endoscopy;  Laterality: N/A;  . INSERTION PROSTATE RADIATION SEED  03/1999   Zachary [name] 09/02/2010  . PROSTATE BIOPSY  2000   Zachary [name] 09/02/2010  . TONSILLECTOMY      Allergies  Allergen Reactions  . Penicillins Rash    Has patient had a PCN reaction causing immediate rash, facial/tongue/throat swelling, SOB or lightheadedness with hypotension: No Has patient had a PCN reaction causing severe rash involving mucus membranes or skin necrosis: No Has patient had a PCN reaction that required hospitalization No Has patient had a PCN reaction occurring within the last 10 years: No If all of the above answers are "NO", then may proceed with Cephalosporin use.    I have reviewed the patient's current medications . aspirin  300 mg Rectal Daily   Or  . aspirin  325 mg Oral Daily  . atorvastatin  10 mg Oral Daily  . clopidogrel  75 mg Oral Daily  . enoxaparin (LOVENOX) injection  40 mg Subcutaneous Q24H  . insulin aspart  0-5 Units Subcutaneous QHS  . insulin aspart  0-9 Units Subcutaneous TID WC  . memantine  28 mg Oral Daily  . metoprolol tartrate  12.5 mg Oral BID  . pantoprazole  40 mg Oral Daily  . pentoxifylline  400 mg Oral TID WC  . potassium chloride  20 mEq Oral Daily  . sertraline  50 mg Oral Daily  . tamsulosin   0.4 mg Oral Daily   . 0.9 % NaCl with KCl 20 mEq / L     acetaminophen **OR** acetaminophen (TYLENOL) oral liquid 160 mg/5 mL **OR** acetaminophen, fluticasone  Prior to Admission medications   Medication Sig Start Date End Date Taking? Authorizing Provider  atorvastatin (LIPITOR) 10 MG tablet Take 1 tablet (10 mg total) by mouth daily. 05/30/13  Yes Marius Ditch, MD  ergocalciferol (VITAMIN D2) 50000 UNITS capsule Take 50,000 Units by mouth once a week. Friday   Yes Historical Provider, MD  fluticasone (FLONASE) 50 MCG/ACT nasal spray Place 1 spray into both nostrils daily as needed for allergies or rhinitis.   Yes Historical Provider, MD  glimepiride (AMARYL) 4 MG tablet Take 4 mg by mouth daily with breakfast.   Yes Historical Provider, MD  Memantine HCl ER (NAMENDA XR) 28 MG CP24 Take 28 mg by mouth daily.    Yes Historical Provider, MD  metFORMIN (GLUCOPHAGE) 500 MG tablet Take 500 mg by mouth 2 (two) times daily with a meal.   Yes Historical Provider, MD  metoprolol tartrate (LOPRESSOR) 25 MG tablet Take 0.5 tablets (  12.5 mg total) by mouth 2 (two) times daily. 05/25/13  Yes Marius Ditch, MD  Multiple Vitamin (MULTIVITAMIN WITH MINERALS) TABS Take 1 tablet by mouth daily.   Yes Historical Provider, MD  omeprazole (PRILOSEC) 20 MG capsule Take 20 mg by mouth daily.   Yes Historical Provider, MD  pentoxifylline (TRENTAL) 400 MG CR tablet Take 400 mg by mouth 3 (three) times daily with meals.   Yes Historical Provider, MD  sertraline (ZOLOFT) 50 MG tablet Take 50 mg by mouth daily.   Yes Historical Provider, MD  Tamsulosin HCl (FLOMAX) 0.4 MG CAPS Take 0.4 mg by mouth daily.    Yes Historical Provider, MD  zolpidem (AMBIEN) 5 MG tablet Take 5 mg by mouth at bedtime as needed for sleep.   Yes Historical Provider, MD     Social History   Social History  . Marital status: Married    Spouse name: N/A  . Number of children: N/A  . Years of education: N/A   Occupational History  .  retired     [place] History Main Topics  . Smoking status: Former Smoker    Packs/day: 3.00    Years: 20.00    Quit date: 04/19/1962  . Smokeless tobacco: Never Used  . Alcohol use No  . Drug use: No  . Sexual activity: Not Currently   Other Topics Concern  . Not on file   Social History Narrative  . No narrative on file    Family Status  Relation Status  . Daughter Deceased at age 31   breast cancer  . Father Deceased   stroke  . Mother Deceased   unknown cause  . Sister Alive   healthy  . Son Alive   healthy  .    Marland Kitchen Neg Hx    Family History  Problem Relation Age of Onset  . Breast cancer Daughter   . Cancer Daughter 78    died of cancer   . Stroke Father   . Other      hypogonadism  . Heart attack Neg Hx   . Hypertension Neg Hx      ROS:  Full 14 point review of systems complete and found to be negative unless listed above.  Physical Exam: Blood pressure (!) 152/50, pulse 79, temperature 98.4 F (36.9 C), temperature source Oral, resp. rate 18, height 5\' 11"  (1.803 m), weight 194 lb (88 kg), SpO2 98 %.  General: Well developed, well nourished, male in no acute distress Head: Eyes PERRLA, No xanthomas. Normocephalic and atraumatic, oropharynx without edema or exudate. R sided facial drop.  Lungs: Resp regular and unlabored, CTA. Heart: RRR no s3, s4, or murmurs..   Neck: bilateral carotid bruits L > R. No lymphadenopathy. No  JVD. Abdomen: Bowel sounds present, abdomen soft and non-tender without masses or hernias noted. Msk:  No spine or cva tenderness. No weakness, no joint deformities or effusions. Extremities: No clubbing, cyanosis or edema. DP/PT/Radials 2+ and equal bilaterally. Neuro: Alert and oriented X 3. No focal deficits noted. Psych:  Good affect, responds appropriately Skin: No rashes or lesions noted.  Labs:   Lab Results  Component Value Date   WBC 14.9 (H) 06/30/2016   HGB 14.0 06/30/2016   HCT 40.5 06/30/2016     MCV 91.4 06/30/2016   PLT 219 06/30/2016    Recent Labs  06/29/16 1132  INR 1.10    Recent Labs Lab 06/30/16 0608  NA 140  K 3.3*  CL 106  CO2 21*  BUN 21*  CREATININE 1.07  CALCIUM 8.4*  PROT 5.8*  BILITOT 1.4*  ALKPHOS 50  ALT 31  AST 94*  GLUCOSE 122*  ALBUMIN 3.0*   Magnesium  Date Value Ref Range Status  06/29/2016 1.8 1.7 - 2.4 mg/dL Final    Recent Labs  06/29/16 1132 06/30/16 0608  CKTOTAL 4,388* 2,444*    Recent Labs  06/29/16 1140  TROPIPOC 0.03   No results found for: PROBNP Lab Results  Component Value Date   CHOL 147 06/30/2016   HDL 25 (L) 06/30/2016   LDLCALC 99 06/30/2016   TRIG 113 06/30/2016   No results found for: DDIMER Lipase  Date/Time Value Ref Range Status  05/20/2013 06:50 PM 25 11 - 59 U/L Final   Amylase  Date/Time Value Ref Range Status  12/19/2008 04:07 AM 194 (H) 27 - 131 U/L Final   TSH  Date/Time Value Ref Range Status  08/05/2015 04:48 AM 3.779 0.350 - 4.500 uIU/mL Final  05/21/2013 03:23 AM 2.258 0.350 - 4.500 uIU/mL Final    Comment:    Performed at [facility]   Vitamin B-12  Date/Time Value Ref Range Status  01/07/2014 03:35 PM 314 211 - 911 pg/mL Final   Folate  Date/Time Value Ref Range Status  01/07/2014 03:35 PM >20.0 ng/mL Final    Comment:      Reference Ranges         Deficient:       0.4 - 3.3 ng/mL         Indeterminate:   3.4 - 5.4 ng/mL         Normal:              > 5.4 ng/mL     Echo 05/21/13 Study Conclusions  - Left ventricle: The cavity size was normal. Wall thickness was increased in a pattern of mild LVH. Systolic function was normal. The estimated ejection fraction was in the range of 55% to 60%. - Aorta: Ascending aorta is mildly dilated at 41 mm. - Pulmonary arteries: PA peak pressure: 44mm Hg (S).  Radiology:  Ct Angio Head W Or Wo Contrast  Result Date: 06/29/2016 CLINICAL DATA:  Acute ischemia EXAM: CT ANGIOGRAPHY HEAD AND NECK TECHNIQUE:  Multidetector CT imaging of the head and neck was performed using the standard protocol during bolus administration of intravenous contrast. Multiplanar CT image reconstructions and MIPs were obtained to evaluate the vascular anatomy. Carotid stenosis measurements (when applicable) are obtained utilizing NASCET criteria, using the distal internal carotid diameter as the denominator. CONTRAST:  15 mL Isovue 370 IV COMPARISON:  Brain MRI/MRA 06/29/2016 FINDINGS: CTA NECK FINDINGS Aortic arch: There is no aneurysm or dissection of the visualized ascending aorta or aortic arch. There is a normal 3 vessel branching pattern. There is atherosclerotic calcification within the proximal subclavian arteries without significant stenosis. Calcific aortic atherosclerosis. Right carotid system: There is mixed calcified and noncalcified atherosclerotic plaque of the common carotid artery resulting in approximately 50% stenosis. There is noncalcified plaque within the right internal carotid artery at the C3-C4 level the results in approximately 50% narrowing of the lumen. Left carotid system: There is mixed calcified and noncalcified plaque within the left common carotid artery without greater than 50% stenosis. There is mixed calcified and noncalcified plaque at the left carotid bifurcation resulting in approximately 80% stenosis. Vertebral arteries: There is atherosclerotic calcification at the vertebral artery origins bilaterally. The left vertebral artery is  occluded proximally in shows no contrast enhancement along its V2 segment. Enhancement of the right V3 and V4 segments of likely retrograde. There is no hemodynamically significant stenosis of the dominant left vertebral artery. Skeleton: There is multilevel uncovertebral and facet hypertrophy preserved Ing and severe foraminal stenosis at left C3-4, right C4-5 and mild-to-moderate stenosis at the other cervical levels. Other neck: The nasopharynx is clear. The oropharynx  and hypopharynx are normal. The epiglottis is normal. The supraglottic larynx, glottis and subglottic larynx are normal. No retropharyngeal collection. The parapharyngeal spaces are preserved. The parotid and submandibular glands are normal. No sialolithiasis or salivary ductal dilatation. The thyroid gland is normal. There is no cervical lymphadenopathy. Upper chest: There is biapical emphysema. Review of the MIP images confirms the above findings CTA HEAD FINDINGS Anterior circulation: --Intracranial internal carotid arteries: There is noncalcified plaque within the distal petrous segment of the right ICA and mixed calcified and noncalcified plaque within the cavernous and clinoid segments. There is approximately 50% luminal narrowing of the cavernous segment. On the left, there is severe narrowing of the distal petrous segment. There is mixed calcified and noncalcified plaque of the cavernous and clinoid segments without advanced stenosis. --Anterior cerebral arteries: Normal. --Middle cerebral arteries: Normal. --Posterior communicating arteries: Present on the left. Posterior circulation: --Posterior cerebral arteries: Fetal origin of the left posterior cerebral artery. Normal right PCA. --Superior cerebellar arteries: Normal. --Basilar artery: Normal. --Anterior inferior cerebellar arteries: Not visualized, which is not uncommon. --Posterior inferior cerebellar arteries: Normal. Venous sinuses: As permitted by contrast timing, patent. Anatomic variants: Fetal origin of the left PCA. Delayed phase: No parenchymal contrast enhancement. Review of the MIP images confirms the above findings IMPRESSION: 1. No emergent intracranial large vessel occlusion. 2. Severe stenosis of the proximal left internal carotid artery, measuring approximately 80% by NASCET criteria. 3. Severe stenosis of the left internal carotid artery distal petrous segment and approximately 50% stenosis of the cavernous segment of the left ICA and  distal petrous segment of the right ICA. 4. Chronic occlusion of the right vertebral artery. 5. Bilateral common carotid artery stenosis of approximately 50%. 6. Aortic atherosclerosis and biapical emphysema. Electronically Signed   By: Ulyses Jarred M.D.   On: 06/29/2016 21:21   Dg Chest 2 View  Result Date: 06/29/2016 CLINICAL DATA:  Pain following fall EXAM: CHEST  2 VIEW COMPARISON:  August 04, 2015 FINDINGS: There is no edema or consolidation. Heart is upper normal in size with pulmonary vascularity within normal limits. No adenopathy. There is atherosclerotic calcification in the aorta. No pneumothorax. There is mild chronic acromioclavicular separation on the right. No fracture evident. IMPRESSION: Chronic acromioclavicular separation on the right. No pneumothorax. No edema or consolidation. Stable cardiac silhouette. There is aortic atherosclerosis. Electronically Signed   By: Lowella Grip III M.D.   On: 06/29/2016 13:11   Ct Head Wo Contrast  Result Date: 06/29/2016 CLINICAL DATA:  Right-sided weakness, possible on witnessed fall, fatigue EXAM: CT HEAD WITHOUT CONTRAST TECHNIQUE: Contiguous axial images were obtained from the base of the skull through the vertex without intravenous contrast. COMPARISON:  CT brain scan of 08/04/2015 FINDINGS: Brain: There is no change in the degree of ventriculomegaly. Prominent cortical sulci also are noted diffusely consistent with diffuse atrophy. Moderate small vessel ischemic change is again noted throughout the periventricular white matter. No hemorrhage, mass lesion, or acute infarction is seen. Vascular: No vascular abnormality is seen on Zachary unenhanced study. Skull: On bone window images, no calvarial abnormality is seen.  Sinuses/Orbits: The paranasal sinuses clear. Other:  None. IMPRESSION: 1. Stable atrophy and moderate small vessel ischemic change. No acute intracranial abnormality. 2. No calvarial fracture is noted. Electronically Signed   By: Ivar Drape M.D.   On: 06/29/2016 12:04   Ct Angio Neck W Or Wo Contrast  Result Date: 06/29/2016 CLINICAL DATA:  Acute ischemia EXAM: CT ANGIOGRAPHY HEAD AND NECK TECHNIQUE: Multidetector CT imaging of the head and neck was performed using the standard protocol during bolus administration of intravenous contrast. Multiplanar CT image reconstructions and MIPs were obtained to evaluate the vascular anatomy. Carotid stenosis measurements (when applicable) are obtained utilizing NASCET criteria, using the distal internal carotid diameter as the denominator. CONTRAST:  15 mL Isovue 370 IV COMPARISON:  Brain MRI/MRA 06/29/2016 FINDINGS: CTA NECK FINDINGS Aortic arch: There is no aneurysm or dissection of the visualized ascending aorta or aortic arch. There is a normal 3 vessel branching pattern. There is atherosclerotic calcification within the proximal subclavian arteries without significant stenosis. Calcific aortic atherosclerosis. Right carotid system: There is mixed calcified and noncalcified atherosclerotic plaque of the common carotid artery resulting in approximately 50% stenosis. There is noncalcified plaque within the right internal carotid artery at the C3-C4 level the results in approximately 50% narrowing of the lumen. Left carotid system: There is mixed calcified and noncalcified plaque within the left common carotid artery without greater than 50% stenosis. There is mixed calcified and noncalcified plaque at the left carotid bifurcation resulting in approximately 80% stenosis. Vertebral arteries: There is atherosclerotic calcification at the vertebral artery origins bilaterally. The left vertebral artery is occluded proximally in shows no contrast enhancement along its V2 segment. Enhancement of the right V3 and V4 segments of likely retrograde. There is no hemodynamically significant stenosis of the dominant left vertebral artery. Skeleton: There is multilevel uncovertebral and facet hypertrophy preserved  Ing and severe foraminal stenosis at left C3-4, right C4-5 and mild-to-moderate stenosis at the other cervical levels. Other neck: The nasopharynx is clear. The oropharynx and hypopharynx are normal. The epiglottis is normal. The supraglottic larynx, glottis and subglottic larynx are normal. No retropharyngeal collection. The parapharyngeal spaces are preserved. The parotid and submandibular glands are normal. No sialolithiasis or salivary ductal dilatation. The thyroid gland is normal. There is no cervical lymphadenopathy. Upper chest: There is biapical emphysema. Review of the MIP images confirms the above findings CTA HEAD FINDINGS Anterior circulation: --Intracranial internal carotid arteries: There is noncalcified plaque within the distal petrous segment of the right ICA and mixed calcified and noncalcified plaque within the cavernous and clinoid segments. There is approximately 50% luminal narrowing of the cavernous segment. On the left, there is severe narrowing of the distal petrous segment. There is mixed calcified and noncalcified plaque of the cavernous and clinoid segments without advanced stenosis. --Anterior cerebral arteries: Normal. --Middle cerebral arteries: Normal. --Posterior communicating arteries: Present on the left. Posterior circulation: --Posterior cerebral arteries: Fetal origin of the left posterior cerebral artery. Normal right PCA. --Superior cerebellar arteries: Normal. --Basilar artery: Normal. --Anterior inferior cerebellar arteries: Not visualized, which is not uncommon. --Posterior inferior cerebellar arteries: Normal. Venous sinuses: As permitted by contrast timing, patent. Anatomic variants: Fetal origin of the left PCA. Delayed phase: No parenchymal contrast enhancement. Review of the MIP images confirms the above findings IMPRESSION: 1. No emergent intracranial large vessel occlusion. 2. Severe stenosis of the proximal left internal carotid artery, measuring approximately 80%  by NASCET criteria. 3. Severe stenosis of the left internal carotid artery distal petrous segment  and approximately 50% stenosis of the cavernous segment of the left ICA and distal petrous segment of the right ICA. 4. Chronic occlusion of the right vertebral artery. 5. Bilateral common carotid artery stenosis of approximately 50%. 6. Aortic atherosclerosis and biapical emphysema. Electronically Signed   By: Ulyses Jarred M.D.   On: 06/29/2016 21:21   Mr Jodene Nam Head Wo Contrast  Result Date: 06/29/2016 CLINICAL DATA:  Right-sided weakness for 3 weeks. Recurrent falls and aphasia. EXAM: MRI HEAD WITHOUT CONTRAST MRA HEAD WITHOUT CONTRAST TECHNIQUE: Multiplanar, multiecho pulse sequences of the brain and surrounding structures were obtained without intravenous contrast. Angiographic images of the head were obtained using MRA technique without contrast. COMPARISON:  None. Head CT 06/29/2016 FINDINGS: MRI HEAD FINDINGS Brain: There are multiple small foci of diffusion restriction scattered throughout the left hemisphere, within the left frontal and parietal lobes. There is a single focus of diffusion restriction within the peripheral left occipital lobe (series 3, image 25). There is no evidence of acute hemorrhage. There is no midline shift or other mass effect. There is diffuse confluent hyperintense T2-weighted signal within the periventricular white matter, most often seen in the setting of chronic microvascular ischemia. No mass lesion or midline shift. No hydrocephalus or extra-axial fluid collection. The midline structures are normal. There is severe volume loss. The Vascular: Major intracranial arterial and venous sinus flow voids are preserved. No evidence of chronic microhemorrhage or amyloid angiopathy. Skull and upper cervical spine: The visualized skull base, calvarium, upper cervical spine and extracranial soft tissues are normal. Sinuses/Orbits: No fluid levels or advanced mucosal thickening. No mastoid  effusion. Normal orbits. MRA HEAD FINDINGS Intracranial internal carotid arteries: There is approximately 50% narrowing of the petrous segments of the right internal carotid artery. The remainder the visualized right ICA is normal. There is severe narrowing of the distal petrous/lacerum segments of the left internal carotid artery. The remainder of the left ICA is normal. Anterior cerebral arteries: Normal. Middle cerebral arteries: Normal. Posterior communicating arteries: Present bilaterally, but diminutive on the right. Posterior cerebral arteries: The left posterior cerebral artery has a fetal origin. The right PCA is normal. Basilar artery: Normal. Vertebral arteries: The vertebral system is left dominant. There is no flow related enhancement seen within the right V3 segment or proximal V4 segment. Enhancement the distal V4 segment just proximal to the basilar confluence may be secondary to retrograde or collateral flow. Superior cerebellar arteries: Normal. Anterior inferior cerebellar arteries: Not clearly seen, which is not uncommon. Posterior inferior cerebellar arteries: Normal. IMPRESSION: 1. Multiple small foci of acute ischemia within the left hemisphere, predominantly within the left MCA distribution. There is a single focus in the left occipital lobe, which is in the PCA distribution. The appearance is consistent with an embolic source. Note that there is a fetal origin of the left PCA, so the left ICA could serve as an embolic source for the left MCA and PCA distributions. 2. Severe narrowing of the left internal carotid artery distal petrous and proximal lacerum segments. No emergent large vessel occlusion. 3. Mild stenosis of the right internal carotid artery petrous segment. 4. Lack of flow related enhancement within the V3 segment of the right vertebral artery, which is likely occluded proximally. Enhancement within the right V4 segment is likely secondary to collateral filling. 5. No hemorrhage  or mass effect. Electronically Signed   By: Ulyses Jarred M.D.   On: 06/29/2016 15:46   Mr Brain Wo Contrast  Result Date: 06/29/2016 CLINICAL DATA:  Right-sided weakness for 3 weeks. Recurrent falls and aphasia. EXAM: MRI HEAD WITHOUT CONTRAST MRA HEAD WITHOUT CONTRAST TECHNIQUE: Multiplanar, multiecho pulse sequences of the brain and surrounding structures were obtained without intravenous contrast. Angiographic images of the head were obtained using MRA technique without contrast. COMPARISON:  None. Head CT 06/29/2016 FINDINGS: MRI HEAD FINDINGS Brain: There are multiple small foci of diffusion restriction scattered throughout the left hemisphere, within the left frontal and parietal lobes. There is a single focus of diffusion restriction within the peripheral left occipital lobe (series 3, image 25). There is no evidence of acute hemorrhage. There is no midline shift or other mass effect. There is diffuse confluent hyperintense T2-weighted signal within the periventricular white matter, most often seen in the setting of chronic microvascular ischemia. No mass lesion or midline shift. No hydrocephalus or extra-axial fluid collection. The midline structures are normal. There is severe volume loss. The Vascular: Major intracranial arterial and venous sinus flow voids are preserved. No evidence of chronic microhemorrhage or amyloid angiopathy. Skull and upper cervical spine: The visualized skull base, calvarium, upper cervical spine and extracranial soft tissues are normal. Sinuses/Orbits: No fluid levels or advanced mucosal thickening. No mastoid effusion. Normal orbits. MRA HEAD FINDINGS Intracranial internal carotid arteries: There is approximately 50% narrowing of the petrous segments of the right internal carotid artery. The remainder the visualized right ICA is normal. There is severe narrowing of the distal petrous/lacerum segments of the left internal carotid artery. The remainder of the left ICA is  normal. Anterior cerebral arteries: Normal. Middle cerebral arteries: Normal. Posterior communicating arteries: Present bilaterally, but diminutive on the right. Posterior cerebral arteries: The left posterior cerebral artery has a fetal origin. The right PCA is normal. Basilar artery: Normal. Vertebral arteries: The vertebral system is left dominant. There is no flow related enhancement seen within the right V3 segment or proximal V4 segment. Enhancement the distal V4 segment just proximal to the basilar confluence may be secondary to retrograde or collateral flow. Superior cerebellar arteries: Normal. Anterior inferior cerebellar arteries: Not clearly seen, which is not uncommon. Posterior inferior cerebellar arteries: Normal. IMPRESSION: 1. Multiple small foci of acute ischemia within the left hemisphere, predominantly within the left MCA distribution. There is a single focus in the left occipital lobe, which is in the PCA distribution. The appearance is consistent with an embolic source. Note that there is a fetal origin of the left PCA, so the left ICA could serve as an embolic source for the left MCA and PCA distributions. 2. Severe narrowing of the left internal carotid artery distal petrous and proximal lacerum segments. No emergent large vessel occlusion. 3. Mild stenosis of the right internal carotid artery petrous segment. 4. Lack of flow related enhancement within the V3 segment of the right vertebral artery, which is likely occluded proximally. Enhancement within the right V4 segment is likely secondary to collateral filling. 5. No hemorrhage or mass effect. Electronically Signed   By: Ulyses Jarred M.D.   On: 06/29/2016 15:46    ASSESSMENT AND PLAN:     1. Sinus Bradycardia -No EKG available for review. Telemetry shows sinus rhythm with rate mostly in 60s. Intermittently dropping to high 30s. Frequent PACs and PVC. No arrhythmia noted. He is on metoprolol 12.5 mg twice a day. - Patient has a  severe baseline dementia and frequent fall. Echocardiogram pending. Now admitting with acute CVA in setting of high restenosis left ICA. Also has a rhabdomyolysis. -  His not a good candidate for  event monitor or any procedure. Stop BB. Continue to monitor on telemetry.     Principal Problem:   Rhabdomyolysis Active Problems:   Hypertension   PVD (peripheral vascular disease) ([place])   Diabetes mellitus ([place])   History of right-sided carotid endarterectomy 2015   Dementia without behavioral disturbance   Leukocytosis   Stroke-like symptoms   Acromioclavicular (AC) joint injury, right, (chronic separation)   BPH (benign prostatic hyperplasia)   HLD (hyperlipidemia)   PAC (premature atrial contraction)   Cerebral embolism with cerebral infarction   Signed: Bhagat,Bhavinkumar, PA 06/30/2016, 1:38 PM Pager [phone]  Co-Sign MD  Agree with note by Lisabeth Pick  We are asked to see Zachary [name] by the primary service for evaluation of question bradycardia. I agree with the assessment and plan of Vin Bhagat PA-C. Zachary [name] is a 83 year old gentleman with dementia, hypertension, diabetes and prior stroke. He has no prior cardiac history. He was admitted after an unwitnessed fall. Workup has demonstrated stroke in the left MCA territory with disease in his left internal carotid petrous portion and external cranial ICA. His telemetry shows sinus rhythm with sinus arrhythmia and PACs. His exam otherwise is benign other than bilateral carotid bruits. He does have mild dementia as does his wife. His daughter is present during the interview. At Zachary point, I do not see any rhythm abnormalities that would contribute to his event. Likewise, I do not think he is a candidate for either a monitor or a pacemaker. He is alert his beta blocker which can be discontinued. No further workup is indicated at Zachary time. We will see again as needed.  Lorretta Harp, M.D., [place], [facility], [place] [address] [place], [place]  [address]  [phone] 06/30/2016 2:22 PM

## 2016-06-30 NOTE — Progress Notes (Signed)
  Echocardiogram 2D Echocardiogram has been performed.  Jennette Dubin 06/30/2016, 12:57 PM

## 2016-06-30 NOTE — Progress Notes (Signed)
CRITICAL VALUE ALERT  Critical value received:  Lactici acid 2.0  Date of notification:   3/14  Time of notification:  4270 Critical value read back:Yes.    Nurse who received alert:  Glenford Bayley   MD notified (1st page):Bhandari  Time of first page: 0800  MD notified (2nd page):  Time of second page:  Responding MD: Carolin Sicks   Time MD responded: [phone]

## 2016-06-30 NOTE — Progress Notes (Signed)
PHARMACY - PHYSICIAN COMMUNICATION CRITICAL VALUE ALERT - BLOOD CULTURE IDENTIFICATION (BCID)  Results for orders placed or performed during the hospital encounter of 06/29/16  Blood Culture ID Panel (Reflexed) (Collected: 06/29/2016  2:24 PM)  Result Value Ref Range   Enterococcus species NOT DETECTED NOT DETECTED   Listeria monocytogenes NOT DETECTED NOT DETECTED   Staphylococcus species NOT DETECTED NOT DETECTED   Staphylococcus aureus NOT DETECTED NOT DETECTED   Streptococcus species NOT DETECTED NOT DETECTED   Streptococcus agalactiae NOT DETECTED NOT DETECTED   Streptococcus pneumoniae NOT DETECTED NOT DETECTED   Streptococcus pyogenes NOT DETECTED NOT DETECTED   Acinetobacter baumannii NOT DETECTED NOT DETECTED   Enterobacteriaceae species NOT DETECTED NOT DETECTED   Enterobacter cloacae complex NOT DETECTED NOT DETECTED   Escherichia coli NOT DETECTED NOT DETECTED   Klebsiella oxytoca NOT DETECTED NOT DETECTED   Klebsiella pneumoniae NOT DETECTED NOT DETECTED   Proteus species NOT DETECTED NOT DETECTED   Serratia marcescens NOT DETECTED NOT DETECTED   Haemophilus influenzae NOT DETECTED NOT DETECTED   Neisseria meningitidis NOT DETECTED NOT DETECTED   Pseudomonas aeruginosa NOT DETECTED NOT DETECTED   Candida albicans NOT DETECTED NOT DETECTED   Candida glabrata NOT DETECTED NOT DETECTED   Candida krusei NOT DETECTED NOT DETECTED   Candida parapsilosis NOT DETECTED NOT DETECTED   Candida tropicalis NOT DETECTED NOT DETECTED    Name of physician (or Provider) Contacted: Bhandari  Changes to prescribed antibiotics required: Vancomycin initiated for now, will f/u culture growth in AM.  [place] Crane, Vena [place], BCPS  Clinical Pharmacist Pager [phone]  06/30/2016 5:56 PM

## 2016-06-30 NOTE — Progress Notes (Signed)
STROKE TEAM PROGRESS NOTE   HISTORY OF PRESENT ILLNESS (per record) Zachary [name] is an 83 y.o. male who presented to the ED after his aide noted his right side was weak. Concern for stroke.Patient denies any falls but states he has been weak on that arm for 3 days and awoke in the AM this way. He is a very poor historian. Of note he has had a stroke in the past affecting his right side along with a left CEA. HE also has had a AC separation with chronic right shoulder weakness. Documented LKW 06/27/2016, time unknown. Patient was not administered IV t-PA secondary to being out of the window. He was admitted for further evaluation and treatment.   SUBJECTIVE (INTERVAL HISTORY) Daughter is at bedside. Pt right side weakness much improved, currently only has mild pronator drift at right UE, right LE near normal. CTA showed left ICA 80% stenosis, will need VVS consult.    OBJECTIVE Temp:  [98 F (36.7 C)-98.4 F (36.9 C)] 98.4 F (36.9 C) (03/13 2101) Pulse Rate:  [37-92] 79 (03/14 0600) Cardiac Rhythm: Sinus bradycardia (03/14 1135) Resp:  [16-18] 18 (03/13 2101) BP: (104-195)/(45-98) 152/50 (03/14 0854) SpO2:  [97 %-98 %] 98 % (03/13 2101) Weight:  [88 kg (194 lb)] 88 kg (194 lb) (03/13 1755)  CBC:  Recent Labs Lab 06/29/16 1132 06/29/16 1143 06/30/16 0608  WBC 22.0*  --  14.9*  NEUTROABS 16.5*  --  9.5*  HGB 16.3 16.3 14.0  HCT 46.4 48.0 40.5  MCV 92.1  --  91.4  PLT 277  --  093    Basic Metabolic Panel:  Recent Labs Lab 06/29/16 1132 06/29/16 1143 06/29/16 1413 06/30/16 0608  NA 139 142  --  140  K 3.8 3.8  --  3.3*  CL 102 105  --  106  CO2 22  --   --  21*  GLUCOSE 152* 156*  --  122*  BUN 20 25*  --  21*  CREATININE 1.13 1.00  --  1.07  CALCIUM 9.1  --   --  8.4*  MG  --   --  1.8  --     Lipid Panel:    Component Value Date/Time   CHOL 147 06/30/2016 0608   TRIG 113 06/30/2016 0608   HDL 25 (L) 06/30/2016 0608   CHOLHDL 5.9 06/30/2016 0608   VLDL 23  06/30/2016 0608   LDLCALC 99 06/30/2016 0608   HgbA1c:  Lab Results  Component Value Date   HGBA1C 6.6 (H) 08/05/2015   Urine Drug Screen:    Component Value Date/Time   LABOPIA NONE DETECTED 06/29/2016 1649   COCAINSCRNUR NONE DETECTED 06/29/2016 1649   LABBENZ NONE DETECTED 06/29/2016 1649   AMPHETMU NONE DETECTED 06/29/2016 1649   THCU NONE DETECTED 06/29/2016 1649   LABBARB NONE DETECTED 06/29/2016 1649      IMAGING I have personally reviewed the radiological images below and agree with the radiology interpretations.  Ct Head Wo Contrast 06/29/2016 1. Stable atrophy and moderate small vessel ischemic change. No acute intracranial abnormality. 2. No calvarial fracture is noted.   Ct Angio Head W Or Wo Contrast Ct Angio Neck W Or Wo Contrast 06/29/2016 1. No emergent intracranial large vessel occlusion. 2. Severe stenosis of the proximal left internal carotid artery, measuring approximately 80% by NASCET criteria. 3. Severe stenosis of the left internal carotid artery distal petrous segment and approximately 50% stenosis of the cavernous segment of the left ICA and  distal petrous segment of the right ICA. 4. Chronic occlusion of the right vertebral artery. 5. Bilateral common carotid artery stenosis of approximately 50%. 6. Aortic atherosclerosis and biapical emphysema.   Mr Brain 49 Contrast Mr Jodene Nam Head Wo Contrast 06/29/2016 1. Multiple small foci of acute ischemia within the left hemisphere, predominantly within the left MCA distribution. There is a single focus in the left occipital lobe, which is in the PCA distribution. The appearance is consistent with an embolic source. Note that there is a fetal origin of the left PCA, so the left ICA could serve as an embolic source for the left MCA and PCA distributions. 2. Severe narrowing of the left internal carotid artery distal petrous and proximal lacerum segments. No emergent large vessel occlusion. 3. Mild stenosis of the right  internal carotid artery petrous segment. 4. Lack of flow related enhancement within the V3 segment of the right vertebral artery, which is likely occluded proximally. Enhancement within the right V4 segment is likely secondary to collateral filling. 5. No hemorrhage or mass effect.   TTE - Left ventricle: The cavity size was normal. Wall thickness was   normal. Systolic function was normal. The estimated ejection   fraction was in the range of 55% to 60%. Wall motion was normal;   there were no regional wall motion abnormalities. - Aortic valve: Mildly to moderately calcified annulus. Mildly   calcified leaflets. - Left atrium: The atrium was mildly dilated.   PHYSICAL EXAM  Temp:  [97.9 F (36.6 C)-98.4 F (36.9 C)] 97.9 F (36.6 C) (03/14 1508) Pulse Rate:  [37-92] 62 (03/14 1508) Resp:  [16-18] 16 (03/14 1508) BP: (104-195)/(45-75) 136/50 (03/14 1508) SpO2:  [93 %-98 %] 93 % (03/14 1508)  General - Well nourished, well developed, in no apparent distress.  Ophthalmologic - Fundi not visualized due to noncooperation.  Cardiovascular - Regular rate and rhythm.  Mental Status -  Level of arousal and orientation to place, and person were intact, however, not orientated to time. Language including expression, naming, repetition, comprehension was assessed and found intact, mild dysarthria  Cranial Nerves II - XII - II - Visual field intact OU. III, IV, VI - Extraocular movements intact. V - Facial sensation intact bilaterally. VII - Facial movement intact bilaterally. VIII - Hearing & vestibular intact bilaterally. X - Palate elevates symmetrically. XI - Chin turning & shoulder shrug intact bilaterally. XII - Tongue protrusion intact.  Motor Strength - The patient's strength was normal in all extremities except right pronator drift was present.  Bulk was normal and fasciculations were absent.   Motor Tone - Muscle tone was assessed at the neck and appendages and was  normal.  Reflexes - The patient's reflexes were 1+ in all extremities and he had no pathological reflexes.  Sensory - Light touch, temperature/pinprick were assessed and were symmetrical.    Coordination - The patient had normal movements in the hands and feet with no ataxia or dysmetria.  Tremor was absent.  Gait and Station - deferred to PT   ASSESSMENT/PLAN Mr. ANTUAN LIMES is a 83 y.o. male with history of stroke in 2015 with resultant L HP d/t s/p R CEA, demential, BPH with urinary retention, HTN, ? DM, chronic leukocytosis, PCA, HLD presenting with rhabdo after multiple falls with R sided weakness. He did not receive IV t-PA due to delay in arrival.   Stroke:  Multiple L MCA/ACA, MCA/PCA watershed territory infarcts and one L occpital infarct in setting fetal origin PCA,  infarcts likely due to L ICA large vessel source  Resultant  Right pronator drift, mild  CT head no acute abnormality  CTA head and neck no ELVO. Severe L ICA 80% stenosis. L ICA 50% stenosis. R VA chronic occlusion. B CCA 50% stenosis. Aortic atherosclerosis and biapical emphysema  MRI / MRA head Multiple small L MCA distribution infarcts and one L occipital lobe infarct (fetal origin L PCA) severe narrowing L ICA. No ELVO. Mild stenosis R ICA. No flow R V3.  2D Echo  EF 55-60%  Recommend VVS consultation for symptomatic left ICA high grade stenosis  LDL 99  HgbA1c pending  Lovenox 40 mg sq daily for VTE prophylaxis  Diet heart healthy/carb modified Room service appropriate? Yes; Fluid consistency: Thin  No antiplatelet prior to admission, now on ASA 325mg  and clopidogrel 75 mg daily. Continue DAPT for 3 months and then plavix alone.  Patient counseled to be compliant with his antithrombotic medications  Ongoing aggressive stroke risk factor management  Therapy recommendations:  SNF  Disposition:  pending   Carotid stenosis  Right CEA 05/2013  Followed by VVS as outpt, serial CUS showed  progressive left ICA stenosis - last CUS 12/2015 with left 40-59% stenosis  This admission CTA showed left proximal ICA 80% stenosis  B/l CCA 50% stenosis  Recommend VVS consultation for left symptomatic ICA stenosis  Hx of stroke  05/2013 - right ACA infarct - LDL 128. A1C 6.8 - put on ASA - s/p right CEA  Leukocytosis  Follows with Dr. Burr Medico at hematology  WBC 22.0 -> 14.9  Continue outpt follow up with Dr. Burr Medico  Hypertension  Stable Permissive hypertension (OK if < 220/120) but gradually normalize in 5-7 days Long-term BP goal normotensive  Hyperlipidemia  Home meds:  lipitor 10, resumed in hospital  LDL 99, goal < 70  Continue statin at discharge  Possible Diabetes  HgbA1c pending, goal < 7.0  SSI  CBG monitoring  Other Stroke Risk Factors  Advanced age  Former Cigarette smoker quit 75 years ago  Family hx stroke (father)  PVD  Other Active Problems  Baseline dementia without behavioral disturbance - on namenda   Acromioclavicular joint injury, right  BPH with urinary retention  Bradycardia, 30-40s. Cardiology consult. Not a good candidate for event monitor or any procedure. Stop beta blocker.  Gait disability - follows with Dr. Carles Collet at [facility].   Hospital day # 1  Rosalin Hawking, MD PhD Stroke Neurology 06/30/2016 6:35 PM   To contact Stroke Continuity provider, please refer to [email or web address] After hours, contact General Neurology

## 2016-06-30 NOTE — Consult Note (Signed)
Consultation Note Date: 06/30/2016   Patient Name: Zachary [name]  DOB: 1922-03-02  MRN: [id number]  Age / Sex: 83 y.o., male  PCP: Lottie Dawson, MD Referring Physician: Rosita Fire, MD  Reason for Consultation: Establishing goals of care  HPI/Patient Profile: 83 y.o. male  with past medical history of CVA affecting the L side in 2015, R carotid stenosis, mild dementia, BPH, HTN, DM, chronic leukocytosis, PVD, dyslipidemia admitted on 06/29/2016 with fall.  Workup reveals rhabdomyolysis and stroke. Palliative medicine consulted for [place].    Clinical Assessment and Goals of Care: Patient was in bed sleeping at time of consult. Daughter, [name] at bedside. They are familiar with palliative and Hospice services due to her sister dying at age 54 and her Grandmother receiving hospice services in her parents home.   Patient awoke briefly during consult. He was oriented to person, place and able to tell me he had a stroke. He likes living in his home, in [place], although, he recalls that we recently got "six" inches of snow and he had "salad" for breakfast. (we did have a small snow event, and he did not have salad for breakfast). He has good quality of life. He has Advance Directives in place. The patient's son in law, Ludwig Clarks is [place]. [name] is in contact with [facility]. Patient does not want artificial life prolonging measures. No code. No tube feeding. No dialysis. His goals are to focus on quality, not quantity, and to stay home with his wife, who has dementia. As [name] puts it, they both plan to leave their home "feet first".  [name] tells me her parents have lived at home independently despite her Mom's advance dementia for quite some time. They have a caregiver who comes to their home most every day from 8am to 3pm. Patient's wife will not let the caregiver cook (there is some  paranoia about using the stove, instead she keeps cereal in it), but she provides food in other ways.     Primary Decision Maker HCPOA- Manuela Schwartz    SUMMARY OF RECOMMENDATIONS -DNR -Continue current level of care -Treat what is treatable, do not escalate care -D/C home with caretaker if possible, family understands patient may need SNF for rehab d/t stroke but this isn't ideal for quality of life -Refer for outpatient palliative follow up in community    Code Status/Advance Care Planning:  DNR  Palliative Prophylaxis:   Turn Reposition  Additional Recommendations (Limitations, Scope, Preferences):  No Artificial Feeding and No Hemodialysis  Prognosis:    Unable to determine  Discharge Planning: [place] for rehab with Palliative care service follow-up  Primary Diagnoses: Present on Admission: . PVD (peripheral vascular disease) ([place]) . Rhabdomyolysis . Stroke-like symptoms . (Resolved) Dysarthria . Hypertension . (Resolved) Bradycardia . Dementia without behavioral disturbance . Leukocytosis . BPH (benign prostatic hyperplasia) . HLD (hyperlipidemia) . PAC (premature atrial contraction)   I have reviewed the medical record, interviewed the patient and family, and examined the patient. The  following aspects are pertinent.  Past Medical History:  Diagnosis Date  . Arthritis   . Carotid artery occlusion   . GERD (gastroesophageal reflux disease)   . Headache    "sometimes monthly" (06/29/2016)  . Heart block   . History of blood transfusion    "while in the service"  . History of stomach ulcers   . Hypertension   . Insomnia   . Pancreatitis   . Prostate cancer ([place])    Archie Endo 09/02/2010  . Prostate enlargement   . PVC's (premature ventricular contractions)   . PVD (peripheral vascular disease) ([place])   . Stroke ([name]) 05/2013; 06/29/2016   Archie Endo 05/24/2013  . Type II diabetes mellitus ([place]) dx'd 2008   Archie Endo 08/19/2010   Social  History   Social History  . Marital status: Married    Spouse name: N/A  . Number of children: N/A  . Years of education: N/A   Occupational History  . retired     [place] History Main Topics  . Smoking status: Former Smoker    Packs/day: 3.00    Years: 20.00    Quit date: 04/19/1962  . Smokeless tobacco: Never Used  . Alcohol use No  . Drug use: No  . Sexual activity: Not Currently   Other Topics Concern  . None   Social History Narrative  . None   Family History  Problem Relation Age of Onset  . Breast cancer Daughter   . Cancer Daughter 41    died of cancer   . Stroke Father   . Other      hypogonadism  . Heart attack Neg Hx   . Hypertension Neg Hx    Scheduled Meds: . aspirin  300 mg Rectal Daily   Or  . aspirin  325 mg Oral Daily  . atorvastatin  10 mg Oral Daily  . clopidogrel  75 mg Oral Daily  . enoxaparin (LOVENOX) injection  40 mg Subcutaneous Q24H  . insulin aspart  0-5 Units Subcutaneous QHS  . insulin aspart  0-9 Units Subcutaneous TID WC  . memantine  28 mg Oral Daily  . pantoprazole  40 mg Oral Daily  . pentoxifylline  400 mg Oral TID WC  . potassium chloride  20 mEq Oral Daily  . sertraline  50 mg Oral Daily  . tamsulosin  0.4 mg Oral Daily   Continuous Infusions: . 0.9 % NaCl with KCl 20 mEq / L 100 mL/hr at 06/30/16 1343   PRN Meds:.acetaminophen **OR** acetaminophen (TYLENOL) oral liquid 160 mg/5 mL **OR** acetaminophen, fluticasone Medications Prior to Admission:  Prior to Admission medications   Medication Sig Start Date End Date Taking? Authorizing Provider  atorvastatin (LIPITOR) 10 MG tablet Take 1 tablet (10 mg total) by mouth daily. 05/30/13  Yes Marius Ditch, MD  ergocalciferol (VITAMIN D2) 50000 UNITS capsule Take 50,000 Units by mouth once a week. Friday   Yes Historical Provider, MD  fluticasone (FLONASE) 50 MCG/ACT nasal spray Place 1 spray into both nostrils daily as needed for allergies or rhinitis.    Yes Historical Provider, MD  glimepiride (AMARYL) 4 MG tablet Take 4 mg by mouth daily with breakfast.   Yes Historical Provider, MD  Memantine HCl ER (NAMENDA XR) 28 MG CP24 Take 28 mg by mouth daily.    Yes Historical Provider, MD  metFORMIN (GLUCOPHAGE) 500 MG tablet Take 500 mg by mouth 2 (two) times daily with a meal.   Yes Historical  Provider, MD  metoprolol tartrate (LOPRESSOR) 25 MG tablet Take 0.5 tablets (12.5 mg total) by mouth 2 (two) times daily. 05/25/13  Yes Marius Ditch, MD  Multiple Vitamin (MULTIVITAMIN WITH MINERALS) TABS Take 1 tablet by mouth daily.   Yes Historical Provider, MD  omeprazole (PRILOSEC) 20 MG capsule Take 20 mg by mouth daily.   Yes Historical Provider, MD  pentoxifylline (TRENTAL) 400 MG CR tablet Take 400 mg by mouth 3 (three) times daily with meals.   Yes Historical Provider, MD  sertraline (ZOLOFT) 50 MG tablet Take 50 mg by mouth daily.   Yes Historical Provider, MD  Tamsulosin HCl (FLOMAX) 0.4 MG CAPS Take 0.4 mg by mouth daily.    Yes Historical Provider, MD  zolpidem (AMBIEN) 5 MG tablet Take 5 mg by mouth at bedtime as needed for sleep.   Yes Historical Provider, MD   Allergies  Allergen Reactions  . Penicillins Rash    Has patient had a PCN reaction causing immediate rash, facial/tongue/throat swelling, SOB or lightheadedness with hypotension: No Has patient had a PCN reaction causing severe rash involving mucus membranes or skin necrosis: No Has patient had a PCN reaction that required hospitalization No Has patient had a PCN reaction occurring within the last 10 years: No If all of the above answers are "NO", then may proceed with Cephalosporin use.   Review of Systems  All other systems reviewed and are negative.   Physical Exam  Constitutional: He is oriented to person, place, and time. He appears well-developed and well-nourished.  Cardiovascular: Normal rate and regular rhythm.   Pulmonary/Chest: Effort normal and breath sounds normal.   Abdominal: Soft. Bowel sounds are normal.  Musculoskeletal:  R side weakness  Neurological: He is alert and oriented to person, place, and time.  Skin: Skin is warm and dry.  Psychiatric: He has a normal mood and affect. His behavior is normal.    Vital Signs: BP (!) 136/50 (BP Location: Left Arm)   Pulse 62   Temp 97.9 F (36.6 C) (Oral)   Resp 16   Ht 5\' 11"  (1.803 m)   Wt 88 kg (194 lb)   SpO2 93%   BMI 27.06 kg/m  Pain Assessment: No/denies pain   Pain Score: Asleep   SpO2: SpO2: 93 % O2 Device:SpO2: 93 % O2 Flow Rate: .   IO: Intake/output summary:  Intake/Output Summary (Last 24 hours) at 06/30/16 1536 Last data filed at 06/30/16 1532  Gross per 24 hour  Intake           324.17 ml  Output                0 ml  Net           324.17 ml    LBM: Last BM Date: 06/29/16 Baseline Weight: Weight: 86.2 kg (190 lb) Most recent weight: Weight: 88 kg (194 lb)     Palliative Assessment/Data: PPS: 60%     Thank you for this consult. Palliative medicine will continue to follow and assist as needed.    Time Total:75 minutes Greater than 50%  of this time was spent counseling and coordinating care related to the above assessment and plan.  Signed by: Mariana Kaufman, AGNP-C Palliative Medicine    Please contact Palliative Medicine Team phone at [phone] for questions and concerns.  For individual provider: See Shea Evans

## 2016-06-30 NOTE — Evaluation (Signed)
Occupational Therapy Evaluation Patient Details Name: Zachary [name] MRN: [id number] DOB: [birth year] Today's Date: 06/30/2016    History of Present Illness 83 y.o. male with medical history significant for CVA affecting left side in 2015 in the setting of right carotid stenosis, status post right carotid endarterectomy in 2015, dementia, BPH with history of urinary retention, hypertension, ? Diabetes although last hemoglobin A1c 2015 was 6.6, chronic leukocytosis, peripheral vascular disease on medical therapy, dyslipidemia.  Patient had an unwitnessed fall, slurred speech and weakness on right arm; pt found to have rhabdomyolysis.  MRI of the brain consistent with multiple small foci of acute ischemia within the left hemisphere predominantly within the left MCA distribution   Clinical Impression   Per daughter, pt required assist from home aide with ADL PTA but was mod I for toilet transfers and household mobility. Currently pt mod assist +2 for functional mobility and mod-max assist for ADL. Pt presenting with cognitive deficits, R sided weakness/impaired coordination, impaired sitting/standing balance, and decreased activity tolerance impacting his independence and safety with ADL and functional mobility. Recommending SNF for follow up to maximize independence and safety with ADL and functional mobility. Pt would benefit from continued skilled OT to address established goals.    Follow Up Recommendations  SNF;Supervision/Assistance - 24 hour    Equipment Recommendations  Other (comment) (TBD at next venue)    Recommendations for Other Services       Precautions / Restrictions Precautions Precautions: Fall Restrictions Weight Bearing Restrictions: No      Mobility Bed Mobility Overal bed mobility: Needs Assistance Bed Mobility: Supine to Sit;Sit to Supine     Supine to sit: Mod assist;+2 for safety/equipment Sit to supine: Min guard   General bed mobility comments: assist for  trunk upright and scooting to EOB, cues for weight shifting to EOB  Transfers Overall transfer level: Needs assistance Equipment used: Rolling walker (2 wheeled) Transfers: Sit to/from Stand Sit to Stand: Min assist;+2 safety/equipment         General transfer comment: verbal cues for safe technique, cues for hand placement, pt attempting to stand and return to sitting despite cues to wait for therapists (lines/etc)    Balance Overall balance assessment: Needs assistance;History of Falls Sitting-balance support: Feet supported;Bilateral upper extremity supported Sitting balance-Leahy Scale: Fair Sitting balance - Comments: min-mod assist for sitting balance. L lateral lean Postural control: Left lateral lean Standing balance support: Bilateral upper extremity supported Standing balance-Leahy Scale: Poor Standing balance comment: requires UE support and external assist with challenges                            ADL Overall ADL's : Needs assistance/impaired Eating/Feeding: Minimal assistance;Bed level   Grooming: Moderate assistance;Sitting   Upper Body Bathing: Moderate assistance;Sitting   Lower Body Bathing: Maximal assistance;Sit to/from stand   Upper Body Dressing : Minimal assistance;Sitting   Lower Body Dressing: Maximal assistance;Sit to/from stand   Toilet Transfer: Moderate assistance;+2 for physical assistance;Ambulation;RW;Cueing for safety;Cueing for sequencing Toilet Transfer Details (indicate cue type and reason): simulated by sit to stand from EOB with functional mobility in room         Functional mobility during ADLs: Moderate assistance;+2 for physical assistance;Rolling walker;Cueing for safety;Cueing for sequencing General ADL Comments: Max cues throughout for sequencing and safety; pt impulsive.     Vision   Additional Comments: Needs further assessment.     Perception     Praxis  Pertinent Vitals/Pain Pain Assessment:  Faces Faces Pain Scale: No hurt Pain Intervention(s): Monitored during session     Hand Dominance Right   Extremity/Trunk Assessment Upper Extremity Assessment Upper Extremity Assessment: RUE deficits/detail RUE Deficits / Details: Grossly 4/5 with impaired fine/gross motor coordination. Good grip strength. RUE Coordination: decreased fine motor;decreased gross motor   Lower Extremity Assessment Lower Extremity Assessment: Defer to PT evaluation RLE Deficits / Details: not able to hold MMT, ?cognition, able to perform AROM, poor coordination RLE Coordination: decreased gross motor LLE Deficits / Details: not able to hold MMT, ?cognition, able to perform AROM, poor coordination LLE Coordination: decreased gross motor   Cervical / Trunk Assessment Cervical / Trunk Assessment: Kyphotic   Communication Communication Communication: No difficulties   Cognition Arousal/Alertness: Awake/alert Behavior During Therapy: Impulsive Overall Cognitive Status: History of cognitive impairments - at baseline                 General Comments: hx dementia, requires safety cues, impulsive   General Comments       Exercises       Shoulder Instructions      Home Living Family/patient expects to be discharged to:: Private residence Living Arrangements: Spouse/significant other Available Help at Discharge: Family;Personal care attendant Type of Home: House Home Access: Stairs to enter [employer] of Steps: 2-3 Entrance Stairs-Rails: Can reach both Home Layout: Two level;Able to live on main level with bedroom/bathroom;1/2 bath on main level               Home Equipment: Walker - 2 wheels;Cane - single point          Prior Functioning/Environment Level of Independence: Needs assistance  Gait / Transfers Assistance Needed: mod I/supervision with SPC or RW houshold distances ADL's / Homemaking Assistance Needed: assistance from personal care aide with bathing and  dressing. able to perform toilet transfers without assist PTA   Comments: pt also assists with care for spouse        OT Problem List: Decreased strength;Decreased range of motion;Decreased activity tolerance;Impaired balance (sitting and/or standing);Decreased coordination;Decreased safety awareness;Decreased cognition;Decreased knowledge of use of DME or AE;Impaired sensation;Impaired UE functional use      OT Treatment/Interventions: Self-care/ADL training;Therapeutic exercise;Neuromuscular education;Energy conservation;DME and/or AE instruction;Therapeutic activities;Patient/family education;Balance training    OT Goals(Current goals can be found in the care plan section) Acute Rehab OT Goals Patient Stated Goal: daughter would like pt to go to rehab OT Goal Formulation: With patient/family Time For Goal Achievement: 07/14/16 Potential to Achieve Goals: Good ADL Goals Pt Will Perform Eating: with set-up;sitting;with adaptive utensils Pt Will Perform Grooming: with min guard assist;standing Pt Will Transfer to Toilet: with min guard assist;ambulating;regular height toilet Pt Will Perform Toileting - Clothing Manipulation and hygiene: with min guard assist;sit to/from stand Additional ADL Goal #1: Pt will demonstrate sustained attention x5 minutes during ADL with min cues.  OT Frequency: Min 3X/week   Barriers to D/C: Decreased caregiver support  wife has hx of dementia       Co-evaluation PT/OT/SLP Co-Evaluation/Treatment: Yes Reason for Co-Treatment: For patient/therapist safety;To address functional/ADL transfers;Complexity of the patient's impairments (multi-system involvement) PT goals addressed during session: Mobility/safety with mobility OT goals addressed during session: ADL's and self-care      End of Session Equipment Utilized During Treatment: Gait belt;Rolling walker  Activity Tolerance: Patient tolerated treatment well Patient left: in bed;with call bell/phone  within reach;with bed alarm set;with family/visitor present  OT Visit Diagnosis: Hemiplegia and hemiparesis Hemiplegia - Right/Left:  Right Hemiplegia - dominant/non-dominant: Dominant Hemiplegia - caused by: Unspecified                ADL either performed or assessed with clinical judgement  Time: 1520-1540 OT Time Calculation (min): 20 min Charges:  OT General Charges $OT Visit: 1 Procedure OT Evaluation $OT Eval Moderate Complexity: 1 Procedure G-Codes:     Jizel Cheeks A. Ulice Brilliant, M.S., OTR/L Pager: [place] 06/30/2016, 4:18 PM

## 2016-06-30 NOTE — Progress Notes (Signed)
Patient agitated and confused, pulling at IV lines, condom catheter. Paged NP on call for recommendations. [place]: Increasing agitation and confused.  We have put mitts on. He is Q2 neuros. Is there anything we can give him? Thank you.  Awaiting call back.

## 2016-06-30 NOTE — Progress Notes (Signed)
Trental CR can not be crushed. Pharmacy contacted said they do not have other variations of medication MD made aware and verbal order to hold while pts medications are to be crushed per SLP

## 2016-07-01 DIAGNOSIS — F0390 Unspecified dementia without behavioral disturbance: Secondary | ICD-10-CM

## 2016-07-01 DIAGNOSIS — I63132 Cerebral infarction due to embolism of left carotid artery: Secondary | ICD-10-CM

## 2016-07-01 DIAGNOSIS — E7800 Pure hypercholesterolemia, unspecified: Secondary | ICD-10-CM

## 2016-07-01 DIAGNOSIS — I10 Essential (primary) hypertension: Secondary | ICD-10-CM

## 2016-07-01 DIAGNOSIS — Z7189 Other specified counseling: Secondary | ICD-10-CM

## 2016-07-01 DIAGNOSIS — I6522 Occlusion and stenosis of left carotid artery: Secondary | ICD-10-CM

## 2016-07-01 DIAGNOSIS — R001 Bradycardia, unspecified: Secondary | ICD-10-CM

## 2016-07-01 DIAGNOSIS — M6282 Rhabdomyolysis: Secondary | ICD-10-CM

## 2016-07-01 DIAGNOSIS — Z515 Encounter for palliative care: Secondary | ICD-10-CM

## 2016-07-01 LAB — BASIC METABOLIC PANEL
Anion gap: 9 (ref 5–15)
BUN: 15 mg/dL (ref 6–20)
CO2: 22 mmol/L (ref 22–32)
Calcium: 8.5 mg/dL — ABNORMAL LOW (ref 8.9–10.3)
Chloride: 110 mmol/L (ref 101–111)
Creatinine, Ser: 0.97 mg/dL (ref 0.61–1.24)
GFR calc Af Amer: >60 mL/min (ref >60)
GFR calc non Af Amer: >60 mL/min (ref >60)
Glucose, Bld: 111 mg/dL — ABNORMAL HIGH (ref 65–99)
Potassium: 3.7 mmol/L (ref 3.5–5.1)
Sodium: 141 mmol/L (ref 135–145)

## 2016-07-01 LAB — CULTURE, BLOOD (ROUTINE X 2)

## 2016-07-01 LAB — HEMOGLOBIN A1C
Hgb A1c MFr Bld: 9.5 % — ABNORMAL HIGH (ref 4.8–5.6)
Mean Plasma Glucose: 226 mg/dL

## 2016-07-01 LAB — GLUCOSE, CAPILLARY
GLUCOSE-CAPILLARY: 133 mg/dL — AB (ref 65–99)
GLUCOSE-CAPILLARY: 118 mg/dL — AB (ref 65–99)
Glucose-Capillary: 111 mg/dL — ABNORMAL HIGH (ref 65–99)
Glucose-Capillary: 119 mg/dL — ABNORMAL HIGH (ref 65–99)

## 2016-07-01 LAB — PROCALCITONIN: Procalcitonin: <0.1 ng/mL

## 2016-07-01 LAB — CK: Total CK: 1243 U/L — ABNORMAL HIGH (ref 49–397)

## 2016-07-01 NOTE — Clinical Social Work Note (Signed)
Clinical Social Work Assessment  Patient Details  Name: Zachary [name] MRN: [id number] Date of Birth: 1921/09/16  Date of referral:  07/01/16               Reason for consult:  Facility Placement                Permission sought to share information with:  Facility Sport and exercise psychologist, Family Supports Permission granted to share information::  Yes, Verbal Permission Granted  Name::     Zachary [name]/Zachary [name]  Agency::  SNFs  Relationship::  Daughter/Son in Financial trader Information:  [phone]  Housing/Transportation Living arrangements for the past 2 months:  Single Family Home Source of Information:  Adult Children Patient Interpreter Needed:  None Criminal Activity/Legal Involvement Pertinent to Current Situation/Hospitalization:  No - Comment as needed Significant Relationships:  Adult Children, Other Family Members, Spouse Lives with:  Spouse Do you feel safe going back to the place where you live?  No Need for family participation in patient care:  Yes (Comment)  Care giving concerns:  CSW received consult for possible SNF placement at time of discharge. CSW spoke with patient's daughter, Zachary [name], regarding PT recommendation of SNF placement at time of discharge. She states that patient's son-in-law, Zachary [name], is Economist but lives in [place]. Zachary [name] reported that patient's spouse is currently unable to care for patient at their home given  patient's current physical needs and fall risk. Patient's daughter and Zachary [name] expressed understanding of PT recommendation and is agreeable to SNF placement at time of discharge. CSW to continue to follow and assist with discharge planning needs.   Social Worker assessment / plan:  CSW spoke with patient's family concerning possibility of rehab at [facility] before returning home.  Employment status:  Retired Forensic scientist:  Medicare PT Recommendations:  [place] / Referral to community resources:  [place]  Patient/Family's Response to care:  Patient's family recognizes need for rehab before returning home and is agreeable to a SNF in [place]. Patient's family reported preference for [place] or [place].   Patient/Family's Understanding of and Emotional Response to Diagnosis, Current Treatment, and Prognosis:  Patient/family is realistic regarding therapy needs and expressed being hopeful for SNF placement. Patient's sister expressed understanding of CSW role and discharge process. No questions/concerns about plan or treatment.    Emotional Assessment Appearance:  Appears stated age Attitude/Demeanor/Rapport:  Unable to Assess Affect (typically observed):  Unable to Assess Orientation:  Oriented to Self, Oriented to Place Alcohol / Substance use:  Not Applicable Psych involvement (Current and /or in the community):  No (Comment)  Discharge Needs  Concerns to be addressed:  Care Coordination Readmission within the last 30 days:  No Current discharge risk:  Physical Impairment Barriers to Discharge:  Continued Medical Work up   [employer], [place] 07/01/2016, 4:26 PM

## 2016-07-01 NOTE — Progress Notes (Signed)
Daily Progress Note   Patient Name: Zachary [name]       Date: 07/01/2016 DOB: 05-Mar-1922  Age: 83 y.o. MRN#: [id number] Attending Physician: Rosita Fire, MD Primary Care Physician: [name], MD Admit Date: 06/29/2016  Reason for Consultation/Follow-up: Establishing goals of care  Subjective: Patient sitting up in bed. His wife, caregiver and daughter at bedside. Daughter [name] tells me they are meeting with Dr. [name] to discuss possible surgery for left ICA stenosis. Given his advanced age and goals of care, we did discuss if that would be in his best interest, but they really do want to hear what Dr. Luther Parody opinion on the subject is.  Noted blood cultures positive overnight and patient started on IV vancomycin, however, clinically patient appears to be doing quite well. Patient walked with PT from the bed to the door this morning as well.  Patient very oriented this morning. His goal is to return home. He is quite adamant that he does not want to go to SNF or rehab.   ROS  Length of Stay: 2  Current Medications: Scheduled Meds:  . aspirin  300 mg Rectal Daily   Or  . aspirin  325 mg Oral Daily  . atorvastatin  10 mg Oral Daily  . clopidogrel  75 mg Oral Daily  . enoxaparin (LOVENOX) injection  40 mg Subcutaneous Q24H  . insulin aspart  0-5 Units Subcutaneous QHS  . insulin aspart  0-9 Units Subcutaneous TID WC  . memantine  28 mg Oral Daily  . pantoprazole  40 mg Oral Daily  . pentoxifylline  400 mg Oral TID WC  . potassium chloride  20 mEq Oral Daily  . sertraline  50 mg Oral Daily  . tamsulosin  0.4 mg Oral Daily    Continuous Infusions: . 0.9 % NaCl with KCl 20 mEq / L 100 mL/hr at 07/01/16 0306    PRN Meds: acetaminophen **OR** acetaminophen  (TYLENOL) oral liquid 160 mg/5 mL **OR** acetaminophen, fluticasone  Physical Exam  Constitutional: He is oriented to person, place, and time.  HENT:  Head: Normocephalic and atraumatic.  Cardiovascular: Normal heart sounds and intact distal pulses.   Pulmonary/Chest: Effort normal and breath sounds normal.  Neurological: He is alert and oriented to person, place, and time.  Skin: Skin is warm and  dry.  Psychiatric: He has a normal mood and affect. His behavior is normal.            Vital Signs: BP (!) 158/53 (BP Location: Left Arm)   Pulse 66   Temp 97.9 F (36.6 C)   Resp 18   Ht 5\' 11"  (1.803 m)   Wt 88 kg (194 lb)   SpO2 95%   BMI 27.06 kg/m  SpO2: SpO2: 95 % O2 Device: O2 Device: Not Delivered O2 Flow Rate:    Intake/output summary:  Intake/Output Summary (Last 24 hours) at 07/01/16 1243 Last data filed at 07/01/16 1036  Gross per 24 hour  Intake          1531.67 ml  Output             1000 ml  Net           531.67 ml   LBM: Last BM Date: 06/29/16 Baseline Weight: Weight: 86.2 kg (190 lb) Most recent weight: Weight: 88 kg (194 lb)       Palliative Assessment/Data: PPS: 50%    Flowsheet Rows     Most Recent Value  Intake Tab  Referral Department  Hospitalist  Unit at Time of Referral  Med/Surg Unit  Palliative Care Primary Diagnosis  Neurology  Date Notified  06/30/16  Palliative Care Type  New Palliative care  Reason for referral  Clarify Goals of Care  Date of Admission  06/29/16  Date first seen by Palliative Care  06/30/16  # of days Palliative referral response time  0 Day(s)  # of days IP prior to Palliative referral  1  Clinical Assessment  Psychosocial & Spiritual Assessment  Palliative Care Outcomes      Patient Active Problem List   Diagnosis Date Noted  . Goals of care, counseling/discussion   . Advance care planning   . Palliative care by specialist   . Stenosis of left carotid artery   . Rhabdomyolysis 06/29/2016  . Stroke-like  symptoms 06/29/2016  . Acromioclavicular [facility]) joint injury, right, (chronic separation) 06/29/2016  . BPH (benign prostatic hyperplasia) 06/29/2016  . HLD (hyperlipidemia) 06/29/2016  . PAC (premature atrial contraction) 06/29/2016  . Cerebral embolism with cerebral infarction 06/29/2016  . Diabetes mellitus with complication ([place])   . Aortic sclerosis 09/18/2015  . Fatigue 09/18/2015  . Pre-syncope 08/05/2015  . Leukocytosis 08/04/2015  . Syncope 08/04/2015  . Hypogonadism male 08/28/2014  . Diarrhea 08/28/2014  . Screening for prostate cancer 07/30/2014  . Dementia without behavioral disturbance 07/04/2013  . Aftercare following surgery of the circulatory system, [place] 06/19/2013  . CVA (cerebral vascular accident) ([place]) 06/04/2013  . Urinary retention 05/29/2013  . Carotid artery disease ([place]) 05/28/2013  . History of right-sided carotid endarterectomy 2015 05/28/2013  . Pulsus bigeminis 05/20/2013  . Diabetes mellitus ([place]) 05/20/2013  . Weakness 05/20/2013  . Acute ischemic stroke ([place]) 05/20/2013  . Postop check 08/06/2011  . Acute cholecystitis 07/19/2011  . Hypertension 07/15/2011  . PVD (peripheral vascular disease) [facility])     Palliative Care Assessment & Plan   Patient Profile: 83 y.o. male  with past medical history of CVA affecting the L side in 2015, R carotid stenosis, mild dementia, BPH, HTN, DM, chronic leukocytosis, PVD, dyslipidemia admitted on 06/29/2016 with fall.  Workup reveals rhabdomyolysis and stroke. Palliative medicine consulted for [place].  Assessment/Recommendations/Plan   Patient really does not want to go to SNF- he had high functional status and much support at home  Recommend PT  reconsult for placement recommendations, he is much improved today  Family would like to consult with Dr. [name] re: vascular intervention  Recommend palliative service f/u in community wherever patient is discharged be it home or SNF   Goals of Care and Additional  Recommendations:  Limitations on Scope of Treatment: No Artificial Feeding  Code Status:  DNR  Prognosis:   Unable to determine  Discharge Planning:  To Be Determined  Care plan was discussed with daughter, [name] and patient.  Thank you for allowing the Palliative Medicine Team to assist in the care of this patient.   Total Time: 30 minutes  Greater than 50%  of this time was spent counseling and coordinating care related to the above assessment and plan.  Mariana Kaufman, AGNP-C Palliative Medicine   Please contact Palliative Medicine Team phone at [phone] for questions and concerns.

## 2016-07-01 NOTE — Consult Note (Signed)
[facility] [place]  [name] name: Zachary [name] MRN: [id number] DOB: [birth year] Sex: male   REFERRING PROVIDER:    Dr. Erlinda Hong   REASON FOR CONSULT:    Left brain stroke Left carotid stenosis  HISTORY OF PRESENT ILLNESS:   Zachary [name] is a 83 y.o. male, who Presented to [facility] emergency department on 06/29/2016 status post an unwitnessed fall with reported slurred speech and right arm weakness.  Zachary [name] lives at home with his wife.  They have a caregiver that works with them 6 days a week, however she had not been by for 24 hours because of weather issues.  Zachary [name] was not given TPA because of Zachary timing.  Initial head CT was negative.  He did have a MRI which shows multiple small foci of acute ischemic infarcts on Zachary left.  Zachary [name] has a history of a right brain stroke and is status post right carotid endarterectomy by Dr. early in 2015.  He does suffer from early dementia which is mild he is a former smoker and he is on a statin for hypercholesterolemia.  He is medically managed for hypertension.  He is currently on dual antiplatelet therapy with aspirin and Plavix  PAST MEDICAL HISTORY    Past Medical History:  Diagnosis Date  . Arthritis   . Carotid artery occlusion   . GERD (gastroesophageal reflux disease)   . Headache    "sometimes monthly" (06/29/2016)  . Heart block   . History of blood transfusion    "while in [facility] service"  . History of stomach ulcers   . Hypertension   . Insomnia   . Pancreatitis   . Prostate cancer ([place])    Archie Endo 09/02/2010  . Prostate enlargement   . PVC's (premature ventricular contractions)   . PVD (peripheral vascular disease) ([place])   . Stroke ([place]) 05/2013; 06/29/2016   Archie Endo 05/24/2013  . Type II diabetes mellitus ([place]) dx'd 2008   Archie Endo 08/19/2010     FAMILY HISTORY   Family History  Problem Relation Age of Onset  . Breast cancer Daughter   . Cancer Daughter 59     died of cancer   . Stroke Father   . Other      hypogonadism  . Heart attack Neg Hx   . Hypertension Neg Hx     SOCIAL HISTORY:   Social History   Social History  . Marital status: Married    Spouse name: N/A  . Number of children: N/A  . Years of education: N/A   Occupational History  . retired     [place] History Main Topics  . Smoking status: Former Smoker    Packs/day: 3.00    Years: 20.00    Quit date: 04/19/1962  . Smokeless tobacco: Never Used  . Alcohol use No  . Drug use: No  . Sexual activity: Not Currently   Other Topics Concern  . Not on file   Social History Narrative  . No narrative on file    ALLERGIES:    Allergies  Allergen Reactions  . Penicillins Rash    Has [name] had a PCN reaction causing immediate rash, facial/tongue/throat swelling, SOB or lightheadedness with hypotension: No Has [name] had a PCN reaction causing severe rash involving mucus membranes or skin necrosis: No Has [name] had a PCN reaction that required hospitalization No Has [name] had a PCN reaction occurring within Zachary last 10 years: No If  all of Zachary above answers are "NO", then may proceed with Cephalosporin use.    CURRENT MEDICATIONS:    Current Facility-Administered Medications  Medication Dose Route Frequency Provider Last Rate Last Dose  . 0.9 % NaCl with KCl 20 mEq/ L  infusion   Intravenous Continuous Dron Tanna Furry, MD 100 mL/hr at 07/01/16 0306    . acetaminophen (TYLENOL) tablet 650 mg  650 mg Oral Q4H PRN Samella Parr, NP   650 mg at 06/29/16 2210   Or  . acetaminophen (TYLENOL) solution 650 mg  650 mg Per Tube Q4H PRN Samella Parr, NP       Or  . acetaminophen (TYLENOL) suppository 650 mg  650 mg Rectal Q4H PRN Samella Parr, NP      . aspirin suppository 300 mg  300 mg Rectal Daily Samella Parr, NP       Or  . aspirin tablet 325 mg  325 mg Oral Daily Samella Parr, NP   325 mg at 07/01/16 0855  .  atorvastatin (LIPITOR) tablet 10 mg  10 mg Oral Daily Samella Parr, NP   10 mg at 07/01/16 0856  . clopidogrel (PLAVIX) tablet 75 mg  75 mg Oral Daily Rosalin Hawking, MD   75 mg at 07/01/16 0855  . enoxaparin (LOVENOX) injection 40 mg  40 mg Subcutaneous Q24H Samella Parr, NP   40 mg at 06/29/16 2018  . fluticasone (FLONASE) 50 MCG/ACT nasal spray 1 spray  1 spray Each Nare Daily PRN Samella Parr, NP      . insulin aspart (novoLOG) injection 0-5 Units  0-5 Units Subcutaneous QHS Samella Parr, NP      . insulin aspart (novoLOG) injection 0-9 Units  0-9 Units Subcutaneous TID WC Samella Parr, NP   1 Units at 06/30/16 1736  . memantine (NAMENDA XR) 24 hr capsule 28 mg  28 mg Oral Daily Samella Parr, NP   28 mg at 07/01/16 0853  . pantoprazole (PROTONIX) EC tablet 40 mg  40 mg Oral Daily Samella Parr, NP   40 mg at 07/01/16 0856  . pentoxifylline (TRENTAL) CR tablet 400 mg  400 mg Oral TID WC Samella Parr, NP   400 mg at 07/01/16 0854  . potassium chloride 20 MEQ/15ML (10%) solution 20 mEq  20 mEq Oral Daily Dron Tanna Furry, MD   20 mEq at 07/01/16 0857  . sertraline (ZOLOFT) tablet 50 mg  50 mg Oral Daily Samella Parr, NP   50 mg at 07/01/16 0857  . tamsulosin (FLOMAX) capsule 0.4 mg  0.4 mg Oral Daily Samella Parr, NP   0.4 mg at 07/01/16 1950    REVIEW OF SYSTEMS:   [X]  denotes positive finding, [ ]  denotes negative finding Cardiac  Comments:  Chest pain or chest pressure:    Shortness of breath upon exertion:    Short of breath when lying flat:    Irregular heart rhythm:        Vascular    Pain in calf, thigh, or hip brought on by ambulation:    Pain in feet at night that wakes you up from your sleep:     Blood clot in your veins:    Leg swelling:         Pulmonary    Oxygen at home:    Productive cough:     Wheezing:         Neurologic  Sudden weakness in arms or legs:  x   Sudden numbness in arms or legs:  x   Sudden onset of difficulty speaking  or slurred speech: x   Temporary loss of vision in one eye:     Problems with dizziness:         Gastrointestinal    Blood in stool:      Vomited blood:         Genitourinary    Burning when urinating:     Blood in urine:        Psychiatric    Major depression:         Hematologic    Bleeding problems:    Problems with blood clotting too easily:        Skin    Rashes or ulcers:        Constitutional    Fever or chills:     PHYSICAL EXAM:   Vitals:   06/30/16 2145 06/30/16 2217 07/01/16 0435 07/01/16 0529  BP: (!) 179/61 (!) 143/82  (!) 158/53  Pulse: 72   66  Resp: 18   18  Temp: 97.5 F (36.4 C)   97.9 F (36.6 C)  TempSrc: Oral     SpO2: 95%  96% 95%  Weight:      Height:        GENERAL: Zachary [name] is a well-nourished male, in no acute distress. Zachary vital signs are documented above. CARDIAC: There is a regular rate and rhythm.  PULMONARY:  nonlabored respirations ABDOMEN: Soft and non-tender with normal pitched bowel sounds.  MUSCULOSKELETAL: There are no major deformities or cyanosis. NEUROLOGIC:   Good grip strength in Zachary right arm but slightly decreased from Zachary left.  He can raise both feet off Zachary bed.  Smile is symmetric.   SKIN: There are no ulcers or rashes noted. PSYCHIATRIC: Zachary [name] has a normal affect.  STUDIES:   I reviewed Zachary following studies: MRI of brain: 1. Multiple small foci of acute ischemia within Zachary left hemisphere, predominantly within Zachary left MCA distribution. There is a single focus in Zachary left occipital lobe, which is in Zachary PCA distribution. Zachary appearance is consistent with an embolic source. Note that there is a fetal origin of Zachary left PCA, so Zachary left ICA could serve as an embolic source for Zachary left MCA and PCA distributions. 2. Severe narrowing of Zachary left internal carotid artery distal petrous and proximal lacerum segments. No emergent large vessel occlusion. 3. Mild stenosis of Zachary right internal carotid  artery petrous segment. 4. Lack of flow related enhancement within Zachary V3 segment of Zachary right vertebral artery, which is likely occluded proximally. Enhancement within Zachary right V4 segment is likely secondary to collateral filling. 5. No hemorrhage or mass effect.  CT angiogram: 1. No emergent intracranial large vessel occlusion. 2. Severe stenosis of Zachary proximal left internal carotid artery, measuring approximately 80% by NASCET criteria. 3. Severe stenosis of Zachary left internal carotid artery distal petrous segment and approximately 50% stenosis of Zachary cavernous segment of Zachary left ICA and distal petrous segment of Zachary right ICA. 4. 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 5. Bilateral common carotid artery stenosis of approximately 50%. 6. Aortic atherosclerosis and biapical emphysema.  ASSESSMENT and PLAN   Symptomatic left carotid stenosis: I had a lengthy discussion with Zachary [name], his wife and caregiver, and his daughter.  This is obviously a tricky situation given Zachary [name]'s age and comorbidities in addition to  [facility] symptomatically left carotid stenosis.  We discussed Zachary pros and cons of medical treatment versus surgical intervention.  Currently after discussing this with Dr. early who has operated on him in Zachary past we are favoring inpatient rehabilitation to see how he recovers and if he makes a good recovery planning for left carotid endarterectomy at a later date.  Dr. early is planning on talking with Zachary family tomorrow morning to confirm our plans.   Annamarie Major, MD [facility] [phone] Pager [phone]

## 2016-07-01 NOTE — Progress Notes (Signed)
STROKE TEAM PROGRESS NOTE   SUBJECTIVE (INTERVAL HISTORY) Zachary [name] is at bedside. Overnight no acute issues. [facility] consulted and favor rehab first and then left CEA later. Dr. [name] will talk with family in am.    OBJECTIVE Temp:  [97.5 F (36.4 C)-97.9 F (36.6 C)] 97.9 F (36.6 C) (03/15 0529) Pulse Rate:  [66-72] 66 (03/15 0529) Cardiac Rhythm: Normal sinus rhythm;Heart block (03/15 0705) Resp:  [18] 18 (03/15 0529) BP: (143-179)/(53-82) 158/53 (03/15 0529) SpO2:  [95 %-96 %] 95 % (03/15 0529)  CBC:   Recent Labs Lab 06/29/16 1132 06/29/16 1143 06/30/16 0608  WBC 22.0*  --  14.9*  NEUTROABS 16.5*  --  9.5*  HGB 16.3 16.3 14.0  HCT 46.4 48.0 40.5  MCV 92.1  --  91.4  PLT 277  --  027    Basic Metabolic Panel:   Recent Labs Lab 06/29/16 1413 06/30/16 0608 07/01/16 0416  NA  --  140 141  K  --  3.3* 3.7  CL  --  106 110  CO2  --  21* 22  GLUCOSE  --  122* 111*  BUN  --  21* 15  CREATININE  --  1.07 0.97  CALCIUM  --  8.4* 8.5*  MG 1.8  --   --     Lipid Panel:     Component Value Date/Time   CHOL 147 06/30/2016 0608   TRIG 113 06/30/2016 0608   HDL 25 (L) 06/30/2016 0608   CHOLHDL 5.9 06/30/2016 0608   VLDL 23 06/30/2016 0608   LDLCALC 99 06/30/2016 0608   HgbA1c:  Lab Results  Component Value Date   HGBA1C 9.5 (H) 06/30/2016   Urine Drug Screen:     Component Value Date/Time   LABOPIA NONE DETECTED 06/29/2016 1649   COCAINSCRNUR NONE DETECTED 06/29/2016 1649   LABBENZ NONE DETECTED 06/29/2016 1649   AMPHETMU NONE DETECTED 06/29/2016 1649   THCU NONE DETECTED 06/29/2016 1649   LABBARB NONE DETECTED 06/29/2016 1649      IMAGING I have personally reviewed the radiological images below and agree with the radiology interpretations.  Ct Head Wo Contrast 06/29/2016 1. Stable atrophy and moderate small vessel ischemic change. No acute intracranial abnormality. 2. No calvarial fracture is noted.   Ct Angio Head W Or Wo Contrast Ct Angio Neck W  Or Wo Contrast 06/29/2016 1. No emergent intracranial large vessel occlusion. 2. Severe stenosis of the proximal left internal carotid artery, measuring approximately 80% by NASCET criteria. 3. Severe stenosis of the left internal carotid artery distal petrous segment and approximately 50% stenosis of the cavernous segment of the left ICA and distal petrous segment of the right ICA. 4. Chronic occlusion of the right vertebral artery. 5. Bilateral common carotid artery stenosis of approximately 50%. 6. Aortic atherosclerosis and biapical emphysema.   Mr Brain 53 Contrast Mr Jodene Nam Head Wo Contrast 06/29/2016 1. Multiple small foci of acute ischemia within the left hemisphere, predominantly within the left MCA distribution. There is a single focus in the left occipital lobe, which is in the PCA distribution. The appearance is consistent with an embolic source. Note that there is a fetal origin of the left PCA, so the left ICA could serve as an embolic source for the left MCA and PCA distributions. 2. Severe narrowing of the left internal carotid artery distal petrous and proximal lacerum segments. No emergent large vessel occlusion. 3. Mild stenosis of the right internal carotid artery petrous segment. 4. Lack of flow related  enhancement within the V3 segment of the right vertebral artery, which is likely occluded proximally. Enhancement within the right V4 segment is likely secondary to collateral filling. 5. No hemorrhage or mass effect.   TTE - Left ventricle: The cavity size was normal. Wall thickness was   normal. Systolic function was normal. The estimated ejection   fraction was in the range of 55% to 60%. Wall motion was normal;   there were no regional wall motion abnormalities. - Aortic valve: Mildly to moderately calcified annulus. Mildly   calcified leaflets. - Left atrium: The atrium was mildly dilated.   PHYSICAL EXAM  Temp:  [97.5 F (36.4 C)-97.9 F (36.6 C)] 97.9 F (36.6 C) (03/15  0529) Pulse Rate:  [66-72] 66 (03/15 0529) Resp:  [18] 18 (03/15 0529) BP: (143-179)/(53-82) 158/53 (03/15 0529) SpO2:  [95 %-96 %] 95 % (03/15 0529)  General - Well nourished, well developed, in no apparent distress.  Ophthalmologic - Fundi not visualized due to noncooperation.  Cardiovascular - Regular rate and rhythm.  Mental Status -  Level of arousal and orientation to place, and person were intact, however, not orientated to time. Language including expression, naming, repetition, comprehension was assessed and found intact, mild dysarthria  Cranial Nerves II - XII - II - Visual field intact OU. III, IV, VI - Extraocular movements intact. V - Facial sensation intact bilaterally. VII - Facial movement intact bilaterally. VIII - Hearing & vestibular intact bilaterally. X - Palate elevates symmetrically. XI - Chin turning & shoulder shrug intact bilaterally. XII - Tongue protrusion intact.  Motor Strength - The patient's strength was normal in all extremities except right pronator drift was present.  Bulk was normal and fasciculations were absent.   Motor Tone - Muscle tone was assessed at the neck and appendages and was normal.  Reflexes - The patient's reflexes were 1+ in all extremities and he had no pathological reflexes.  Sensory - Light touch, temperature/pinprick were assessed and were symmetrical.    Coordination - The patient had normal movements in the hands and feet with no ataxia or dysmetria.  Tremor was absent.  Gait and Station - deferred to PT   ASSESSMENT/PLAN Zachary [name] is a 83 y.o. male with history of stroke in 2015 with resultant L HP d/t s/p R CEA, demential, BPH with urinary retention, HTN, ? DM, chronic leukocytosis, PCA, HLD presenting with rhabdo after multiple falls with R sided weakness. He did not receive IV t-PA due to delay in arrival.   Stroke:  Multiple L MCA/ACA, MCA/PCA watershed territory infarcts and one L occpital infarct in  setting fetal origin PCA, infarcts likely due to L ICA large vessel source  Resultant  Right pronator drift, mild  CT head no acute abnormality  CTA head and neck no ELVO. Severe L ICA 80% stenosis. L ICA 50% stenosis. R VA chronic occlusion. B CCA 50% stenosis. Aortic atherosclerosis and biapical emphysema  MRI / MRA head Multiple small L MCA distribution infarcts and one L occipital lobe infarct (fetal origin L PCA) severe narrowing L ICA. No ELVO. Mild stenosis R ICA. No flow R V3.  2D Echo  EF 55-60%  [facility]VS consulted and recommend rehab first and left CEA at later date.  LDL 99  HgbA1c 9.5  Lovenox 40 mg sq daily for VTE prophylaxis Diet heart healthy/carb modified Room service appropriate? Yes; Fluid consistency: Thin  No antiplatelet prior to admission, now on ASA 325mg  and clopidogrel 75 mg daily. Continue  DAPT for 3 months and then plavix alone.  Patient counseled to be compliant with his antithrombotic medications  Ongoing aggressive stroke risk factor management  Therapy recommendations:  SNF  Disposition:  pending   Carotid stenosis  Right CEA 05/2013  Followed by [facility] as outpt, serial CUS showed progressive left ICA stenosis - last CUS 12/2015 with left 40-59% stenosis  This admission CTA showed left proximal ICA 80% stenosis  B/l CCA 50% stenosis  VVS consulted and recommend rehab first and left CEA at later date.  Hx of stroke  05/2013 - right ACA infarct - LDL 128. A1C 6.8 - put on ASA - s/p right CEA  Leukocytosis  Follows with Dr. Burr Medico at hematology  WBC 22.0 -> 14.9  Continue outpt follow up with Dr. Burr Medico  Hypertension  Stable Permissive hypertension (OK if < 220/120) but gradually normalize in 5-7 days Long-term BP goal normotensive  Hyperlipidemia  Home meds:  lipitor 10, resumed in hospital  LDL 99, goal < 70  Continue statin at discharge  Possible Diabetes  HgbA1c 9.5, goal < 7.0  uncontrolled  SSI  CBG monitoring  Need  close PCP follow up and DM education  Other Stroke Risk Factors  Advanced age  Former Cigarette smoker quit 47 years ago  Family hx stroke (father)  PVD  Other Active Problems  Baseline dementia without behavioral disturbance - on namenda   Acromioclavicular joint injury, right  BPH with urinary retention  Bradycardia, 30-40s. Cardiology consult. Not a good candidate for event monitor or any procedure. Stop beta blocker.  Gait disability - follows with Dr. Carles Collet at [facility].   Hospital day # 2  Neurology will sign off. Please call with questions. Pt will follow up with Dr. Carles Collet at [facility] in about 4-6 weeks. Thanks for the consult.   Rosalin Hawking, MD PhD Stroke Neurology 07/01/2016 7:23 PM   To contact Stroke Continuity provider, please refer to [email or web address] After hours, contact General Neurology

## 2016-07-01 NOTE — Progress Notes (Signed)
PROGRESS NOTE    Zachary [name]  OYD:[id number] DOB: [birth year] DOA: 06/29/2016 PCP: [name], MD   Brief Narrative: 83 y.o. male with medical history significant for CVA affecting left side in 2015 in the setting of right carotid stenosis, status post right carotid endarterectomy in 2015, dementia, BPH with history of urinary retention, hypertension, ? Diabetes although last hemoglobin A1c 2015 was 6.6, chronic leukocytosis evaluated by hematology in the outpatient setting, peripheral vascular disease on medical therapy, dyslipidemia.Patient had an unwitnessed fall, slurred speech and weakness on right arm.  Patient was found to have rhabdomyolysis with CK level of 4388 on admission. MRI of the brain consistent with multiple small foci of acute ischemia within the left hemisphere predominantly within the left MCA distribution. Neurology is following.  Assessment & Plan:  # Acute ischemic stroke at left MCA distribution: - Stroke neurology is following. Vascular consult service evaluated the patient for symptomatic left ICA stenosis. Follow up for further recommendation and plan. -Echocardiogram with EF of 55-60% with normal wall motion -Patient has LDL of 99, A1c of 6.6 -Patient is on aspirin, Plavix, Lipitor -PT, OT and social worker evaluation possible rehabilitation discharge -Continue supportive care  #rhabdomyolysis due to fall ( traumatic); -CK level trending down with IV fluid. Serum creatinine level stable. Continue to monitor labs. CK of 1243 today  # Hypertension:  Monitor blood pressure. Dc metoprolo because of bradycardia.  #Type 2 diabetes: A1c 6.6. Continue insulin sliding scale. Monitor blood sugar level.  #Chronic leukocytosis: WBC count trending down. No sign of infection. Reportedly patient follows up with hematologist outpatient. Cultures negative.  #Bradycardia: Evaluated by cardiologist. Discontinued metoprolol.  #Goals of care discussion: Discussed  with the patient's wife and daughter at bedside. Patient is DO NOT RESUSCITATE. Palliative care consult requested.  Discussed with the social worker, case Freight forwarder and nursing staff in the morning.  Principal Problem:   Rhabdomyolysis Active Problems:   Hypertension   PVD (peripheral vascular disease) ([place])   Diabetes mellitus ([place])   History of right-sided carotid endarterectomy 2015   Dementia without behavioral disturbance   Leukocytosis   Stroke-like symptoms   Acromioclavicular (AC) joint injury, right, (chronic separation)   BPH (benign prostatic hyperplasia)   HLD (hyperlipidemia)   PAC (premature atrial contraction)   Cerebral embolism with cerebral infarction   Stenosis of left carotid artery   Goals of care, counseling/discussion   Advance care planning   Palliative care by specialist  DVT prophylaxis: Lovenox subcutaneous Code Status: DO NOT RESUSCITATE Family Communication: I discussed with the patient's daughter and wife at bedside Disposition Plan: Likely discharge to SNF in 1-2 days. Discussed with the social worker    Consultants:   Neurologist  Cardiologist  Palliative care  Procedures: None Antimicrobials: None Subjective: Patient was seen and examined at bedside. Patient reported doing well. Denied headache, dizziness, nausea, vomiting, chest pain or shortness of breath. Objective: Vitals:   06/30/16 2145 06/30/16 2217 07/01/16 0435 07/01/16 0529  BP: (!) 179/61 (!) 143/82  (!) 158/53  Pulse: 72   66  Resp: 18   18  Temp: 97.5 F (36.4 C)   97.9 F (36.6 C)  TempSrc: Oral     SpO2: 95%  96% 95%  Weight:      Height:        Intake/Output Summary (Last 24 hours) at 07/01/16 1528 Last data filed at 07/01/16 1036  Gross per 24 hour  Intake  1531.67 ml  Output             1000 ml  Net           531.67 ml   Filed Weights   06/29/16 1107 06/29/16 1755  Weight: 86.2 kg (190 lb) 88 kg (194 lb)    Examination:  General exam:  Elderly male lying on bed comfortable Respiratory system: Clear to auscultation. Respiratory effort normal. No wheezing or crackle Cardiovascular system: S1 & S2 heard, RRR.  No pedal edema. Gastrointestinal system: Abdomen is nondistended, soft and nontender. Normal bowel sounds heard. Central nervous system: Alert awake and following commands.  Extremities: Muscular strength 5 over 5 in all extremities Skin: No rashes, lesions or ulcers Psychiatry: Judgement and insight appear impaired     Data Reviewed: I have personally reviewed following labs and imaging studies  CBC:  Recent Labs Lab 06/29/16 1132 06/29/16 1143 06/30/16 0608  WBC 22.0*  --  14.9*  NEUTROABS 16.5*  --  9.5*  HGB 16.3 16.3 14.0  HCT 46.4 48.0 40.5  MCV 92.1  --  91.4  PLT 277  --  182   Basic Metabolic Panel:  Recent Labs Lab 06/29/16 1132 06/29/16 1143 06/29/16 1413 06/30/16 0608 07/01/16 0416  NA 139 142  --  140 141  K 3.8 3.8  --  3.3* 3.7  CL 102 105  --  106 110  CO2 22  --   --  21* 22  GLUCOSE 152* 156*  --  122* 111*  BUN 20 25*  --  21* 15  CREATININE 1.13 1.00  --  1.07 0.97  CALCIUM 9.1  --   --  8.4* 8.5*  MG  --   --  1.8  --   --    GFR: Estimated Creatinine Clearance: 51.8 mL/min (by C-G formula based on SCr of 0.97 mg/dL). Liver Function Tests:  Recent Labs Lab 06/29/16 1132 06/30/16 0608  AST 108* 94*  ALT 30 31  ALKPHOS 60 50  BILITOT 1.5* 1.4*  PROT 6.8 5.8*  ALBUMIN 3.7 3.0*   No results for input(s): LIPASE, AMYLASE in the last 168 hours. No results for input(s): AMMONIA in the last 168 hours. Coagulation Profile:  Recent Labs Lab 06/29/16 1132  INR 1.10   Cardiac Enzymes:  Recent Labs Lab 06/29/16 1132 06/30/16 0608 07/01/16 0416  CKTOTAL 4,388* 2,444* 1,243*   BNP (last 3 results) No results for input(s): PROBNP in the last 8760 hours. HbA1C:  Recent Labs  06/30/16 0608  HGBA1C 9.5*   CBG:  Recent Labs Lab 06/30/16 0757  06/30/16 1722 06/30/16 2144 07/01/16 0755 07/01/16 1310  GLUCAP 112* 130* 153* 111* 119*   Lipid Profile:  Recent Labs  06/30/16 0608  CHOL 147  HDL 25*  LDLCALC 99  TRIG 113  CHOLHDL 5.9   Thyroid Function Tests: No results for input(s): TSH, T4TOTAL, FREET4, T3FREE, THYROIDAB in the last 72 hours. Anemia Panel: No results for input(s): VITAMINB12, FOLATE, FERRITIN, TIBC, IRON, RETICCTPCT in the last 72 hours. Sepsis Labs:  Recent Labs Lab 06/29/16 1413 06/29/16 1533 06/30/16 0608 07/01/16 0416  PROCALCITON <0.10  --   --  <0.10  LATICACIDVEN  --  2.2* 2.0*  --     Recent Results (from the past 240 hour(s))  Culture, blood (Routine X 2) w Reflex to ID Panel     Status: None (Preliminary result)   Collection Time: 06/29/16  2:20 PM  Result Value Ref Range Status  Specimen Description BLOOD RIGHT HAND  Final   Special Requests BOTTLES DRAWN AEROBIC ONLY  5CC  Final   Culture NO GROWTH 2 DAYS  Final   Report Status PENDING  Incomplete  Culture, blood (Routine X 2) w Reflex to ID Panel     Status: Abnormal   Collection Time: 06/29/16  2:24 PM  Result Value Ref Range Status   Specimen Description BLOOD LEFT ANTECUBITAL  Final   Special Requests IN PEDIATRIC BOTTLE  4CC  Final   Culture  Setup Time   Final    GRAM POSITIVE COCCI IN CLUSTERS IN PEDIATRIC BOTTLE Organism ID to follow CRITICAL RESULT CALLED TO, READ BACK BY AND VERIFIED WITH: J. Carney Pharm.D. 16:45 06/30/16 ([email or web address])    Culture (A)  Final    STAPHYLOCOCCUS SPECIES (COAGULASE NEGATIVE) THE SIGNIFICANCE OF ISOLATING THIS ORGANISM FROM A SINGLE SET OF BLOOD CULTURES WHEN MULTIPLE SETS ARE DRAWN IS UNCERTAIN. PLEASE NOTIFY THE MICROBIOLOGY DEPARTMENT WITHIN ONE WEEK IF SPECIATION AND SENSITIVITIES ARE REQUIRED.    Report Status 07/01/2016 FINAL  Final  Blood Culture ID Panel (Reflexed)     Status: None   Collection Time: 06/29/16  2:24 PM  Result Value Ref Range Status   Enterococcus species NOT  DETECTED NOT DETECTED Final   Listeria monocytogenes NOT DETECTED NOT DETECTED Final   Staphylococcus species NOT DETECTED NOT DETECTED Final   Staphylococcus aureus NOT DETECTED NOT DETECTED Final   Streptococcus species NOT DETECTED NOT DETECTED Final   Streptococcus agalactiae NOT DETECTED NOT DETECTED Final   Streptococcus pneumoniae NOT DETECTED NOT DETECTED Final   Streptococcus pyogenes NOT DETECTED NOT DETECTED Final   Acinetobacter baumannii NOT DETECTED NOT DETECTED Final   Enterobacteriaceae species NOT DETECTED NOT DETECTED Final   Enterobacter cloacae complex NOT DETECTED NOT DETECTED Final   Escherichia coli NOT DETECTED NOT DETECTED Final   Klebsiella oxytoca NOT DETECTED NOT DETECTED Final   Klebsiella pneumoniae NOT DETECTED NOT DETECTED Final   Proteus species NOT DETECTED NOT DETECTED Final   Serratia marcescens NOT DETECTED NOT DETECTED Final   Haemophilus influenzae NOT DETECTED NOT DETECTED Final   Neisseria meningitidis NOT DETECTED NOT DETECTED Final   Pseudomonas aeruginosa NOT DETECTED NOT DETECTED Final   Candida albicans NOT DETECTED NOT DETECTED Final   Candida glabrata NOT DETECTED NOT DETECTED Final   Candida krusei NOT DETECTED NOT DETECTED Final   Candida parapsilosis NOT DETECTED NOT DETECTED Final   Candida tropicalis NOT DETECTED NOT DETECTED Final         Radiology Studies: Ct Angio Head W Or Wo Contrast  Result Date: 06/29/2016 CLINICAL DATA:  Acute ischemia EXAM: CT ANGIOGRAPHY HEAD AND NECK TECHNIQUE: Multidetector CT imaging of the head and neck was performed using the standard protocol during bolus administration of intravenous contrast. Multiplanar CT image reconstructions and MIPs were obtained to evaluate the vascular anatomy. Carotid stenosis measurements (when applicable) are obtained utilizing NASCET criteria, using the distal internal carotid diameter as the denominator. CONTRAST:  15 mL Isovue 370 IV COMPARISON:  Brain MRI/MRA  06/29/2016 FINDINGS: CTA NECK FINDINGS Aortic arch: There is no aneurysm or dissection of the visualized ascending aorta or aortic arch. There is a normal 3 vessel branching pattern. There is atherosclerotic calcification within the proximal subclavian arteries without significant stenosis. Calcific aortic atherosclerosis. Right carotid system: There is mixed calcified and noncalcified atherosclerotic plaque of the common carotid artery resulting in approximately 50% stenosis. There is noncalcified plaque within the right internal carotid artery at  the C3-C4 level the results in approximately 50% narrowing of the lumen. Left carotid system: There is mixed calcified and noncalcified plaque within the left common carotid artery without greater than 50% stenosis. There is mixed calcified and noncalcified plaque at the left carotid bifurcation resulting in approximately 80% stenosis. Vertebral arteries: There is atherosclerotic calcification at the vertebral artery origins bilaterally. The left vertebral artery is occluded proximally in shows no contrast enhancement along its V2 segment. Enhancement of the right V3 and V4 segments of likely retrograde. There is no hemodynamically significant stenosis of the dominant left vertebral artery. Skeleton: There is multilevel uncovertebral and facet hypertrophy preserved Ing and severe foraminal stenosis at left C3-4, right C4-5 and mild-to-moderate stenosis at the other cervical levels. Other neck: The nasopharynx is clear. The oropharynx and hypopharynx are normal. The epiglottis is normal. The supraglottic larynx, glottis and subglottic larynx are normal. No retropharyngeal collection. The parapharyngeal spaces are preserved. The parotid and submandibular glands are normal. No sialolithiasis or salivary ductal dilatation. The thyroid gland is normal. There is no cervical lymphadenopathy. Upper chest: There is biapical emphysema. Review of the MIP images confirms the above  findings CTA HEAD FINDINGS Anterior circulation: --Intracranial internal carotid arteries: There is noncalcified plaque within the distal petrous segment of the right ICA and mixed calcified and noncalcified plaque within the cavernous and clinoid segments. There is approximately 50% luminal narrowing of the cavernous segment. On the left, there is severe narrowing of the distal petrous segment. There is mixed calcified and noncalcified plaque of the cavernous and clinoid segments without advanced stenosis. --Anterior cerebral arteries: Normal. --Middle cerebral arteries: Normal. --Posterior communicating arteries: Present on the left. Posterior circulation: --Posterior cerebral arteries: Fetal origin of the left posterior cerebral artery. Normal right PCA. --Superior cerebellar arteries: Normal. --Basilar artery: Normal. --Anterior inferior cerebellar arteries: Not visualized, which is not uncommon. --Posterior inferior cerebellar arteries: Normal. Venous sinuses: As permitted by contrast timing, patent. Anatomic variants: Fetal origin of the left PCA. Delayed phase: No parenchymal contrast enhancement. Review of the MIP images confirms the above findings IMPRESSION: 1. No emergent intracranial large vessel occlusion. 2. Severe stenosis of the proximal left internal carotid artery, measuring approximately 80% by NASCET criteria. 3. Severe stenosis of the left internal carotid artery distal petrous segment and approximately 50% stenosis of the cavernous segment of the left ICA and distal petrous segment of the right ICA. 4. Chronic occlusion of the right vertebral artery. 5. Bilateral common carotid artery stenosis of approximately 50%. 6. Aortic atherosclerosis and biapical emphysema. Electronically Signed   By: Ulyses Jarred M.D.   On: 06/29/2016 21:21   Ct Angio Neck W Or Wo Contrast  Result Date: 06/29/2016 CLINICAL DATA:  Acute ischemia EXAM: CT ANGIOGRAPHY HEAD AND NECK TECHNIQUE: Multidetector CT imaging  of the head and neck was performed using the standard protocol during bolus administration of intravenous contrast. Multiplanar CT image reconstructions and MIPs were obtained to evaluate the vascular anatomy. Carotid stenosis measurements (when applicable) are obtained utilizing NASCET criteria, using the distal internal carotid diameter as the denominator. CONTRAST:  15 mL Isovue 370 IV COMPARISON:  Brain MRI/MRA 06/29/2016 FINDINGS: CTA NECK FINDINGS Aortic arch: There is no aneurysm or dissection of the visualized ascending aorta or aortic arch. There is a normal 3 vessel branching pattern. There is atherosclerotic calcification within the proximal subclavian arteries without significant stenosis. Calcific aortic atherosclerosis. Right carotid system: There is mixed calcified and noncalcified atherosclerotic plaque of the common carotid artery resulting in  approximately 50% stenosis. There is noncalcified plaque within the right internal carotid artery at the C3-C4 level the results in approximately 50% narrowing of the lumen. Left carotid system: There is mixed calcified and noncalcified plaque within the left common carotid artery without greater than 50% stenosis. There is mixed calcified and noncalcified plaque at the left carotid bifurcation resulting in approximately 80% stenosis. Vertebral arteries: There is atherosclerotic calcification at the vertebral artery origins bilaterally. The left vertebral artery is occluded proximally in shows no contrast enhancement along its V2 segment. Enhancement of the right V3 and V4 segments of likely retrograde. There is no hemodynamically significant stenosis of the dominant left vertebral artery. Skeleton: There is multilevel uncovertebral and facet hypertrophy preserved Ing and severe foraminal stenosis at left C3-4, right C4-5 and mild-to-moderate stenosis at the other cervical levels. Other neck: The nasopharynx is clear. The oropharynx and hypopharynx are normal.  The epiglottis is normal. The supraglottic larynx, glottis and subglottic larynx are normal. No retropharyngeal collection. The parapharyngeal spaces are preserved. The parotid and submandibular glands are normal. No sialolithiasis or salivary ductal dilatation. The thyroid gland is normal. There is no cervical lymphadenopathy. Upper chest: There is biapical emphysema. Review of the MIP images confirms the above findings CTA HEAD FINDINGS Anterior circulation: --Intracranial internal carotid arteries: There is noncalcified plaque within the distal petrous segment of the right ICA and mixed calcified and noncalcified plaque within the cavernous and clinoid segments. There is approximately 50% luminal narrowing of the cavernous segment. On the left, there is severe narrowing of the distal petrous segment. There is mixed calcified and noncalcified plaque of the cavernous and clinoid segments without advanced stenosis. --Anterior cerebral arteries: Normal. --Middle cerebral arteries: Normal. --Posterior communicating arteries: Present on the left. Posterior circulation: --Posterior cerebral arteries: Fetal origin of the left posterior cerebral artery. Normal right PCA. --Superior cerebellar arteries: Normal. --Basilar artery: Normal. --Anterior inferior cerebellar arteries: Not visualized, which is not uncommon. --Posterior inferior cerebellar arteries: Normal. Venous sinuses: As permitted by contrast timing, patent. Anatomic variants: Fetal origin of the left PCA. Delayed phase: No parenchymal contrast enhancement. Review of the MIP images confirms the above findings IMPRESSION: 1. No emergent intracranial large vessel occlusion. 2. Severe stenosis of the proximal left internal carotid artery, measuring approximately 80% by NASCET criteria. 3. Severe stenosis of the left internal carotid artery distal petrous segment and approximately 50% stenosis of the cavernous segment of the left ICA and distal petrous segment of  the right ICA. 4. Chronic occlusion of the right vertebral artery. 5. Bilateral common carotid artery stenosis of approximately 50%. 6. Aortic atherosclerosis and biapical emphysema. Electronically Signed   By: Ulyses Jarred M.D.   On: 06/29/2016 21:21        Scheduled Meds: . aspirin  300 mg Rectal Daily   Or  . aspirin  325 mg Oral Daily  . atorvastatin  10 mg Oral Daily  . clopidogrel  75 mg Oral Daily  . enoxaparin (LOVENOX) injection  40 mg Subcutaneous Q24H  . insulin aspart  0-5 Units Subcutaneous QHS  . insulin aspart  0-9 Units Subcutaneous TID WC  . memantine  28 mg Oral Daily  . pantoprazole  40 mg Oral Daily  . pentoxifylline  400 mg Oral TID WC  . potassium chloride  20 mEq Oral Daily  . sertraline  50 mg Oral Daily  . tamsulosin  0.4 mg Oral Daily   Continuous Infusions: . 0.9 % NaCl with KCl 20 mEq / L  100 mL/hr at 07/01/16 1316     LOS: 2 days    Dron Tanna Furry, MD [facility] Pager [phone]  If 7PM-7AM, please contact night-coverage [email or web address] Password [facility] 07/01/2016, 3:28 PM

## 2016-07-01 NOTE — NC FL2 (Signed)
[place] LEVEL OF CARE SCREENING TOOL     IDENTIFICATION  Patient Name: Zachary [name] Birthdate: 11/22/1921 Sex: male Admission Date (Current Location): 06/29/2016  [facility] and [place] Number:  Herbalist and Address:  [facility], [place] [address], [place], [place] [address]      Provider Number: [id number]  Attending Physician Name and Address:  Rosita Fire, MD  Relative Name and Phone Number:  Jeralene Peters, [phone]    Current Level of Care: Hospital Recommended Level of Care: [place] Prior Approval Number:    Date Approved/Denied:   PASRR Number: [id number]  Discharge Plan: SNF    Current Diagnoses: Patient Active Problem List   Diagnosis Date Noted  . Goals of care, counseling/discussion   . Advance care planning   . Palliative care by specialist   . Stenosis of left carotid artery   . Rhabdomyolysis 06/29/2016  . Stroke-like symptoms 06/29/2016  . Acromioclavicular [facility]) joint injury, right, (chronic separation) 06/29/2016  . BPH (benign prostatic hyperplasia) 06/29/2016  . HLD (hyperlipidemia) 06/29/2016  . PAC (premature atrial contraction) 06/29/2016  . Cerebral embolism with cerebral infarction 06/29/2016  . Diabetes mellitus with complication ([place])   . Aortic sclerosis 09/18/2015  . Fatigue 09/18/2015  . Pre-syncope 08/05/2015  . Leukocytosis 08/04/2015  . Syncope 08/04/2015  . Hypogonadism male 08/28/2014  . Diarrhea 08/28/2014  . Screening for prostate cancer 07/30/2014  . Dementia without behavioral disturbance 07/04/2013  . Aftercare following surgery of the circulatory system, [place] 06/19/2013  . CVA (cerebral vascular accident) ([place]) 06/04/2013  . Urinary retention 05/29/2013  . Carotid artery disease ([place]) 05/28/2013  . History of right-sided carotid endarterectomy 2015 05/28/2013  . Pulsus bigeminis 05/20/2013  . Diabetes mellitus ([place]) 05/20/2013  . Weakness  05/20/2013  . Acute ischemic stroke ([place]) 05/20/2013  . Postop check 08/06/2011  . Acute cholecystitis 07/19/2011  . Hypertension 07/15/2011  . PVD (peripheral vascular disease) ([facility])     Orientation RESPIRATION BLADDER Height & Weight     Self, Place  Normal Continent, Indwelling catheter Weight: 88 kg (194 lb) Height:  5\' 11"  (180.3 cm)  BEHAVIORAL SYMPTOMS/MOOD NEUROLOGICAL BOWEL NUTRITION STATUS      Continent Diet (Please see DC summary)  AMBULATORY STATUS COMMUNICATION OF NEEDS Skin   Limited Assist Verbally Normal                       Personal Care Assistance Level of Assistance  Bathing, Feeding, Dressing Bathing Assistance: Limited assistance Feeding assistance: Independent Dressing Assistance: Limited assistance     Functional Limitations Info             SPECIAL CARE FACTORS FREQUENCY  PT (By licensed PT)     PT Frequency: 5x/week              Contractures      Additional Factors Info  Code Status, Allergies, Psychotropic, Insulin Sliding Scale Code Status Info: DNR Allergies Info: Penicillins Psychotropic Info: Zoloft Insulin Sliding Scale Info: 3x daily with meals and at bedtime       Current Medications (07/01/2016):  This is the current hospital active medication list Current Facility-Administered Medications  Medication Dose Route Frequency Provider Last Rate Last Dose  . 0.9 % NaCl with KCl 20 mEq/ L  infusion   Intravenous Continuous Dron Tanna Furry, MD 100 mL/hr at 07/01/16 1316    . acetaminophen (TYLENOL) tablet 650 mg  650 mg  Oral Q4H PRN Samella Parr, NP   650 mg at 06/29/16 2210   Or  . acetaminophen (TYLENOL) solution 650 mg  650 mg Per Tube Q4H PRN Samella Parr, NP       Or  . acetaminophen (TYLENOL) suppository 650 mg  650 mg Rectal Q4H PRN Samella Parr, NP      . aspirin suppository 300 mg  300 mg Rectal Daily Samella Parr, NP       Or  . aspirin tablet 325 mg  325 mg Oral Daily Samella Parr, NP    325 mg at 07/01/16 0855  . atorvastatin (LIPITOR) tablet 10 mg  10 mg Oral Daily Samella Parr, NP   10 mg at 07/01/16 0856  . clopidogrel (PLAVIX) tablet 75 mg  75 mg Oral Daily Rosalin Hawking, MD   75 mg at 07/01/16 0855  . enoxaparin (LOVENOX) injection 40 mg  40 mg Subcutaneous Q24H Samella Parr, NP   40 mg at 06/29/16 2018  . fluticasone (FLONASE) 50 MCG/ACT nasal spray 1 spray  1 spray Each Nare Daily PRN Samella Parr, NP      . insulin aspart (novoLOG) injection 0-5 Units  0-5 Units Subcutaneous QHS Samella Parr, NP      . insulin aspart (novoLOG) injection 0-9 Units  0-9 Units Subcutaneous TID WC Samella Parr, NP   1 Units at 06/30/16 1736  . memantine (NAMENDA XR) 24 hr capsule 28 mg  28 mg Oral Daily Samella Parr, NP   28 mg at 07/01/16 0853  . pantoprazole (PROTONIX) EC tablet 40 mg  40 mg Oral Daily Samella Parr, NP   40 mg at 07/01/16 0856  . pentoxifylline (TRENTAL) CR tablet 400 mg  400 mg Oral TID WC Samella Parr, NP   400 mg at 07/01/16 1309  . potassium chloride 20 MEQ/15ML (10%) solution 20 mEq  20 mEq Oral Daily Dron Tanna Furry, MD   20 mEq at 07/01/16 0857  . sertraline (ZOLOFT) tablet 50 mg  50 mg Oral Daily Samella Parr, NP   50 mg at 07/01/16 0857  . tamsulosin (FLOMAX) capsule 0.4 mg  0.4 mg Oral Daily Samella Parr, NP   0.4 mg at 07/01/16 1157     Discharge Medications: Please see discharge summary for a list of discharge medications.  Relevant Imaging Results:  Relevant Lab Results:   Additional Information SSN: [employer] [place], [place]

## 2016-07-01 NOTE — Clinical Social Work Note (Signed)
CSW called and left voicemail for patient's son-in-law/HCPOA. Will discuss SNF placement with palliative once he calls back.  Zachary [name], [name]

## 2016-07-02 ENCOUNTER — Emergency Department (HOSPITAL_COMMUNITY)
Admission: EM | Admit: 2016-07-02 | Discharge: 2016-07-03 | Disposition: A | Discharge status: Home / Self Care | Payer: Medicare Other | Attending: Emergency Medicine | Admitting: Emergency Medicine

## 2016-07-02 ENCOUNTER — Encounter (HOSPITAL_COMMUNITY): Payer: Self-pay | Admitting: Emergency Medicine

## 2016-07-02 DIAGNOSIS — E118 Type 2 diabetes mellitus with unspecified complications: Secondary | ICD-10-CM

## 2016-07-02 DIAGNOSIS — Z8546 Personal history of malignant neoplasm of prostate: Secondary | ICD-10-CM | POA: Diagnosis not present

## 2016-07-02 DIAGNOSIS — I10 Essential (primary) hypertension: Secondary | ICD-10-CM | POA: Diagnosis not present

## 2016-07-02 DIAGNOSIS — Z022 Encounter for examination for admission to residential institution: Secondary | ICD-10-CM | POA: Diagnosis not present

## 2016-07-02 DIAGNOSIS — Z593 Problems related to living in residential institution: Secondary | ICD-10-CM

## 2016-07-02 DIAGNOSIS — Z87891 Personal history of nicotine dependence: Secondary | ICD-10-CM | POA: Diagnosis not present

## 2016-07-02 DIAGNOSIS — Z8673 Personal history of transient ischemic attack (TIA), and cerebral infarction without residual deficits: Secondary | ICD-10-CM | POA: Diagnosis not present

## 2016-07-02 DIAGNOSIS — Z79899 Other long term (current) drug therapy: Secondary | ICD-10-CM | POA: Insufficient documentation

## 2016-07-02 DIAGNOSIS — F0390 Unspecified dementia without behavioral disturbance: Secondary | ICD-10-CM | POA: Diagnosis not present

## 2016-07-02 LAB — BASIC METABOLIC PANEL
Anion gap: 11 (ref 5–15)
BUN: 6 mg/dL (ref 6–20)
CALCIUM: 8.8 mg/dL — AB (ref 8.9–10.3)
CO2: 26 mmol/L (ref 22–32)
CREATININE: 0.88 mg/dL (ref 0.61–1.24)
Chloride: 104 mmol/L (ref 101–111)
GFR calc Af Amer: >60 mL/min (ref >60)
GFR calc non Af Amer: >60 mL/min (ref >60)
GLUCOSE: 108 mg/dL — AB (ref 65–99)
Potassium: 3.7 mmol/L (ref 3.5–5.1)
Sodium: 141 mmol/L (ref 135–145)

## 2016-07-02 LAB — CBC
HCT: 45.8 % (ref 39.0–52.0)
Hemoglobin: 15.8 g/dL (ref 13.0–17.0)
MCH: 31.7 pg (ref 26.0–34.0)
MCHC: 34.5 g/dL (ref 30.0–36.0)
MCV: 91.8 fL (ref 78.0–100.0)
PLATELETS: 279 10*3/uL (ref 150–400)
RBC: 4.99 MIL/uL (ref 4.22–5.81)
RDW: 13.1 % (ref 11.5–15.5)
WBC: 15.2 10*3/uL — ABNORMAL HIGH (ref 4.0–10.5)

## 2016-07-02 LAB — GLUCOSE, CAPILLARY
GLUCOSE-CAPILLARY: 110 mg/dL — AB (ref 65–99)
GLUCOSE-CAPILLARY: 160 mg/dL — AB (ref 65–99)

## 2016-07-02 LAB — CK: Total CK: 556 U/L — ABNORMAL HIGH (ref 49–397)

## 2016-07-02 MED ORDER — ASPIRIN 325 MG PO TABS: 325.0000 mg | ORAL_TABLET | Freq: Every day | ORAL | 0 refills | Status: DC

## 2016-07-02 MED ORDER — CLOPIDOGREL BISULFATE 75 MG PO TABS: 75.0000 mg | ORAL_TABLET | Freq: Every day | ORAL | 0 refills | Status: DC

## 2016-07-02 MED ORDER — POTASSIUM CHLORIDE 20 MEQ/15ML (10%) PO SOLN: 20.0000 meq | Freq: Every day | ORAL | 0 refills | Status: DC

## 2016-07-02 NOTE — Progress Notes (Addendum)
Physical Therapy Treatment Patient Details Name: Zachary [name] MRN: [id number] DOB: [birth year] Today's Date: 07/02/2016    History of Present Illness 83 y.o. male with medical history significant for CVA affecting left side in 2015 in the setting of right carotid stenosis, status post right carotid endarterectomy in 2015, dementia, BPH with history of urinary retention, hypertension, ? Diabetes although last hemoglobin A1c 2015 was 6.6, chronic leukocytosis, peripheral vascular disease on medical therapy, dyslipidemia.  Patient had an unwitnessed fall, slurred speech and weakness on right arm; pt found to have rhabdomyolysis.  MRI of the brain consistent with multiple small foci of acute ischemia within the left hemisphere predominantly within the left MCA distribution    PT Comments    Pt performed increased gait but fatigued quickly and regressed from +1 on first trial and +2 on 2nd trial of gait.  Pt progressed gait distance but required back to bed due to fatigue.  Plan remains appropriate for SNF at this time and has plans to d/c to [facility] this afternoon.  Daughter present and pleased with patient's progress.  Pt remains a high risk for falling.     Follow Up Recommendations  SNF;Supervision/Assistance - 24 hour     Equipment Recommendations  None recommended by PT    Recommendations for Other Services       Precautions / Restrictions Precautions Precautions: Fall Restrictions Weight Bearing Restrictions: No    Mobility  Bed Mobility Overal bed mobility: Needs Assistance Bed Mobility: Supine to Sit;Sit to Supine     Supine to sit: Mod assist Sit to supine: Max assist   General bed mobility comments: Pt required assist for LE advancement and trunk elevation into sitting.  During sit to supine patient impulsively started to lie down and pulled out his IV.  Pt also required mod assist to reposition.    Transfers Overall transfer level: Needs assistance Equipment  used: Rolling walker (2 wheeled) Transfers: Sit to/from Stand Sit to Stand: Min assist;Mod assist (Initially min assist.  As patient began to fatigue he required mod assist.  )         General transfer comment: min assist from edge of bed, mod assist from chair with arms.  On final attempt to standing patient unable to reach with RUE to grip RW due to fatigue.    Ambulation/Gait Ambulation/Gait assistance: Mod assist;Max assist Ambulation Distance (Feet): 14 Feet (Pt performed additonal trial of 14 ft.  Required +2 assist for second trial as patient began to fatigue quickly.  ) Assistive device: Rolling walker (2 wheeled) Gait Pattern/deviations: Decreased stride length;Step-through pattern;Decreased weight shift to right   Gait velocity interpretation: Below normal speed for age/gender General Gait Details: Pt requires max VCs to keep B feet in RW observed stepping outside of RW with poor safety awareness and flexed posture.  Pt then require chair to be brought to patient after 1st trial.  After 3-4 min rest break patient performed 2nd trial but required back to bed due to fatigue.     Stairs            Wheelchair Mobility    Modified Rankin (Stroke Patients Only) Modified Rankin (Stroke Patients Only) Pre-Morbid Rankin Score: Moderate disability Modified Rankin: Moderately severe disability     Balance Overall balance assessment: Needs assistance;History of Falls Sitting-balance support: Bilateral upper extremity supported;Feet supported Sitting balance-Leahy Scale: Fair   Postural control: Right lateral lean Standing balance support: Bilateral upper extremity supported Standing balance-Leahy Scale: Poor Standing balance comment:  requires UE support and external assist with challenges                    Cognition Arousal/Alertness: Awake/alert Behavior During Therapy: Impulsive Overall Cognitive Status: History of cognitive impairments - at baseline                  General Comments: hx dementia, requires safety cues, impulsive    Exercises      General Comments        Pertinent Vitals/Pain Pain Assessment: No/denies pain Faces Pain Scale: No hurt    Home Living                      Prior Function            PT Goals (current goals can now be found in the care plan section) Acute Rehab PT Goals Patient Stated Goal: daughter would like pt to go to rehab Time For Goal Achievement: 07/14/16 Potential to Achieve Goals: Good Progress towards PT goals: Progressing toward goals    Frequency    Min 3X/week      PT Plan Current plan remains appropriate    Co-evaluation             End of Session Equipment Utilized During Treatment: Gait belt Activity Tolerance: Patient limited by fatigue Patient left: in bed;with bed alarm set;with family/visitor present;with call bell/phone within reach Nurse Communication: Mobility status PT Visit Diagnosis: Difficulty in walking, not elsewhere classified (R26.2)     Time: 4562-5638 PT Time Calculation (min) (ACUTE ONLY): 44 min  Charges:  $Gait Training: 8-22 mins $Therapeutic Activity: 23-37 mins                    G Codes:       Cristela Blue 07-08-2016, 1:40 PM Governor Rooks, PTA pager [place], PTA pager [phone]

## 2016-07-02 NOTE — Progress Notes (Deleted)
CSW called Tammy at [facility] who stated AM CSW on 3/17 should contact [name] at ph: [phone] about pt being accepted at [facility].  Pt was previously accepted at [facility] on 3/16 and may be able to D/C there with pt's daughter's opinion.    Alphonse Guild. Jessalyn Hinojosa, Latanya Presser, LCAS Clinical Social Worker Ph: [phone]

## 2016-07-02 NOTE — Discharge Summary (Signed)
Physician Discharge Summary  Zachary [name] GQQ:[id number] DOB: February 15, 1922 DOA: 06/29/2016  PCP: [name], MD  Admit date: 06/29/2016 Discharge date: 07/02/2016  Admitted From:home Disposition: SNF  Recommendations for Outpatient Follow-up:  1. Follow up with PCP and Vascular Surgeon in 1-2 weeks 2. Please obtain BMP/CBC in one week  Home Health:SNF Equipment/Devices:none Discharge Condition:stable CODE STATUS:DNR Diet recommendation:Diet recommendations: carb modified/heart healthy;Thin liquid Liquids provided via: Cup;No straw Medication Administration: Whole meds with puree Supervision: Patient able to self feed;Intermittent supervision to cue for compensatory strategies Compensations: Slow rate;Small sips/bites Postural Changes and/or Swallow Maneuvers: Seated upright 90 degrees  Brief/Interim Summary:83 y.o.malewith medical history significant for CVA affecting left side in 2015in the setting of right carotid stenosis, status post right carotid endarterectomy in 2015,dementia,BPH with history of urinary retention, hypertension, ?Diabetes although last hemoglobin A1c 2015 was 6.6, chronic leukocytosis evaluated by hematology in the outpatient setting, peripheral vascular disease on medical therapy, dyslipidemia.Patient had an unwitnessed fall, slurred speech and weakness on right arm.  Patient was found to have rhabdomyolysis with CK level of 4388 on admission. MRI of the brain consistent with multiple small foci of acute ischemia within the left hemisphere predominantly within the left MCA distribution. Neurology is following.  Discharge Diagnoses:   # Acute ischemic stroke at left MCA distribution: - Stroke neurology evaluated the patient. Vascular consult service evaluated the patient for symptomatic left ICA stenosis and recommended to follow-up with Dr. [name] in 1-2 weeks for possible carotid endarterectomy. -Echocardiogram with EF of 55-60% with normal wall  motion -Patient has LDL of 99, A1c of 6.6 -Patient is on aspirin, Plavix, Lipitor. As per neurologist patient will be on aspirin and Plavix for 3 months and then Plavix only. Recommended to follow-up with neurologist as an outpatient. Patient was evaluated by PT, OT and social worker and transferring his care to SNF. Continue to provide rehabilitation and supportive care. Patient is clinically improved.  #rhabdomyolysis due to fall ( traumatic); -CK level trending down. Treated with IV fluid. Vision has good oral intake.  # Hypertension:  Monitor blood pressure. Dc metoprolo because of bradycardia.  #Type 2 diabetes: A1c 6.6. Continue oral hypoglycemic agent and recommended to monitor blood sugar level.   #Chronic leukocytosis: WBC counts trending down compared to admission however, on reviewing patient's chart patient has a chronic leukocytosis. He is afebrile and clinically improved. No sign of infection. Reportedly patient follows up with hematologist outpatient. One out of 2 blood culture grew coagulation-negative staph likely contaminant.  #Bradycardia: Evaluated by cardiologist. Discontinued metoprolol. Heart rate improved.  #Goals of care discussion: Discussed with the patient's wife and daughter at bedside. Patient is DO NOT RESUSCITATE. Palliative care service also evaluated the patient.   The patient is clinically improved. He was alert awake and eating by himself. I discussed with the patient's daughter at bedside and reviewed the plan of care. Patient will be discharged to rehabilitation with close follow-up with vascular surgery and neurologist. Recommended to monitor blood sugar level, blood pressure and follow-up with PCP.  Principal Problem:   Rhabdomyolysis Active Problems:   Hypertension   PVD (peripheral vascular disease) ([place])   Diabetes mellitus ([place])   History of right-sided carotid endarterectomy 2015   Dementia without behavioral disturbance   Leukocytosis    Stroke-like symptoms   Acromioclavicular (AC) joint injury, right, (chronic separation)   BPH (benign prostatic hyperplasia)   HLD (hyperlipidemia)   PAC (premature atrial contraction)   Cerebral embolism with cerebral infarction   Stenosis  of left carotid artery   Goals of care, counseling/discussion   Advance care planning   Palliative care by specialist    Discharge Instructions  Discharge Instructions    Ambulatory referral to Neurology    Complete by:  As directed    Follow up with Dr. Carles Collet at [facility] in 4-6 weeks. Thanks.   Call MD for:  difficulty breathing, headache or visual disturbances    Complete by:  As directed    Call MD for:  extreme fatigue    Complete by:  As directed    Call MD for:  hives    Complete by:  As directed    Call MD for:  persistant dizziness or light-headedness    Complete by:  As directed    Call MD for:  persistant nausea and vomiting    Complete by:  As directed    Call MD for:  severe uncontrolled pain    Complete by:  As directed    Call MD for:  temperature >100.4    Complete by:  As directed    Diet - low sodium heart healthy    Complete by:  As directed    Diet recommendations: Regular;Thin liquid Liquids provided via: Cup;No straw Medication Administration: Whole meds with puree Supervision: Patient able to self feed;Intermittent supervision to cue for compensatory strategies Compensations: Slow rate;Small sips/bites Postural Changes and/or Swallow Maneuvers: Seated upright 90 degrees   Diet Carb Modified    Complete by:  As directed    Discharge instructions    Complete by:  As directed    As per neurology, take aspirin and plavix for 3 months and then plavix only. Please follow up with neurologist.  Follow up with Dr. [name] (vascular) in 1-2 weeks.   Increase activity slowly    Complete by:  As directed      Allergies as of 07/02/2016      Reactions   Penicillins Rash   Has patient had a PCN reaction causing immediate rash,  facial/tongue/throat swelling, SOB or lightheadedness with hypotension: No Has patient had a PCN reaction causing severe rash involving mucus membranes or skin necrosis: No Has patient had a PCN reaction that required hospitalization No Has patient had a PCN reaction occurring within the last 10 years: No If all of the above answers are "NO", then may proceed with Cephalosporin use.      Medication List    STOP taking these medications   metoprolol tartrate 25 MG tablet Commonly known as:  LOPRESSOR     TAKE these medications   aspirin 325 MG tablet Take 1 tablet (325 mg total) by mouth daily. Start taking on:  07/03/2016   atorvastatin 10 MG tablet Commonly known as:  LIPITOR Take 1 tablet (10 mg total) by mouth daily.   clopidogrel 75 MG tablet Commonly known as:  PLAVIX Take 1 tablet (75 mg total) by mouth daily. Start taking on:  07/03/2016   ergocalciferol 50000 units capsule Commonly known as:  VITAMIN D2 Take 50,000 Units by mouth once a week. Friday   fluticasone 50 MCG/ACT nasal spray Commonly known as:  FLONASE Place 1 spray into both nostrils daily as needed for allergies or rhinitis.   glimepiride 4 MG tablet Commonly known as:  AMARYL Take 4 mg by mouth daily with breakfast.   metFORMIN 500 MG tablet Commonly known as:  GLUCOPHAGE Take 500 mg by mouth 2 (two) times daily with a meal.   multivitamin with minerals Tabs tablet  Take 1 tablet by mouth daily.   NAMENDA XR 28 MG Cp24 24 hr capsule Generic drug:  memantine Take 28 mg by mouth daily.   omeprazole 20 MG capsule Commonly known as:  PRILOSEC Take 20 mg by mouth daily.   pentoxifylline 400 MG CR tablet Commonly known as:  TRENTAL Take 400 mg by mouth 3 (three) times daily with meals.   potassium chloride 20 MEQ/15ML (10%) Soln Take 15 mLs (20 mEq total) by mouth daily. Start taking on:  07/03/2016   sertraline 50 MG tablet Commonly known as:  ZOLOFT Take 50 mg by mouth daily.    tamsulosin 0.4 MG Caps capsule Commonly known as:  FLOMAX Take 0.4 mg by mouth daily.   zolpidem 5 MG tablet Commonly known as:  AMBIEN Take 5 mg by mouth at bedtime as needed for sleep.       Contact information for follow-up providers    TAT, [place], DO. Schedule an appointment as soon as possible for a visit in 4 week(s).   Specialty:  Neurology Contact information: [address] [place] [address] [phone]        [name], MD. Schedule an appointment as soon as possible for a visit in 1 week(s).   Specialty:  Internal Medicine Contact information: [address]  [place] [address] [phone]        Curt Jews, MD. Schedule an appointment as soon as possible for a visit in 1 week(s).   Specialties:  Vascular Surgery, Cardiology Contact information: [address] [place] [address] [phone]            Contact information for after-discharge care    Destination    [facility] Follow up.   Specialty:  [place] information: [address] [address] [phone]                 Allergies  Allergen Reactions  . Penicillins Rash    Has patient had a PCN reaction causing immediate rash, facial/tongue/throat swelling, SOB or lightheadedness with hypotension: No Has patient had a PCN reaction causing severe rash involving mucus membranes or skin necrosis: No Has patient had a PCN reaction that required hospitalization No Has patient had a PCN reaction occurring within the last 10 years: No If all of the above answers are "NO", then may proceed with Cephalosporin use.    Consultations: Neurologist Vascular surgery Palliative care  Procedures/Studies: None  Subjective: Patient was seen and examined at bedside. He was alert awake and eating by himself. Denied headache, dizziness, nausea, vomiting, chest pain or  shortness of breath.  Discharge Exam: Vitals:   07/02/16 0700 07/02/16 0716  BP: 122/74   Pulse: 70   Resp: 16   Temp: 98.1 F (36.7 C) 98.1 F (36.7 C)   Vitals:   07/01/16 0435 07/01/16 0529 07/02/16 0700 07/02/16 0716  BP:  (!) 158/53 122/74   Pulse:  66 70   Resp:  18 16   Temp:  97.9 F (36.6 C) 98.1 F (36.7 C) 98.1 F (36.7 C)  TempSrc:    Oral  SpO2: 96% 95% 97%   Weight:      Height:        General: Pt is alert, awake, not in acute distress Cardiovascular: RRR, S1/S2 +, no rubs, no gallops Respiratory: CTA bilaterally, no wheezing, no rhonchi Abdominal: Soft, NT, ND, bowel sounds + Extremities:  no edema, no cyanosis   The results of significant diagnostics from this hospitalization (including imaging, microbiology, ancillary and laboratory) are listed below for reference.     Microbiology: Recent Results (from the past 240 hour(s))  Culture, blood (Routine X 2) w Reflex to ID Panel     Status: None (Preliminary result)   Collection Time: 06/29/16  2:20 PM  Result Value Ref Range Status   Specimen Description BLOOD RIGHT HAND  Final   Special Requests BOTTLES DRAWN AEROBIC ONLY  5CC  Final   Culture NO GROWTH 2 DAYS  Final   Report Status PENDING  Incomplete  Culture, blood (Routine X 2) w Reflex to ID Panel     Status: Abnormal   Collection Time: 06/29/16  2:24 PM  Result Value Ref Range Status   Specimen Description BLOOD LEFT ANTECUBITAL  Final   Special Requests IN PEDIATRIC BOTTLE  4CC  Final   Culture  Setup Time   Final    GRAM POSITIVE COCCI IN CLUSTERS IN PEDIATRIC BOTTLE Organism ID to follow CRITICAL RESULT CALLED TO, READ BACK BY AND VERIFIED WITH: J. Carney Pharm.D. 16:45 06/30/16 ([email or web address])    Culture (A)  Final    STAPHYLOCOCCUS SPECIES (COAGULASE NEGATIVE) THE SIGNIFICANCE OF ISOLATING THIS ORGANISM FROM A SINGLE SET OF BLOOD CULTURES WHEN MULTIPLE SETS ARE DRAWN IS UNCERTAIN. PLEASE NOTIFY THE MICROBIOLOGY DEPARTMENT WITHIN ONE WEEK IF  SPECIATION AND SENSITIVITIES ARE REQUIRED.    Report Status 07/01/2016 FINAL  Final  Blood Culture ID Panel (Reflexed)     Status: None   Collection Time: 06/29/16  2:24 PM  Result Value Ref Range Status   Enterococcus species NOT DETECTED NOT DETECTED Final   Listeria monocytogenes NOT DETECTED NOT DETECTED Final   Staphylococcus species NOT DETECTED NOT DETECTED Final   Staphylococcus aureus NOT DETECTED NOT DETECTED Final   Streptococcus species NOT DETECTED NOT DETECTED Final   Streptococcus agalactiae NOT DETECTED NOT DETECTED Final   Streptococcus pneumoniae NOT DETECTED NOT DETECTED Final   Streptococcus pyogenes NOT DETECTED NOT DETECTED Final   Acinetobacter baumannii NOT DETECTED NOT DETECTED Final   Enterobacteriaceae species NOT DETECTED NOT DETECTED Final   Enterobacter cloacae complex NOT DETECTED NOT DETECTED Final   Escherichia coli NOT DETECTED NOT DETECTED Final   Klebsiella oxytoca NOT DETECTED NOT DETECTED Final   Klebsiella pneumoniae NOT DETECTED NOT DETECTED Final   Proteus species NOT DETECTED NOT DETECTED Final   Serratia marcescens NOT DETECTED NOT DETECTED Final   Haemophilus influenzae NOT DETECTED NOT DETECTED Final   Neisseria meningitidis NOT DETECTED NOT DETECTED Final   Pseudomonas aeruginosa NOT DETECTED NOT DETECTED Final   Candida albicans NOT DETECTED NOT DETECTED Final   Candida glabrata NOT DETECTED NOT DETECTED Final   Candida krusei NOT DETECTED NOT DETECTED Final   Candida parapsilosis NOT DETECTED NOT DETECTED Final   Candida tropicalis NOT DETECTED NOT DETECTED Final     Labs: BNP (last 3 results) No results for input(s): BNP in the last 8760 hours. Basic Metabolic Panel:  Recent Labs Lab 06/29/16 1132 06/29/16 1143 06/29/16 1413 06/30/16 0608 07/01/16 0416 07/02/16 0852  NA 139 142  --  140 141 141  K 3.8 3.8  --  3.3* 3.7 3.7  CL 102 105  --  106 110 104  CO2 22  --   --  21* 22 26  GLUCOSE 152* 156*  --  122* 111* 108*   BUN 20 25*  --  21* 15 6  CREATININE 1.13 1.00  --  1.07 0.97 0.88  CALCIUM 9.1  --   --  8.4* 8.5* 8.8*  MG  --   --  1.8  --   --   --    Liver Function Tests:  Recent Labs Lab 06/29/16 1132 06/30/16 0608  AST 108* 94*  ALT 30 31  ALKPHOS 60 50  BILITOT 1.5* 1.4*  PROT 6.8 5.8*  ALBUMIN 3.7 3.0*   No results for input(s): LIPASE, AMYLASE in the last 168 hours. No results for input(s): AMMONIA in the last 168 hours. CBC:  Recent Labs Lab 06/29/16 1132 06/29/16 1143 06/30/16 0608 07/02/16 0852  WBC 22.0*  --  14.9* 15.2*  NEUTROABS 16.5*  --  9.5*  --   HGB 16.3 16.3 14.0 15.8  HCT 46.4 48.0 40.5 45.8  MCV 92.1  --  91.4 91.8  PLT 277  --  219 279   Cardiac Enzymes:  Recent Labs Lab 06/29/16 1132 06/30/16 0608 07/01/16 0416 07/02/16 0852  CKTOTAL 4,388* 2,444* 1,243* 556*   BNP: Invalid input(s): POCBNP CBG:  Recent Labs Lab 07/01/16 0755 07/01/16 1310 07/01/16 1716 07/01/16 2322 07/02/16 0755  GLUCAP 111* 119* 133* 118* 110*   D-Dimer No results for input(s): DDIMER in the last 72 hours. Hgb A1c  Recent Labs  06/30/16 0608  HGBA1C 9.5*   Lipid Profile  Recent Labs  06/30/16 0608  CHOL 147  HDL 25*  LDLCALC 99  TRIG 113  CHOLHDL 5.9   Thyroid function studies No results for input(s): TSH, T4TOTAL, T3FREE, THYROIDAB in the last 72 hours.  Invalid input(s): FREET3 Anemia work up No results for input(s): VITAMINB12, FOLATE, FERRITIN, TIBC, IRON, RETICCTPCT in the last 72 hours. Urinalysis    Component Value Date/Time   COLORURINE YELLOW 06/29/2016 1649   APPEARANCEUR HAZY (A) 06/29/2016 1649   LABSPEC 1.023 06/29/2016 1649   PHURINE 5.0 06/29/2016 1649   GLUCOSEU 50 (A) 06/29/2016 1649   HGBUR LARGE (A) 06/29/2016 1649   BILIRUBINUR NEGATIVE 06/29/2016 1649   KETONESUR 20 (A) 06/29/2016 1649   PROTEINUR 30 (A) 06/29/2016 1649   UROBILINOGEN 0.2 01/21/2014 1425   NITRITE NEGATIVE 06/29/2016 1649   LEUKOCYTESUR NEGATIVE  06/29/2016 1649   Sepsis Labs Invalid input(s): PROCALCITONIN,  WBC,  LACTICIDVEN Microbiology Recent Results (from the past 240 hour(s))  Culture, blood (Routine X 2) w Reflex to ID Panel     Status: None (Preliminary result)   Collection Time: 06/29/16  2:20 PM  Result Value Ref Range Status   Specimen Description BLOOD RIGHT HAND  Final   Special Requests BOTTLES DRAWN AEROBIC ONLY  5CC  Final   Culture NO GROWTH 2 DAYS  Final   Report Status PENDING  Incomplete  Culture, blood (Routine X 2) w Reflex to ID Panel     Status: Abnormal   Collection Time: 06/29/16  2:24 PM  Result Value Ref Range Status   Specimen Description BLOOD LEFT ANTECUBITAL  Final   Special Requests IN PEDIATRIC BOTTLE  4CC  Final   Culture  Setup Time   Final    GRAM POSITIVE COCCI IN CLUSTERS IN PEDIATRIC BOTTLE Organism ID to follow CRITICAL RESULT CALLED TO, READ BACK BY AND VERIFIED WITH: J. Carney Pharm.D. 16:45 06/30/16 ([email or web address])    Culture (A)  Final    STAPHYLOCOCCUS SPECIES (COAGULASE NEGATIVE) THE SIGNIFICANCE OF ISOLATING THIS ORGANISM FROM A SINGLE SET OF BLOOD CULTURES WHEN MULTIPLE SETS ARE DRAWN IS UNCERTAIN. PLEASE NOTIFY THE MICROBIOLOGY  DEPARTMENT WITHIN ONE WEEK IF SPECIATION AND SENSITIVITIES ARE REQUIRED.    Report Status 07/01/2016 FINAL  Final  Blood Culture ID Panel (Reflexed)     Status: None   Collection Time: 06/29/16  2:24 PM  Result Value Ref Range Status   Enterococcus species NOT DETECTED NOT DETECTED Final   Listeria monocytogenes NOT DETECTED NOT DETECTED Final   Staphylococcus species NOT DETECTED NOT DETECTED Final   Staphylococcus aureus NOT DETECTED NOT DETECTED Final   Streptococcus species NOT DETECTED NOT DETECTED Final   Streptococcus agalactiae NOT DETECTED NOT DETECTED Final   Streptococcus pneumoniae NOT DETECTED NOT DETECTED Final   Streptococcus pyogenes NOT DETECTED NOT DETECTED Final   Acinetobacter baumannii NOT DETECTED NOT DETECTED Final    Enterobacteriaceae species NOT DETECTED NOT DETECTED Final   Enterobacter cloacae complex NOT DETECTED NOT DETECTED Final   Escherichia coli NOT DETECTED NOT DETECTED Final   Klebsiella oxytoca NOT DETECTED NOT DETECTED Final   Klebsiella pneumoniae NOT DETECTED NOT DETECTED Final   Proteus species NOT DETECTED NOT DETECTED Final   Serratia marcescens NOT DETECTED NOT DETECTED Final   Haemophilus influenzae NOT DETECTED NOT DETECTED Final   Neisseria meningitidis NOT DETECTED NOT DETECTED Final   Pseudomonas aeruginosa NOT DETECTED NOT DETECTED Final   Candida albicans NOT DETECTED NOT DETECTED Final   Candida glabrata NOT DETECTED NOT DETECTED Final   Candida krusei NOT DETECTED NOT DETECTED Final   Candida parapsilosis NOT DETECTED NOT DETECTED Final   Candida tropicalis NOT DETECTED NOT DETECTED Final     Time coordinating discharge: 32 minutes  SIGNED:   Rosita Fire, MD  [facility] 07/02/2016, 11:46 AM  If 7PM-7AM, please contact night-coverage [email or web address] Password [email or web address]

## 2016-07-02 NOTE — ED Notes (Signed)
Notified Mariann Laster, [place] of patient's situation. Social work to look into case.

## 2016-07-02 NOTE — Clinical Social Work Placement (Signed)
   CLINICAL SOCIAL WORK PLACEMENT  NOTE  Date:  07/02/2016  Patient Details  Name: Zachary [name] MRN: [id number] Date of Birth: [birth year]  Clinical Social Work is seeking post-discharge placement for this patient at the [place] level of care (*CSW will initial, date and re-position this form in  chart as items are completed):  Yes   Patient/family provided with [facility] [facility] Work Department's list of facilities offering this level of care within the geographic area requested by the patient (or if unable, by the patient's family).  Yes   Patient/family informed of their freedom to choose among providers that offer the needed level of care, that participate in Medicare, Medicaid or managed care program needed by the patient, have an available bed and are willing to accept the patient.  Yes   Patient/family informed of [facility]'s ownership interest in [facility] and [facility], as well as of the fact that they are under no obligation to receive care at these facilities.  PASRR submitted to EDS on       PASRR number received on       Existing PASRR number confirmed on 07/01/16     FL2 transmitted to all facilities in geographic area requested by pt/family on 07/01/16     FL2 transmitted to all facilities within larger geographic area on       Patient informed that his/her managed care company has contracts with or will negotiate with certain facilities, including the following:        Yes   Patient/family informed of bed offers received.  Patient chooses bed at [facility] recommends and patient chooses bed at      Patient to be transferred to [facility] on 07/02/16.  Patient to be transferred to facility by [facility]     Patient family notified on 07/02/16 of transfer.  Name of family member notified:  Martina Sinner     PHYSICIAN Please sign FL2     Additional Comment:     _______________________________________________ Benard Halsted, LCSWA 07/02/2016, 1:48 PM

## 2016-07-02 NOTE — ED Notes (Signed)
Spoke with Roderic Palau (CSW at [facility]) - to call [facility] back regarding HL not accepting patient d/t terms of leaving the facility (daughter signing him out AMA). CSW will have to follow-up with other accepting facilities in the morning.

## 2016-07-02 NOTE — ED Triage Notes (Signed)
Per [facility]: Pt to ED from [facility] because "daughter wants him placed in a different facility other than [facility]." Patient had a CVA with R sided deficits 3 days ago and was discharged and released to [facility] earlier today. Patient's daughter went to see him at [facility] and saw that patient was attempting to get out of bed and noticed that there aren't bedside rails on bed and requested that he be transported back to [facility]. [facility] explained to patient's daughter that the facility was appropriate to take care of him and she would have to sign him out AMA. Pt's daughter signed him out AMA and requested that he come here. EMS VS: 154/72, P 84, 97% RA, CBG 171. Patient does have R sided weakness but no new stroke symptoms, neuro at baseline.

## 2016-07-02 NOTE — Care Management Important Message (Signed)
Important Message  Patient Details  Name: Zachary [name] MRN: [id number] Date of Birth: [birth year]   Medicare Important Message Given:  Yes    Nathen May 07/02/2016, 3:54 PM

## 2016-07-02 NOTE — ED Provider Notes (Signed)
Emergency Department Provider Note   I have reviewed the triage vital signs and the nursing notes.   HISTORY  Chief Complaint Nursing Home Placement   HPI Zachary [name] is a 83 y.o. male with PMH of GERD, dementia, HTN, PVD, and recent CVA with right sided deficits presents to the ED for evaluation of nursing home placement. The patient was recently admitted and discharged from the hospital and placed in a SNF, [place]. The patient's daughter signed the patient out from [facility] because she was concerned that they did not have bed rails. They tried to assure her that they had appropriate staffing but the patient insistent on AMA departure by EMS to the ED for placement at a different facility.   The patient has no new or worsening deficits.   Level 5 caveat: Dementia.   Past Medical History:  Diagnosis Date  . Arthritis   . Carotid artery occlusion   . GERD (gastroesophageal reflux disease)   . Headache    "sometimes monthly" (06/29/2016)  . Heart block   . History of blood transfusion    "while in the service"  . History of stomach ulcers   . Hypertension   . Insomnia   . Pancreatitis   . Prostate cancer ([place])    Archie Endo 09/02/2010  . Prostate enlargement   . PVC's (premature ventricular contractions)   . PVD (peripheral vascular disease) ([place])   . Stroke ([place]) 05/2013; 06/29/2016   Archie Endo 05/24/2013  . Type II diabetes mellitus ([place]) dx'd 2008   Archie Endo 08/19/2010    Patient Active Problem List   Diagnosis Date Noted  . Goals of care, counseling/discussion   . Advance care planning   . Palliative care by specialist   . Stenosis of left carotid artery   . Rhabdomyolysis 06/29/2016  . Stroke-like symptoms 06/29/2016  . Acromioclavicular [facility]) joint injury, right, (chronic separation) 06/29/2016  . BPH (benign prostatic hyperplasia) 06/29/2016  . HLD (hyperlipidemia) 06/29/2016  . PAC (premature atrial contraction) 06/29/2016  . Cerebral embolism with  cerebral infarction 06/29/2016  . Diabetes mellitus with complication ([place])   . Aortic sclerosis 09/18/2015  . Fatigue 09/18/2015  . Pre-syncope 08/05/2015  . Leukocytosis 08/04/2015  . Syncope 08/04/2015  . Hypogonadism male 08/28/2014  . Diarrhea 08/28/2014  . Screening for prostate cancer 07/30/2014  . Dementia without behavioral disturbance 07/04/2013  . Aftercare following surgery of the circulatory system, [place] 06/19/2013  . Stroke ([place]) 06/04/2013  . Urinary retention 05/29/2013  . Carotid artery disease ([place]) 05/28/2013  . History of right-sided carotid endarterectomy 2015 05/28/2013  . Pulsus bigeminis 05/20/2013  . Diabetes mellitus ([place]) 05/20/2013  . Weakness 05/20/2013  . Acute ischemic stroke ([place]) 05/20/2013  . Postop check 08/06/2011  . Acute cholecystitis 07/19/2011  . Hypertension 07/15/2011  . PVD (peripheral vascular disease) ([place])     Past Surgical History:  Procedure Laterality Date  . CATARACT EXTRACTION W/ INTRAOCULAR LENS  IMPLANT, BILATERAL Bilateral   . CHOLECYSTECTOMY  07/18/2011   Procedure: LAPAROSCOPIC CHOLECYSTECTOMY;  Surgeon: Gwenyth Ober, MD;  Location: [facility];  Service: General;  Laterality: N/A;  . ENDARTERECTOMY Right 05/28/2013   Procedure: ENDARTERECTOMY CAROTID;  Surgeon: Rosetta Posner, MD;  Location: [facility];  Service: Vascular;  Laterality: Right;  . ESOPHAGOGASTRODUODENOSCOPY (EGD) WITH ESOPHAGEAL DILATION  2004; 12/2009   Archie Endo 01/08/2010  . FLEXIBLE SIGMOIDOSCOPY N/A 09/23/2014   Procedure: FLEXIBLE SIGMOIDOSCOPY;  Surgeon: Garlan Fair, MD;  Location: [facility];  Service: Endoscopy;  Laterality: N/A;  . INSERTION PROSTATE RADIATION SEED  03/1999   Archie Endo 09/02/2010  . PROSTATE BIOPSY  2000   Archie Endo 09/02/2010  . TONSILLECTOMY      Current Outpatient Rx  . Order #: [id number] Class: Print  . Order #: [id number] Class: No Print  . Order #: [id number] Class: Print  . Order #: [id number] Class: Historical Med  . Order #: [id number] Class:  Historical Med  . Order #: [id number] Class: Historical Med  . Order #: [id number] Class: Historical Med  . Order #: [id number] Class: Historical Med  . Order #: [id number] Class: Historical Med  . Order #: [id number] Class: Historical Med  . Order #: [id number] Class: Historical Med  . Order #: [id number] Class: Print  . Order #: [id number] Class: Historical Med  . Order #: [id number] Class: Historical Med  . Order #: [id number] Class: Historical Med    Allergies Penicillins  Family History  Problem Relation Age of Onset  . Breast cancer Daughter   . Cancer Daughter 36    died of cancer   . Stroke Father   . Other      hypogonadism  . Heart attack Neg Hx   . Hypertension Neg Hx     Social History Social History  Substance Use Topics  . Smoking status: Former Smoker    Packs/day: 3.00    Years: 20.00    Quit date: 04/19/1962  . Smokeless tobacco: Never Used  . Alcohol use No    Review of Systems  Level 5 caveat: Dementia.   ____________________________________________   PHYSICAL EXAM:  VITAL SIGNS: ED Triage Vitals  Enc Vitals Group     BP 07/02/16 1926 (!) 149/53     Pulse Rate 07/02/16 1926 73     Resp 07/02/16 1926 20     Temp 07/02/16 1926 98.9 F (37.2 C)     Temp Source 07/02/16 1926 Oral     SpO2 07/02/16 1918 97 %     Pain Score 07/02/16 1924 0    Constitutional: Drowsy and confused.  Eyes: Conjunctivae are normal.  Head: Atraumatic. Nose: No congestion/rhinnorhea. Mouth/Throat: Mucous membranes are moist.  Neck: No stridor.   Cardiovascular: Normal rate, regular rhythm. Good peripheral circulation. Grossly normal heart sounds.   Respiratory: Normal respiratory effort.  No retractions. Lungs CTAB. Gastrointestinal: Soft and nontender. No distention.  Musculoskeletal: No lower extremity tenderness nor edema. No gross deformities of extremities. Neurologic:  Normal speech and language. Baseline right sided weakness.  Skin:  Skin is warm, dry and intact. No rash  noted.  ____________________________________________   PROCEDURES  Procedure(s) performed:   Procedures  None ____________________________________________   INITIAL IMPRESSION / ASSESSMENT AND PLAN / ED COURSE  Pertinent labs & imaging results that were available during my care of the patient were reviewed by me and considered in my medical decision making (see chart for details).  Patient presents today after signing out [place] from the nursing home. Patient has dementia. Social Work consulted immediately who had a long discussion with daughter. No acute medical complaints at this time. No indication for further imaging or labs.   08:45 PM After discussion with SW and family the patient is ok with patient return to [facility]. After trying to coordinate return tonight the facility does not have staffing to accept the patient now and will have to wait until the AM. Patient calm and cooperative at this time.   At this time, I do not feel there is any life-threatening condition present. I have reviewed and discussed all results (EKG, imaging,  lab, urine as appropriate), exam findings with patient. I have reviewed nursing notes and appropriate previous records.  I feel the patient is safe to be discharged home without further emergent workup. Discussed usual and customary return precautions. Patient and family (if present) verbalize understanding and are comfortable with this plan.  Patient will follow-up with their primary care provider. If they do not have a primary care provider, information for follow-up has been provided to them. All questions have been answered.  ____________________________________________  FINAL CLINICAL IMPRESSION(S) / ED DIAGNOSES  Final diagnoses:  Nursing home resident     MEDICATIONS GIVEN DURING THIS VISIT:  None  NEW OUTPATIENT MEDICATIONS STARTED DURING THIS VISIT:  None   Note:  This document was prepared using Dragon voice  recognition software and may include unintentional dictation errors.  Nanda Quinton, MD Emergency Medicine   Margette Fast, MD 07/03/16 [phone]

## 2016-07-02 NOTE — Care Management (Signed)
ED CM was consulted by Jerene Pitch RN on Pod E concerning Patient who was discharge today form [facility] to  [facility]. As per daughter Jeremy Johann she was concerned for his safety patient is  high fall risk and lack of bed rails and signed him and returned him to [facility] ED. ED CM consulted [facility] ED CSW concerning patient.  CSW and CM spoke with patient's family, who states that the SNF was acceptable, so she has agreed for him to return. Family is insisting  that he not go back until tomorrow morning.due to his age and condition. ED Charge made aware.patient will stay in the ED and be transported to [facility] via [place] the am. CSW will report off to CSW in the am.

## 2016-07-02 NOTE — Progress Notes (Signed)
  Speech Language Pathology Treatment: Dysphagia  Patient Details Name: Zachary [name] MRN: [id number] DOB: [birth year] Today's Date: 07/02/2016 Time: 2595-6387 SLP Time Calculation (min) (ACUTE ONLY): 9 min  Assessment / Plan / Recommendation Clinical Impression  Pt alert, daughter and wife at bedside; reported no difficulty swallowing past 1-2 days. Demonstrated functional and safe consumption of thin and solid texture. Reiterated swallow precautions. Pt discharging today to SNF and plans are for [facility] in 1-2 weeks. Goal met, discharge ST. May not need f/u at SNF.    HPI HPI: Zachary [name] a 83 y.o.malewith medical history significant for CVA affecting left side in 2015, right carotid endarterectomy in 2015,dementia,HTN, GERD, DM prostate ca, chronic leukocytosi. Admitted after unwitnessed fall (although he reported to staff that he just sat down the floor because he was tired), slurred speech and weakness of the right arm. MRI Multiple small foci of acute ischemia within the left hemisphere, predominantly within the left MCA distribution. There is a single focus in the left occipital lobe. Chronic acromioclavicular separation on the right.      SLP Plan  All goals met;Discharge SLP treatment due to (comment)       Recommendations  Diet recommendations: Regular;Thin liquid Liquids provided via: Cup;No straw Medication Administration: Whole meds with puree Supervision: Patient able to self feed;Intermittent supervision to cue for compensatory strategies Compensations: Slow rate;Small sips/bites Postural Changes and/or Swallow Maneuvers: Seated upright 90 degrees                Oral Care Recommendations: Oral care BID Follow up Recommendations: None SLP Visit Diagnosis: Dysphagia, unspecified (R13.10) Plan: All goals met;Discharge SLP treatment due to (comment)       GO                Zachary [name] 07/02/2016, 11:29 AM  [name].Ed  [employer] [phone]

## 2016-07-02 NOTE — Progress Notes (Signed)
Pt prepared for d/c to SNF. IV d/c'd. Skin intact except as charted in most recent assessments. Vitals are stable. Report called to receiving facility. Pt to be transported by ambulance service. 

## 2016-07-02 NOTE — ED Notes (Signed)
Nurse at [facility] called this RN and stated that d/t patient's daughter signing him out AMA, he would no longer be accepted there. MD notified. Will call and update CSW.

## 2016-07-02 NOTE — Progress Notes (Signed)
Patient ID: Zachary [name], male   DOB: 16-Oct-1921, 83 y.o.   MRN: [id number] Comfortable this morning.  No change in neuro status. Long discussion with the patient and his daughter present. He does have some confusion. Would recommend carotid endarterectomy for high-grade symptomatic stenosis. Would favor delaying this for 1-2 weeks to allow him to recover from the acute stroke. Did explain the slight risk of stroke in this interval but feel that this risk is less than proceeding with surgery and general anesthesia in the acute phase of his stroke. Will coordinate this following his discharge to rehabilitation

## 2016-07-02 NOTE — Progress Notes (Addendum)
CSW received call from pt's CM stating pt had returned to [facility] ED after D/C'ing due to pt's daughter  concern pt's [facility] SNF "did not have bedrails" on pt's beds at [facility].  CSW will contact facilities with pt's daughter's permission, that had "accepted" pt before previous D/C on 3/16 to see if they can take pt ASAP.  Per notes, pt is oriented X 2. CSW will continue to follow.  8:26 PM CSW received csall from pt's daughter stating it is acceptable to send pt back to [facility].  CSW  received call from CM confirning this is the plan.  CSW called Educational psychologist at [facility] ph:[phone] who confirmed with the CSW the pt can return to [facility] on 3/16.  Melissa said to alert the pt's daughter there are no bed rails or restraints, but that the facility can lower the bed to it's lowest position for safety.  CSW will update pt's CM at [facility] ED who will alert Melissa at [facility] when pt is enroute.  Per [facility] pt can return on 3/17.  The room number will be 311.  The number for report is [phone].  8:57 PM CSW called pt's CM at [facility] ED and was informed that due to pt's age, fatigue, condition and the family's insistence the pt will be D/C'ing to [facility] on 3/17.  CSW called Melissa RN at [facility] at ph: [phone] and informed her pt's D/C is delayed until 3/17.  Melissa confirmed [facility] will expect the pt on the morning of 3/17. Please reconsult if future social work needs arise.  CSW signing off.   10:40 PM CSW called Educational psychologist at [facility] aand spoke to [employer] at [phone].  CSW confirmed that pt CAN return to [facility] on3/17/18.  Director [place] confirmed pt CAN return and that [facility] is expecting pt on the morning of 3/17.   CSW updated Educational psychologist at [place].  The room number will be 311.  The number for report is [phone].  CSW will update pt's RN.   Alphonse Guild. Denell Cothern, Latanya Presser, LCAS Clinical Social Worker Ph: [phone]

## 2016-07-02 NOTE — Progress Notes (Signed)
Patient will DC to: [facility] Anticipated DC date: 07/02/16 Family notified: Daughter and son in Systems developer by: Domenica Reamer   Per MD patient ready for DC to [facility]. RN, patient, patient's family, and facility notified of DC. Discharge Summary sent to facility. RN given number for report [phone]). DC packet on chart. Ambulance transport requested for patient.   CSW signing off.  Cedric Fishman, [place] Social Worker [phone]

## 2016-07-02 NOTE — ED Notes (Signed)
Zachary [name], CSW spoke with Nursing Director at [facility] to clarify situation. Patient MAY go back to [facility] in the morning (preferably after 9am per CSW).

## 2016-07-03 ENCOUNTER — Encounter (HOSPITAL_COMMUNITY): Payer: Self-pay | Admitting: Emergency Medicine

## 2016-07-03 DIAGNOSIS — I6522 Occlusion and stenosis of left carotid artery: Secondary | ICD-10-CM | POA: Diagnosis not present

## 2016-07-03 DIAGNOSIS — I69328 Other speech and language deficits following cerebral infarction: Secondary | ICD-10-CM | POA: Diagnosis not present

## 2016-07-03 DIAGNOSIS — E119 Type 2 diabetes mellitus without complications: Secondary | ICD-10-CM | POA: Diagnosis not present

## 2016-07-03 DIAGNOSIS — H21561 Pupillary abnormality, right eye: Secondary | ICD-10-CM | POA: Diagnosis not present

## 2016-07-03 DIAGNOSIS — G464 Cerebellar stroke syndrome: Secondary | ICD-10-CM | POA: Diagnosis not present

## 2016-07-03 DIAGNOSIS — R55 Syncope and collapse: Secondary | ICD-10-CM | POA: Diagnosis not present

## 2016-07-03 DIAGNOSIS — R1312 Dysphagia, oropharyngeal phase: Secondary | ICD-10-CM | POA: Diagnosis not present

## 2016-07-03 DIAGNOSIS — Z7984 Long term (current) use of oral hypoglycemic drugs: Secondary | ICD-10-CM | POA: Diagnosis not present

## 2016-07-03 DIAGNOSIS — S43101D Unspecified dislocation of right acromioclavicular joint, subsequent encounter: Secondary | ICD-10-CM | POA: Diagnosis not present

## 2016-07-03 DIAGNOSIS — Z7902 Long term (current) use of antithrombotics/antiplatelets: Secondary | ICD-10-CM | POA: Diagnosis not present

## 2016-07-03 DIAGNOSIS — R4182 Altered mental status, unspecified: Secondary | ICD-10-CM | POA: Diagnosis not present

## 2016-07-03 DIAGNOSIS — Z8546 Personal history of malignant neoplasm of prostate: Secondary | ICD-10-CM | POA: Diagnosis not present

## 2016-07-03 DIAGNOSIS — E1151 Type 2 diabetes mellitus with diabetic peripheral angiopathy without gangrene: Secondary | ICD-10-CM | POA: Diagnosis not present

## 2016-07-03 DIAGNOSIS — K219 Gastro-esophageal reflux disease without esophagitis: Secondary | ICD-10-CM | POA: Diagnosis not present

## 2016-07-03 DIAGNOSIS — R410 Disorientation, unspecified: Secondary | ICD-10-CM | POA: Diagnosis not present

## 2016-07-03 DIAGNOSIS — I69398 Other sequelae of cerebral infarction: Secondary | ICD-10-CM | POA: Diagnosis not present

## 2016-07-03 DIAGNOSIS — Z880 Allergy status to penicillin: Secondary | ICD-10-CM | POA: Diagnosis not present

## 2016-07-03 DIAGNOSIS — F0390 Unspecified dementia without behavioral disturbance: Secondary | ICD-10-CM | POA: Diagnosis not present

## 2016-07-03 DIAGNOSIS — E7800 Pure hypercholesterolemia, unspecified: Secondary | ICD-10-CM | POA: Diagnosis not present

## 2016-07-03 DIAGNOSIS — Z823 Family history of stroke: Secondary | ICD-10-CM | POA: Diagnosis not present

## 2016-07-03 DIAGNOSIS — R488 Other symbolic dysfunctions: Secondary | ICD-10-CM | POA: Diagnosis not present

## 2016-07-03 DIAGNOSIS — N400 Enlarged prostate without lower urinary tract symptoms: Secondary | ICD-10-CM | POA: Diagnosis not present

## 2016-07-03 DIAGNOSIS — D72829 Elevated white blood cell count, unspecified: Secondary | ICD-10-CM | POA: Diagnosis not present

## 2016-07-03 DIAGNOSIS — I639 Cerebral infarction, unspecified: Secondary | ICD-10-CM | POA: Diagnosis not present

## 2016-07-03 DIAGNOSIS — Z79899 Other long term (current) drug therapy: Secondary | ICD-10-CM | POA: Diagnosis not present

## 2016-07-03 DIAGNOSIS — Z8673 Personal history of transient ischemic attack (TIA), and cerebral infarction without residual deficits: Secondary | ICD-10-CM | POA: Diagnosis not present

## 2016-07-03 DIAGNOSIS — Z87891 Personal history of nicotine dependence: Secondary | ICD-10-CM | POA: Diagnosis not present

## 2016-07-03 DIAGNOSIS — M6281 Muscle weakness (generalized): Secondary | ICD-10-CM | POA: Diagnosis not present

## 2016-07-03 DIAGNOSIS — I69391 Dysphagia following cerebral infarction: Secondary | ICD-10-CM | POA: Diagnosis not present

## 2016-07-03 DIAGNOSIS — E118 Type 2 diabetes mellitus with unspecified complications: Secondary | ICD-10-CM | POA: Diagnosis not present

## 2016-07-03 DIAGNOSIS — I10 Essential (primary) hypertension: Secondary | ICD-10-CM | POA: Diagnosis not present

## 2016-07-03 DIAGNOSIS — I6789 Other cerebrovascular disease: Secondary | ICD-10-CM | POA: Diagnosis not present

## 2016-07-03 DIAGNOSIS — W19XXXA Unspecified fall, initial encounter: Secondary | ICD-10-CM | POA: Diagnosis present

## 2016-07-03 DIAGNOSIS — H579 Unspecified disorder of eye and adnexa: Secondary | ICD-10-CM | POA: Diagnosis present

## 2016-07-03 DIAGNOSIS — E785 Hyperlipidemia, unspecified: Secondary | ICD-10-CM | POA: Diagnosis not present

## 2016-07-03 DIAGNOSIS — Z8711 Personal history of peptic ulcer disease: Secondary | ICD-10-CM | POA: Diagnosis not present

## 2016-07-03 DIAGNOSIS — I63232 Cerebral infarction due to unspecified occlusion or stenosis of left carotid arteries: Secondary | ICD-10-CM | POA: Diagnosis not present

## 2016-07-03 DIAGNOSIS — R4781 Slurred speech: Secondary | ICD-10-CM | POA: Diagnosis not present

## 2016-07-03 DIAGNOSIS — R531 Weakness: Secondary | ICD-10-CM | POA: Diagnosis not present

## 2016-07-03 DIAGNOSIS — Z9181 History of falling: Secondary | ICD-10-CM | POA: Diagnosis not present

## 2016-07-03 DIAGNOSIS — R29810 Facial weakness: Secondary | ICD-10-CM | POA: Diagnosis not present

## 2016-07-03 DIAGNOSIS — Z7982 Long term (current) use of aspirin: Secondary | ICD-10-CM | POA: Diagnosis not present

## 2016-07-03 NOTE — ED Notes (Signed)
Per pt's family member - pt was sent to [facility] from [facility] yesterday - family was upset d/t no side rails on bed and felt unsafe. States is OK w/plan for pt to go back to [facility] and family will provide someone to stay w/pt at night.

## 2016-07-03 NOTE — ED Notes (Signed)
Condom cath removed 

## 2016-07-03 NOTE — ED Notes (Signed)
Pt's family member arrived to ED. Pt woke - breakfast heated for pt.

## 2016-07-03 NOTE — ED Notes (Signed)
Condom catheter attached to penis - draining clear yellow urine. Pt noted to be wearing hospiall gown.

## 2016-07-03 NOTE — ED Provider Notes (Signed)
Patient and family prior to discharge. Review of the 83 year old male who presented from a skilled nursing facility as they had wanted a transfer. Social worker discussed with family and [facility] and he has been accepted back and family is in understanding.  Patient eating breakfast, no acute concerns this AM. Stable for discharge back to facility.   Gareth Morgan, MD 07/03/16 [phone]

## 2016-07-03 NOTE — ED Notes (Signed)
Verified w/pt's family members pt arrived to ED from [facility] wearing hospital gown w/no belongings.

## 2016-07-04 ENCOUNTER — Encounter (HOSPITAL_COMMUNITY): Payer: Self-pay | Admitting: Emergency Medicine

## 2016-07-04 ENCOUNTER — Emergency Department (HOSPITAL_COMMUNITY): Payer: Medicare Other

## 2016-07-04 ENCOUNTER — Emergency Department (HOSPITAL_COMMUNITY)
Admission: EM | Admit: 2016-07-04 | Discharge: 2016-07-04 | Disposition: A | Discharge status: Home / Self Care | Payer: Medicare Other | Attending: Emergency Medicine | Admitting: Emergency Medicine

## 2016-07-04 DIAGNOSIS — I10 Essential (primary) hypertension: Secondary | ICD-10-CM | POA: Insufficient documentation

## 2016-07-04 DIAGNOSIS — Z8673 Personal history of transient ischemic attack (TIA), and cerebral infarction without residual deficits: Secondary | ICD-10-CM | POA: Insufficient documentation

## 2016-07-04 DIAGNOSIS — Z7984 Long term (current) use of oral hypoglycemic drugs: Secondary | ICD-10-CM | POA: Diagnosis not present

## 2016-07-04 DIAGNOSIS — Z8546 Personal history of malignant neoplasm of prostate: Secondary | ICD-10-CM | POA: Insufficient documentation

## 2016-07-04 DIAGNOSIS — E119 Type 2 diabetes mellitus without complications: Secondary | ICD-10-CM | POA: Insufficient documentation

## 2016-07-04 DIAGNOSIS — R29810 Facial weakness: Secondary | ICD-10-CM | POA: Insufficient documentation

## 2016-07-04 DIAGNOSIS — H21561 Pupillary abnormality, right eye: Secondary | ICD-10-CM | POA: Diagnosis not present

## 2016-07-04 DIAGNOSIS — Z87891 Personal history of nicotine dependence: Secondary | ICD-10-CM | POA: Insufficient documentation

## 2016-07-04 DIAGNOSIS — R531 Weakness: Secondary | ICD-10-CM | POA: Diagnosis not present

## 2016-07-04 DIAGNOSIS — Z7982 Long term (current) use of aspirin: Secondary | ICD-10-CM | POA: Diagnosis not present

## 2016-07-04 DIAGNOSIS — Z79899 Other long term (current) drug therapy: Secondary | ICD-10-CM | POA: Insufficient documentation

## 2016-07-04 LAB — CULTURE, BLOOD (ROUTINE X 2): Culture: NO GROWTH

## 2016-07-04 LAB — I-STAT CHEM 8, ED
BUN: 17 mg/dL (ref 6–20)
CALCIUM ION: 1.14 mmol/L — AB (ref 1.15–1.40)
Chloride: 105 mmol/L (ref 101–111)
Creatinine, Ser: 1.1 mg/dL (ref 0.61–1.24)
Glucose, Bld: 100 mg/dL — ABNORMAL HIGH (ref 65–99)
HCT: 40 % (ref 39.0–52.0)
HEMOGLOBIN: 13.6 g/dL (ref 13.0–17.0)
Potassium: 3.7 mmol/L (ref 3.5–5.1)
SODIUM: 142 mmol/L (ref 135–145)
TCO2: 27 mmol/L (ref 0–100)

## 2016-07-04 LAB — I-STAT TROPONIN, ED: Troponin i, poc: 0.01 ng/mL (ref 0.00–0.08)

## 2016-07-04 NOTE — ED Notes (Signed)
Report given to [facility]. Informed pt will be transported back by [facility]. Spoke with [employer]

## 2016-07-04 NOTE — ED Notes (Signed)
[facility] called to having transported back to [facility]. Family aware.

## 2016-07-04 NOTE — ED Provider Notes (Signed)
[facility] DEPT Provider Note   CSN: [id number] Arrival date & time: 07/04/16  1544     History   Chief Complaint Chief Complaint  Patient presents with  . Eye Problem    HPI Zachary [name] is a 83 y.o. male. Chief complaint is "his eyes are different".  HPI: Any 83-year-old male recently admitted for stroke. Resides at [facility]. He was transferred today according to paramedics stating that he back from physical therapy and nurse looked at his eyes and noticed that one was a lot different than the other one.  He has a history of previous eye surgery.  Recently admitted for CVA. Discharged to [facility] a relatively new there. He went to physical therapy today. He states that they have him "exercising". As he is evaluating by me upon his arrival he uses both upper extremities with vigorous strength to demonstrate exercises that were physical therapy today. He denies any pain or injury or any concerns about himself.  Past Medical History:  Diagnosis Date  . Arthritis   . Carotid artery occlusion   . GERD (gastroesophageal reflux disease)   . Headache    "sometimes monthly" (06/29/2016)  . Heart block   . History of blood transfusion    "while in the service"  . History of stomach ulcers   . Hypertension   . Insomnia   . Pancreatitis   . Prostate cancer ([place])    Archie Endo 09/02/2010  . Prostate enlargement   . PVC's (premature ventricular contractions)   . PVD (peripheral vascular disease) ([place])   . Stroke ([place]) 05/2013; 06/29/2016   Archie Endo 05/24/2013  . Type II diabetes mellitus ([place]) dx'd 2008   Archie Endo 08/19/2010    Patient Active Problem List   Diagnosis Date Noted  . Goals of care, counseling/discussion   . Advance care planning   . Palliative care by specialist   . Stenosis of left carotid artery   . Rhabdomyolysis 06/29/2016  . Stroke-like symptoms 06/29/2016  . [facility]) joint injury, right, (chronic separation) 06/29/2016  . BPH  (benign prostatic hyperplasia) 06/29/2016  . HLD (hyperlipidemia) 06/29/2016  . PAC (premature atrial contraction) 06/29/2016  . Cerebral embolism with cerebral infarction 06/29/2016  . Diabetes mellitus with complication ([place])   . Aortic sclerosis 09/18/2015  . Fatigue 09/18/2015  . Pre-syncope 08/05/2015  . Leukocytosis 08/04/2015  . Syncope 08/04/2015  . Hypogonadism male 08/28/2014  . Diarrhea 08/28/2014  . Screening for prostate cancer 07/30/2014  . Dementia without behavioral disturbance 07/04/2013  . Aftercare following surgery of the circulatory system, [place] 06/19/2013  . Stroke ([place]) 06/04/2013  . Urinary retention 05/29/2013  . Carotid artery disease ([place]) 05/28/2013  . History of right-sided carotid endarterectomy 2015 05/28/2013  . Pulsus bigeminis 05/20/2013  . Diabetes mellitus ([place]) 05/20/2013  . Weakness 05/20/2013  . Acute ischemic stroke ([place]) 05/20/2013  . Postop check 08/06/2011  . Acute cholecystitis 07/19/2011  . Hypertension 07/15/2011  . PVD (peripheral vascular disease) ([place])     Past Surgical History:  Procedure Laterality Date  . CATARACT EXTRACTION W/ INTRAOCULAR LENS  IMPLANT, BILATERAL Bilateral   . CHOLECYSTECTOMY  07/18/2011   Procedure: LAPAROSCOPIC CHOLECYSTECTOMY;  Surgeon: Gwenyth Ober, MD;  Location: [facility];  Service: General;  Laterality: N/A;  . ENDARTERECTOMY Right 05/28/2013   Procedure: ENDARTERECTOMY CAROTID;  Surgeon: Rosetta Posner, MD;  Location: [facility];  Service: Vascular;  Laterality: Right;  . ESOPHAGOGASTRODUODENOSCOPY (EGD) WITH ESOPHAGEAL DILATION  2004; 12/2009   Archie Endo 01/08/2010  .  FLEXIBLE SIGMOIDOSCOPY N/A 09/23/2014   Procedure: FLEXIBLE SIGMOIDOSCOPY;  Surgeon: Garlan Fair, MD;  Location: [facility];  Service: Endoscopy;  Laterality: N/A;  . INSERTION PROSTATE RADIATION SEED  03/1999   Archie Endo 09/02/2010  . PROSTATE BIOPSY  2000   Archie Endo 09/02/2010  . TONSILLECTOMY         Home Medications    Prior to Admission  medications   Medication Sig Start Date End Date Taking? Authorizing Provider  aspirin 325 MG tablet Take 1 tablet (325 mg total) by mouth daily. 07/03/16   Dron Tanna Furry, MD  atorvastatin (LIPITOR) 10 MG tablet Take 1 tablet (10 mg total) by mouth daily. 05/30/13   Marius Ditch, MD  clopidogrel (PLAVIX) 75 MG tablet Take 1 tablet (75 mg total) by mouth daily. 07/03/16   Dron Tanna Furry, MD  ergocalciferol (VITAMIN D2) 50000 UNITS capsule Take 50,000 Units by mouth once a week. Friday    Historical Provider, MD  fluticasone (FLONASE) 50 MCG/ACT nasal spray Place 1 spray into both nostrils daily as needed for allergies or rhinitis.    Historical Provider, MD  glimepiride (AMARYL) 4 MG tablet Take 4 mg by mouth daily with breakfast.    Historical Provider, MD  Memantine HCl ER (NAMENDA XR) 28 MG CP24 Take 28 mg by mouth daily.     Historical Provider, MD  metFORMIN (GLUCOPHAGE) 500 MG tablet Take 500 mg by mouth 2 (two) times daily with a meal.    Historical Provider, MD  Multiple Vitamin (MULTIVITAMIN WITH MINERALS) TABS Take 1 tablet by mouth daily.    Historical Provider, MD  omeprazole (PRILOSEC) 20 MG capsule Take 20 mg by mouth daily.    Historical Provider, MD  pentoxifylline (TRENTAL) 400 MG CR tablet Take 400 mg by mouth 3 (three) times daily with meals.    Historical Provider, MD  potassium chloride 20 MEQ/15ML (10%) SOLN Take 15 mLs (20 mEq total) by mouth daily. 07/03/16   Dron Tanna Furry, MD  sertraline (ZOLOFT) 50 MG tablet Take 50 mg by mouth daily.    Historical Provider, MD  Tamsulosin HCl (FLOMAX) 0.4 MG CAPS Take 0.4 mg by mouth daily.     Historical Provider, MD  zolpidem (AMBIEN) 5 MG tablet Take 5 mg by mouth at bedtime as needed for sleep.    Historical Provider, MD    Family History Family History  Problem Relation Age of Onset  . Breast cancer Daughter   . Cancer Daughter 55    died of cancer   . Stroke Father   . Other      hypogonadism  . Heart  attack Neg Hx   . Hypertension Neg Hx     Social History Social History  Substance Use Topics  . Smoking status: Former Smoker    Packs/day: 3.00    Years: 20.00    Quit date: 04/19/1962  . Smokeless tobacco: Never Used  . Alcohol use No     Allergies   Penicillins   Review of Systems Review of Systems  Constitutional: Negative for appetite change, chills, diaphoresis, fatigue and fever.  HENT: Negative for mouth sores, sore throat and trouble swallowing.   Eyes: Negative for visual disturbance.  Respiratory: Negative for cough, chest tightness, shortness of breath and wheezing.   Cardiovascular: Negative for chest pain.  Gastrointestinal: Negative for abdominal distention, abdominal pain, diarrhea, nausea and vomiting.  Endocrine: Negative for polydipsia, polyphagia and polyuria.  Genitourinary: Negative for dysuria, frequency and hematuria.  Musculoskeletal:  Negative for gait problem.  Skin: Negative for color change, pallor and rash.  Neurological: Negative for dizziness, syncope, light-headedness and headaches.       Patient denies difficulty with his arms or legs. No difficult speech. No difficulty with his vision.  Hematological: Does not bruise/bleed easily.  Psychiatric/Behavioral: Negative for behavioral problems and confusion.     Physical Exam Updated Vital Signs BP (!) 159/65 (BP Location: Left Arm)   Pulse 62   Temp 98.2 F (36.8 C) (Oral)   Resp 16   Ht 5\' 11"  (1.803 m)   Wt 194 lb (88 kg)   SpO2 97%   BMI 27.06 kg/m   Physical Exam  Constitutional: He is oriented to person, place, and time. He appears well-developed and well-nourished. No distress.  HENT:  Head: Normocephalic.  Eyes: Conjunctivae are normal. Pupils are equal, round, and reactive to light. No scleral icterus.  People abnormal and nonreactive. Left pupil 3 mm and reactive. Normal extra ocular movements. Normal cranial nerves.  Neck: Normal range of motion. Neck supple. No  thyromegaly present.  Cardiovascular: Normal rate and regular rhythm.  Exam reveals no gallop and no friction rub.   No murmur heard. Pulmonary/Chest: Effort normal and breath sounds normal. No respiratory distress. He has no wheezes. He has no rales.  Abdominal: Soft. Bowel sounds are normal. He exhibits no distension. There is no tenderness. There is no rebound.  Musculoskeletal: Normal range of motion.  Neurological: He is alert and oriented to person, place, and time.  No obvious strength deficits to the upper or lower extremities.  Skin: Skin is warm and dry. No rash noted.  Psychiatric: He has a normal mood and affect. His behavior is normal.     ED Treatments / Results  Labs (all labs ordered are listed, but only abnormal results are displayed) Labs Reviewed  I-STAT CHEM 8, ED - Abnormal; Notable for the following:       Result Value   Glucose, Bld 100 (*)    Calcium, Ion 1.14 (*)    All other components within normal limits  I-STAT TROPOININ, ED    EKG  EKG Interpretation None       Radiology Ct Head Wo Contrast  Result Date: 07/04/2016 CLINICAL DATA:  Right-sided weakness and right facial droop. EXAM: CT HEAD WITHOUT CONTRAST TECHNIQUE: Contiguous axial images were obtained from the base of the skull through the vertex without intravenous contrast. COMPARISON:  06/29/2016 and 08/04/2015, MRI brain 06/29/2016 FINDINGS: Brain: Ventricles and cisterns are within normal. CSF spaces are within normal. Evidence of chronic ischemic microvascular disease. Small old right posterior parietal/ occipital infarct. No mass, mass effect, shift of midline structures or acute hemorrhage. The small foci of acute ischemia seen on previous MRI are not well seen by CT. Vascular: Calcified plaque over the cavernous segment of the internal carotid arteries and left vertebral artery. Skull: Within normal. Sinuses/Orbits: Within normal. Other: None. IMPRESSION: No acute intracranial findings.  Previously seen small foci of acute ischemic change over the left convexity by MRI are not apparent by CT. Chronic ischemic microvascular disease. Small old right posterior parietal/ occipital infarct. Electronically Signed   By: Marin Olp M.D.   On: 07/04/2016 16:08    Procedures Procedures (including critical care time)  Medications Ordered in ED Medications - No data to display   Initial Impression / Assessment and Plan / ED Course  I have reviewed the triage vital signs and the nursing notes.  Pertinent labs &  imaging results that were available during my care of the patient were reviewed by me and considered in my medical decision making (see chart for details).     Patient brought in as "code stroke". Take immediately to CT scan. No obvious abnormalities noted on CT scan. Seen by neurology. He felt no further evaluation need to ensues we are in agreement that this was concern over his irregular I which is a postsurgical chronic problem. His neurological exam is unchanged.  Final Clinical Impressions(s) / ED Diagnoses   Final diagnoses:  Abnormal shape of right pupil    New Prescriptions New Prescriptions   No medications on file     Tanna Furry, MD 07/04/16 1624

## 2016-07-04 NOTE — ED Triage Notes (Signed)
Pt arrives from [facility] via [facility] after noticing his right pupil appears abnormal to them. On arrival to ED was seen by edp and cleared for CT. On arrival to CT stroke team arrival and canceled code stroke due to pt having history of right eye surgery and per MD "not abnormal". Pt is alert and ox4. Pt has right sided weakness with history of same from previous.

## 2016-07-04 NOTE — Discharge Instructions (Signed)
Zachary [name] was seen in ER by ER physician and Neurologist. No signs or symptoms of stroke. No changes on Ct. Right eye is abnormal in shape because of previous surgery.

## 2016-07-04 NOTE — Consult Note (Signed)
I will not charge for this note Pt briefly seen in the ED. Brought by EMS because the staff at the nursing home looked at his right pupil and though it was bigger than the left and sent him or this reason. Pt has no complains.  He is jovial and joking around.  He tells me that he had surgery for cataracts in that eye. I discussed with ED attending and pt is to be sent back to the rehab place.

## 2016-07-04 NOTE — ED Notes (Signed)
Family at bedside. 

## 2016-07-06 ENCOUNTER — Encounter: Payer: Self-pay | Admitting: Internal Medicine

## 2016-07-06 ENCOUNTER — Non-Acute Institutional Stay (SKILLED_NURSING_FACILITY): Payer: Medicare Other | Admitting: Internal Medicine

## 2016-07-06 ENCOUNTER — Other Ambulatory Visit: Payer: Self-pay

## 2016-07-06 DIAGNOSIS — E118 Type 2 diabetes mellitus with unspecified complications: Secondary | ICD-10-CM | POA: Diagnosis not present

## 2016-07-06 DIAGNOSIS — I6522 Occlusion and stenosis of left carotid artery: Secondary | ICD-10-CM | POA: Diagnosis not present

## 2016-07-06 DIAGNOSIS — I639 Cerebral infarction, unspecified: Secondary | ICD-10-CM

## 2016-07-06 NOTE — Assessment & Plan Note (Signed)
Outpatient follow-up with Dr. [name] in early 4/18

## 2016-07-06 NOTE — Progress Notes (Signed)
Facility Location: [facility]  Room Number:311  Code Status: DNR  PCP: [name], MD Zachary [name]  [address] [place] [address]   This is a comprehensive admission note to [facility] performed on this date less than 30 days from date of admission. Included are preadmission medical/surgical history;reconciled medication list; family history; social history and comprehensive review of systems.  Corrections and additions to the records were documented . Comprehensive physical exam was also performed. Additionally a clinical summary was entered for each active diagnosis pertinent to this admission in the Problem List to enhance continuity of care.  HPI: The patient is hospitalized 3/14-3/16/18 with an acute ischemic stroke in the left MCA distribution. Apparently patient had an unwitnessed fall with slurred speech and weakness in the right upper extremity documented on exam. Fall was complicated by rhabdomyolysis with CK levels up to 4388.  MRI revealed multiple small foci of acute ischemia within the left hemisphere predominantly within the left MCA distribution. Neurology recommended  full dose aspirin and adding Plavix for 3 months. Plavix alone would be continued after roughly mid June. He is felt to have symptomatic left internal carotid artery stenosis and a follow-up with Dr. [name] 1-2 weeks post discharge for possible carotid endarterectomy was being considered. Post recent labs were 07/04/16; glucose was 100.  Past medical and surgical history: Includes diabetes, stroke, peripheral vascular disease, prostate cancer, pancreatitis, hypertension, GERD and history of ulcers.  He also has history of dementia. Chronic leukocytosis has been evaluated by hematology. Pertinent surgeries include prostate seeding, esophageal dilation, cholecystectomy, and endarterectomy.  Social history: Former smoker up to 3 packs a day. Does not drink  alcohol.  Family history: Reviewed, noncontributory.  Review of systems:Invalid due to dementia. Date given as March or April, 2024. Review of systems was negative except for a nonproductive cough. He denies any dysphagia although there were bullae of partially chewed meat noted on the bedside table. He denies any claudication. He is on Trental Constitutional: No fever,significant weight change, fatigue  Eyes: No redness, discharge, pain, vision change ENT/mouth: No nasal congestion,  purulent discharge, earache,change in hearing ,sore throat  Cardiovascular: No chest pain, palpitations,paroxysmal nocturnal dyspnea, claudication, edema  Respiratory: No  sputum production,hemoptysis, DOE , significant snoring,apnea  Gastrointestinal: No heartburn,abdominal pain, nausea / vomiting,rectal bleeding, melena,change in bowels Genitourinary: No dysuria,hematuria, pyuria,  incontinence, nocturia Musculoskeletal: No joint stiffness, joint swelling, weakness,pain Dermatologic: No rash, pruritus, change in appearance of skin Neurologic: No dizziness,headache,syncope, seizures, numbness , tingling Psychiatric: No significant anxiety , depression, insomnia, anorexia Endocrine: No change in hair/skin/ nails, excessive thirst, excessive hunger, excessive urination  Hematologic/lymphatic: No significant bruising, lymphadenopathy,abnormal bleeding Allergy/immunology: No itchy/ watery eyes, significant sneezing, urticaria, angioedema  Physical exam:  Pertinent or positive findings: There is striking anisocoria due to post op distortion of the right pupil. Heart rhythm is irregular. Pedal pulses are decreased. He has extensive keratoses and bruising over the upper extremities. He is surprisingly strong with no definite asymmetry of strength of the extremities. He does have difficulty sitting up by himself.   General appearance:Adequately nourished; no acute distress , increased work of breathing is present.     Lymphatic: No lymphadenopathy about the head, neck, axilla . Eyes: No conjunctival inflammation or lid edema is present. There is no scleral icterus. Ears:  External ear exam shows no significant lesions or deformities.   Nose:  External nasal examination shows no deformity or inflammation. Nasal mucosa are  pink and moist without lesions ,exudates Oral exam: lips and gums are healthy appearing.There is no oropharyngeal erythema or exudate . Neck:  No thyromegaly, masses, tenderness noted.    Heart:  No gallop, murmur, click, rub .  Lungs:Chest clear to auscultation without wheezes, rhonchi,rales , rubs. Abdomen:Bowel sounds are normal. Abdomen is soft and nontender with no organomegaly, hernias,masses. GU: deferred  Extremities:  No cyanosis, clubbing,edema  Neurologic exam : Balance,Rhomberg,finger to nose testing could not be completed due to clinical state Deep tendon reflexes are equal Skin: Warm & dry w/o tenting. No significant lesions or rash.  See clinical summary under each active problem in the Problem List with associated updated therapeutic plan

## 2016-07-06 NOTE — Patient Instructions (Signed)
See assessment and plan under each diagnosis in the problem list and acutely for this visit 

## 2016-07-06 NOTE — Assessment & Plan Note (Signed)
Decrease Amaryl as A1c is only 6.6% ; this agent has high risk of hypoglycemia A1c goal would be less than 8

## 2016-07-06 NOTE — Assessment & Plan Note (Signed)
Assess need for Trental

## 2016-07-08 ENCOUNTER — Encounter (HOSPITAL_COMMUNITY): Payer: Self-pay | Admitting: *Deleted

## 2016-07-08 NOTE — Progress Notes (Signed)
I spoke to Mr [facility] nurse Malachi Paradise and his daughter Marcin Holte and instructions given and history updated.

## 2016-07-08 NOTE — Pre-Procedure Instructions (Addendum)
    LANI MENDIOLA  07/08/2016   Your procedure is scheduled on Friday, March 23.  Report to [facility] Admitting at 5:30 AM   Call this number if you have problems the morning of surgery: [phone]    Remember:  Do not eat food or drink liquids after midnight.  Take these medicines the morning of surgery with A SIP OF WATER Namenda, Sertraline.  Aspirin and Plavix as instructed by Dr [name].  WHAT ABOUT MY DIABETES MEDICATION?  Marland Kitchen Do not take oral diabetes medicines (pills) the morning of surgery.  Morning of Surgery:  . If your blood sugar is less than 70 mg/dL, you will need to treat for low blood sugar: o Treat a low blood sugar (less than 70 mg/dL)4 glucose tablets, OR glucose gel. o Recheck blood sugar in 15 minutes after treatment (to make sure it is greater than 70 mg/dL). If your blood sugar is not greater than 70 mg/dL on recheck, call [phone] for further instructions. . Report your blood sugar to the short stay nurse when you get to Short Stay.  Please Send Medication Record with Administered Medications Documented

## 2016-07-09 ENCOUNTER — Encounter (HOSPITAL_COMMUNITY): Payer: Self-pay | Admitting: *Deleted

## 2016-07-09 ENCOUNTER — Encounter (HOSPITAL_COMMUNITY)
Admission: RE | Disposition: A | Discharge status: Skilled Nursing Facility | Payer: Self-pay | Source: Ambulatory Visit | Attending: Vascular Surgery

## 2016-07-09 ENCOUNTER — Inpatient Hospital Stay (HOSPITAL_COMMUNITY)
Admission: RE | Admit: 2016-07-09 | Discharge: 2016-07-12 | DRG: 039 | Disposition: A | Discharge status: Skilled Nursing Facility | Payer: Medicare Other | Source: Ambulatory Visit | Attending: Vascular Surgery | Admitting: Vascular Surgery

## 2016-07-09 ENCOUNTER — Inpatient Hospital Stay (HOSPITAL_COMMUNITY): Payer: Medicare Other | Admitting: Emergency Medicine

## 2016-07-09 DIAGNOSIS — Z8546 Personal history of malignant neoplasm of prostate: Secondary | ICD-10-CM | POA: Diagnosis not present

## 2016-07-09 DIAGNOSIS — K219 Gastro-esophageal reflux disease without esophagitis: Secondary | ICD-10-CM | POA: Diagnosis present

## 2016-07-09 DIAGNOSIS — R488 Other symbolic dysfunctions: Secondary | ICD-10-CM | POA: Diagnosis not present

## 2016-07-09 DIAGNOSIS — W19XXXA Unspecified fall, initial encounter: Secondary | ICD-10-CM | POA: Diagnosis present

## 2016-07-09 DIAGNOSIS — Z48812 Encounter for surgical aftercare following surgery on the circulatory system: Secondary | ICD-10-CM | POA: Diagnosis not present

## 2016-07-09 DIAGNOSIS — N400 Enlarged prostate without lower urinary tract symptoms: Secondary | ICD-10-CM | POA: Diagnosis not present

## 2016-07-09 DIAGNOSIS — Z880 Allergy status to penicillin: Secondary | ICD-10-CM | POA: Diagnosis not present

## 2016-07-09 DIAGNOSIS — I6529 Occlusion and stenosis of unspecified carotid artery: Secondary | ICD-10-CM | POA: Diagnosis present

## 2016-07-09 DIAGNOSIS — Z8711 Personal history of peptic ulcer disease: Secondary | ICD-10-CM

## 2016-07-09 DIAGNOSIS — R55 Syncope and collapse: Secondary | ICD-10-CM | POA: Diagnosis not present

## 2016-07-09 DIAGNOSIS — E1151 Type 2 diabetes mellitus with diabetic peripheral angiopathy without gangrene: Secondary | ICD-10-CM | POA: Diagnosis present

## 2016-07-09 DIAGNOSIS — Z87891 Personal history of nicotine dependence: Secondary | ICD-10-CM | POA: Diagnosis not present

## 2016-07-09 DIAGNOSIS — Z79899 Other long term (current) drug therapy: Secondary | ICD-10-CM | POA: Diagnosis not present

## 2016-07-09 DIAGNOSIS — I10 Essential (primary) hypertension: Secondary | ICD-10-CM | POA: Diagnosis not present

## 2016-07-09 DIAGNOSIS — Z8673 Personal history of transient ischemic attack (TIA), and cerebral infarction without residual deficits: Secondary | ICD-10-CM | POA: Diagnosis not present

## 2016-07-09 DIAGNOSIS — Z823 Family history of stroke: Secondary | ICD-10-CM

## 2016-07-09 DIAGNOSIS — R4781 Slurred speech: Secondary | ICD-10-CM | POA: Diagnosis present

## 2016-07-09 DIAGNOSIS — E7800 Pure hypercholesterolemia, unspecified: Secondary | ICD-10-CM | POA: Diagnosis not present

## 2016-07-09 DIAGNOSIS — I69391 Dysphagia following cerebral infarction: Secondary | ICD-10-CM | POA: Diagnosis not present

## 2016-07-09 DIAGNOSIS — G464 Cerebellar stroke syndrome: Secondary | ICD-10-CM | POA: Diagnosis not present

## 2016-07-09 DIAGNOSIS — I6522 Occlusion and stenosis of left carotid artery: Secondary | ICD-10-CM | POA: Diagnosis not present

## 2016-07-09 DIAGNOSIS — I639 Cerebral infarction, unspecified: Secondary | ICD-10-CM | POA: Diagnosis not present

## 2016-07-09 DIAGNOSIS — Z7902 Long term (current) use of antithrombotics/antiplatelets: Secondary | ICD-10-CM | POA: Diagnosis not present

## 2016-07-09 DIAGNOSIS — F0390 Unspecified dementia without behavioral disturbance: Secondary | ICD-10-CM | POA: Diagnosis present

## 2016-07-09 DIAGNOSIS — I69398 Other sequelae of cerebral infarction: Secondary | ICD-10-CM | POA: Diagnosis not present

## 2016-07-09 DIAGNOSIS — I2510 Atherosclerotic heart disease of native coronary artery without angina pectoris: Secondary | ICD-10-CM | POA: Diagnosis not present

## 2016-07-09 DIAGNOSIS — Z7982 Long term (current) use of aspirin: Secondary | ICD-10-CM | POA: Diagnosis not present

## 2016-07-09 DIAGNOSIS — E785 Hyperlipidemia, unspecified: Secondary | ICD-10-CM | POA: Diagnosis not present

## 2016-07-09 DIAGNOSIS — I69232 Monoplegia of upper limb following other nontraumatic intracranial hemorrhage affecting left dominant side: Secondary | ICD-10-CM | POA: Diagnosis not present

## 2016-07-09 DIAGNOSIS — I69328 Other speech and language deficits following cerebral infarction: Secondary | ICD-10-CM | POA: Diagnosis not present

## 2016-07-09 DIAGNOSIS — M6281 Muscle weakness (generalized): Secondary | ICD-10-CM | POA: Diagnosis not present

## 2016-07-09 DIAGNOSIS — R1312 Dysphagia, oropharyngeal phase: Secondary | ICD-10-CM | POA: Diagnosis not present

## 2016-07-09 DIAGNOSIS — I63232 Cerebral infarction due to unspecified occlusion or stenosis of left carotid arteries: Secondary | ICD-10-CM | POA: Diagnosis not present

## 2016-07-09 DIAGNOSIS — S43101D Unspecified dislocation of right acromioclavicular joint, subsequent encounter: Secondary | ICD-10-CM | POA: Diagnosis not present

## 2016-07-09 DIAGNOSIS — I491 Atrial premature depolarization: Secondary | ICD-10-CM | POA: Diagnosis not present

## 2016-07-09 DIAGNOSIS — Z9181 History of falling: Secondary | ICD-10-CM | POA: Diagnosis not present

## 2016-07-09 HISTORY — PX: ENDARTERECTOMY: SHX5162

## 2016-07-09 HISTORY — DX: Rhabdomyolysis: M62.82

## 2016-07-09 HISTORY — PX: PATCH ANGIOPLASTY: SHX6230

## 2016-07-09 LAB — POCT I-STAT 7, (LYTES, BLD GAS, ICA,H+H)
ACID-BASE EXCESS: 3 mmol/L — AB (ref 0.0–2.0)
ACID-BASE EXCESS: 5 mmol/L — AB (ref 0.0–2.0)
BICARBONATE: 27.4 mmol/L (ref 20.0–28.0)
BICARBONATE: 29.7 mmol/L — AB (ref 20.0–28.0)
CALCIUM ION: 1.11 mmol/L — AB (ref 1.15–1.40)
Calcium, Ion: 1.13 mmol/L — ABNORMAL LOW (ref 1.15–1.40)
HCT: 32 % — ABNORMAL LOW (ref 39.0–52.0)
HCT: 35 % — ABNORMAL LOW (ref 39.0–52.0)
HEMOGLOBIN: 10.9 g/dL — AB (ref 13.0–17.0)
Hemoglobin: 11.9 g/dL — ABNORMAL LOW (ref 13.0–17.0)
O2 SAT: 100 %
O2 Saturation: 100 %
PCO2 ART: 42 mmHg (ref 32.0–48.0)
PH ART: 7.457 — AB (ref 7.350–7.450)
PO2 ART: 491 mmHg — AB (ref 83.0–108.0)
POTASSIUM: 3.6 mmol/L (ref 3.5–5.1)
Potassium: 3.8 mmol/L (ref 3.5–5.1)
SODIUM: 142 mmol/L (ref 135–145)
Sodium: 141 mmol/L (ref 135–145)
TCO2: 29 mmol/L (ref 0–100)
TCO2: 31 mmol/L (ref 0–100)
pCO2 arterial: 37.8 mmHg (ref 32.0–48.0)
pH, Arterial: 7.463 — ABNORMAL HIGH (ref 7.350–7.450)
pO2, Arterial: 418 mmHg — ABNORMAL HIGH (ref 83.0–108.0)

## 2016-07-09 LAB — CBC
HCT: 42 % (ref 39.0–52.0)
HCT: 28.8 % — ABNORMAL LOW (ref 39.0–52.0)
HEMOGLOBIN: 14 g/dL (ref 13.0–17.0)
Hemoglobin: 9.7 g/dL — ABNORMAL LOW (ref 13.0–17.0)
MCH: 31.3 pg (ref 26.0–34.0)
MCH: 31.4 pg (ref 26.0–34.0)
MCHC: 33.3 g/dL (ref 30.0–36.0)
MCHC: 33.7 g/dL (ref 30.0–36.0)
MCV: 93.8 fL (ref 78.0–100.0)
MCV: 93.2 fL (ref 78.0–100.0)
PLATELETS: 226 10*3/uL (ref 150–400)
Platelets: 293 10*3/uL (ref 150–400)
RBC: 4.48 MIL/uL (ref 4.22–5.81)
RBC: 3.09 MIL/uL — ABNORMAL LOW (ref 4.22–5.81)
RDW: 13.3 % (ref 11.5–15.5)
RDW: 13.3 % (ref 11.5–15.5)
WBC: 12.8 10*3/uL — ABNORMAL HIGH (ref 4.0–10.5)
WBC: 17.7 10*3/uL — ABNORMAL HIGH (ref 4.0–10.5)

## 2016-07-09 LAB — ABO/RH: ABO/RH(D): O POS

## 2016-07-09 LAB — TYPE AND SCREEN
ABO/RH(D): O POS
Antibody Screen: NEGATIVE

## 2016-07-09 LAB — GLUCOSE, CAPILLARY
GLUCOSE-CAPILLARY: 106 mg/dL — AB (ref 65–99)
Glucose-Capillary: 89 mg/dL (ref 65–99)
Glucose-Capillary: 148 mg/dL — ABNORMAL HIGH (ref 65–99)
Glucose-Capillary: 179 mg/dL — ABNORMAL HIGH (ref 65–99)

## 2016-07-09 LAB — BASIC METABOLIC PANEL
ANION GAP: 10 (ref 5–15)
BUN: 13 mg/dL (ref 6–20)
CHLORIDE: 103 mmol/L (ref 101–111)
CO2: 27 mmol/L (ref 22–32)
Calcium: 8.9 mg/dL (ref 8.9–10.3)
Creatinine, Ser: 1.13 mg/dL (ref 0.61–1.24)
GFR, EST NON AFRICAN AMERICAN: 54 mL/min — AB (ref >60)
Glucose, Bld: 101 mg/dL — ABNORMAL HIGH (ref 65–99)
POTASSIUM: 3.9 mmol/L (ref 3.5–5.1)
SODIUM: 140 mmol/L (ref 135–145)

## 2016-07-09 LAB — CK: Total CK: 105 U/L (ref 49–397)

## 2016-07-09 LAB — MRSA PCR SCREENING: MRSA by PCR: NEGATIVE

## 2016-07-09 SURGERY — ENDARTERECTOMY, CAROTID
Anesthesia: General | Site: Neck | Laterality: Left

## 2016-07-09 MED ORDER — PANTOPRAZOLE SODIUM 40 MG PO TBEC
40.0000 mg | DELAYED_RELEASE_TABLET | Freq: Every day | ORAL | Status: DC
  Administered 2016-07-09 – 2016-07-12 (×4): 40 mg via ORAL
  Filled 2016-07-09 (×4): qty 1

## 2016-07-09 MED ORDER — ALUM & MAG HYDROXIDE-SIMETH 200-200-20 MG/5ML PO SUSP: 15.0000 mL | ORAL | Status: DC | PRN

## 2016-07-09 MED ORDER — MAGNESIUM SULFATE 2 GM/50ML IV SOLN
2.0000 g | Freq: Every day | INTRAVENOUS | Status: DC | PRN
  Filled 2016-07-09: qty 50

## 2016-07-09 MED ORDER — SODIUM CHLORIDE 0.9 % IV SOLN: INTRAVENOUS | Status: DC

## 2016-07-09 MED ORDER — BISACODYL 5 MG PO TBEC: 5.0000 mg | DELAYED_RELEASE_TABLET | Freq: Every day | ORAL | Status: DC | PRN

## 2016-07-09 MED ORDER — HYDROMORPHONE HCL 1 MG/ML IJ SOLN: 0.2500 mg | INTRAMUSCULAR | Status: DC | PRN

## 2016-07-09 MED ORDER — SENNOSIDES-DOCUSATE SODIUM 8.6-50 MG PO TABS: 1.0000 | ORAL_TABLET | Freq: Every evening | ORAL | Status: DC | PRN

## 2016-07-09 MED ORDER — MAGNESIUM HYDROXIDE 400 MG/5ML PO SUSP: 30.0000 mL | Freq: Every day | ORAL | Status: DC | PRN

## 2016-07-09 MED ORDER — LIDOCAINE HCL (PF) 1 % IJ SOLN
INTRAMUSCULAR | Status: AC
  Filled 2016-07-09: qty 30

## 2016-07-09 MED ORDER — OXYCODONE-ACETAMINOPHEN 5-325 MG PO TABS
1.0000 | ORAL_TABLET | ORAL | Status: DC | PRN
  Filled 2016-07-09: qty 2

## 2016-07-09 MED ORDER — ESMOLOL HCL 100 MG/10ML IV SOLN
INTRAVENOUS | Status: AC
  Filled 2016-07-09: qty 10

## 2016-07-09 MED ORDER — VANCOMYCIN HCL IN DEXTROSE 1-5 GM/200ML-% IV SOLN
1000.0000 mg | INTRAVENOUS | Status: AC
  Administered 2016-07-09: 1000 mg via INTRAVENOUS
  Filled 2016-07-09: qty 200

## 2016-07-09 MED ORDER — ETOMIDATE 2 MG/ML IV SOLN
INTRAVENOUS | Status: DC | PRN
  Administered 2016-07-09: 14 mg via INTRAVENOUS

## 2016-07-09 MED ORDER — PHENYLEPHRINE 40 MCG/ML (10ML) SYRINGE FOR IV PUSH (FOR BLOOD PRESSURE SUPPORT)
PREFILLED_SYRINGE | INTRAVENOUS | Status: AC
  Filled 2016-07-09: qty 10

## 2016-07-09 MED ORDER — ROCURONIUM BROMIDE 50 MG/5ML IV SOSY
PREFILLED_SYRINGE | INTRAVENOUS | Status: AC
  Filled 2016-07-09: qty 5

## 2016-07-09 MED ORDER — FENTANYL CITRATE (PF) 250 MCG/5ML IJ SOLN
INTRAMUSCULAR | Status: AC
  Filled 2016-07-09: qty 5

## 2016-07-09 MED ORDER — TAMSULOSIN HCL 0.4 MG PO CAPS
0.4000 mg | ORAL_CAPSULE | Freq: Every day | ORAL | Status: DC
  Administered 2016-07-09 – 2016-07-11 (×3): 0.4 mg via ORAL
  Filled 2016-07-09 (×3): qty 1

## 2016-07-09 MED ORDER — ALBUMIN HUMAN 5 % IV SOLN
INTRAVENOUS | Status: DC | PRN
  Administered 2016-07-09 (×3): via INTRAVENOUS

## 2016-07-09 MED ORDER — ONDANSETRON HCL 4 MG/2ML IJ SOLN
INTRAMUSCULAR | Status: AC
  Filled 2016-07-09: qty 2

## 2016-07-09 MED ORDER — LACTATED RINGERS IV SOLN
INTRAVENOUS | Status: DC | PRN
  Administered 2016-07-09 (×2): via INTRAVENOUS

## 2016-07-09 MED ORDER — CHLORHEXIDINE GLUCONATE CLOTH 2 % EX PADS: 6.0000 | MEDICATED_PAD | Freq: Once | CUTANEOUS | Status: DC

## 2016-07-09 MED ORDER — POTASSIUM CHLORIDE 20 MEQ/15ML (10%) PO SOLN
20.0000 meq | Freq: Every day | ORAL | Status: DC
  Administered 2016-07-10 – 2016-07-12 (×3): 20 meq via ORAL
  Filled 2016-07-09 (×3): qty 15

## 2016-07-09 MED ORDER — VANCOMYCIN HCL IN DEXTROSE 1-5 GM/200ML-% IV SOLN
1000.0000 mg | INTRAVENOUS | Status: AC
  Administered 2016-07-09: 1000 mg via INTRAVENOUS
  Filled 2016-07-09 (×2): qty 200

## 2016-07-09 MED ORDER — EPHEDRINE SULFATE 50 MG/ML IJ SOLN
INTRAMUSCULAR | Status: DC | PRN
  Administered 2016-07-09: 10 mg via INTRAVENOUS
  Administered 2016-07-09: 2.5 mg via INTRAVENOUS
  Administered 2016-07-09 (×7): 5 mg via INTRAVENOUS
  Administered 2016-07-09: 2.5 mg via INTRAVENOUS

## 2016-07-09 MED ORDER — PROMETHAZINE HCL 25 MG/ML IJ SOLN: 6.2500 mg | INTRAMUSCULAR | Status: DC | PRN

## 2016-07-09 MED ORDER — SERTRALINE HCL 50 MG PO TABS
50.0000 mg | ORAL_TABLET | Freq: Every day | ORAL | Status: DC
  Administered 2016-07-10 – 2016-07-12 (×3): 50 mg via ORAL
  Filled 2016-07-09 (×3): qty 1

## 2016-07-09 MED ORDER — BISACODYL 10 MG RE SUPP: 10.0000 mg | Freq: Every day | RECTAL | Status: DC | PRN

## 2016-07-09 MED ORDER — FLUTICASONE PROPIONATE 50 MCG/ACT NA SUSP
1.0000 | Freq: Every day | NASAL | Status: DC | PRN
  Filled 2016-07-09: qty 16

## 2016-07-09 MED ORDER — ONDANSETRON HCL 4 MG/2ML IJ SOLN: 4.0000 mg | Freq: Four times a day (QID) | INTRAMUSCULAR | Status: DC | PRN

## 2016-07-09 MED ORDER — LACTATED RINGERS IV SOLN
INTRAVENOUS | Status: DC | PRN
  Administered 2016-07-09: 07:00:00 via INTRAVENOUS

## 2016-07-09 MED ORDER — POTASSIUM CHLORIDE CRYS ER 20 MEQ PO TBCR: 20.0000 meq | EXTENDED_RELEASE_TABLET | Freq: Every day | ORAL | Status: DC | PRN

## 2016-07-09 MED ORDER — SODIUM CHLORIDE 0.9 % IV SOLN
500.0000 mL | Freq: Once | INTRAVENOUS | Status: AC | PRN
  Administered 2016-07-09: 500 mL via INTRAVENOUS

## 2016-07-09 MED ORDER — ASPIRIN 325 MG PO TABS
325.0000 mg | ORAL_TABLET | Freq: Every day | ORAL | Status: DC
  Administered 2016-07-10 – 2016-07-12 (×3): 325 mg via ORAL
  Filled 2016-07-09 (×3): qty 1

## 2016-07-09 MED ORDER — METFORMIN HCL 500 MG PO TABS
500.0000 mg | ORAL_TABLET | Freq: Two times a day (BID) | ORAL | Status: DC
  Administered 2016-07-09 – 2016-07-12 (×6): 500 mg via ORAL
  Filled 2016-07-09 (×6): qty 1

## 2016-07-09 MED ORDER — MEMANTINE HCL ER 28 MG PO CP24
28.0000 mg | ORAL_CAPSULE | Freq: Every day | ORAL | Status: DC
  Administered 2016-07-10 – 2016-07-12 (×3): 28 mg via ORAL
  Filled 2016-07-09 (×4): qty 1

## 2016-07-09 MED ORDER — ROCURONIUM BROMIDE 100 MG/10ML IV SOLN
INTRAVENOUS | Status: DC | PRN
  Administered 2016-07-09: 10 mg via INTRAVENOUS
  Administered 2016-07-09: 5 mg via INTRAVENOUS
  Administered 2016-07-09: 50 mg via INTRAVENOUS
  Administered 2016-07-09: 5 mg via INTRAVENOUS
  Administered 2016-07-09: 10 mg via INTRAVENOUS
  Administered 2016-07-09: 20 mg via INTRAVENOUS
  Administered 2016-07-09: 5 mg via INTRAVENOUS

## 2016-07-09 MED ORDER — LIDOCAINE 2% (20 MG/ML) 5 ML SYRINGE
INTRAMUSCULAR | Status: AC
  Filled 2016-07-09: qty 5

## 2016-07-09 MED ORDER — LIDOCAINE HCL (CARDIAC) 20 MG/ML IV SOLN
INTRAVENOUS | Status: DC | PRN
  Administered 2016-07-09: 100 mg via INTRAVENOUS

## 2016-07-09 MED ORDER — ZOLPIDEM TARTRATE 5 MG PO TABS
5.0000 mg | ORAL_TABLET | Freq: Every evening | ORAL | Status: DC | PRN
  Administered 2016-07-10: 5 mg via ORAL
  Filled 2016-07-09 (×2): qty 1

## 2016-07-09 MED ORDER — CLOPIDOGREL BISULFATE 75 MG PO TABS
75.0000 mg | ORAL_TABLET | Freq: Every day | ORAL | Status: DC
  Administered 2016-07-10 – 2016-07-12 (×3): 75 mg via ORAL
  Filled 2016-07-09 (×3): qty 1

## 2016-07-09 MED ORDER — SODIUM CHLORIDE 0.9 % IV SOLN
INTRAVENOUS | Status: DC | PRN
  Administered 2016-07-09: 09:00:00

## 2016-07-09 MED ORDER — PHENOL 1.4 % MT LIQD
1.0000 | OROMUCOSAL | Status: DC | PRN
Start: 2016-07-09 — End: 2016-07-12

## 2016-07-09 MED ORDER — GUAIFENESIN-DM 100-10 MG/5ML PO SYRP: 15.0000 mL | ORAL_SOLUTION | ORAL | Status: DC | PRN

## 2016-07-09 MED ORDER — LABETALOL HCL 5 MG/ML IV SOLN
10.0000 mg | INTRAVENOUS | Status: DC | PRN
Start: 2016-07-09 — End: 2016-07-12

## 2016-07-09 MED ORDER — SUGAMMADEX SODIUM 200 MG/2ML IV SOLN
INTRAVENOUS | Status: DC | PRN
  Administered 2016-07-09: 100 mg via INTRAVENOUS

## 2016-07-09 MED ORDER — ADULT MULTIVITAMIN W/MINERALS CH
1.0000 | ORAL_TABLET | Freq: Every day | ORAL | Status: DC
  Administered 2016-07-09 – 2016-07-12 (×4): 1 via ORAL
  Filled 2016-07-09 (×4): qty 1

## 2016-07-09 MED ORDER — PHENYLEPHRINE HCL 10 MG/ML IJ SOLN
INTRAMUSCULAR | Status: DC | PRN
  Administered 2016-07-09: 80 ug via INTRAVENOUS

## 2016-07-09 MED ORDER — ENOXAPARIN SODIUM 30 MG/0.3ML ~~LOC~~ SOLN
30.0000 mg | SUBCUTANEOUS | Status: DC
Start: 2016-07-10 — End: 2016-07-12
  Administered 2016-07-10 – 2016-07-11 (×2): 30 mg via SUBCUTANEOUS
  Filled 2016-07-09 (×2): qty 0.3

## 2016-07-09 MED ORDER — FLEET ENEMA 7-19 GM/118ML RE ENEM: 1.0000 | ENEMA | Freq: Once | RECTAL | Status: DC | PRN

## 2016-07-09 MED ORDER — ATORVASTATIN CALCIUM 10 MG PO TABS
10.0000 mg | ORAL_TABLET | Freq: Every day | ORAL | Status: DC
  Administered 2016-07-09 – 2016-07-11 (×3): 10 mg via ORAL
  Filled 2016-07-09 (×3): qty 1

## 2016-07-09 MED ORDER — SODIUM CHLORIDE 0.9 % IV SOLN
INTRAVENOUS | Status: DC
  Administered 2016-07-09: 17:00:00 via INTRAVENOUS

## 2016-07-09 MED ORDER — PROPOFOL 10 MG/ML IV BOLUS
INTRAVENOUS | Status: DC | PRN
  Administered 2016-07-09: 30 mg via INTRAVENOUS

## 2016-07-09 MED ORDER — PHENYLEPHRINE HCL 10 MG/ML IJ SOLN
INTRAMUSCULAR | Status: DC | PRN
  Administered 2016-07-09: 20 ug/min via INTRAVENOUS

## 2016-07-09 MED ORDER — EPHEDRINE 5 MG/ML INJ
INTRAVENOUS | Status: AC
  Filled 2016-07-09: qty 10

## 2016-07-09 MED ORDER — GLIMEPIRIDE 4 MG PO TABS
4.0000 mg | ORAL_TABLET | Freq: Every day | ORAL | Status: DC
  Administered 2016-07-10 – 2016-07-12 (×3): 4 mg via ORAL
  Filled 2016-07-09 (×3): qty 1

## 2016-07-09 MED ORDER — METOPROLOL TARTRATE 5 MG/5ML IV SOLN: 2.0000 mg | INTRAVENOUS | Status: DC | PRN

## 2016-07-09 MED ORDER — MORPHINE SULFATE (PF) 2 MG/ML IV SOLN
1.0000 mg | INTRAVENOUS | Status: DC | PRN
  Administered 2016-07-09: 2 mg via INTRAVENOUS
  Filled 2016-07-09: qty 1

## 2016-07-09 MED ORDER — ATROPINE SULFATE 0.4 MG/ML IJ SOLN
INTRAMUSCULAR | Status: DC | PRN
  Administered 2016-07-09: .36 mg via INTRAVENOUS

## 2016-07-09 MED ORDER — SODIUM CHLORIDE 0.9 % IV SOLN
0.0000 ug/min | INTRAVENOUS | Status: DC
  Filled 2016-07-09: qty 1

## 2016-07-09 MED ORDER — PROTAMINE SULFATE 10 MG/ML IV SOLN
INTRAVENOUS | Status: DC | PRN
  Administered 2016-07-09 (×2): 50 mg via INTRAVENOUS

## 2016-07-09 MED ORDER — DOCUSATE SODIUM 100 MG PO CAPS
100.0000 mg | ORAL_CAPSULE | Freq: Every day | ORAL | Status: DC
  Administered 2016-07-10 – 2016-07-12 (×3): 100 mg via ORAL
  Filled 2016-07-09 (×3): qty 1

## 2016-07-09 MED ORDER — HYDRALAZINE HCL 20 MG/ML IJ SOLN: 5.0000 mg | INTRAMUSCULAR | Status: DC | PRN

## 2016-07-09 MED ORDER — PROTAMINE SULFATE 10 MG/ML IV SOLN
INTRAVENOUS | Status: AC
  Filled 2016-07-09: qty 10

## 2016-07-09 MED ORDER — PROPOFOL 10 MG/ML IV BOLUS
INTRAVENOUS | Status: AC
  Filled 2016-07-09: qty 20

## 2016-07-09 MED ORDER — VANCOMYCIN HCL IN DEXTROSE 1-5 GM/200ML-% IV SOLN
1000.0000 mg | Freq: Two times a day (BID) | INTRAVENOUS | Status: DC
  Filled 2016-07-09: qty 200

## 2016-07-09 MED ORDER — SUGAMMADEX SODIUM 200 MG/2ML IV SOLN
INTRAVENOUS | Status: AC
  Filled 2016-07-09: qty 2

## 2016-07-09 MED ORDER — VITAMIN D (ERGOCALCIFEROL) 1.25 MG (50000 UNIT) PO CAPS: 50000.0000 [IU] | ORAL_CAPSULE | ORAL | Status: DC

## 2016-07-09 MED ORDER — 0.9 % SODIUM CHLORIDE (POUR BTL) OPTIME
TOPICAL | Status: DC | PRN
Start: 2016-07-09 — End: 2016-07-09
  Administered 2016-07-09 (×2): 1000 mL

## 2016-07-09 SURGICAL SUPPLY — 49 items
CANISTER SUCT 3000ML PPV (MISCELLANEOUS) ×3 IMPLANT
CANNULA VESSEL 3MM 2 BLNT TIP (CANNULA) ×6 IMPLANT
CATH ROBINSON RED A/P 18FR (CATHETERS) ×3 IMPLANT
CATH SUCT 10FR WHISTLE TIP (CATHETERS) ×3 IMPLANT
CLIP LIGATING EXTRA MED SLVR (CLIP) ×3 IMPLANT
CLIP LIGATING EXTRA SM BLUE (MISCELLANEOUS) ×3 IMPLANT
CRADLE DONUT ADULT HEAD (MISCELLANEOUS) ×3 IMPLANT
DECANTER SPIKE VIAL GLASS SM (MISCELLANEOUS) IMPLANT
DERMABOND ADVANCED (GAUZE/BANDAGES/DRESSINGS) ×4
DERMABOND ADVANCED .7 DNX12 (GAUZE/BANDAGES/DRESSINGS) ×2 IMPLANT
DRAIN CHANNEL 15F RND FF W/TCR (WOUND CARE) ×3 IMPLANT
DRAIN HEMOVAC 1/8 X 5 (WOUND CARE) IMPLANT
DRSG COVADERM 4X8 (GAUZE/BANDAGES/DRESSINGS) ×3 IMPLANT
ELECT REM PT RETURN 9FT ADLT (ELECTROSURGICAL) ×3
ELECTRODE REM PT RTRN 9FT ADLT (ELECTROSURGICAL) ×1 IMPLANT
EVACUATOR SILICONE 100CC (DRAIN) ×3 IMPLANT
GLOVE BIO SURGEON STRL SZ 6.5 (GLOVE) ×6 IMPLANT
GLOVE BIO SURGEONS STRL SZ 6.5 (GLOVE) ×3
GLOVE BIOGEL PI IND STRL 6.5 (GLOVE) ×1 IMPLANT
GLOVE BIOGEL PI IND STRL 7.5 (GLOVE) ×1 IMPLANT
GLOVE BIOGEL PI INDICATOR 6.5 (GLOVE) ×2
GLOVE BIOGEL PI INDICATOR 7.5 (GLOVE) ×2
GLOVE ECLIPSE 7.0 STRL STRAW (GLOVE) ×3 IMPLANT
GLOVE SS BIOGEL STRL SZ 7.5 (GLOVE) ×1 IMPLANT
GLOVE SUPERSENSE BIOGEL SZ 7.5 (GLOVE) ×2
GLOVE SURG SS PI 7.0 STRL IVOR (GLOVE) ×3 IMPLANT
GOWN STRL REUS W/ TWL LRG LVL3 (GOWN DISPOSABLE) ×4 IMPLANT
GOWN STRL REUS W/ TWL XL LVL3 (GOWN DISPOSABLE) ×1 IMPLANT
GOWN STRL REUS W/TWL LRG LVL3 (GOWN DISPOSABLE) ×8
GOWN STRL REUS W/TWL XL LVL3 (GOWN DISPOSABLE) ×2
KIT BASIN OR (CUSTOM PROCEDURE TRAY) ×3 IMPLANT
KIT ROOM TURNOVER OR (KITS) ×3 IMPLANT
NEEDLE 22X1 1/2 (OR ONLY) (NEEDLE) IMPLANT
NS IRRIG 1000ML POUR BTL (IV SOLUTION) ×6 IMPLANT
PACK CAROTID (CUSTOM PROCEDURE TRAY) ×3 IMPLANT
PAD ARMBOARD 7.5X6 YLW CONV (MISCELLANEOUS) ×6 IMPLANT
PATCH HEMASHIELD 8X150 (Vascular Products) ×3 IMPLANT
SHUNT CAROTID BYPASS 10 (VASCULAR PRODUCTS) IMPLANT
SHUNT CAROTID BYPASS 12FRX15.5 (VASCULAR PRODUCTS) IMPLANT
SPONGE LAP 18X18 X RAY DECT (DISPOSABLE) ×3 IMPLANT
SUT ETHILON 3 0 PS 1 (SUTURE) ×3 IMPLANT
SUT PROLENE 6 0 CC (SUTURE) ×30 IMPLANT
SUT SILK 3 0 (SUTURE)
SUT SILK 3-0 18XBRD TIE 12 (SUTURE) IMPLANT
SUT VIC AB 3-0 SH 27 (SUTURE) ×4
SUT VIC AB 3-0 SH 27X BRD (SUTURE) ×2 IMPLANT
SUT VICRYL 4-0 PS2 18IN ABS (SUTURE) ×3 IMPLANT
SYR CONTROL 10ML LL (SYRINGE) IMPLANT
WATER STERILE IRR 1000ML POUR (IV SOLUTION) ×3 IMPLANT

## 2016-07-09 NOTE — H&P (View-Only) (Signed)
[facility] [place]  Patient name: Zachary [name] MRN: [id number] DOB: 08/22/1921 Sex: male   REFERRING PROVIDER:    Dr. Erlinda Hong   REASON FOR CONSULT:    Left brain stroke Left carotid stenosis  HISTORY OF PRESENT ILLNESS:   Zachary [name] is a 83 y.o. male, who Presented to the emergency department on 06/29/2016 status post an unwitnessed fall with reported slurred speech and right arm weakness.  The patient lives at home with his wife.  They have a caregiver that works with them 6 days a week, however she had not been by for 24 hours because of weather issues.  The patient was not given TPA because of the timing.  Initial head CT was negative.  He did have a MRI which shows multiple small foci of acute ischemic infarcts on the left.  The patient has a history of a right brain stroke and is status post right carotid endarterectomy by Dr. early in 2015.  He does suffer from early dementia which is mild he is a former smoker and he is on a statin for hypercholesterolemia.  He is medically managed for hypertension.  He is currently on dual antiplatelet therapy with aspirin and Plavix  PAST MEDICAL HISTORY    Past Medical History:  Diagnosis Date  . Arthritis   . Carotid artery occlusion   . GERD (gastroesophageal reflux disease)   . Headache    "sometimes monthly" (06/29/2016)  . Heart block   . History of blood transfusion    "while in the service"  . History of stomach ulcers   . Hypertension   . Insomnia   . Pancreatitis   . Prostate cancer ([place])    Archie Endo 09/02/2010  . Prostate enlargement   . PVC's (premature ventricular contractions)   . PVD (peripheral vascular disease) ([place])   . Stroke ([place]) 05/2013; 06/29/2016   Archie Endo 05/24/2013  . Type II diabetes mellitus ([place]) dx'd 2008   Archie Endo 08/19/2010     FAMILY HISTORY   Family History  Problem Relation Age of Onset  . Breast cancer Daughter   . Cancer Daughter 53     died of cancer   . Stroke Father   . Other      hypogonadism  . Heart attack Neg Hx   . Hypertension Neg Hx     SOCIAL HISTORY:   Social History   Social History  . Marital status: Married    Spouse name: N/A  . Number of children: N/A  . Years of education: N/A   Occupational History  . retired     [place] History Main Topics  . Smoking status: Former Smoker    Packs/day: 3.00    Years: 20.00    Quit date: 04/19/1962  . Smokeless tobacco: Never Used  . Alcohol use No  . Drug use: No  . Sexual activity: Not Currently   Other Topics Concern  . Not on file   Social History Narrative  . No narrative on file    ALLERGIES:    Allergies  Allergen Reactions  . Penicillins Rash    Has patient had a PCN reaction causing immediate rash, facial/tongue/throat swelling, SOB or lightheadedness with hypotension: No Has patient had a PCN reaction causing severe rash involving mucus membranes or skin necrosis: No Has patient had a PCN reaction that required hospitalization No Has patient had a PCN reaction occurring within the last 10 years: No If  all of the above answers are "NO", then may proceed with Cephalosporin use.    CURRENT MEDICATIONS:    Current Facility-Administered Medications  Medication Dose Route Frequency Provider Last Rate Last Dose  . 0.9 % NaCl with KCl 20 mEq/ L  infusion   Intravenous Continuous Dron Tanna Furry, MD 100 mL/hr at 07/01/16 0306    . acetaminophen (TYLENOL) tablet 650 mg  650 mg Oral Q4H PRN Samella Parr, NP   650 mg at 06/29/16 2210   Or  . acetaminophen (TYLENOL) solution 650 mg  650 mg Per Tube Q4H PRN Samella Parr, NP       Or  . acetaminophen (TYLENOL) suppository 650 mg  650 mg Rectal Q4H PRN Samella Parr, NP      . aspirin suppository 300 mg  300 mg Rectal Daily Samella Parr, NP       Or  . aspirin tablet 325 mg  325 mg Oral Daily Samella Parr, NP   325 mg at 07/01/16 0855  .  atorvastatin (LIPITOR) tablet 10 mg  10 mg Oral Daily Samella Parr, NP   10 mg at 07/01/16 0856  . clopidogrel (PLAVIX) tablet 75 mg  75 mg Oral Daily Rosalin Hawking, MD   75 mg at 07/01/16 0855  . enoxaparin (LOVENOX) injection 40 mg  40 mg Subcutaneous Q24H Samella Parr, NP   40 mg at 06/29/16 2018  . fluticasone (FLONASE) 50 MCG/ACT nasal spray 1 spray  1 spray Each Nare Daily PRN Samella Parr, NP      . insulin aspart (novoLOG) injection 0-5 Units  0-5 Units Subcutaneous QHS Samella Parr, NP      . insulin aspart (novoLOG) injection 0-9 Units  0-9 Units Subcutaneous TID WC Samella Parr, NP   1 Units at 06/30/16 1736  . memantine (NAMENDA XR) 24 hr capsule 28 mg  28 mg Oral Daily Samella Parr, NP   28 mg at 07/01/16 0853  . pantoprazole (PROTONIX) EC tablet 40 mg  40 mg Oral Daily Samella Parr, NP   40 mg at 07/01/16 0856  . pentoxifylline (TRENTAL) CR tablet 400 mg  400 mg Oral TID WC Samella Parr, NP   400 mg at 07/01/16 0854  . potassium chloride 20 MEQ/15ML (10%) solution 20 mEq  20 mEq Oral Daily Dron Tanna Furry, MD   20 mEq at 07/01/16 0857  . sertraline (ZOLOFT) tablet 50 mg  50 mg Oral Daily Samella Parr, NP   50 mg at 07/01/16 0857  . tamsulosin (FLOMAX) capsule 0.4 mg  0.4 mg Oral Daily Samella Parr, NP   0.4 mg at 07/01/16 7829    REVIEW OF SYSTEMS:   [X]  denotes positive finding, [ ]  denotes negative finding Cardiac  Comments:  Chest pain or chest pressure:    Shortness of breath upon exertion:    Short of breath when lying flat:    Irregular heart rhythm:        Vascular    Pain in calf, thigh, or hip brought on by ambulation:    Pain in feet at night that wakes you up from your sleep:     Blood clot in your veins:    Leg swelling:         Pulmonary    Oxygen at home:    Productive cough:     Wheezing:         Neurologic  Sudden weakness in arms or legs:  x   Sudden numbness in arms or legs:  x   Sudden onset of difficulty speaking  or slurred speech: x   Temporary loss of vision in one eye:     Problems with dizziness:         Gastrointestinal    Blood in stool:      Vomited blood:         Genitourinary    Burning when urinating:     Blood in urine:        Psychiatric    Major depression:         Hematologic    Bleeding problems:    Problems with blood clotting too easily:        Skin    Rashes or ulcers:        Constitutional    Fever or chills:     PHYSICAL EXAM:   Vitals:   06/30/16 2145 06/30/16 2217 07/01/16 0435 07/01/16 0529  BP: (!) 179/61 (!) 143/82  (!) 158/53  Pulse: 72   66  Resp: 18   18  Temp: 97.5 F (36.4 C)   97.9 F (36.6 C)  TempSrc: Oral     SpO2: 95%  96% 95%  Weight:      Height:        GENERAL: The patient is a well-nourished male, in no acute distress. The vital signs are documented above. CARDIAC: There is a regular rate and rhythm.  PULMONARY:  nonlabored respirations ABDOMEN: Soft and non-tender with normal pitched bowel sounds.  MUSCULOSKELETAL: There are no major deformities or cyanosis. NEUROLOGIC:   Good grip strength in the right arm but slightly decreased from the left.  He can raise both feet off the bed.  Smile is symmetric.   SKIN: There are no ulcers or rashes noted. PSYCHIATRIC: The patient has a normal affect.  STUDIES:   I reviewed the following studies: MRI of brain: 1. Multiple small foci of acute ischemia within the left hemisphere, predominantly within the left MCA distribution. There is a single focus in the left occipital lobe, which is in the PCA distribution. The appearance is consistent with an embolic source. Note that there is a fetal origin of the left PCA, so the left ICA could serve as an embolic source for the left MCA and PCA distributions. 2. Severe narrowing of the left internal carotid artery distal petrous and proximal lacerum segments. No emergent large vessel occlusion. 3. Mild stenosis of the right internal carotid  artery petrous segment. 4. Lack of flow related enhancement within the V3 segment of the right vertebral artery, which is likely occluded proximally. Enhancement within the right V4 segment is likely secondary to collateral filling. 5. No hemorrhage or mass effect.  CT angiogram: 1. No emergent intracranial large vessel occlusion. 2. Severe stenosis of the proximal left internal carotid artery, measuring approximately 80% by NASCET criteria. 3. Severe stenosis of the left internal carotid artery distal petrous segment and approximately 50% stenosis of the cavernous segment of the left ICA and distal petrous segment of the right ICA. 4. Chronic occlusion of the right vertebral artery. 5. Bilateral common carotid artery stenosis of approximately 50%. 6. Aortic atherosclerosis and biapical emphysema.  ASSESSMENT and PLAN   Symptomatic left carotid stenosis: I had a lengthy discussion with the patient, his wife and caregiver, and his daughter.  This is obviously a tricky situation given the patient's age and comorbidities in addition to  the symptomatically left carotid stenosis.  We discussed the pros and cons of medical treatment versus surgical intervention.  Currently after discussing this with Dr. early who has operated on him in the past we are favoring inpatient rehabilitation to see how he recovers and if he makes a good recovery planning for left carotid endarterectomy at a later date.  Dr. early is planning on talking with the family tomorrow morning to confirm our plans.   Annamarie Major, MD [facility] [phone] Pager [phone]

## 2016-07-09 NOTE — Anesthesia Preprocedure Evaluation (Addendum)
Anesthesia Evaluation  Patient identified by MRN, date of birth, ID band Patient awake    Reviewed: Allergy & Precautions, NPO status , Patient's Chart, lab work & pertinent test results  Airway Mallampati: II  TM Distance: >3 FB Neck ROM: Full    Dental no notable dental hx. (+) Teeth Intact, Dental Advisory Given   Pulmonary neg pulmonary ROS, former smoker,    Pulmonary exam normal breath sounds clear to auscultation       Cardiovascular hypertension, + Peripheral Vascular Disease  Normal cardiovascular exam Rhythm:Regular Rate:Normal     Neuro/Psych CVA negative psych ROS   GI/Hepatic negative GI ROS, Neg liver ROS,   Endo/Other  negative endocrine ROSdiabetes  Renal/GU negative Renal ROS  negative genitourinary   Musculoskeletal negative musculoskeletal ROS (+)   Abdominal   Peds negative pediatric ROS (+)  Hematology negative hematology ROS (+)   Anesthesia Other Findings   Reproductive/Obstetrics negative OB ROS                            Anesthesia Physical Anesthesia Plan  ASA: III  Anesthesia Plan: General   Post-op Pain Management:    Induction: Intravenous  Airway Management Planned: Oral ETT  Additional Equipment: Arterial line  Intra-op Plan:   Post-operative Plan: Extubation in OR  Informed Consent: I have reviewed the patients History and Physical, chart, labs and discussed the procedure including the risks, benefits and alternatives for the proposed anesthesia with the patient or authorized representative who has indicated his/her understanding and acceptance.   Dental advisory given  Plan Discussed with: CRNA, Surgeon and Anesthesiologist  Anesthesia Plan Comments:        Anesthesia Quick Evaluation

## 2016-07-09 NOTE — Anesthesia Procedure Notes (Addendum)
Procedure Name: Intubation Date/Time: 07/09/2016 7:46 AM Performed by: Julieta Bellini Pre-anesthesia Checklist: Patient identified, Emergency Drugs available, Suction available and Patient being monitored Patient Re-evaluated:Patient Re-evaluated prior to inductionOxygen Delivery Method: Circle system utilized Preoxygenation: Pre-oxygenation with 100% oxygen Intubation Type: IV induction Ventilation: Mask ventilation without difficulty and Oral airway inserted - appropriate to patient size Laryngoscope Size: Mac and 4 Grade View: Grade II Tube type: Oral Tube size: 7.5 mm Number of attempts: 1 Airway Equipment and Method: Stylet Placement Confirmation: ETT inserted through vocal cords under direct vision,  positive ETCO2 and breath sounds checked- equal and bilateral Secured at: 23 cm Tube secured with: Tape Dental Injury: Teeth and Oropharynx as per pre-operative assessment

## 2016-07-09 NOTE — Transfer of Care (Signed)
Immediate Anesthesia Transfer of Care Note  Patient: Zachary [name]  Procedure(s) Performed: Procedure(s): ENDARTERECTOMY CAROTID (Left) PATCH ANGIOPLASTY LEFT CAROTID ARTERY (Left)  Patient Location: PACU  Anesthesia Type:General  Level of Consciousness: awake, alert , oriented and patient cooperative  Airway & Oxygen Therapy: Patient Spontanous Breathing and Patient connected to nasal cannula oxygen  Post-op Assessment: Report given to RN, Post -op Vital signs reviewed and stable and Patient moving all extremities X 4  Post vital signs: Reviewed and stable  Last Vitals:  Vitals:   07/09/16 0558 07/09/16 0559  BP:  (!) 195/90  Pulse:  (!) 35  Resp: 16   Temp: 36.3 C     Last Pain:  Vitals:   07/09/16 0558  TempSrc: Oral         Complications: No apparent anesthesia complications    BP low in PACU, called Dr. Kalman Shan while at beside with RN, orders to start a Neo drip. Have a neo drip hanging at bedside. BP improving.

## 2016-07-09 NOTE — H&P (View-Only) (Signed)
Patient ID: Zachary [name], male   DOB: May 30, 1921, 83 y.o.   MRN: [id number] Comfortable this morning.  No change in neuro status. Long discussion with the patient and his daughter present. He does have some confusion. Would recommend carotid endarterectomy for high-grade symptomatic stenosis. Would favor delaying this for 1-2 weeks to allow him to recover from the acute stroke. Did explain the slight risk of stroke in this interval but feel that this risk is less than proceeding with surgery and general anesthesia in the acute phase of his stroke. Will coordinate this following his discharge to rehabilitation

## 2016-07-09 NOTE — Op Note (Signed)
    OPERATIVE REPORT  DATE OF SURGERY: 07/09/2016  PATIENT: Zachary [name], 83 y.o. male MRN: [id number]  DOB: [birth year]  PRE-OPERATIVE DIAGNOSIS: Symptomatic left internal carotid artery stenosis  POST-OPERATIVE DIAGNOSIS:  Same  PROCEDURE: Left carotid endarterectomy and Dacron patch angioplasty  SURGEON:  Curt Jews, M.D.  PHYSICIAN ASSISTANT: Gerri Lins PA-C  ANESTHESIA:  Gen.   Total I/O In: 2100 [I.V.:1600; IV Piggyback:500] Out: 1250 [Blood:1250]  BLOOD ADMINISTERED: None  DRAINS: Jackson-Pratt  SPECIMEN: None  COUNTS CORRECT:  YES  PLAN OF CARE: PACU  PATIENT DISPOSITION:  PACU - hemodynamically stable  PROCEDURE DETAILS: Patient was taken to the operative placed supine position where the area of the left neck was prepped and draped in sterile fashion. Incision was made anterior to sternocleidomastoid and carried down from below the earlobe to the sternal notch. CT scan showed extensive calcification and plaque in the common carotid artery as well. Critical stenosis at the bifurcation. The common carotid artery was exposed and was encircled with a vessel loop just above the sternal notch and Rummel tourniquet. Dissection which had gone bifurcation. The external carotid was encircled with a vessel loop and the internal carotid was encircled with an umbilical tape and Rummel tourniquet. The hypoglossal and vagus nerves were identified and preserved. The patient was given 8000 units intravenous heparin and after adequate circulation time the internal/external common carotid arteries were occluded. The common carotid artery was opened with 11 blade and sent longitudinally with Potts scissors through the plaque onto the internal carotid above. The arteriotomy was continued down the common carotid through the extensive plaque was seen in the common carotid artery as well. A 10 feeding tube was cut to appropriate length and was used shunt was placed up the internal  carotid to back bleed and down the common carotid were secured with Rummel tourniquet. There was poor backbleeding from the internal carotid artery. The endarterectomy was again on common carotid artery and the plaque was divided proximally with Potts scissors. The endarterectomy was common and extended onto the external carotid with eversion technique and the internal carotid with open fashion. Remaining atheromatous debris was removed from the endarterectomy plane. A long Finesse Hemashield Dacron patch was brought onto the field and was sewn as a patch angioplasty with a running 6-0 Prolene suture. Prior to completion of the closure the shunt was removed and the usual flushing maneuvers were undertaken. Anastomosis completed and was restored first to the internal and excellent carotid artery. There was extensive needle hole bleeding around the periphery of the patch and multiple additional 60 sutures were required for hemostasis. Patient was given 100 mg of protamine to reverse the heparin. The wounds were irrigated with saline. Decision was made to place a drain since there was extensive oozing from the tissues. A fluted drain was brought through separate stab incision just above the clavicle and was secured to the skin with a 3-0 nylon stitch and was placed at the base of the wound. The cervical mastoid was reapproximated with that 3-0 Vicryl suture over the carotid. The platysma was closed with a running 3-0 Vicryl suture and the skin was closed with a 4-0 subcuticular Vicryl stitch. Sterile dressing was applied and the patient was transferred to the recovery room in stable condition. He was awakened in the operating room and was neurologically intact   Rosetta Posner, M.D., [facility] 07/09/2016 2:30 PM

## 2016-07-09 NOTE — Anesthesia Postprocedure Evaluation (Signed)
Anesthesia Post Note  Patient: Zachary [name]  Procedure(s) Performed: Procedure(s) (LRB): ENDARTERECTOMY CAROTID (Left) PATCH ANGIOPLASTY LEFT CAROTID ARTERY (Left)  Patient location during evaluation: PACU Anesthesia Type: General Level of consciousness: awake and alert Pain management: pain level controlled Vital Signs Assessment: post-procedure vital signs reviewed and stable Respiratory status: spontaneous breathing, nonlabored ventilation, respiratory function stable and patient connected to nasal cannula oxygen Cardiovascular status: stable Postop Assessment: no signs of nausea or vomiting Anesthetic complications: no Comments: Mildly hypotensive, will go to stepdown unit overnight       Last Vitals:  Vitals:   07/09/16 1207 07/09/16 1210  BP: (!) 88/46 (!) 89/50  Pulse: 84 89  Resp: (!) 9 (!) 4  Temp:      Last Pain:  Vitals:   07/09/16 1156  TempSrc:   PainSc: Asleep                 Hurley Sobel S

## 2016-07-09 NOTE — Interval H&P Note (Signed)
History and Physical Interval Note:  07/09/2016 7:23 AM  Zachary [name]  has presented today for surgery, with the diagnosis of Left ICA Stenosis I65.22 Left Brain Stroke  The various methods of treatment have been discussed with the patient and family. After consideration of risks, benefits and other options for treatment, the patient has consented to  Procedure(s): ENDARTERECTOMY CAROTID (Left) as a surgical intervention .  The patient's history has been reviewed, patient examined, no change in status, stable for surgery.  I have reviewed the patient's chart and labs.  Questions were answered to the patient's satisfaction.     Curt Jews

## 2016-07-09 NOTE — Interval H&P Note (Signed)
History and Physical Interval Note:  07/09/2016 7:22 AM  Zachary [name]  has presented today for surgery, with the diagnosis of Left ICA Stenosis I65.22 Left Brain Stroke  The various methods of treatment have been discussed with the patient and family. After consideration of risks, benefits and other options for treatment, the patient has consented to  Procedure(s): ENDARTERECTOMY CAROTID (Left) as a surgical intervention .  The patient's history has been reviewed, patient examined, no change in status, stable for surgery.  I have reviewed the patient's chart and labs.  Questions were answered to the patient's satisfaction.     Curt Jews

## 2016-07-09 NOTE — Progress Notes (Signed)
PHARMACY NOTE:  ANTIMICROBIAL RENAL DOSAGE ADJUSTMENT  Current antimicrobial regimen includes a mismatch between antimicrobial dosage and estimated renal function.  As per policy approved by the Pharmacy & Therapeutics and Medical Executive Committees, the antimicrobial dosage will be adjusted accordingly.  Current antimicrobial dosage:  Vancomycin 1000mg  IV q12 x 2 doses  Indication: Post-op prophylaxis  Renal Function:  Estimated Creatinine Clearance: 46.7 mL/min (by C-G formula based on SCr of 1.13 mg/dL). []      On intermittent HD, scheduled: []      On CRRT    Antimicrobial dosage has been changed to:  Vancomycin 1000mg  IV q24 x 1 dose  Additional comments:  Pt is 83yo with CrCl ~89ml/min.  Adjusted dose for renal function and to cover same period of time post-op.   Thank you for allowing pharmacy to be a part of this patient's care.   Gracy Bruins, PharmD Clinical Pharmacist [facility]

## 2016-07-09 NOTE — Progress Notes (Signed)
      S/P left CEA  Grip 5/5, no tongue deviation, and smile is symmetric  Will check H/H tomorrow 1200 cc estimated blood loss  Plan to discharge Sun or Monday pending recovery  Asencion Guisinger MAUREEN PA-C

## 2016-07-10 LAB — BASIC METABOLIC PANEL
Anion gap: 6 (ref 5–15)
BUN: 9 mg/dL (ref 6–20)
CALCIUM: 8.4 mg/dL — AB (ref 8.9–10.3)
CHLORIDE: 105 mmol/L (ref 101–111)
CO2: 29 mmol/L (ref 22–32)
Creatinine, Ser: 0.94 mg/dL (ref 0.61–1.24)
GFR calc Af Amer: >60 mL/min (ref >60)
GFR calc non Af Amer: >60 mL/min (ref >60)
GLUCOSE: 141 mg/dL — AB (ref 65–99)
Potassium: 4 mmol/L (ref 3.5–5.1)
Sodium: 140 mmol/L (ref 135–145)

## 2016-07-10 LAB — CBC
HEMATOCRIT: 29.1 % — AB (ref 39.0–52.0)
HEMOGLOBIN: 9.6 g/dL — AB (ref 13.0–17.0)
MCH: 31.3 pg (ref 26.0–34.0)
MCHC: 33 g/dL (ref 30.0–36.0)
MCV: 94.8 fL (ref 78.0–100.0)
Platelets: 233 10*3/uL (ref 150–400)
RBC: 3.07 MIL/uL — ABNORMAL LOW (ref 4.22–5.81)
RDW: 13.4 % (ref 11.5–15.5)
WBC: 12.7 10*3/uL — ABNORMAL HIGH (ref 4.0–10.5)

## 2016-07-10 LAB — GLUCOSE, CAPILLARY: Glucose-Capillary: 121 mg/dL — ABNORMAL HIGH (ref 65–99)

## 2016-07-10 NOTE — Plan of Care (Signed)
Problem: Education: Goal: Understanding of discharge needs will improve Outcome: Progressing Will need reinforcement.

## 2016-07-10 NOTE — Progress Notes (Addendum)
[facility] [place]  Subjective  - Sitting up in bedside chair.  Sleepy but answers questions and follows commands.  Known dementia.   Objective (!) 145/66 82 98.7 F (37.1 C) (Oral) 14 91%  Intake/Output Summary (Last 24 hours) at 07/10/16 0811 Last data filed at 07/10/16 0618  Gross per 24 hour  Intake          4010.83 ml  Output             3780 ml  Net           230.83 ml   Left carotid without frank hematoma.  JP drain with watery drainage. Neck soft on the left No tongue deviation, smile is symmetric Grip 5/5 equal B, palpable radial pulses Lungs non labored breathing Heart RRR    Assessment/Planning: POD # left CEA  Cr 0.94, good UO HGB 9.6 stable Drain 55 cc total will D/C drain Stable for transfer to 2W   Laurence Slate [facility] 07/10/2016 8:11 AM --  Laboratory Lab Results:  Recent Labs  07/09/16 1201 07/10/16 0306  WBC 17.7* 12.7*  HGB 9.7* 9.6*  HCT 28.8* 29.1*  PLT 226 233   BMET  Recent Labs  07/09/16 0632  07/09/16 1017 07/10/16 0306  NA 140  < > 142 140  K 3.9  < > 3.6 4.0  CL 103  --   --  105  CO2 27  --   --  29  GLUCOSE 101*  --   --  141*  BUN 13  --   --  9  CREATININE 1.13  --   --  0.94  CALCIUM 8.9  --   --  8.4*  < > = values in this interval not displayed.  COAG Lab Results  Component Value Date   INR 1.10 06/29/2016   INR 0.94 05/20/2013   INR 1.0 12/16/2008   No results found for: PTT   I have independently interviewed patient and agree with PA assessment and plan above. Now on 2W. ASA and plavix. Drain to d/c. Keep another day.   Brandon C. Donzetta Matters, MD [facility] [place] Office: [phone] Pager: [phone]

## 2016-07-11 ENCOUNTER — Encounter (HOSPITAL_COMMUNITY): Payer: Self-pay | Admitting: Vascular Surgery

## 2016-07-11 NOTE — Progress Notes (Addendum)
[facility] [place] well, daughter in room.  Alert and pleasant this am.   Objective 111/85 93 99.5 F (37.5 C) (Oral) 18 98%  Intake/Output Summary (Last 24 hours) at 07/11/16 0802 Last data filed at 07/11/16 1610  Gross per 24 hour  Intake              360 ml  Output             1935 ml  Net            -1575 ml    Left neck incision healing well without frank hematoma JP SS watery drainage Total 60 cc last 24 hours No tongue deviation and smile is symmetric  Assessment/Planning: POD # 2 L cea  Becoming more alert known dementia Drain out put 60 cc over last 24 hour will D/C today Plan D/C back to SNF tomorrow as long as wound is without hematoma Continue Aspirin 325 mg and Plavix daily  Laurence Slate [facility] 07/11/2016 8:03 AM --  Laboratory Lab Results:  Recent Labs  07/09/16 1201 07/10/16 0306  WBC 17.7* 12.7*  HGB 9.7* 9.6*  HCT 28.8* 29.1*  PLT 226 233   BMET  Recent Labs  07/09/16 0632  07/09/16 1017 07/10/16 0306  NA 140  < > 142 140  K 3.9  < > 3.6 4.0  CL 103  --   --  105  CO2 27  --   --  29  GLUCOSE 101*  --   --  141*  BUN 13  --   --  9  CREATININE 1.13  --   --  0.94  CALCIUM 8.9  --   --  8.4*  < > = values in this interval not displayed.  COAG Lab Results  Component Value Date   INR 1.10 06/29/2016   INR 0.94 05/20/2013   INR 1.0 12/16/2008   No results found for: PTT  I have independently interviewed patient and agree with PA assessment and plan above. Drain out today, to [employer] C. Donzetta Matters, MD [facility] [place] Office: [phone] Pager: [phone]

## 2016-07-12 ENCOUNTER — Encounter: Payer: Self-pay | Admitting: Licensed Clinical Social Worker

## 2016-07-12 DIAGNOSIS — E1151 Type 2 diabetes mellitus with diabetic peripheral angiopathy without gangrene: Secondary | ICD-10-CM | POA: Diagnosis not present

## 2016-07-12 DIAGNOSIS — R296 Repeated falls: Secondary | ICD-10-CM | POA: Diagnosis not present

## 2016-07-12 DIAGNOSIS — E785 Hyperlipidemia, unspecified: Secondary | ICD-10-CM | POA: Diagnosis not present

## 2016-07-12 DIAGNOSIS — M6281 Muscle weakness (generalized): Secondary | ICD-10-CM | POA: Diagnosis not present

## 2016-07-12 DIAGNOSIS — R488 Other symbolic dysfunctions: Secondary | ICD-10-CM | POA: Diagnosis not present

## 2016-07-12 DIAGNOSIS — M79604 Pain in right leg: Secondary | ICD-10-CM | POA: Diagnosis not present

## 2016-07-12 DIAGNOSIS — I63232 Cerebral infarction due to unspecified occlusion or stenosis of left carotid arteries: Secondary | ICD-10-CM | POA: Diagnosis not present

## 2016-07-12 DIAGNOSIS — D72829 Elevated white blood cell count, unspecified: Secondary | ICD-10-CM | POA: Diagnosis not present

## 2016-07-12 DIAGNOSIS — I69398 Other sequelae of cerebral infarction: Secondary | ICD-10-CM | POA: Diagnosis not present

## 2016-07-12 DIAGNOSIS — G464 Cerebellar stroke syndrome: Secondary | ICD-10-CM | POA: Diagnosis not present

## 2016-07-12 DIAGNOSIS — I6522 Occlusion and stenosis of left carotid artery: Secondary | ICD-10-CM | POA: Diagnosis not present

## 2016-07-12 DIAGNOSIS — B351 Tinea unguium: Secondary | ICD-10-CM | POA: Diagnosis not present

## 2016-07-12 DIAGNOSIS — F339 Major depressive disorder, recurrent, unspecified: Secondary | ICD-10-CM | POA: Diagnosis not present

## 2016-07-12 DIAGNOSIS — I69328 Other speech and language deficits following cerebral infarction: Secondary | ICD-10-CM | POA: Diagnosis not present

## 2016-07-12 DIAGNOSIS — I639 Cerebral infarction, unspecified: Secondary | ICD-10-CM | POA: Diagnosis not present

## 2016-07-12 DIAGNOSIS — I2510 Atherosclerotic heart disease of native coronary artery without angina pectoris: Secondary | ICD-10-CM | POA: Diagnosis not present

## 2016-07-12 DIAGNOSIS — E118 Type 2 diabetes mellitus with unspecified complications: Secondary | ICD-10-CM | POA: Diagnosis not present

## 2016-07-12 DIAGNOSIS — N400 Enlarged prostate without lower urinary tract symptoms: Secondary | ICD-10-CM | POA: Diagnosis not present

## 2016-07-12 DIAGNOSIS — I63132 Cerebral infarction due to embolism of left carotid artery: Secondary | ICD-10-CM | POA: Diagnosis not present

## 2016-07-12 DIAGNOSIS — E1165 Type 2 diabetes mellitus with hyperglycemia: Secondary | ICD-10-CM | POA: Diagnosis not present

## 2016-07-12 DIAGNOSIS — I69391 Dysphagia following cerebral infarction: Secondary | ICD-10-CM | POA: Diagnosis not present

## 2016-07-12 DIAGNOSIS — I69232 Monoplegia of upper limb following other nontraumatic intracranial hemorrhage affecting left dominant side: Secondary | ICD-10-CM | POA: Diagnosis not present

## 2016-07-12 DIAGNOSIS — Z48812 Encounter for surgical aftercare following surgery on the circulatory system: Secondary | ICD-10-CM | POA: Diagnosis not present

## 2016-07-12 DIAGNOSIS — I491 Atrial premature depolarization: Secondary | ICD-10-CM | POA: Diagnosis not present

## 2016-07-12 DIAGNOSIS — M79605 Pain in left leg: Secondary | ICD-10-CM | POA: Diagnosis not present

## 2016-07-12 DIAGNOSIS — R531 Weakness: Secondary | ICD-10-CM | POA: Diagnosis not present

## 2016-07-12 DIAGNOSIS — I10 Essential (primary) hypertension: Secondary | ICD-10-CM | POA: Diagnosis not present

## 2016-07-12 DIAGNOSIS — R8290 Unspecified abnormal findings in urine: Secondary | ICD-10-CM | POA: Diagnosis not present

## 2016-07-12 DIAGNOSIS — F0150 Vascular dementia without behavioral disturbance: Secondary | ICD-10-CM | POA: Diagnosis not present

## 2016-07-12 DIAGNOSIS — F39 Unspecified mood [affective] disorder: Secondary | ICD-10-CM | POA: Diagnosis not present

## 2016-07-12 DIAGNOSIS — E1140 Type 2 diabetes mellitus with diabetic neuropathy, unspecified: Secondary | ICD-10-CM | POA: Diagnosis not present

## 2016-07-12 DIAGNOSIS — Z7189 Other specified counseling: Secondary | ICD-10-CM | POA: Diagnosis not present

## 2016-07-12 DIAGNOSIS — S43101D Unspecified dislocation of right acromioclavicular joint, subsequent encounter: Secondary | ICD-10-CM | POA: Diagnosis not present

## 2016-07-12 DIAGNOSIS — R1312 Dysphagia, oropharyngeal phase: Secondary | ICD-10-CM | POA: Diagnosis not present

## 2016-07-12 DIAGNOSIS — Z7902 Long term (current) use of antithrombotics/antiplatelets: Secondary | ICD-10-CM | POA: Diagnosis not present

## 2016-07-12 DIAGNOSIS — Z9181 History of falling: Secondary | ICD-10-CM | POA: Diagnosis not present

## 2016-07-12 DIAGNOSIS — G214 Vascular parkinsonism: Secondary | ICD-10-CM | POA: Diagnosis not present

## 2016-07-12 LAB — CBC AND DIFFERENTIAL
HEMATOCRIT: 30 % — AB (ref 41–53)
HEMOGLOBIN: 10.2 g/dL — AB (ref 13.5–17.5)
PLATELETS: 267 10*3/uL (ref 150–399)
WBC: 16.6 10^3/mL

## 2016-07-12 LAB — BASIC METABOLIC PANEL
BUN: 14 mg/dL (ref 4–21)
CREATININE: 0.9 mg/dL (ref 0.6–1.3)
Glucose: 111 mg/dL
Potassium: 4.4 mmol/L (ref 3.4–5.3)
Sodium: 142 mmol/L (ref 137–147)

## 2016-07-12 MED ORDER — TRAMADOL HCL 50 MG PO TABS: 50.0000 mg | ORAL_TABLET | Freq: Two times a day (BID) | ORAL | 0 refills | Status: DC | PRN

## 2016-07-12 MED ORDER — ATORVASTATIN CALCIUM 10 MG PO TABS: 10.0000 mg | ORAL_TABLET | Freq: Every day | ORAL | Status: DC

## 2016-07-12 NOTE — Progress Notes (Addendum)
  Progress Note    07/12/2016 7:36 AM 3 Days Post-Op  Subjective:  No complaints  Tm 99.2 now afebrile 140's-160's HR 70's NSR 95% RA  Vitals:   07/11/16 2014 07/12/16 0444  BP: (!) 149/56 (!) 165/73  Pulse: 87 78  Resp: 18 18  Temp: 99.2 F (37.3 C) 98.1 F (36.7 C)     Physical Exam: Neuro:  In tact; tongue is midline Lungs:  Non labored Incision:  Clean and dry without hematoma  CBC    Component Value Date/Time   WBC 12.7 (H) 07/10/2016 0306   RBC 3.07 (L) 07/10/2016 0306   HGB 9.6 (L) 07/10/2016 0306   HGB 14.1 01/02/2015 1447   HCT 29.1 (L) 07/10/2016 0306   HCT 41.5 01/02/2015 1447   PLT 233 07/10/2016 0306   PLT 253 01/02/2015 1447   MCV 94.8 07/10/2016 0306   MCV 93.7 01/02/2015 1447   MCH 31.3 07/10/2016 0306   MCHC 33.0 07/10/2016 0306   RDW 13.4 07/10/2016 0306   RDW 13.2 01/02/2015 1447   LYMPHSABS 3.7 06/30/2016 0608   LYMPHSABS 3.5 (H) 01/02/2015 1447   MONOABS 1.5 (H) 06/30/2016 0608   MONOABS 1.3 (H) 01/02/2015 1447   EOSABS 0.2 06/30/2016 0608   EOSABS 0.3 01/02/2015 1447   BASOSABS 0.0 06/30/2016 0608   BASOSABS 0.0 01/02/2015 1447    BMET    Component Value Date/Time   NA 140 07/10/2016 0306   NA 143 01/02/2015 1447   K 4.0 07/10/2016 0306   K 4.0 01/02/2015 1447   CL 105 07/10/2016 0306   CO2 29 07/10/2016 0306   CO2 31 (H) 01/02/2015 1447   GLUCOSE 141 (H) 07/10/2016 0306   GLUCOSE 125 01/02/2015 1447   BUN 9 07/10/2016 0306   BUN 15.6 01/02/2015 1447   CREATININE 0.94 07/10/2016 0306   CREATININE 1.2 01/02/2015 1447   CALCIUM 8.4 (L) 07/10/2016 0306   CALCIUM 9.0 01/02/2015 1447   GFRNONAA >60 07/10/2016 0306   GFRAA >60 07/10/2016 0306     Intake/Output Summary (Last 24 hours) at 07/12/16 0736 Last data filed at 07/12/16 0445  Gross per 24 hour  Intake              760 ml  Output             2187 ml  Net            -1427 ml     Assessment/Plan:  This is a 83 y.o. male who is s/p Left carotid endarterectomy  and Dacron patch angioplasty CEA 3 Days Post-Op  -drain is out and there is no swelling in the neck.  Pt is swallowing without difficulty. -most likely will discharge back to [facility] today. -neuro in tact -f/u with Dr. [name] in 2 weeks.   Leontine Locket, PA-C [facility] [phone]  I have examined the patient, reviewed and agree with above. Good today. Discussed with the daughter in the room. Will discharge back to [facility]  Curt Jews, MD 07/12/2016 9:20 AM

## 2016-07-12 NOTE — Care Management Note (Signed)
Case Management Note Marvetta Gibbons RN, BSN Unit 2W-Case Manager [phone]  Patient Details  Name: Zachary [name] MRN: [id number] Date of Birth: 1922/01/19  Subjective/Objective:  Pt admitted s/p CEA                  Action/Plan: PT to d/c to [facility] following   Expected Discharge Date:  07/12/16               Expected Discharge Plan:  [place]  In-House Referral:  Clinical Social Work  Discharge planning Services  CM Consult  Post Acute Care Choice:  NA Choice offered to:  NA  DME Arranged:    DME Agency:     HH Arranged:    [place] Agency:     Status of Service:  Completed, signed off  If discussed at [employer] of Stay Meetings, dates discussed:    Discharge Disposition: skilled facility   Additional Comments:  Dawayne Patricia, RN 07/12/2016, 2:35 PM

## 2016-07-12 NOTE — Progress Notes (Signed)
Clinical Social Worker facilitated patient discharge including contacting patient family and facility to confirm patient discharge plans.  Clinical information faxed to facility and family agreeable with plan.  CSW arranged ambulance transport via [facility] to [place] .  RN Katrina to call [phone] (rm#[id number]) report prior to discharge.  Clinical Social Worker will sign off for now as social work intervention is no longer needed. Please consult [place] again if new need arises.  Rhea Pink, MSW, [place]

## 2016-07-12 NOTE — Progress Notes (Signed)
Zachary [name] was seen today in the movement disorders clinic for neurologic consultation at the request of [name], MD.    The patient is seen today in the movement disorder clinic for consultation regarding possible Parkinson's disease.  The patient is accompanied by his wife who supplements the history.  The patient was apparently seen several years ago by Dr. [name] for the same.  I do not have those records.  He apparently just a one-time visit and was to get an MRI and returned for followup and discussion about medications.  He did not return for followup.  Studies reviewed and available: 05/22/2013 MRI of the brain: Acute posterior right frontal infarction, moderate small vessel disease, and moderate atrophy 05/21/2013 echocardiogram: Left ventricular ejection fraction 55-60% 05/21/2013 carotid ultrasound: 80-99% stenosis of the right internal carotid artery, 40-59% stenosis of the left internal carotid artery. 05/28/2013: Patient underwent right carotid artery endarterectomy 12/25/2013 carotid ultrasound: Widely patent right internal carotid artery, less than 40% stenosis in the left internal carotid artery; with occluded right vertebral proximally with reconstitution distally; stenotic left vertebral in the mid cervical region; significant stenosis of the right subclavian artery.  Pt reports his biggest problem is insomnia and daytime fatigue.  Pt reports he never had a sleep study.  No snoring.  Gets into the bed at 10 pm and watches the news.  If he takes a "sleeping aid" (OTC, as he states that PCP wouldn't renew his sleep med), he will get to sleep by 2:30 am.    Multiple awakenings to use the bathroom and has some new incontinence.  Out of bed at 8:30am.  Never feels refreshed.  Never dozes.  "I sometimes take a nap but I never go to sleep."  Specific Symptoms:  Tremor: No. Voice: no changes Sleep:   Vivid Dreams:  Yes.    Acting out dreams:  No. Wet Pillows:  No. Postural symptoms:  No.  Falls?  No. Bradykinesia symptoms: slow movements Loss of smell:  No. Loss of taste:  No. Urinary Incontinence:  Yes.   Difficulty Swallowing:  Yes.   (liquids some or some trouble with large pills) Handwriting, micrographia: Yes.    Trouble with ADL's:  No.  Trouble buttoning clothing: No. (slower than used to be) Depression:  No. Memory changes:  No. per pt and wife (wife takes care of finances and always has; pt drives without trouble but does very little) Hallucinations:  No.  visual distortions: No. N/V:  No. Lightheaded:  Yes.    Syncope: No. Diplopia:  No. Dyskinesia:  No.  Neuroimaging has previously been performed.  It is available for my review today.  08/07/15 update:  The patient follows up today.  I have not seen him since September, 2015 and that is the only time that I saw him.  At that time, he was complaining about gait instability, which I felt was likely due to peripheral neuropathy as well as vertebral artery stenosis.  He was also complaining about excessive daytime hypersomnolence and I recommended a nocturnal polysomnogram.  He is going to have this done to his primary care provider at [facility].  I called [facility] and pt cancelled this.   I have reviewed records since last visit.  In April, 2016 the patient saw Dr. [name] who diagnosed the patient with hypogonadism.  The patient was started on testosterone, which was ultimately stopped as the patient did not think that it helped.  In September, 2016 the patient had  a fall (versus a syncopal episode) and sustained a hematoma to the forehead.  He was sent to the emergency room and had a CT of the brain.  This was negative.  In that same month, he saw oncology regarding fatigue and leukocytosis.  He had had that leukocytosis since 2010.  It was not felt that this was particularly significant.  On 08/04/2015 he presented to the hospital with another possible syncopal episode.  They are not sure if he  actually had a syncopal episode.  Apparently, he was sitting in his chair and became poorly responsive.  EMS was contacted and his blood sugar was 234.  His CT of the brain that was negative for acute changes.  I reviewed this.  There was very significant atrophy.  There is no changes when compared to September, 2016 examination.  The CNA states that they were told his BP dropped.  His caregiver (CNA)  today states that he is not here because of this but rather because they thought that he had shuffling and changes c/w PD.  He apparently had this appt before the hospitalization.  CNA states that PT has been coming to the house for a few months and noted shuffling and the PT told him to come back to doctor.  Uses cane at home.  Hasn't had a fall for last few months.  No tremors.    07/14/16 update:  Patient follows up today.  I have not seen him in a year.  No one accompanies him and states that he doesn't know why he is here.   Daughter comes in after visit was over and I did talk with her in the hall.  Extensive number of records reviewed.  The patient presented to the emergency room on 06/29/2016 with a several-day history of right-sided weakness.  MRI of the brain on 06/29/2016 demonstrated several DWI-positive lesions in the left MCA distribution.  CTA of the neck demonstrated 80% stenosis of the left MCA.  Patient underwent left carotid endarterectomy on 07/09/2016 by Dr. [name].  Last A1C quite elevated compared to prior.  Was 6.6 and now 9.5.  Meds added by SNF for this.  Pt states goal to go home but daughter states that he will stay at SNF (perhaps not one currently in but cannot go home).  Wife with memory issues as well.   ALLERGIES:   Allergies  Allergen Reactions  . Penicillins Rash    Has patient had a PCN reaction causing immediate rash, facial/tongue/throat swelling, SOB or lightheadedness with hypotension: No Has patient had a PCN reaction causing severe rash involving mucus membranes or  skin necrosis: No Has patient had a PCN reaction that required hospitalization No Has patient had a PCN reaction occurring within the last 10 years: No If all of the above answers are "NO", then may proceed with Cephalosporin use.    CURRENT MEDICATIONS:  Outpatient Encounter Prescriptions as of 07/14/2016  Medication Sig  . aspirin 325 MG tablet Take 1 tablet (325 mg total) by mouth daily.  Marland Kitchen atorvastatin (LIPITOR) 10 MG tablet Take 1 tablet (10 mg total) by mouth daily at 6 PM.  . bisacodyl (DULCOLAX) 10 MG suppository Place 10 mg rectally daily as needed for moderate constipation (for constipation not relieved by milk of magnesium).  Marland Kitchen clopidogrel (PLAVIX) 75 MG tablet Take 1 tablet (75 mg total) by mouth daily.  . ergocalciferol (VITAMIN D2) 50000 UNITS capsule Take 50,000 Units by mouth every Friday.   . fluticasone (FLONASE)  50 MCG/ACT nasal spray Place 1 spray into both nostrils daily as needed for allergies or rhinitis.  Marland Kitchen glimepiride (AMARYL) 2 MG tablet Take 4 mg by mouth daily with breakfast.   . magnesium hydroxide (MILK OF MAGNESIA) 400 MG/5ML suspension Take 30 mLs by mouth daily as needed (for constipation).  . Melatonin 3 MG TABS Take by mouth.  . Memantine HCl ER (NAMENDA XR) 28 MG CP24 Take 28 mg by mouth daily.   . metFORMIN (GLUCOPHAGE) 500 MG tablet Take 500 mg by mouth 2 (two) times daily.   . Multiple Vitamin (MULTIVITAMIN WITH MINERALS) TABS Take 1 tablet by mouth daily.  Marland Kitchen omeprazole (PRILOSEC) 20 MG capsule Take 20 mg by mouth daily at 6 (six) AM.   . pentoxifylline (TRENTAL) 400 MG CR tablet   . potassium chloride 20 MEQ/15ML (10%) SOLN Take 15 mLs (20 mEq total) by mouth daily.  . sertraline (ZOLOFT) 50 MG tablet Take 50 mg by mouth daily.  . Tamsulosin HCl (FLOMAX) 0.4 MG CAPS Take 0.4 mg by mouth daily at 6 PM.   . traMADol (ULTRAM) 50 MG tablet Take 1 tablet (50 mg total) by mouth every 12 (twelve) hours as needed.  . zolpidem (AMBIEN) 5 MG tablet   .  [DISCONTINUED] glimepiride (AMARYL) 4 MG tablet Take 4 mg by mouth daily with breakfast.  . [DISCONTINUED] pentoxifylline (TRENTAL) 400 MG CR tablet Take 400 mg by mouth 3 (three) times daily with meals.  . [DISCONTINUED] Sodium Phosphates (RA SALINE ENEMA RE) Place 1 Bottle rectally daily as needed (for constipation not relieved by MOM or biscodyl suppository).  . [DISCONTINUED] zolpidem (AMBIEN) 5 MG tablet Take 5 mg by mouth at bedtime as needed for sleep.   No facility-administered encounter medications on file as of 07/14/2016.     PAST MEDICAL HISTORY:   Past Medical History:  Diagnosis Date  . Arthritis   . Carotid artery occlusion   . GERD (gastroesophageal reflux disease)   . Headache    "sometimes monthly" (06/29/2016)  . Heart block   . History of blood transfusion    "while in the service"  . History of stomach ulcers   . Hypertension   . Insomnia   . Pancreatitis   . Prostate cancer ([place])    Archie Endo 09/02/2010  . Prostate enlargement   . PVC's (premature ventricular contractions)   . PVD (peripheral vascular disease) ([place])   . Rhabdomyolysis 06/2016  . Stroke ([place]) 05/2013; 06/29/2016   Archie Endo 05/24/2013  . Type II diabetes mellitus ([place]) dx'd 2008   Archie Endo 08/19/2010    PAST SURGICAL HISTORY:   Past Surgical History:  Procedure Laterality Date  . CATARACT EXTRACTION W/ INTRAOCULAR LENS  IMPLANT, BILATERAL Bilateral   . CHOLECYSTECTOMY  07/18/2011   Procedure: LAPAROSCOPIC CHOLECYSTECTOMY;  Surgeon: Gwenyth Ober, MD;  Location: [facility];  Service: General;  Laterality: N/A;  . ENDARTERECTOMY Right 05/28/2013   Procedure: ENDARTERECTOMY CAROTID;  Surgeon: Rosetta Posner, MD;  Location: [facility];  Service: Vascular;  Laterality: Right;  . ENDARTERECTOMY Left 07/09/2016   Procedure: ENDARTERECTOMY CAROTID;  Surgeon: Rosetta Posner, MD;  Location: [facility];  Service: Vascular;  Laterality: Left;  . ESOPHAGOGASTRODUODENOSCOPY (EGD) WITH ESOPHAGEAL DILATION  2004; 12/2009   Archie Endo 01/08/2010   . FLEXIBLE SIGMOIDOSCOPY N/A 09/23/2014   Procedure: FLEXIBLE SIGMOIDOSCOPY;  Surgeon: Garlan Fair, MD;  Location: [facility];  Service: Endoscopy;  Laterality: N/A;  . INSERTION PROSTATE RADIATION SEED  03/1999   Archie Endo 09/02/2010  .  PATCH ANGIOPLASTY Left 07/09/2016   Procedure: PATCH ANGIOPLASTY LEFT CAROTID ARTERY;  Surgeon: Rosetta Posner, MD;  Location: [facility];  Service: Vascular;  Laterality: Left;  . PROSTATE BIOPSY  2000   Archie Endo 09/02/2010  . TONSILLECTOMY      SOCIAL HISTORY:   Social History   Social History  . Marital status: Married    Spouse name: N/A  . Number of children: N/A  . Years of education: N/A   Occupational History  . retired     [place] History Main Topics  . Smoking status: Former Smoker    Packs/day: 3.00    Years: 20.00    Quit date: 04/19/1962  . Smokeless tobacco: Never Used  . Alcohol use No  . Drug use: No  . Sexual activity: Not Currently   Other Topics Concern  . Not on file   Social History Narrative  . No narrative on file    FAMILY HISTORY:   Family Status  Relation Status  . Daughter Deceased at age 44   breast cancer  . Father Deceased   stroke  . Mother Deceased   unknown cause  . Sister Alive   healthy  . Son Alive   healthy  .    Marland Kitchen Neg Hx     ROS:  A complete 10 system review of systems was obtained and was unremarkable apart from what is mentioned above.  PHYSICAL EXAMINATION:    VITALS:   Vitals:   07/14/16 0926  BP: 134/72  Pulse: 88  SpO2: 93%    GEN:  The patient appears stated age and is in NAD. HEENT:  Normocephalic, atraumatic.  The mucous membranes are moist. The superficial temporal arteries are without ropiness or tenderness. CV:  RRR Lungs:  CTAB Neck/HEME:  There are no carotid bruits bilaterally.  Neurological examination:  Orientation: does not know examiner.  Does not know month/year Montreal Cognitive Assessment  01/07/2014  Visuospatial/ Executive (0/5) 3    Naming (0/3) 2  Attention: Read list of digits (0/2) 2  Attention: Read list of letters (0/1) 1  Attention: Serial 7 subtraction starting at 100 (0/3) 2  Language: Repeat phrase (0/2) 2  Language : Fluency (0/1) 0  Abstraction (0/2) 2  Delayed Recall (0/5) 0  Orientation (0/6) 5  Total 19  Adjusted Score (based on education) 19    Cranial nerves: There is good facial symmetry.  The right pupil is surgical and not reactive.  The left pupil is round and nonreactive.  Funduscopic exam reveals a clear disc margin on the right.  No red reflex is even identified on the left and cannot view fundus on the left.  Extraocular muscles are intact. The visual fields are full to confrontational testing. The speech is fluent and clear. Soft palate rises symmetrically and there is no tongue deviation. Hearing is intact to conversational tone. Sensation: Sensation is intact to light and pinprick throughout (facial, trunk, extremities).  Does not report decreased pinprick in a stocking distribution. Does not report asymmetry.  Vibration is markedly decreased distally. There is no extinction with double simultaneous stimulation. There is no sensory dermatomal level identified. Motor: Strength is 5/5 in the bilateral upper and lower extremities with the exception of finger abductors and strength there is 4/5 bilaterally.   Shoulder shrug is equal and symmetric.  There is no pronator drift.  Movement examination: Tone: There is normal tone today Abnormal movements: None Coordination:  There is no  significant decremation with RAM's Gait and Station: The patient requires mild help out of the wheelchair.  He is given a walker.  He is short stepped.   Labs:  Lab Results  Component Value Date   HGBA1C 9.5 (H) 06/30/2016   Lab Results  Component Value Date   CHOL 147 06/30/2016   HDL 25 (L) 06/30/2016   LDLCALC 99 06/30/2016   TRIG 113 06/30/2016   CHOLHDL 5.9 06/30/2016     ASSESSMENT/PLAN:  1.   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  -Patient will be on dual aspirin/Plavix therapy for 3 months (until mid June, 2018)  -Talked about stroke risk factors which include  HTN, hyperlipidemia, previous hx of stroke, age, DM.  Talked about those risk factors which are modifiable.  -Talked about importance of blood pressure control with a goal <130/80 mm Hg.   -Talked about importance of lipid control and proper diet.  Lipids should be managed intensively, with a goal LDL < 70 mg/dL.  Current LDL is 99. On lipitor 10mg   -I counseled the patient on measures to reduce stroke risk, including the importance of medication compliance, risk factor control, exercise, healthy diet, and avoidance of smoking.   2.  Gait instability.  -multifactorial due to Peripheral neuropathy.  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  I told the patient that I do not see evidence of idiopathic parkinsons disease but I do think that it is possible that the patient has vascular parkinsonism.  This is evidenced by the fact that the patient looks pretty good clinically when sitting down but looks more parkinsonian when ambulating, with short shuffling steps.  I talked to the patient about the difference between idiopathic Parkinson's disease and vascular parkinsonism.  I told the patient that carbidopa/levodopa only works about 30% of the time in patients with vascular parkinsonism, and in those patients that it does work, it usually only works for a few years.  Opted against trying it given memory issues.   3.  Follow-up as needed.  Much greater than 50% of this visit was spent in counseling and coordinating care.  Total face to face time:  25 min

## 2016-07-12 NOTE — Discharge Summary (Signed)
Discharge Summary     Zachary [name] October 03, 1921 83 y.o. male  [id number]  Admission Date: 07/09/2016  Discharge Date: 07/12/16  Physician: Rosetta Posner, MD  Admission Diagnosis: Left ICA Stenosis I65.22 Left Brain Stroke   HPI:   This is a 83 y.o. male who Presented to the emergency department on 06/29/2016 status post an unwitnessed fall with reported slurred speech and right arm weakness.  The patient lives at home with his wife.  They have a caregiver that works with them 6 days a week, however she had not been by for 24 hours because of weather issues.  The patient was not given TPA because of the timing.  Initial head CT was negative.  He did have a MRI which shows multiple small foci of acute ischemic infarcts on the left.  The patient has a history of a right brain stroke and is status post right carotid endarterectomy by Dr. Natacha Jepsen in 2015.  He does suffer from [place] dementia which is mild he is a former smoker and he is on a statin for hypercholesterolemia.  He is medically managed for hypertension.  He is currently on dual antiplatelet therapy with aspirin and Plavix  Hospital Course:  The patient was admitted to the hospital and taken to the operating room on 07/09/2016 and underwent left carotid endarterectomy.  The pt tolerated the procedure well and was transported to the PACU in good condition.   By POD 1, the pt neuro status he was doing well and neuro in tact.  His drain had 55cc total and was left in an additional day.  On POD 2, the pt was doing well.  THe drain was removed.  He will continue his plavix/aspirin.   By POD 3, the pt is doing well.  There is no hematoma around incision.  He is swallowing without difficulty.   He will be discharged back to SNF.  The remainder of the hospital course consisted of increasing mobilization and increasing intake of solids without difficulty.    Recent Labs  07/09/16 1017 07/10/16 0306  NA 142 140  K 3.6 4.0  CL   --  105  CO2  --  29  GLUCOSE  --  141*  BUN  --  9  CALCIUM  --  8.4*    Recent Labs  07/09/16 1201 07/10/16 0306  WBC 17.7* 12.7*  HGB 9.7* 9.6*  HCT 28.8* 29.1*  PLT 226 233   No results for input(s): INR in the last 72 hours.   Discharge Instructions    CAROTID Sugery: Call MD for difficulty swallowing or speaking; weakness in arms or legs that is a new symtom; severe headache.  If you have increased swelling in the neck and/or  are having difficulty breathing, CALL 911    Complete by:  As directed    Call MD for:  redness, tenderness, or signs of infection (pain, swelling, bleeding, redness, odor or green/yellow discharge around incision site)    Complete by:  As directed    Call MD for:  severe or increased pain, loss or decreased feeling  in affected limb(s)    Complete by:  As directed    Call MD for:  temperature >100.5    Complete by:  As directed    Discharge wound care:    Complete by:  As directed    Shower daily with soap and water starting 07/12/16   Lifting restrictions    Complete by:  As directed  No lifting for 2 weeks   Resume previous diet    Complete by:  As directed       Discharge Diagnosis:  Left ICA Stenosis I65.22 Left Brain Stroke  Secondary Diagnosis: Patient Active Problem List   Diagnosis Date Noted  . Carotid stenosis 07/09/2016  . Goals of care, counseling/discussion   . Advance care planning   . Palliative care by specialist   . Stenosis of left carotid artery   . Rhabdomyolysis 06/29/2016  . Stroke-like symptoms 06/29/2016  . Acromioclavicular [facility]) joint injury, right, (chronic separation) 06/29/2016  . BPH (benign prostatic hyperplasia) 06/29/2016  . HLD (hyperlipidemia) 06/29/2016  . PAC (premature atrial contraction) 06/29/2016  . Cerebral embolism with cerebral infarction 06/29/2016  . Diabetes mellitus with complication ([place])   . Aortic sclerosis 09/18/2015  . Fatigue 09/18/2015  . Pre-syncope 08/05/2015  .  Leukocytosis 08/04/2015  . Syncope 08/04/2015  . Hypogonadism male 08/28/2014  . Dementia without behavioral disturbance 07/04/2013  . Stroke ([place]) 06/04/2013  . Urinary retention 05/29/2013  . Carotid artery disease ([place]) 05/28/2013  . History of right-sided carotid endarterectomy 2015 05/28/2013  . Pulsus bigeminis 05/20/2013  . Acute ischemic stroke ([place]) 05/20/2013  . Hypertension 07/15/2011  . PVD (peripheral vascular disease) ([place])    Past Medical History:  Diagnosis Date  . Arthritis   . Carotid artery occlusion   . GERD (gastroesophageal reflux disease)   . Headache    "sometimes monthly" (06/29/2016)  . Heart block   . History of blood transfusion    "while in the service"  . History of stomach ulcers   . Hypertension   . Insomnia   . Pancreatitis   . Prostate cancer ([place])    Archie Endo 09/02/2010  . Prostate enlargement   . PVC's (premature ventricular contractions)   . PVD (peripheral vascular disease) ([place])   . Rhabdomyolysis 06/2016  . Stroke ([place]) 05/2013; 06/29/2016   Archie Endo 05/24/2013  . Type II diabetes mellitus ([place]) dx'd 2008   /notes 08/19/2010    Allergies as of 07/12/2016      Reactions   Penicillins Rash   Has patient had a PCN reaction causing immediate rash, facial/tongue/throat swelling, SOB or lightheadedness with hypotension: No Has patient had a PCN reaction causing severe rash involving mucus membranes or skin necrosis: No Has patient had a PCN reaction that required hospitalization No Has patient had a PCN reaction occurring within the last 10 years: No If all of the above answers are "NO", then may proceed with Cephalosporin use.      Medication List    TAKE these medications   aspirin 325 MG tablet Take 1 tablet (325 mg total) by mouth daily.   atorvastatin 10 MG tablet Commonly known as:  LIPITOR Take 1 tablet (10 mg total) by mouth daily at 6 PM.   bisacodyl 10 MG suppository Commonly known as:  DULCOLAX Place 10 mg rectally daily as  needed for moderate constipation (for constipation not relieved by milk of magnesium).   clopidogrel 75 MG tablet Commonly known as:  PLAVIX Take 1 tablet (75 mg total) by mouth daily.   ergocalciferol 50000 units capsule Commonly known as:  VITAMIN D2 Take 50,000 Units by mouth every Friday.   fluticasone 50 MCG/ACT nasal spray Commonly known as:  FLONASE Place 1 spray into both nostrils daily as needed for allergies or rhinitis.   glimepiride 4 MG tablet Commonly known as:  AMARYL Take 4 mg by mouth daily with breakfast.  magnesium hydroxide 400 MG/5ML suspension Commonly known as:  MILK OF MAGNESIA Take 30 mLs by mouth daily as needed (for constipation).   Melatonin 3 MG Tabs Take by mouth.   metFORMIN 500 MG tablet Commonly known as:  GLUCOPHAGE Take 500 mg by mouth 2 (two) times daily.   multivitamin with minerals Tabs tablet Take 1 tablet by mouth daily.   NAMENDA XR 28 MG Cp24 24 hr capsule Generic drug:  memantine Take 28 mg by mouth daily.   omeprazole 20 MG capsule Commonly known as:  PRILOSEC Take 20 mg by mouth daily at 6 (six) AM.   pentoxifylline 400 MG CR tablet Commonly known as:  TRENTAL Take 400 mg by mouth 3 (three) times daily with meals.   potassium chloride 20 MEQ/15ML (10%) Soln Take 15 mLs (20 mEq total) by mouth daily.   RA SALINE ENEMA RE Place 1 Bottle rectally daily as needed (for constipation not relieved by MOM or biscodyl suppository).   sertraline 50 MG tablet Commonly known as:  ZOLOFT Take 50 mg by mouth daily.   tamsulosin 0.4 MG Caps capsule Commonly known as:  FLOMAX Take 0.4 mg by mouth daily at 6 PM.   traMADol 50 MG tablet Commonly known as:  ULTRAM Take 1 tablet (50 mg total) by mouth every 12 (twelve) hours as needed.   zolpidem 5 MG tablet Commonly known as:  AMBIEN Take 5 mg by mouth at bedtime as needed for sleep.       Prescriptions given: Tramadol 50mg  bid prn #4 No Refill  Instructions: 1.  No  driving x 2 weeks & while taking pain medication 2.  No heavy lifting x 2 weeks 3.  Shower daily with soap and water starting 07/12/16  Disposition: SNF  Patient's condition: is Good  Follow up: 1. Dr. [name] in 2 weeks.   Leontine Locket, PA-C [facility] [phone]   --- For [facility] use ---   Modified Rankin score at D/C (0-6): 0  IV medication needed for:  1. Hypertension: No 2. Hypotension: No  Post-op Complications: No  1. Post-op CVA or TIA: No  If yes: Event classification (right eye, left eye, right cortical, left cortical, verterobasilar, other): n/a  If yes: Timing of event (intra-op, <6 hrs post-op, >=6 hrs post-op, unknown): n/a  2. CN injury: No  If yes: CN n/a injuried   3. Myocardial infarction: No  If yes: Dx by (EKG or clinical, Troponin): n/a  4.  CHF: No  5.  Dysrhythmia (new): No  6. Wound infection: No  7. Reperfusion symptoms: No  8. Return to OR: No  If yes: return to OR for (bleeding, neurologic, other CEA incision, other): n/a  Discharge medications: Statin use:  Yes   If No:   ASA use:  Yes  If No:   Beta blocker use:  No ACE-Inhibitor use:  No  ARB use:  No CCB use: No P2Y12 Antagonist use: Yes, [x ] Plavix, [ ]  Plasugrel, [ ]  Ticlopinine, [ ]  Ticagrelor, [ ]  Other, [ ]  No for medical reason, [ ]  Non-compliant, [ ]  Not-indicated Anti-coagulant use:  No, [ ]  Warfarin, [ ]  Rivaroxaban, [ ]  Dabigatran,

## 2016-07-13 ENCOUNTER — Encounter: Payer: Self-pay | Admitting: Internal Medicine

## 2016-07-13 ENCOUNTER — Non-Acute Institutional Stay (SKILLED_NURSING_FACILITY): Payer: Medicare Other | Admitting: Internal Medicine

## 2016-07-13 DIAGNOSIS — D72829 Elevated white blood cell count, unspecified: Secondary | ICD-10-CM | POA: Diagnosis not present

## 2016-07-13 DIAGNOSIS — I639 Cerebral infarction, unspecified: Secondary | ICD-10-CM

## 2016-07-13 NOTE — Assessment & Plan Note (Signed)
Continue dual anti-platelet therapy.

## 2016-07-13 NOTE — Progress Notes (Signed)
Facility Location: [facility]  Room Number: 782  Code Status:   PCP: [name], MD [place]  [address] [address]  This is a nursing facility follow up for Zachary [name] readmission within 30 days  Interim medical record and care since last [place] visit was updated with review of diagnostic studies and change in clinical status since last visit were documented.  HPI: Zachary [name] was hospitalized 3/23-3/26/18 with left ICA stenosis and left brain stroke. [facility] [name] [facility] from [facility] after an unwitnessed fall with reported slurred speech and right arm weakness. Zachary [name] was on dual therapy with aspirin and Plavix. Initially head CT was negative. MRI revealed multiple small foci of acute ischemic infarcts on Zachary left. Left carotid endarterectomy was performed 07/09/16. Postoperative drain was left in for 48 hours. Zachary [name] was swallowing without difficulty and was discharged to [facility]. Dual antiplatelet continued. Zachary [name] had been on Trental ,but this was discontinued when [facility] [name] was admitted to [facility] initially. Labs 07/12/16 demonstrate leukocytosis with a white count of 16,600 &  left shift. Zachary [name] has a mild anemia with hemoglobin 10.2, this is improved from a value of 9.6. Zachary [name] is on oral area for diabetes, fasting glucose was 111. Renal function was normal  Past history includes endarterectomy on Zachary right in 2015. Zachary [name] has vascular dementia. Zachary [name] is on a statin for dyslipidemia. Zachary [name] is a former smoker.   Review of systems: Dementia invalidated responses. Date given as 09/15/2012 Zachary [name] was unable to give his wedding anniversary. In fact Zachary [name] and his wife argued as to Zachary correct date , one saying May and Zachary other stating November. Zachary [name] did identify [facility] president. Zachary [name] does describe some fatigue. Zachary [name] has office scant sputum. Zachary [name] does not visualize it. Slight elevated white count, Zachary [name] denies any constitutional or infectious  symptoms.  Constitutional: No fever,significant weight change Eyes: No redness, discharge, pain, vision change ENT/mouth: No nasal congestion,  purulent discharge, earache,change in hearing ,sore throat  Cardiovascular: No chest pain, palpitations,paroxysmal nocturnal dyspnea, claudication, edema  Respiratory: No hemoptysis, DOE , significant snoring,apnea  Gastrointestinal: No heartburn,dysphagia,abdominal pain, nausea / vomiting,rectal bleeding, melena,change in bowels Genitourinary: No dysuria,hematuria, pyuria,  incontinence, nocturia Musculoskeletal: No joint stiffness, joint swelling, weakness,pain Dermatologic: No rash, pruritus, change in appearance of skin Neurologic: No dizziness,headache,syncope, seizures, numbness , tingling Psychiatric: No significant anxiety , depression, insomnia, anorexia Endocrine: No change in hair/skin/ nails, excessive thirst, excessive hunger, excessive urination  Hematologic/lymphatic: No significant bruising, lymphadenopathy,abnormal bleeding Allergy/immunology: No itchy/ watery eyes, significant sneezing, urticaria, angioedema  Physical exam:  Pertinent or positive findings: Zachary [name] appears younger than his stated age. Zachary right pupil is ectopic and distorted surgically. Zachary left pupil is pinpoint. Breath sounds are decreased. Heart sounds are distant and irregular. Abdomen is protuberant. Zachary [name] has mixed arthritic changes in Zachary hands. Zachary [name] has some isolated flexion contractures of Zachary fingers. Pedal pulses are decreased. Eschars present of Zachary left carotid endarterectomy scar. See no signs of cellulitis or purulence.   General appearance:Adequately nourished; no acute distress , increased work of breathing is present.   Lymphatic: No lymphadenopathy about Zachary head, neck, axilla . Eyes: No conjunctival inflammation or lid edema is present. There is no scleral icterus. Ears:  External ear exam shows no significant lesions or deformities.   Nose:  External  nasal examination shows no deformity or inflammation. Nasal mucosa are pink and moist without lesions ,exudates Oral exam: lips and  gums are healthy appearing.There is no oropharyngeal erythema or exudate . Neck:  No thyromegaly, masses, tenderness noted.    Heart:  Normal rate and regular rhythm. S1 and S2 normal without gallop, murmur, click, rub .  Lungs:Chest clear to auscultation without wheezes, rhonchi,rales , rubs. Abdomen:Bowel sounds are normal. Abdomen is soft and nontender with no organomegaly, hernias,masses. GU: deferred  Extremities:  No cyanosis, clubbing,edema  Neurologic exam : Cn 2-7 intact Strength equal  in upper & lower extremities Balance,Rhomberg,finger to nose testing could not be completed due to clinical state Deep tendon reflexes are equal Skin: Warm & dry w/o tenting. No significant lesions or rash.  See summary under each active problem in [facility] Problem List with associated updated therapeutic plan

## 2016-07-13 NOTE — Patient Instructions (Signed)
See assessment and plan under each diagnosis in the problem list and acutely for this visit 

## 2016-07-13 NOTE — Assessment & Plan Note (Signed)
Monitor for any active infection; most importantly there is no sign of infection at the operative site of the left neck

## 2016-07-14 ENCOUNTER — Ambulatory Visit (INDEPENDENT_AMBULATORY_CARE_PROVIDER_SITE_OTHER): Payer: Medicare Other | Admitting: Neurology

## 2016-07-14 ENCOUNTER — Encounter: Payer: Self-pay | Admitting: Neurology

## 2016-07-14 VITALS — BP 134/72 | HR 88

## 2016-07-14 DIAGNOSIS — I639 Cerebral infarction, unspecified: Secondary | ICD-10-CM | POA: Diagnosis not present

## 2016-07-14 DIAGNOSIS — G214 Vascular parkinsonism: Secondary | ICD-10-CM

## 2016-07-14 DIAGNOSIS — E1165 Type 2 diabetes mellitus with hyperglycemia: Secondary | ICD-10-CM

## 2016-07-14 DIAGNOSIS — E1140 Type 2 diabetes mellitus with diabetic neuropathy, unspecified: Secondary | ICD-10-CM | POA: Diagnosis not present

## 2016-07-14 DIAGNOSIS — I63132 Cerebral infarction due to embolism of left carotid artery: Secondary | ICD-10-CM

## 2016-07-14 DIAGNOSIS — IMO0002 Reserved for concepts with insufficient information to code with codable children: Secondary | ICD-10-CM

## 2016-07-15 ENCOUNTER — Encounter: Payer: Self-pay | Admitting: Podiatry

## 2016-07-15 ENCOUNTER — Ambulatory Visit (INDEPENDENT_AMBULATORY_CARE_PROVIDER_SITE_OTHER): Payer: Medicare Other | Admitting: Podiatry

## 2016-07-15 DIAGNOSIS — B351 Tinea unguium: Secondary | ICD-10-CM

## 2016-07-15 DIAGNOSIS — M79605 Pain in left leg: Secondary | ICD-10-CM | POA: Diagnosis not present

## 2016-07-15 DIAGNOSIS — M79604 Pain in right leg: Secondary | ICD-10-CM | POA: Diagnosis not present

## 2016-07-15 DIAGNOSIS — I639 Cerebral infarction, unspecified: Secondary | ICD-10-CM | POA: Diagnosis not present

## 2016-07-15 NOTE — Progress Notes (Signed)
Subjective:     Patient ID: Zachary [name], male   DOB: June 19, 1921, 83 y.o.   MRN: [id number]  HPI patient presents with caregiver with thick and incurvated nailbeds 1-5 both feet that they cannot cut and they're painful with shoe gear   Review of Systems  All other systems reviewed and are negative.      Objective:   Physical Exam  Constitutional: He is oriented to person, place, and time.  Cardiovascular: Intact distal pulses.   Musculoskeletal: Normal range of motion.  Neurological: He is oriented to person, place, and time.  Skin: Skin is warm and dry.  Nursing note and vitals reviewed.  vascular status was found to be depressed but intact for his age with diminished range of motion subtalar midtarsal joint and diminished muscle strength. He is noted to have severely thickened incurvated nailbeds 1-5 both feet that are painful when pressed with inability to wear shoe gear comfortably     Assessment:     At risk individual with mycotic nail infection 1-5 both feet with pain    Plan:     Debride painful nailbeds 1-5 both feet with no iatrogenic bleeding noted

## 2016-07-20 ENCOUNTER — Ambulatory Visit (INDEPENDENT_AMBULATORY_CARE_PROVIDER_SITE_OTHER): Payer: Self-pay | Admitting: Vascular Surgery

## 2016-07-20 ENCOUNTER — Encounter: Payer: Self-pay | Admitting: Vascular Surgery

## 2016-07-20 VITALS — BP 122/51 | HR 77 | Temp 96.9°F | Resp 20 | Ht 70.0 in | Wt 185.0 lb

## 2016-07-20 DIAGNOSIS — I6523 Occlusion and stenosis of bilateral carotid arteries: Secondary | ICD-10-CM

## 2016-07-20 NOTE — Progress Notes (Signed)
   Patient name: Zachary [name] MRN: [id number] DOB: 06-Dec-1921 Sex: male  REASON FOR VISIT: Follow-up left carotid endarterectomy for symptomatic carotid disease on 07/09/2016  HPI: Zachary [name] is a 83 y.o. male here for follow-up. He is here with his daughter. He is at [employer]. He looks quite good today. He has had no neurologic deficits.  Current Outpatient Prescriptions  Medication Sig Dispense Refill  . aspirin 325 MG tablet Take 1 tablet (325 mg total) by mouth daily. 30 tablet 0  . atorvastatin (LIPITOR) 10 MG tablet Take 1 tablet (10 mg total) by mouth daily at 6 PM.    . bisacodyl (DULCOLAX) 10 MG suppository Place 10 mg rectally daily as needed for moderate constipation (for constipation not relieved by milk of magnesium).    Marland Kitchen clopidogrel (PLAVIX) 75 MG tablet Take 1 tablet (75 mg total) by mouth daily. 30 tablet 0  . ergocalciferol (VITAMIN D2) 50000 UNITS capsule Take 50,000 Units by mouth every Friday.     . fluticasone (FLONASE) 50 MCG/ACT nasal spray Place 1 spray into both nostrils daily as needed for allergies or rhinitis.    Marland Kitchen glimepiride (AMARYL) 2 MG tablet Take 4 mg by mouth daily with breakfast.     . magnesium hydroxide (MILK OF MAGNESIA) 400 MG/5ML suspension Take 30 mLs by mouth daily as needed (for constipation).    . Melatonin 3 MG TABS Take by mouth.    . Memantine HCl ER (NAMENDA XR) 28 MG CP24 Take 28 mg by mouth daily.     . metFORMIN (GLUCOPHAGE) 500 MG tablet Take 500 mg by mouth 2 (two) times daily.     . Multiple Vitamin (MULTIVITAMIN WITH MINERALS) TABS Take 1 tablet by mouth daily.    Marland Kitchen omeprazole (PRILOSEC) 20 MG capsule Take 20 mg by mouth daily at 6 (six) AM.     . pentoxifylline (TRENTAL) 400 MG CR tablet     . potassium chloride 20 MEQ/15ML (10%) SOLN Take 15 mLs (20 mEq total) by mouth daily. 450 mL 0  . sertraline (ZOLOFT) 50 MG tablet Take 50 mg by mouth daily.    . Tamsulosin HCl  (FLOMAX) 0.4 MG CAPS Take 0.4 mg by mouth daily at 6 PM.     . traMADol (ULTRAM) 50 MG tablet Take 1 tablet (50 mg total) by mouth every 12 (twelve) hours as needed. 4 tablet 0  . zolpidem (AMBIEN) 5 MG tablet      No current facility-administered medications for this visit.      PHYSICAL EXAM: Vitals:   07/20/16 1311 07/20/16 1318  BP: (!) 150/55 (!) 122/51  Pulse: 77   Resp: 20   Temp: (!) 96.9 F (36.1 C)   TempSrc: Oral   SpO2: 95%   Weight: 185 lb (83.9 kg)   Height: 5\' 10"  (1.778 m)     GENERAL: The patient is a well-nourished male, in no acute distress. The vital signs are documented above. Left neck incision is healing quite nicely with no tenderness.  MEDICAL ISSUES: Stable status post left carotid endarterectomy. He will continue full activities. Daughter has multiple questions regarding need for his multiple medications and she will address this with his primary care provider. He will see [place] again in 6 months with carotid duplex follow-up   Rosetta Posner, MD [facility] Tel [phone] Pager [phone]

## 2016-07-21 ENCOUNTER — Encounter: Payer: Self-pay | Admitting: Nurse Practitioner

## 2016-07-21 ENCOUNTER — Non-Acute Institutional Stay (SKILLED_NURSING_FACILITY): Payer: Medicare Other | Admitting: Nurse Practitioner

## 2016-07-21 DIAGNOSIS — I63132 Cerebral infarction due to embolism of left carotid artery: Secondary | ICD-10-CM | POA: Diagnosis not present

## 2016-07-21 DIAGNOSIS — I6522 Occlusion and stenosis of left carotid artery: Secondary | ICD-10-CM

## 2016-07-21 DIAGNOSIS — F0150 Vascular dementia without behavioral disturbance: Secondary | ICD-10-CM

## 2016-07-21 DIAGNOSIS — Z7189 Other specified counseling: Secondary | ICD-10-CM | POA: Diagnosis not present

## 2016-07-21 DIAGNOSIS — E118 Type 2 diabetes mellitus with unspecified complications: Secondary | ICD-10-CM | POA: Diagnosis not present

## 2016-07-21 NOTE — Progress Notes (Signed)
Nursing Home Location: [facility] Room: 311 A   Place of Service: SNF (31)  PCP: [name], MD  Allergies  Allergen Reactions  . Penicillins Rash    Has patient had a PCN reaction causing immediate rash, facial/tongue/throat swelling, SOB or lightheadedness with hypotension: No Has patient had a PCN reaction causing severe rash involving mucus membranes or skin necrosis: No Has patient had a PCN reaction that required hospitalization No Has patient had a PCN reaction occurring within the last 10 years: No If all of the above answers are "NO", then may proceed with Cephalosporin use.    Chief Complaint  Patient presents with  . Acute Visit    Resident is being seen due concerns of the family.     HPI:  Patient is a 83 y.o. male seen today at [facility] to discuss medications. Pt is at [facility] after hospitalized 3/23-3/26/18 with left ICA stenosis and left brain stroke. He was sent from the SNF after an unwitnessed fall with reported slurred speech and right arm weakness. Currently s/p left carotid endarterectomy following with vascular. Daughter reports pharmacy recently called her due to medications being delivered to his home that he has not been taking. Reports he has never wanted to be on medication and did not take what was prescribed when he was home. Pt with hx of dementia and now CVA, Looking to minimize medication and comfort as an approach going foward.   Review of Systems:  Review of Systems  Unable to perform ROS: Dementia    Past Medical History:  Diagnosis Date  . Arthritis   . Carotid artery occlusion   . GERD (gastroesophageal reflux disease)   . Headache    "sometimes monthly" (06/29/2016)  . Heart block   . History of blood transfusion    "while in the service"  . History of stomach ulcers   . Hypertension   . Insomnia   . Pancreatitis   . Prostate cancer ([place])    Archie Endo 09/02/2010  . Prostate enlargement   .  PVC's (premature ventricular contractions)   . PVD (peripheral vascular disease) ([place])   . Rhabdomyolysis 06/2016  . Stroke ([place]) 05/2013; 06/29/2016   Archie Endo 05/24/2013  . Type II diabetes mellitus ([place]) dx'd 2008   Archie Endo 08/19/2010   Past Surgical History:  Procedure Laterality Date  . CATARACT EXTRACTION W/ INTRAOCULAR LENS  IMPLANT, BILATERAL Bilateral   . CHOLECYSTECTOMY  07/18/2011   Procedure: LAPAROSCOPIC CHOLECYSTECTOMY;  Surgeon: Gwenyth Ober, MD;  Location: [place];  Service: General;  Laterality: N/A;  . ENDARTERECTOMY Right 05/28/2013   Procedure: ENDARTERECTOMY CAROTID;  Surgeon: Rosetta Posner, MD;  Location: [place];  Service: Vascular;  Laterality: Right;  . ENDARTERECTOMY Left 07/09/2016   Procedure: ENDARTERECTOMY CAROTID;  Surgeon: Rosetta Posner, MD;  Location: [place];  Service: Vascular;  Laterality: Left;  . ESOPHAGOGASTRODUODENOSCOPY (EGD) WITH ESOPHAGEAL DILATION  2004; 12/2009   Archie Endo 01/08/2010  . FLEXIBLE SIGMOIDOSCOPY N/A 09/23/2014   Procedure: FLEXIBLE SIGMOIDOSCOPY;  Surgeon: Garlan Fair, MD;  Location: [facility];  Service: Endoscopy;  Laterality: N/A;  . INSERTION PROSTATE RADIATION SEED  03/1999   Archie Endo 09/02/2010  . PATCH ANGIOPLASTY Left 07/09/2016   Procedure: PATCH ANGIOPLASTY LEFT CAROTID ARTERY;  Surgeon: Rosetta Posner, MD;  Location: [place];  Service: Vascular;  Laterality: Left;  . PROSTATE BIOPSY  2000   Archie Endo 09/02/2010  . TONSILLECTOMY     Social History:   reports  that he quit smoking about 54 years ago. He has a 60.00 pack-year smoking history. He has never used smokeless tobacco. He reports that he does not drink alcohol or use drugs.  Family History  Problem Relation Age of Onset  . Breast cancer Daughter   . Cancer Daughter 27    died of cancer   . Stroke Father   . Other      hypogonadism  . Heart attack Neg Hx   . Hypertension Neg Hx     Medications: Patient's Medications  New Prescriptions   No medications on file  Previous  Medications   ASPIRIN 325 MG TABLET    Take 1 tablet (325 mg total) by mouth daily.   ATORVASTATIN (LIPITOR) 10 MG TABLET    Take 1 tablet (10 mg total) by mouth daily at 6 PM.   BISACODYL (DULCOLAX) 10 MG SUPPOSITORY    Place 10 mg rectally daily as needed for moderate constipation (for constipation not relieved by milk of magnesium).   CLOPIDOGREL (PLAVIX) 75 MG TABLET    Take 1 tablet (75 mg total) by mouth daily.   ERGOCALCIFEROL (VITAMIN D2) 50000 UNITS CAPSULE    Take 50,000 Units by mouth every Friday.    FLUTICASONE (FLONASE) 50 MCG/ACT NASAL SPRAY    Place 1 spray into both nostrils daily as needed for allergies or rhinitis.   GLIMEPIRIDE (AMARYL) 2 MG TABLET    Take 2 mg by mouth daily with breakfast.    MAGNESIUM HYDROXIDE (MILK OF MAGNESIA) 400 MG/5ML SUSPENSION    Take 30 mLs by mouth daily as needed (for constipation).   MELATONIN 3 MG TABS    Take 1 tablet by mouth at bedtime.    MEMANTINE HCL ER (NAMENDA XR) 28 MG CP24    Take 28 mg by mouth daily.    METFORMIN (GLUCOPHAGE) 500 MG TABLET    Take 500 mg by mouth 2 (two) times daily.    MULTIPLE VITAMIN (MULTIVITAMIN WITH MINERALS) TABS    Take 1 tablet by mouth daily.   OMEPRAZOLE (PRILOSEC) 20 MG CAPSULE    Take 20 mg by mouth daily at 6 (six) AM.    POTASSIUM CHLORIDE 20 MEQ/15ML (10%) SOLN    Take 15 mLs (20 mEq total) by mouth daily.   SERTRALINE (ZOLOFT) 50 MG TABLET    Take 50 mg by mouth daily.   TAMSULOSIN HCL (FLOMAX) 0.4 MG CAPS    Take 0.4 mg by mouth daily at 6 PM.    TRAMADOL (ULTRAM) 50 MG TABLET    Take 1 tablet (50 mg total) by mouth every 12 (twelve) hours as needed.  Modified Medications   No medications on file  Discontinued Medications   PENTOXIFYLLINE (TRENTAL) 400 MG CR TABLET       ZOLPIDEM (AMBIEN) 5 MG TABLET         Physical Exam: Vitals:   07/21/16 1051  BP: 122/68  Pulse: (!) 55  Resp: 20  Temp: 98 F (36.7 C)  SpO2: 98%  Weight: 185 lb (83.9 kg)  Height: 5\' 10"  (1.778 m)    Physical  Exam  Constitutional: He is oriented to person, place, and time. He appears well-developed and well-nourished. No distress.  HENT:  Head: Normocephalic and atraumatic.  Mouth/Throat: Oropharynx is clear and moist. No oropharyngeal exudate.  Eyes: Conjunctivae are normal.  The right pupil is irregular. The left pupil is pinpoint.  Neck: Normal range of motion. Neck supple.  Eschars present of the left  carotid endarterectomy scar  Cardiovascular: Normal rate and normal heart sounds.  An irregular rhythm present.  Pulmonary/Chest: Effort normal and breath sounds normal.  Abdominal: Soft. Bowel sounds are normal.  Musculoskeletal: He exhibits no edema or tenderness.  Neurological: He is alert and oriented to person, place, and time.  Skin: Skin is warm and dry. He is not diaphoretic.  Psychiatric: He has a normal mood and affect.    Labs reviewed: Basic Metabolic Panel:  Recent Labs  08/05/15 0448  06/29/16 1413  07/02/16 0852 07/04/16 1546 07/09/16 0632  07/09/16 1017 07/10/16 0306 07/12/16  NA 142  < >  --   < > 141 142 140  < > 142 140 142  K 4.0  < >  --   < > 3.7 3.7 3.9  < > 3.6 4.0 4.4  CL 107  < >  --   < > 104 105 103  --   --  105  --   CO2 25  < >  --   < > 26  --  27  --   --  29  --   GLUCOSE 123*  < >  --   < > 108* 100* 101*  --   --  141*  --   BUN 15  < >  --   < > 6 17 13   --   --  9 14  CREATININE 1.13  < >  --   < > 0.88 1.10 1.13  --   --  0.94 0.9  CALCIUM 8.9  < >  --   < > 8.8*  --  8.9  --   --  8.4*  --   MG 2.1  --  1.8  --   --   --   --   --   --   --   --   PHOS 3.2  --   --   --   --   --   --   --   --   --   --   < > = values in this interval not displayed. Liver Function Tests:  Recent Labs  08/06/15 0439 06/29/16 1132 06/30/16 0608  AST 23 108* 94*  ALT 16* 30 31  ALKPHOS 66 60 50  BILITOT 0.7 1.5* 1.4*  PROT 6.2* 6.8 5.8*  ALBUMIN 3.1* 3.7 3.0*   No results for input(s): LIPASE, AMYLASE in the last 8760 hours. No results for  input(s): AMMONIA in the last 8760 hours. CBC:  Recent Labs  08/06/15 0439 06/29/16 1132  06/30/16 0608  07/09/16 0632  07/09/16 1201 07/10/16 0306 07/12/16  WBC 9.7 22.0*  --  14.9*  < > 12.8*  --  17.7* 12.7* 16.6  NEUTROABS 5.7 16.5*  --  9.5*  --   --   --   --   --   --   HGB 13.5 16.3  < > 14.0  < > 14.0  < > 9.7* 9.6* 10.2*  HCT 40.8 46.4  < > 40.5  < > 42.0  < > 28.8* 29.1* 30*  MCV 93.8 92.1  --  91.4  < > 93.8  --  93.2 94.8  --   PLT 225 277  --  219  < > 293  --  226 233 267  < > = values in this interval not displayed. TSH:  Recent Labs  08/05/15 0448  TSH 3.779   A1C: Lab Results  Component  Value Date   HGBA1C 9.5 (H) 06/30/2016   Lipid Panel:  Recent Labs  06/30/16 0608  CHOL 147  HDL 25*  LDLCALC 99  TRIG 113  CHOLHDL 5.9    Radiological Exams: Ct Head Wo Contrast  Result Date: 07/04/2016 CLINICAL DATA:  Right-sided weakness and right facial droop. EXAM: CT HEAD WITHOUT CONTRAST TECHNIQUE: Contiguous axial images were obtained from the base of the skull through the vertex without intravenous contrast. COMPARISON:  06/29/2016 and 08/04/2015, MRI brain 06/29/2016 FINDINGS: Brain: Ventricles and cisterns are within normal. CSF spaces are within normal. Evidence of chronic ischemic microvascular disease. Small old right posterior parietal/ occipital infarct. No mass, mass effect, shift of midline structures or acute hemorrhage. The small foci of acute ischemia seen on previous MRI are not well seen by CT. Vascular: Calcified plaque over the cavernous segment of the internal carotid arteries and left vertebral artery. Skull: Within normal. Sinuses/Orbits: Within normal. Other: None. IMPRESSION: No acute intracranial findings. Previously seen small foci of acute ischemic change over the left convexity by MRI are not apparent by CT. Chronic ischemic microvascular disease. Small old right posterior parietal/ occipital infarct. Electronically Signed   By: Marin Olp M.D.   On: 07/04/2016 16:08  Assessment/Plan 1. Vascular dementia without behavioral disturbance Progressive worsening dementia, daughter would like to minimize medications at this time because he was not taking medications at home and she feels like once [facility] leaves [facility] will not take medication. Will stop Vit D, potassium, Lipitor, Amaryl and metformin at this time.  Will follow up BMP in 1 week due to stopping potassium  2. Diabetes mellitus with complication [facility]) Daughter aware of A1c, will stop amaryl and metformin at this time to decrease pill burden and have palliative approach.   3. Stenosis of left carotid artery s/p left carotid endarterectomy, following with vascular at this time  4. 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 Goals of care, counseling/discussion Will get palliative care consult at this time. Called contact on chart who is pts son-in-law to update him of changes, did not answer but to call [facility] for update.    Carlos American. Harle Battiest  [facility] [phone] 8 am - 5 pm) [phone] (after hours)

## 2016-07-26 DIAGNOSIS — R531 Weakness: Secondary | ICD-10-CM | POA: Diagnosis not present

## 2016-07-28 LAB — BASIC METABOLIC PANEL
BUN: 29 mg/dL — AB (ref 4–21)
Creatinine: 0.9 mg/dL (ref 0.6–1.3)
Glucose: 113 mg/dL
Potassium: 3.9 mmol/L (ref 3.4–5.3)
Sodium: 142 mmol/L (ref 137–147)

## 2016-08-18 ENCOUNTER — Encounter: Payer: Self-pay | Admitting: Nurse Practitioner

## 2016-08-18 ENCOUNTER — Non-Acute Institutional Stay (SKILLED_NURSING_FACILITY): Payer: Medicare Other | Admitting: Nurse Practitioner

## 2016-08-18 DIAGNOSIS — F0150 Vascular dementia without behavioral disturbance: Secondary | ICD-10-CM

## 2016-08-18 DIAGNOSIS — R8290 Unspecified abnormal findings in urine: Secondary | ICD-10-CM | POA: Diagnosis not present

## 2016-08-18 DIAGNOSIS — I6522 Occlusion and stenosis of left carotid artery: Secondary | ICD-10-CM

## 2016-08-18 DIAGNOSIS — R296 Repeated falls: Secondary | ICD-10-CM

## 2016-08-18 DIAGNOSIS — F339 Major depressive disorder, recurrent, unspecified: Secondary | ICD-10-CM

## 2016-08-18 NOTE — Progress Notes (Signed)
Nursing Home Location: [facility] Room: 311 A   Place of Service: SNF (31)  PCP: [name], MD  Allergies  Allergen Reactions  . Penicillins Rash    Has patient had a PCN reaction causing immediate rash, facial/tongue/throat swelling, SOB or lightheadedness with hypotension: No Has patient had a PCN reaction causing severe rash involving mucus membranes or skin necrosis: No Has patient had a PCN reaction that required hospitalization No Has patient had a PCN reaction occurring within the last 10 years: No If all of the above answers are "NO", then may proceed with Cephalosporin use.    Chief Complaint  Patient presents with  . Medical Management of Chronic Issues    routine visit    HPI:  Patient is a 83 y.o. male seen today at [facility] for routine follow up. Pt with hx of left ICA stenosis and left brain stroke s/p left carotid endarterectomy, DM, dementia, anxiety/depression, hyperlipidemia. Pt was seen last month at daughters request due to pill burden and wishes to change to palliative approach as he is 83 years old and does not want aggressive measures. Multiple medications stopped.  Staff noted multiple falls and UA C&S sent. UA abnormal with trace blood and bacteria. Awaiting culture. Pt without fever. Unable to contribute to HPI or ROS due to dementia.   Review of Systems:  Review of Systems  Unable to perform ROS: Dementia    Past Medical History:  Diagnosis Date  . Arthritis   . Carotid artery occlusion   . GERD (gastroesophageal reflux disease)   . Headache    "sometimes monthly" (06/29/2016)  . Heart block   . History of blood transfusion    "while in the service"  . History of stomach ulcers   . Hypertension   . Insomnia   . Pancreatitis   . Prostate cancer ([place])    Archie Endo 09/02/2010  . Prostate enlargement   . PVC's (premature ventricular contractions)   . PVD (peripheral vascular disease) ([place])   .  Rhabdomyolysis 06/2016  . Stroke ([place]) 05/2013; 06/29/2016   Archie Endo 05/24/2013  . Type II diabetes mellitus ([place]) dx'd 2008   Archie Endo 08/19/2010   Past Surgical History:  Procedure Laterality Date  . CATARACT EXTRACTION W/ INTRAOCULAR LENS  IMPLANT, BILATERAL Bilateral   . CHOLECYSTECTOMY  07/18/2011   Procedure: LAPAROSCOPIC CHOLECYSTECTOMY;  Surgeon: Gwenyth Ober, MD;  Location: [place];  Service: General;  Laterality: N/A;  . ENDARTERECTOMY Right 05/28/2013   Procedure: ENDARTERECTOMY CAROTID;  Surgeon: Rosetta Posner, MD;  Location: [place];  Service: Vascular;  Laterality: Right;  . ENDARTERECTOMY Left 07/09/2016   Procedure: ENDARTERECTOMY CAROTID;  Surgeon: Rosetta Posner, MD;  Location: [place];  Service: Vascular;  Laterality: Left;  . ESOPHAGOGASTRODUODENOSCOPY (EGD) WITH ESOPHAGEAL DILATION  2004; 12/2009   Archie Endo 01/08/2010  . FLEXIBLE SIGMOIDOSCOPY N/A 09/23/2014   Procedure: FLEXIBLE SIGMOIDOSCOPY;  Surgeon: Garlan Fair, MD;  [facility];  Service: Endoscopy;  Laterality: N/A;  . INSERTION PROSTATE RADIATION SEED  03/1999   Archie Endo 09/02/2010  . PATCH ANGIOPLASTY Left 07/09/2016   Procedure: PATCH ANGIOPLASTY LEFT CAROTID ARTERY;  Surgeon: Rosetta Posner, MD;  Location: [place];  Service: Vascular;  Laterality: Left;  . PROSTATE BIOPSY  2000   Archie Endo 09/02/2010  . TONSILLECTOMY     Social History:   reports that he quit smoking about 54 years ago. He has a 60.00 pack-year smoking history. He has never used smokeless  tobacco. He reports that he does not drink alcohol or use drugs.  Family History  Problem Relation Age of Onset  . Breast cancer Daughter   . Cancer Daughter 30    died of cancer   . Stroke Father   . Other      hypogonadism  . Heart attack Neg Hx   . Hypertension Neg Hx     Medications: Patient's Medications  New Prescriptions   No medications on file  Previous Medications   ASPIRIN 325 MG TABLET    Take 1 tablet (325 mg total) by mouth daily.   BISACODYL  (DULCOLAX) 10 MG SUPPOSITORY    Place 10 mg rectally daily as needed for moderate constipation (for constipation not relieved by milk of magnesium).   CLOPIDOGREL (PLAVIX) 75 MG TABLET    Take 1 tablet (75 mg total) by mouth daily.   DIVALPROEX (DEPAKOTE SPRINKLE) 125 MG CAPSULE    Take 125 mg by mouth 3 (three) times daily with meals.   FLUTICASONE (FLONASE) 50 MCG/ACT NASAL SPRAY    Place 1 spray into both nostrils daily as needed for allergies or rhinitis.   GLIMEPIRIDE (AMARYL) 4 MG TABLET    Take 4 mg by mouth daily with breakfast.   MAGNESIUM HYDROXIDE (MILK OF MAGNESIA) 400 MG/5ML SUSPENSION    Take 30 mLs by mouth daily as needed (for constipation).   MELATONIN 3 MG TABS    Take 1 tablet by mouth at bedtime as needed (sleep).    MEMANTINE HCL ER (NAMENDA XR) 28 MG CP24    Take 28 mg by mouth daily.    MULTIPLE VITAMIN (MULTIVITAMIN WITH MINERALS) TABS    Take 1 tablet by mouth daily.   OMEPRAZOLE (PRILOSEC) 20 MG CAPSULE    Take 20 mg by mouth daily at 6 (six) AM.    PENTOXIFYLLINE (TRENTAL) 400 MG CR TABLET    Take 400 mg by mouth 3 (three) times daily with meals.   SERTRALINE (ZOLOFT) 50 MG TABLET    Take 50 mg by mouth daily.   TAMSULOSIN HCL (FLOMAX) 0.4 MG CAPS    Take 0.4 mg by mouth daily at 6 PM.    TRAMADOL (ULTRAM) 50 MG TABLET    Take 1 tablet (50 mg total) by mouth every 12 (twelve) hours as needed.  Modified Medications   No medications on file  Discontinued Medications   ATORVASTATIN (LIPITOR) 10 MG TABLET    Take 1 tablet (10 mg total) by mouth daily at 6 PM.   ERGOCALCIFEROL (VITAMIN D2) 50000 UNITS CAPSULE    Take 50,000 Units by mouth every Friday.    GLIMEPIRIDE (AMARYL) 2 MG TABLET    Take 2 mg by mouth daily with breakfast.    METFORMIN (GLUCOPHAGE) 500 MG TABLET    Take 500 mg by mouth 2 (two) times daily.    POTASSIUM CHLORIDE 20 MEQ/15ML (10%) SOLN    Take 15 mLs (20 mEq total) by mouth daily.     Physical Exam: Vitals:   08/18/16 1400  BP: (!) 145/78    Pulse: 76  Resp: 20  Temp: (!) 96.1 F (35.6 C)  TempSrc: Oral  SpO2: 95%    Physical Exam  Constitutional: He appears well-developed and well-nourished. No distress.  HENT:  Head: Normocephalic and atraumatic.  Mouth/Throat: Oropharynx is clear and moist. No oropharyngeal exudate.  Eyes: Conjunctivae are normal.  The right pupil is irregular. The left pupil is pinpoint.  Neck: Normal range of motion. Neck  supple.  Cardiovascular: Normal rate and normal heart sounds.  An irregular rhythm present.  Pulmonary/Chest: Effort normal and breath sounds normal.  Abdominal: Soft. Bowel sounds are normal.  Musculoskeletal: He exhibits no edema or tenderness.  Neurological: He is alert.  Skin: Skin is warm and dry. He is not diaphoretic.  Psychiatric: He has a normal mood and affect.    Labs reviewed: Basic Metabolic Panel:  Recent Labs  06/29/16 1413  07/02/16 0852 07/04/16 1546 07/09/16 0632  07/10/16 0306 07/12/16 07/28/16  NA  --   < > 141 142 140  < > 140 142 142  K  --   < > 3.7 3.7 3.9  < > 4.0 4.4 3.9  CL  --   < > 104 105 103  --  105  --   --   CO2  --   < > 26  --  27  --  29  --   --   GLUCOSE  --   < > 108* 100* 101*  --  141*  --   --   BUN  --   < > 6 17 13   --  9 14 29*  CREATININE  --   < > 0.88 1.10 1.13  --  0.94 0.9 0.9  CALCIUM  --   < > 8.8*  --  8.9  --  8.4*  --   --   MG 1.8  --   --   --   --   --   --   --   --   < > = values in this interval not displayed. Liver Function Tests:  Recent Labs  06/29/16 1132 06/30/16 0608  AST 108* 94*  ALT 30 31  ALKPHOS 60 50  BILITOT 1.5* 1.4*  PROT 6.8 5.8*  ALBUMIN 3.7 3.0*   No results for input(s): LIPASE, AMYLASE in the last 8760 hours. No results for input(s): AMMONIA in the last 8760 hours. CBC:  Recent Labs  06/29/16 1132  06/30/16 0608  07/09/16 0632  07/09/16 1201 07/10/16 0306 07/12/16  WBC 22.0*  --  14.9*  < > 12.8*  --  17.7* 12.7* 16.6  NEUTROABS 16.5*  --  9.5*  --   --   --   --    --   --   HGB 16.3  < > 14.0  < > 14.0  < > 9.7* 9.6* 10.2*  HCT 46.4  < > 40.5  < > 42.0  < > 28.8* 29.1* 30*  MCV 92.1  --  91.4  < > 93.8  --  93.2 94.8  --   PLT 277  --  219  < > 293  --  226 233 267  < > = values in this interval not displayed. TSH: No results for input(s): TSH in the last 8760 hours. A1C: Lab Results  Component Value Date   HGBA1C 9.5 (H) 06/30/2016   Lipid Panel:  Recent Labs  06/30/16 0608  CHOL 147  HDL 25*  LDLCALC 99  TRIG 113  CHOLHDL 5.9    Radiological Exams: Ct Head Wo Contrast  Result Date: 07/04/2016 CLINICAL DATA:  Right-sided weakness and right facial droop. EXAM: CT HEAD WITHOUT CONTRAST TECHNIQUE: Contiguous axial images were obtained from the base of the skull through the vertex without intravenous contrast. COMPARISON:  06/29/2016 and 08/04/2015, MRI brain 06/29/2016 FINDINGS: Brain: Ventricles and cisterns are within normal. CSF spaces are within normal. Evidence of chronic ischemic  microvascular disease. Small old right posterior parietal/ occipital infarct. No mass, mass effect, shift of midline structures or acute hemorrhage. The small foci of acute ischemia seen on previous MRI are not well seen by CT. Vascular: Calcified plaque over the cavernous segment of the internal carotid arteries and left vertebral artery. Skull: Within normal. Sinuses/Orbits: Within normal. Other: None. IMPRESSION: No acute intracranial findings. Previously seen small foci of acute ischemic change over the left convexity by MRI are not apparent by CT. Chronic ischemic microvascular disease. Small old right posterior parietal/ occipital infarct. Electronically Signed   By: Marin Olp M.D.   On: 07/04/2016 16:08  Assessment/Plan 1. Vascular dementia with behavioral disturbance Advanced dementia, requiring increased level of supervision. Plan is to go home with caregiver once strength has improved. conts on namenda and Depakote for mood.   2. Stenosis of left  carotid artery s/p left carotid endarterectomy, incision has healed, remains on plavix and ASA  3. Frequent falls -ongoing weakness, UA C&S ordered. conts to work with PT in hopes he can return home  4. Abnormal urinalysis -C&S pending.   5. Depression, recurrent (HCC) Stable on zoloft.      Zachary [name]. Harle Battiest  [facility] Adult Medicine [phone] 8 am - 5 pm) [phone] (after hours)

## 2016-08-26 ENCOUNTER — Encounter: Payer: Self-pay | Admitting: Internal Medicine

## 2016-08-26 ENCOUNTER — Non-Acute Institutional Stay (SKILLED_NURSING_FACILITY): Payer: Medicare Other | Admitting: Internal Medicine

## 2016-08-26 DIAGNOSIS — I639 Cerebral infarction, unspecified: Secondary | ICD-10-CM | POA: Diagnosis not present

## 2016-08-26 DIAGNOSIS — E118 Type 2 diabetes mellitus with unspecified complications: Secondary | ICD-10-CM | POA: Diagnosis not present

## 2016-08-26 DIAGNOSIS — F0150 Vascular dementia without behavioral disturbance: Secondary | ICD-10-CM

## 2016-08-26 NOTE — Progress Notes (Signed)
This is a nursing facility follow up for specific issue of medication reconciliation.  Interim medical record and care since last [place] visit was updated with review of diagnostic studies and change in clinical status since last visit were documented.  HPI: The patient had been hospitalized 3/23-3/26/18 with left ICA stenosis and left brain stroke. Left carotid endarterectomy was performed 3/23. He was discharged back to the SNF on dual antiplatelet therapy with Plavix and aspirin. Trental was discontinued on readmission to the facility. He was seen in follow-up by his vascular surgeon Dr. [name], who felt the patient was stable clinically. Recent labs on 07/28/16 revealed creatinine 0.9 which probably reflects lack of muscle mass. He did have mild prerenal azotemia. The BUN was 29. He's exhibited a normochromic, normocytic anemia which has been improving. Neurology visit 07/14/16 with Dr Tat was reviewed. She recommended continuing intervention for risk factors with the dual therapy as noted. Additionally she recommended continuing the Lipitor to keep LDL less than 70. Blood pressure goal was to be less than 130/80. The neurologist questioned that his gait instability may be related to vascular Parkinson's as well as peripheral neuropathy, but there was no definite evidence of idiopathic Parkinson's. Antiparkinsonian location was not recommended because of memory issues. Ms. Dewaine Oats, nurse practitioner met with his daughter April 4, palliative care was to be pursued. This has not occurred as of this date. Today hs daughter is inquiring as to the appropriateness of hospice consultation and minimizing medications as much as possible. Unfortunately she is not the healthcare power of attorney, and input from that individual is necessary to pursue palliative or hospice consultation.  Review of systems: Initially he stated he had a slight cough without sputum production. He also stated  that he has some numbness and weakness in his extremities, but when I was verifying positive symptoms he denied the latter complaint. Veracity of responses is in question as he gave the date as 11/08/1940. He was unable to name the president.  Constitutional: No fever,significant weight change, fatigue  Eyes: No redness, discharge, pain, vision change ENT/mouth: No nasal congestion,  purulent discharge, earache,change in hearing ,sore throat  Cardiovascular: No chest pain, palpitations,paroxysmal nocturnal dyspnea, claudication, edema   Gastrointestinal: No heartburn,dysphagia,abdominal pain, nausea / vomiting,rectal bleeding, melena,change in bowels Genitourinary: No dysuria,hematuria, pyuria,  incontinence, nocturia Musculoskeletal: No joint stiffness, joint swelling, pain Dermatologic: No rash, pruritus, change in appearance of skin Neurologic: No dizziness,headache Psychiatric: No significant anxiety , depression, insomnia, anorexia Endocrine: No change in hair/skin/ nails, excessive thirst, excessive hunger, excessive urination  Hematologic/lymphatic: No significant bruising, lymphadenopathy,abnormal bleeding Allergy/immunology: No itchy/ watery eyes, significant sneezing, urticaria, angioedema  Physical exam:  Pertinent or positive findings:He has slight pattern alopecia mainly over the crown. There is marked asymmetry of the pupils. The right pupil is distorted, this appears to be postoperative. There is slight exotropia. The left pupil is small.  Breath sounds are decreased. He has minimal rhonchi. Heart sounds are very distant and rhythm is slightly irregular. Abdomen slightly protuberant. Pedal pulses are decreased. He has DIP changes of osteoarthritis with some flexion contractures of the hands. Strength is surprisingly good in the extremities.   he has diffuse keratotic changes over the skin.   General appearance:Adequately nourished; no acute distress , increased work of breathing  is present.   Lymphatic: No lymphadenopathy about the head, neck, axilla . Eyes: No conjunctival inflammation or lid edema is present. There is no scleral icterus. Ears:  External ear exam  shows no significant lesions or deformities.   Nose:  External nasal examination shows no deformity or inflammation. Nasal mucosa are pink and moist without lesions ,exudates Oral exam: lips and gums are healthy appearing.There is no oropharyngeal erythema or exudate . Neck:  No thyromegaly, masses, tenderness noted.    Heart:  Normal rate and regular rhythm. S1 and S2 normal without gallop, murmur, click, rub .  Lungs:Chest clear to auscultation without wheezes, rhonchi,rales , rubs. Abdomen:Bowel sounds are normal. Abdomen is soft and nontender with no organomegaly, hernias,masses. GU: deferred  Extremities:  No cyanosis, clubbing,edema  Neurologic exam : Balance,Rhomberg,finger to nose testing could not be completed due to clinical state Deep tendon reflexes are equal Skin: Warm & dry w/o tenting. No significant rash.  See summary under each active problem in the Problem List with associated updated therapeutic plan

## 2016-08-26 NOTE — Assessment & Plan Note (Signed)
Namenda as not likely to be of benefit and will be discontinued as part of the deprescribing

## 2016-08-26 NOTE — Patient Instructions (Addendum)
See assessment and plan under each diagnosis in the problem list and acutely for this visit To pursue palliative or hospice care, input from his HCPOA is indicated Multivitamins, Flonase, Namenda, pentoxifylline, and tramadol were discontinued. Tylenol was ordered for pain.

## 2016-08-26 NOTE — Assessment & Plan Note (Signed)
The A1c of 9.5% on 3/14 indicates poor diabetic control but adequate in his case. Most critical as to avoid hypoglycemia.

## 2016-08-26 NOTE — Assessment & Plan Note (Signed)
As per Neurology

## 2016-08-27 ENCOUNTER — Telehealth: Payer: Self-pay | Admitting: Neurology

## 2016-08-27 NOTE — Telephone Encounter (Signed)
Caller: PT's son Zachary [name]   Urgent? No  Reason for the call: Son called and said he has medical power of attorney and wanted a phone call from Dr Tat to discuss recent medical issues

## 2016-08-27 NOTE — Telephone Encounter (Signed)
Spoke with son who will be in a meeting until noon. Will call back later.

## 2016-08-27 NOTE — Telephone Encounter (Signed)
Spoke with patient's son. He wanted to know if he had any formal diagnosis of dementia. I did let him know in 2015 he had a 19/30 MOCA. 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 He will call with any other questions.

## 2016-08-31 ENCOUNTER — Non-Acute Institutional Stay (SKILLED_NURSING_FACILITY): Payer: Medicare Other | Admitting: Internal Medicine

## 2016-08-31 ENCOUNTER — Encounter: Payer: Self-pay | Admitting: Internal Medicine

## 2016-08-31 DIAGNOSIS — R05 Cough: Secondary | ICD-10-CM | POA: Diagnosis not present

## 2016-08-31 DIAGNOSIS — R058 Other specified cough: Secondary | ICD-10-CM

## 2016-08-31 NOTE — Progress Notes (Signed)
  This is a nursing facility follow up for specific acute issue of cough.  Interim medical record and care since last [place] visit was updated with review of diagnostic studies and change in clinical status since last visit were documented.  HPI: Staff has noted intermittent nonproductive cough; the patient did have a temp max of 99 on 5/14 but is afebrile at this time. Patient is asymptomatic in reference to upper respiratory tract or lower respiratory tract symptoms; however, he is demented. He gives the date is 10/01/2017. The patient is not on ACE inhibitor. The patient quit smoking in 1964. The history does not list any pulmonary disease.  Son present; Palliative Care referral requested.  Review of systems: Dementia invalidated the following negative responses:.   Constitutional: No fever,significant weight change, fatigue  Eyes: No redness, discharge, pain, vision change ENT/mouth: No nasal congestion,  purulent discharge, earache,change in hearing ,sore throat  Cardiovascular: No chest pain, palpitations,paroxysmal nocturnal dyspnea, claudication, edema  Respiratory: No cough, sputum production,hemoptysis, DOE , significant snoring,apnea  Gastrointestinal: No heartburn,dysphagia,abdominal pain, nausea / vomiting,rectal bleeding, melena,change in bowels Genitourinary: No dysuria,hematuria, pyuria,  incontinence, nocturia Musculoskeletal: No joint stiffness, joint swelling, weakness,pain Dermatologic: No rash, pruritus, change in appearance of skin Neurologic: No dizziness,headache,syncope, seizures, numbness , tingling Psychiatric: No significant anxiety , depression, insomnia, anorexia Endocrine: No change in hair/skin/ nails, excessive thirst, excessive hunger, excessive urination  Hematologic/lymphatic: No significant bruising, lymphadenopathy,abnormal bleeding Allergy/immunology: No itchy/ watery eyes, significant sneezing, urticaria, angioedema  Physical exam:    Pertinent or positive findings: The patient is lethargic, sleeping intermittently. He has some ptosis of the right eye. He tends to keep the right eye closed. Minimally irregular rhythm present.He has minor rhonchi at the bases, overall breath sounds are decreased. This is especially so over the upper lobes. Abdomen is protuberant. Pedal pulses are decreased. Extensive keratoses are noted over the forearms, particularly the right.  General appearance:Adequately nourished; no acute distress , increased work of breathing is present.   Lymphatic: No lymphadenopathy about the head, neck, axilla . Eyes: No conjunctival inflammation or lid edema is present. There is no scleral icterus. Ears:  External ear exam shows no significant lesions or deformities.   Nose:  External nasal examination shows no deformity or inflammation. Nasal mucosa are pink and moist without lesions ,exudates Oral exam: lips and gums are healthy appearing.There is no oropharyngeal erythema or exudate . Neck:  No thyromegaly, masses, tenderness noted.    Heart:  Normal rate . S1 and S2 normal without gallop, murmur, click, rub .  Abdomen:Bowel sounds are normal. Abdomen is soft and nontender with no organomegaly, hernias,masses. GU: deferred  Extremities:  No cyanosis, clubbing,edema  Neurologic exam : Strength equal  in upper & lower extremities Balance,Rhomberg,finger to nose testing could not be completed due to clinical state Skin: Warm & dry w/o tenting. No significant rash.  #1 non productive cough w/o respiratory compromise #2 family request for palliative care consultation Plan: Robitussin DM prn ; imaging if symptoms progressive Referral made to Palliative Care; DNR

## 2016-08-31 NOTE — Patient Instructions (Signed)
See Current Assessment & Plan for acute diagnosis

## 2016-09-01 DIAGNOSIS — R1310 Dysphagia, unspecified: Secondary | ICD-10-CM | POA: Diagnosis not present

## 2016-09-01 DIAGNOSIS — I10 Essential (primary) hypertension: Secondary | ICD-10-CM | POA: Diagnosis not present

## 2016-09-01 DIAGNOSIS — D638 Anemia in other chronic diseases classified elsewhere: Secondary | ICD-10-CM | POA: Diagnosis not present

## 2016-09-01 DIAGNOSIS — I679 Cerebrovascular disease, unspecified: Secondary | ICD-10-CM | POA: Diagnosis not present

## 2016-09-01 DIAGNOSIS — E1159 Type 2 diabetes mellitus with other circulatory complications: Secondary | ICD-10-CM | POA: Diagnosis not present

## 2016-09-01 DIAGNOSIS — K219 Gastro-esophageal reflux disease without esophagitis: Secondary | ICD-10-CM | POA: Diagnosis not present

## 2016-09-01 DIAGNOSIS — F339 Major depressive disorder, recurrent, unspecified: Secondary | ICD-10-CM | POA: Diagnosis not present

## 2016-09-01 DIAGNOSIS — I672 Cerebral atherosclerosis: Secondary | ICD-10-CM | POA: Diagnosis not present

## 2016-09-01 DIAGNOSIS — I69391 Dysphagia following cerebral infarction: Secondary | ICD-10-CM | POA: Diagnosis not present

## 2016-09-01 DIAGNOSIS — I739 Peripheral vascular disease, unspecified: Secondary | ICD-10-CM | POA: Diagnosis not present

## 2016-09-01 DIAGNOSIS — F0151 Vascular dementia with behavioral disturbance: Secondary | ICD-10-CM | POA: Diagnosis not present

## 2016-09-01 DIAGNOSIS — R1312 Dysphagia, oropharyngeal phase: Secondary | ICD-10-CM | POA: Diagnosis not present

## 2016-09-02 DIAGNOSIS — R1312 Dysphagia, oropharyngeal phase: Secondary | ICD-10-CM | POA: Diagnosis not present

## 2016-09-02 DIAGNOSIS — I69391 Dysphagia following cerebral infarction: Secondary | ICD-10-CM | POA: Diagnosis not present

## 2016-09-03 DIAGNOSIS — F0151 Vascular dementia with behavioral disturbance: Secondary | ICD-10-CM | POA: Diagnosis not present

## 2016-09-03 DIAGNOSIS — E1159 Type 2 diabetes mellitus with other circulatory complications: Secondary | ICD-10-CM | POA: Diagnosis not present

## 2016-09-03 DIAGNOSIS — I679 Cerebrovascular disease, unspecified: Secondary | ICD-10-CM | POA: Diagnosis not present

## 2016-09-03 DIAGNOSIS — I672 Cerebral atherosclerosis: Secondary | ICD-10-CM | POA: Diagnosis not present

## 2016-09-03 DIAGNOSIS — R1310 Dysphagia, unspecified: Secondary | ICD-10-CM | POA: Diagnosis not present

## 2016-09-03 DIAGNOSIS — I739 Peripheral vascular disease, unspecified: Secondary | ICD-10-CM | POA: Diagnosis not present

## 2016-09-06 DIAGNOSIS — F0151 Vascular dementia with behavioral disturbance: Secondary | ICD-10-CM | POA: Diagnosis not present

## 2016-09-06 DIAGNOSIS — R1310 Dysphagia, unspecified: Secondary | ICD-10-CM | POA: Diagnosis not present

## 2016-09-06 DIAGNOSIS — I672 Cerebral atherosclerosis: Secondary | ICD-10-CM | POA: Diagnosis not present

## 2016-09-06 DIAGNOSIS — E1159 Type 2 diabetes mellitus with other circulatory complications: Secondary | ICD-10-CM | POA: Diagnosis not present

## 2016-09-06 DIAGNOSIS — I679 Cerebrovascular disease, unspecified: Secondary | ICD-10-CM | POA: Diagnosis not present

## 2016-09-06 DIAGNOSIS — I739 Peripheral vascular disease, unspecified: Secondary | ICD-10-CM | POA: Diagnosis not present

## 2016-09-07 DIAGNOSIS — I672 Cerebral atherosclerosis: Secondary | ICD-10-CM | POA: Diagnosis not present

## 2016-09-07 DIAGNOSIS — I739 Peripheral vascular disease, unspecified: Secondary | ICD-10-CM | POA: Diagnosis not present

## 2016-09-07 DIAGNOSIS — F0151 Vascular dementia with behavioral disturbance: Secondary | ICD-10-CM | POA: Diagnosis not present

## 2016-09-07 DIAGNOSIS — I679 Cerebrovascular disease, unspecified: Secondary | ICD-10-CM | POA: Diagnosis not present

## 2016-09-07 DIAGNOSIS — R1310 Dysphagia, unspecified: Secondary | ICD-10-CM | POA: Diagnosis not present

## 2016-09-07 DIAGNOSIS — E1159 Type 2 diabetes mellitus with other circulatory complications: Secondary | ICD-10-CM | POA: Diagnosis not present

## 2016-09-08 ENCOUNTER — Encounter: Payer: Self-pay | Admitting: Nurse Practitioner

## 2016-09-08 ENCOUNTER — Non-Acute Institutional Stay (SKILLED_NURSING_FACILITY): Payer: Medicare Other | Admitting: Nurse Practitioner

## 2016-09-08 DIAGNOSIS — R1310 Dysphagia, unspecified: Secondary | ICD-10-CM | POA: Diagnosis not present

## 2016-09-08 DIAGNOSIS — I679 Cerebrovascular disease, unspecified: Secondary | ICD-10-CM | POA: Diagnosis not present

## 2016-09-08 DIAGNOSIS — E1159 Type 2 diabetes mellitus with other circulatory complications: Secondary | ICD-10-CM | POA: Diagnosis not present

## 2016-09-08 DIAGNOSIS — H539 Unspecified visual disturbance: Secondary | ICD-10-CM

## 2016-09-08 DIAGNOSIS — K219 Gastro-esophageal reflux disease without esophagitis: Secondary | ICD-10-CM | POA: Diagnosis not present

## 2016-09-08 DIAGNOSIS — F0150 Vascular dementia without behavioral disturbance: Secondary | ICD-10-CM

## 2016-09-08 DIAGNOSIS — I739 Peripheral vascular disease, unspecified: Secondary | ICD-10-CM | POA: Diagnosis not present

## 2016-09-08 DIAGNOSIS — I672 Cerebral atherosclerosis: Secondary | ICD-10-CM | POA: Diagnosis not present

## 2016-09-08 DIAGNOSIS — F0151 Vascular dementia with behavioral disturbance: Secondary | ICD-10-CM | POA: Diagnosis not present

## 2016-09-08 NOTE — Progress Notes (Signed)
Nursing Home Location: [facility] Room: 320 A   Place of Service: SNF (31)  PCP: Shamleffer, Herschell Dimes, MD  Allergies  Allergen Reactions  . Penicillins Rash    Has patient had a PCN reaction causing immediate rash, facial/tongue/throat swelling, SOB or lightheadedness with hypotension: No Has patient had a PCN reaction causing severe rash involving mucus membranes or skin necrosis: No Has patient had a PCN reaction that required hospitalization No Has patient had a PCN reaction occurring within the last 10 years: No If all of the above answers are "NO", then may proceed with Cephalosporin use.    Chief Complaint  Patient presents with  . Acute Visit    Resident is being seen due to family concerns.     HPI:  Patient is a 83 y.o. male with a  hx of left ICA stenosis and left brain stroke s/p left carotid endarterectomy, DM, dementia, anxiety/depression, hyperlipidemia. pts son-in-law who is POA acted me to call him due to multiple questions about pts history and care.  Son-in-law is concerned about pts frequent falls and visual impairment. Has glasses but not wearing them.  Son-in-law also wanted to discuss dementia diagnosis and care around this.  Due to progressive memory loss and decline in health pt has been transitioned to hospice. Medication has been minimized to reduce pill burden.  There is no acute concerns with staff. Pt resting in room. Reports his back hurts at times and he has to lay down to help it. Due to dementia ROS and HPI limited.   Review of Systems:  Review of Systems  Unable to perform ROS: Dementia    Past Medical History:  Diagnosis Date  . Arthritis   . Carotid artery occlusion   . GERD (gastroesophageal reflux disease)   . Headache    "sometimes monthly" (06/29/2016)  . Heart block   . History of blood transfusion    "while in the service"  . History of stomach ulcers   . Hypertension   . Insomnia   .  Pancreatitis   . Prostate cancer ([place])    Archie Endo 09/02/2010  . Prostate enlargement   . PVC's (premature ventricular contractions)   . PVD (peripheral vascular disease) ([place])   . Rhabdomyolysis 06/2016  . Stroke ([place]) 05/2013; 06/29/2016   Archie Endo 05/24/2013  . Type II diabetes mellitus ([place]) dx'd 2008   Archie Endo 08/19/2010   Past Surgical History:  Procedure Laterality Date  . CATARACT EXTRACTION W/ INTRAOCULAR LENS  IMPLANT, BILATERAL Bilateral   . CHOLECYSTECTOMY  07/18/2011   Procedure: LAPAROSCOPIC CHOLECYSTECTOMY;  Surgeon: Gwenyth Ober, MD;  Location: [place];  Service: General;  Laterality: N/A;  . ENDARTERECTOMY Right 05/28/2013   Procedure: ENDARTERECTOMY CAROTID;  Surgeon: Rosetta Posner, MD;  Location: [place];  Service: Vascular;  Laterality: Right;  . ENDARTERECTOMY Left 07/09/2016   Procedure: ENDARTERECTOMY CAROTID;  Surgeon: Rosetta Posner, MD;  Location: [place];  Service: Vascular;  Laterality: Left;  . ESOPHAGOGASTRODUODENOSCOPY (EGD) WITH ESOPHAGEAL DILATION  2004; 12/2009   Archie Endo 01/08/2010  . FLEXIBLE SIGMOIDOSCOPY N/A 09/23/2014   Procedure: FLEXIBLE SIGMOIDOSCOPY;  Surgeon: Garlan Fair, MD;  Location: [facility];  Service: Endoscopy;  Laterality: N/A;  . INSERTION PROSTATE RADIATION SEED  03/1999   Archie Endo 09/02/2010  . PATCH ANGIOPLASTY Left 07/09/2016   Procedure: PATCH ANGIOPLASTY LEFT CAROTID ARTERY;  Surgeon: Rosetta Posner, MD;  Location: [place];  Service: Vascular;  Laterality: Left;  . PROSTATE BIOPSY  2000   Archie Endo 09/02/2010  . TONSILLECTOMY     Social History:   reports that he quit smoking about 54 years ago. He has a 60.00 pack-year smoking history. He has never used smokeless tobacco. He reports that he does not drink alcohol or use drugs.  Family History  Problem Relation Age of Onset  . Breast cancer Daughter   . Cancer Daughter 31       died of cancer   . Stroke Father   . Other Unknown        hypogonadism  . Heart attack Neg Hx   . Hypertension Neg Hx      Medications: Patient's Medications  New Prescriptions   No medications on file  Previous Medications   ACETAMINOPHEN (TYLENOL) 325 MG TABLET    Take 650 mg by mouth every 6 (six) hours as needed for mild pain.   ASPIRIN 325 MG TABLET    Take 1 tablet (325 mg total) by mouth daily.   BISACODYL (DULCOLAX) 10 MG SUPPOSITORY    Place 10 mg rectally daily as needed for moderate constipation (for constipation not relieved by milk of magnesium).   CLOPIDOGREL (PLAVIX) 75 MG TABLET    Take 1 tablet (75 mg total) by mouth daily.   DIVALPROEX (DEPAKOTE SPRINKLE) 125 MG CAPSULE    Take 125 mg by mouth 3 (three) times daily with meals.   GLIMEPIRIDE (AMARYL) 2 MG TABLET    Take 2 mg by mouth daily with breakfast.   MAGNESIUM HYDROXIDE (MILK OF MAGNESIA) 400 MG/5ML SUSPENSION    Take 30 mLs by mouth daily as needed (for constipation).   MELATONIN 3 MG TABS    Take 1 tablet by mouth at bedtime as needed (sleep).    OMEPRAZOLE (PRILOSEC) 20 MG CAPSULE    Take 20 mg by mouth daily at 6 (six) AM.    SERTRALINE (ZOLOFT) 50 MG TABLET    Take 50 mg by mouth daily.   TAMSULOSIN HCL (FLOMAX) 0.4 MG CAPS    Take 0.4 mg by mouth daily at 6 PM.   Modified Medications   No medications on file  Discontinued Medications   No medications on file     Physical Exam: Vitals:   09/08/16 1334  BP: 116/64  Pulse: 65  Resp: 20  Temp: 97.4 F (36.3 C)  SpO2: 98%  Weight: 193 lb 12.8 oz (87.9 kg)  Height: 5\' 10"  (1.778 m)    Physical Exam  Constitutional: He appears well-developed and well-nourished. No distress.  HENT:  Head: Normocephalic and atraumatic.  Mouth/Throat: Oropharynx is clear and moist. No oropharyngeal exudate.  Eyes: Conjunctivae are normal.  The right pupil is irregular. The left pupil is pinpoint.  Neck: Normal range of motion. Neck supple.  Cardiovascular: Normal rate and normal heart sounds.  An irregular rhythm present.  Pulmonary/Chest: Effort normal and breath sounds normal.   Abdominal: Soft. Bowel sounds are normal.  Musculoskeletal: He exhibits no edema or tenderness.  Neurological: He is alert.  Skin: Skin is warm and dry. He is not diaphoretic.  Psychiatric: He has a normal mood and affect.    Labs reviewed: Basic Metabolic Panel:  Recent Labs  06/29/16 1413  07/02/16 0852 07/04/16 1546 07/09/16 0632  07/10/16 0306 07/12/16 07/28/16  NA  --   < > 141 142 140  < > 140 142 142  K  --   < > 3.7 3.7 3.9  < > 4.0 4.4 3.9  CL  --   < >  104 105 103  --  105  --   --   CO2  --   < > 26  --  27  --  29  --   --   GLUCOSE  --   < > 108* 100* 101*  --  141*  --   --   BUN  --   < > 6 17 13   --  9 14 29*  CREATININE  --   < > 0.88 1.10 1.13  --  0.94 0.9 0.9  CALCIUM  --   < > 8.8*  --  8.9  --  8.4*  --   --   MG 1.8  --   --   --   --   --   --   --   --   < > = values in this interval not displayed. Liver Function Tests:  Recent Labs  06/29/16 1132 06/30/16 0608  AST 108* 94*  ALT 30 31  ALKPHOS 60 50  BILITOT 1.5* 1.4*  PROT 6.8 5.8*  ALBUMIN 3.7 3.0*   No results for input(s): LIPASE, AMYLASE in the last 8760 hours. No results for input(s): AMMONIA in the last 8760 hours. CBC:  Recent Labs  06/29/16 1132  06/30/16 0608  07/09/16 0632  07/09/16 1201 07/10/16 0306 07/12/16  WBC 22.0*  --  14.9*  < > 12.8*  --  17.7* 12.7* 16.6  NEUTROABS 16.5*  --  9.5*  --   --   --   --   --   --   HGB 16.3  < > 14.0  < > 14.0  < > 9.7* 9.6* 10.2*  HCT 46.4  < > 40.5  < > 42.0  < > 28.8* 29.1* 30*  MCV 92.1  --  91.4  < > 93.8  --  93.2 94.8  --   PLT 277  --  219  < > 293  --  226 233 267  < > = values in this interval not displayed. TSH: No results for input(s): TSH in the last 8760 hours. A1C: Lab Results  Component Value Date   HGBA1C 9.5 (H) 06/30/2016   Lipid Panel:  Recent Labs  06/30/16 0608  CHOL 147  HDL 25*  LDLCALC 99  TRIG 113  CHOLHDL 5.9    Radiological Exams: Ct Head Wo Contrast  Result Date:  07/04/2016 CLINICAL DATA:  Right-sided weakness and right facial droop. EXAM: CT HEAD WITHOUT CONTRAST TECHNIQUE: Contiguous axial images were obtained from the base of the skull through the vertex without intravenous contrast. COMPARISON:  06/29/2016 and 08/04/2015, MRI brain 06/29/2016 FINDINGS: Brain: Ventricles and cisterns are within normal. CSF spaces are within normal. Evidence of chronic ischemic microvascular disease. Small old right posterior parietal/ occipital infarct. No mass, mass effect, shift of midline structures or acute hemorrhage. The small foci of acute ischemia seen on previous MRI are not well seen by CT. Vascular: Calcified plaque over the cavernous segment of the internal carotid arteries and left vertebral artery. Skull: Within normal. Sinuses/Orbits: Within normal. Other: None. IMPRESSION: No acute intracranial findings. Previously seen small foci of acute ischemic change over the left convexity by MRI are not apparent by CT. Chronic ischemic microvascular disease. Small old right posterior parietal/ occipital infarct. Electronically Signed   By: Marin Olp M.D.   On: 07/04/2016 16:08   Assessment/Plan 1. 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 Pt with hx of dementia in epic  from 2015 however unsure of exact date dementia was diagnosised. When pt was at [facility] in 2015 SLUMS score of 21/30 which is mild memory loss and anything <20 indicates dementia. He was only oriented to himself. He was advised at that time to discharge home with 24/7. During this admission SLUMS score of 8/30 indicating dementia.  He was placed on Aricept and Namanda in 2015 but question compliance with medications. These have been stopped to reduce pill burden and were not providing benefit due to significant decline in disease state.  -discussed in detail about diease and progression with son-in-law.   2. Visual disturbance -son-in-law concerned over poor vision.  Staff notified and to see if caregivers can bring glasses in. Will also consult ophthalmology for evaluation.   3. GERD Son-in-law would like to dc omeprazole and treat GERD as needed which at this time is appropriate.   Total time 40 min,  time greater than 50% of total time spent doing counseling and coordination of care regarding disease process with son-in-law and discussion with staff.  Was on the phone with son-in-law for 35 mins.   Carlos American. Harle Battiest  [facility] [phone] 8 am - 5 pm) [phone] (after hours)

## 2016-09-10 DIAGNOSIS — F0151 Vascular dementia with behavioral disturbance: Secondary | ICD-10-CM | POA: Diagnosis not present

## 2016-09-10 DIAGNOSIS — E1159 Type 2 diabetes mellitus with other circulatory complications: Secondary | ICD-10-CM | POA: Diagnosis not present

## 2016-09-10 DIAGNOSIS — I739 Peripheral vascular disease, unspecified: Secondary | ICD-10-CM | POA: Diagnosis not present

## 2016-09-10 DIAGNOSIS — I672 Cerebral atherosclerosis: Secondary | ICD-10-CM | POA: Diagnosis not present

## 2016-09-10 DIAGNOSIS — I679 Cerebrovascular disease, unspecified: Secondary | ICD-10-CM | POA: Diagnosis not present

## 2016-09-10 DIAGNOSIS — R1310 Dysphagia, unspecified: Secondary | ICD-10-CM | POA: Diagnosis not present

## 2016-09-12 DIAGNOSIS — I672 Cerebral atherosclerosis: Secondary | ICD-10-CM | POA: Diagnosis not present

## 2016-09-12 DIAGNOSIS — F0151 Vascular dementia with behavioral disturbance: Secondary | ICD-10-CM | POA: Diagnosis not present

## 2016-09-12 DIAGNOSIS — R1310 Dysphagia, unspecified: Secondary | ICD-10-CM | POA: Diagnosis not present

## 2016-09-12 DIAGNOSIS — I739 Peripheral vascular disease, unspecified: Secondary | ICD-10-CM | POA: Diagnosis not present

## 2016-09-12 DIAGNOSIS — E1159 Type 2 diabetes mellitus with other circulatory complications: Secondary | ICD-10-CM | POA: Diagnosis not present

## 2016-09-12 DIAGNOSIS — I679 Cerebrovascular disease, unspecified: Secondary | ICD-10-CM | POA: Diagnosis not present

## 2016-09-15 DIAGNOSIS — F0151 Vascular dementia with behavioral disturbance: Secondary | ICD-10-CM | POA: Diagnosis not present

## 2016-09-15 DIAGNOSIS — I739 Peripheral vascular disease, unspecified: Secondary | ICD-10-CM | POA: Diagnosis not present

## 2016-09-15 DIAGNOSIS — I679 Cerebrovascular disease, unspecified: Secondary | ICD-10-CM | POA: Diagnosis not present

## 2016-09-15 DIAGNOSIS — I672 Cerebral atherosclerosis: Secondary | ICD-10-CM | POA: Diagnosis not present

## 2016-09-15 DIAGNOSIS — R1310 Dysphagia, unspecified: Secondary | ICD-10-CM | POA: Diagnosis not present

## 2016-09-15 DIAGNOSIS — E1159 Type 2 diabetes mellitus with other circulatory complications: Secondary | ICD-10-CM | POA: Diagnosis not present

## 2016-09-16 DIAGNOSIS — I679 Cerebrovascular disease, unspecified: Secondary | ICD-10-CM | POA: Diagnosis not present

## 2016-09-16 DIAGNOSIS — E1159 Type 2 diabetes mellitus with other circulatory complications: Secondary | ICD-10-CM | POA: Diagnosis not present

## 2016-09-16 DIAGNOSIS — I739 Peripheral vascular disease, unspecified: Secondary | ICD-10-CM | POA: Diagnosis not present

## 2016-09-16 DIAGNOSIS — I672 Cerebral atherosclerosis: Secondary | ICD-10-CM | POA: Diagnosis not present

## 2016-09-16 DIAGNOSIS — R1310 Dysphagia, unspecified: Secondary | ICD-10-CM | POA: Diagnosis not present

## 2016-09-16 DIAGNOSIS — F0151 Vascular dementia with behavioral disturbance: Secondary | ICD-10-CM | POA: Diagnosis not present

## 2016-09-17 DIAGNOSIS — K219 Gastro-esophageal reflux disease without esophagitis: Secondary | ICD-10-CM | POA: Diagnosis not present

## 2016-09-17 DIAGNOSIS — G20 Parkinson's disease: Secondary | ICD-10-CM | POA: Diagnosis not present

## 2016-09-17 DIAGNOSIS — I10 Essential (primary) hypertension: Secondary | ICD-10-CM | POA: Diagnosis not present

## 2016-09-17 DIAGNOSIS — F0151 Vascular dementia with behavioral disturbance: Secondary | ICD-10-CM | POA: Diagnosis not present

## 2016-09-17 DIAGNOSIS — F339 Major depressive disorder, recurrent, unspecified: Secondary | ICD-10-CM | POA: Diagnosis not present

## 2016-09-17 DIAGNOSIS — D638 Anemia in other chronic diseases classified elsewhere: Secondary | ICD-10-CM | POA: Diagnosis not present

## 2016-09-17 DIAGNOSIS — I679 Cerebrovascular disease, unspecified: Secondary | ICD-10-CM | POA: Diagnosis not present

## 2016-09-17 DIAGNOSIS — I739 Peripheral vascular disease, unspecified: Secondary | ICD-10-CM | POA: Diagnosis not present

## 2016-09-17 DIAGNOSIS — R1310 Dysphagia, unspecified: Secondary | ICD-10-CM | POA: Diagnosis not present

## 2016-09-17 DIAGNOSIS — I672 Cerebral atherosclerosis: Secondary | ICD-10-CM | POA: Diagnosis not present

## 2016-09-17 DIAGNOSIS — E1159 Type 2 diabetes mellitus with other circulatory complications: Secondary | ICD-10-CM | POA: Diagnosis not present

## 2016-09-20 DIAGNOSIS — I672 Cerebral atherosclerosis: Secondary | ICD-10-CM | POA: Diagnosis not present

## 2016-09-20 DIAGNOSIS — F0151 Vascular dementia with behavioral disturbance: Secondary | ICD-10-CM | POA: Diagnosis not present

## 2016-09-20 DIAGNOSIS — I739 Peripheral vascular disease, unspecified: Secondary | ICD-10-CM | POA: Diagnosis not present

## 2016-09-20 DIAGNOSIS — I679 Cerebrovascular disease, unspecified: Secondary | ICD-10-CM | POA: Diagnosis not present

## 2016-09-20 DIAGNOSIS — E1159 Type 2 diabetes mellitus with other circulatory complications: Secondary | ICD-10-CM | POA: Diagnosis not present

## 2016-09-20 DIAGNOSIS — R1310 Dysphagia, unspecified: Secondary | ICD-10-CM | POA: Diagnosis not present

## 2016-09-20 NOTE — Addendum Note (Signed)
Addendum  created 09/20/16 1236 by Eleshia Wooley, MD   Sign clinical note    

## 2016-09-20 NOTE — Anesthesia Postprocedure Evaluation (Signed)
Anesthesia Post Note  Patient: Zachary [name]  Procedure(s) Performed: Procedure(s) (LRB): ENDARTERECTOMY CAROTID (Left) PATCH ANGIOPLASTY LEFT CAROTID ARTERY (Left)     Anesthesia Post Evaluation  Last Vitals:  Vitals:   07/12/16 0444 07/12/16 1149  BP: (!) 165/73   Pulse: 78 (!) 102  Resp: 18 18  Temp: 36.7 C 37 C    Last Pain:  Vitals:   07/12/16 1149  TempSrc: Oral  PainSc:                  Derron Pipkins S

## 2016-09-22 DIAGNOSIS — I672 Cerebral atherosclerosis: Secondary | ICD-10-CM | POA: Diagnosis not present

## 2016-09-22 DIAGNOSIS — F0151 Vascular dementia with behavioral disturbance: Secondary | ICD-10-CM | POA: Diagnosis not present

## 2016-09-22 DIAGNOSIS — R1310 Dysphagia, unspecified: Secondary | ICD-10-CM | POA: Diagnosis not present

## 2016-09-22 DIAGNOSIS — E1159 Type 2 diabetes mellitus with other circulatory complications: Secondary | ICD-10-CM | POA: Diagnosis not present

## 2016-09-22 DIAGNOSIS — I739 Peripheral vascular disease, unspecified: Secondary | ICD-10-CM | POA: Diagnosis not present

## 2016-09-22 DIAGNOSIS — I679 Cerebrovascular disease, unspecified: Secondary | ICD-10-CM | POA: Diagnosis not present

## 2016-09-23 DIAGNOSIS — I679 Cerebrovascular disease, unspecified: Secondary | ICD-10-CM | POA: Diagnosis not present

## 2016-09-23 DIAGNOSIS — F0151 Vascular dementia with behavioral disturbance: Secondary | ICD-10-CM | POA: Diagnosis not present

## 2016-09-23 DIAGNOSIS — E1159 Type 2 diabetes mellitus with other circulatory complications: Secondary | ICD-10-CM | POA: Diagnosis not present

## 2016-09-23 DIAGNOSIS — R1310 Dysphagia, unspecified: Secondary | ICD-10-CM | POA: Diagnosis not present

## 2016-09-23 DIAGNOSIS — I739 Peripheral vascular disease, unspecified: Secondary | ICD-10-CM | POA: Diagnosis not present

## 2016-09-23 DIAGNOSIS — I672 Cerebral atherosclerosis: Secondary | ICD-10-CM | POA: Diagnosis not present

## 2016-09-24 DIAGNOSIS — I672 Cerebral atherosclerosis: Secondary | ICD-10-CM | POA: Diagnosis not present

## 2016-09-24 DIAGNOSIS — E1159 Type 2 diabetes mellitus with other circulatory complications: Secondary | ICD-10-CM | POA: Diagnosis not present

## 2016-09-24 DIAGNOSIS — I739 Peripheral vascular disease, unspecified: Secondary | ICD-10-CM | POA: Diagnosis not present

## 2016-09-24 DIAGNOSIS — R1310 Dysphagia, unspecified: Secondary | ICD-10-CM | POA: Diagnosis not present

## 2016-09-24 DIAGNOSIS — F0151 Vascular dementia with behavioral disturbance: Secondary | ICD-10-CM | POA: Diagnosis not present

## 2016-09-24 DIAGNOSIS — I679 Cerebrovascular disease, unspecified: Secondary | ICD-10-CM | POA: Diagnosis not present

## 2016-09-27 DIAGNOSIS — F0151 Vascular dementia with behavioral disturbance: Secondary | ICD-10-CM | POA: Diagnosis not present

## 2016-09-27 DIAGNOSIS — I679 Cerebrovascular disease, unspecified: Secondary | ICD-10-CM | POA: Diagnosis not present

## 2016-09-27 DIAGNOSIS — I739 Peripheral vascular disease, unspecified: Secondary | ICD-10-CM | POA: Diagnosis not present

## 2016-09-27 DIAGNOSIS — E1159 Type 2 diabetes mellitus with other circulatory complications: Secondary | ICD-10-CM | POA: Diagnosis not present

## 2016-09-27 DIAGNOSIS — I672 Cerebral atherosclerosis: Secondary | ICD-10-CM | POA: Diagnosis not present

## 2016-09-27 DIAGNOSIS — R1310 Dysphagia, unspecified: Secondary | ICD-10-CM | POA: Diagnosis not present

## 2016-09-29 DIAGNOSIS — I739 Peripheral vascular disease, unspecified: Secondary | ICD-10-CM | POA: Diagnosis not present

## 2016-09-29 DIAGNOSIS — F0151 Vascular dementia with behavioral disturbance: Secondary | ICD-10-CM | POA: Diagnosis not present

## 2016-09-29 DIAGNOSIS — E1159 Type 2 diabetes mellitus with other circulatory complications: Secondary | ICD-10-CM | POA: Diagnosis not present

## 2016-09-29 DIAGNOSIS — R1310 Dysphagia, unspecified: Secondary | ICD-10-CM | POA: Diagnosis not present

## 2016-09-29 DIAGNOSIS — I679 Cerebrovascular disease, unspecified: Secondary | ICD-10-CM | POA: Diagnosis not present

## 2016-09-29 DIAGNOSIS — I672 Cerebral atherosclerosis: Secondary | ICD-10-CM | POA: Diagnosis not present

## 2016-09-30 ENCOUNTER — Encounter: Payer: Self-pay | Admitting: Internal Medicine

## 2016-09-30 ENCOUNTER — Non-Acute Institutional Stay (SKILLED_NURSING_FACILITY): Payer: Medicare Other | Admitting: Internal Medicine

## 2016-09-30 DIAGNOSIS — F0150 Vascular dementia without behavioral disturbance: Secondary | ICD-10-CM

## 2016-09-30 DIAGNOSIS — I639 Cerebral infarction, unspecified: Secondary | ICD-10-CM

## 2016-09-30 NOTE — Patient Instructions (Addendum)
See assessment and plan under each diagnosis in the problem list and acutely for this visit Total time 42  minutes; greater than 50% of the visit spent counseling HCPOA about health status & prognosis of patient and coordinating care for problems addressed at this encounter

## 2016-09-30 NOTE — Progress Notes (Signed)
NURSING HOME LOCATION:  [facility] ROOM NUMBER:  [id number]  CODE STATUS:  DNR  PCP:  Lottie Dawson, MD [facility]  [address] [place] [address]  This is a nursing facility follow up of chronic medical diagnoses & conference with Zachary [name].  Interim medical record and care since last [place] visit was updated with review of diagnostic studies and change in clinical status since last visit were documented.  HPI: Hospice is now collaborating on the patient's care as he has been designated DO NOT RESUSCITATE. It's become apparent that that form was signed by a daughter who is not his healthcare power of attorney. The HCPOA has come in from [place], [place] today to discuss the situation. In collaboration with hospice his medications have been adjusted and nonessential medicines discontinued. The patient is asymptomatic at this time except for intermittent nonproductive cough. A detailed review of systems is answered "no". Reliability is in question as the patient is markedly demented. Unfortunately he has not been able to comprehend safety precautions were submitted explained to him multiple times. He attempts to go the bathroom by himself and has fallen at the SNF.  He gave the date as 10/06/2012. He could not name the president.  Review of systems: Constitutional: No fever,significant weight change, fatigue  Eyes: No redness, discharge, pain, vision change ENT/mouth: No nasal congestion,  purulent discharge, earache,change in hearing ,sore throat  Cardiovascular: No chest pain, palpitations,paroxysmal nocturnal dyspnea, claudication, edema  Respiratory: No sputum production,hemoptysis, DOE , significant snoring,apnea  Gastrointestinal: No heartburn,dysphagia,abdominal pain, nausea / vomiting,rectal bleeding, melena,change in bowels Genitourinary: No dysuria,hematuria, pyuria,  incontinence, nocturia Musculoskeletal: No joint stiffness, joint  swelling, weakness,pain Dermatologic: No rash, pruritus, change in appearance of skin Neurologic: No dizziness,headache,syncope, seizures, numbness , tingling Psychiatric: No significant anxiety , depression, insomnia, anorexia Endocrine: No change in hair/skin/ nails, excessive thirst, excessive hunger, excessive urination  Hematologic/lymphatic: No significant bruising, lymphadenopathy,abnormal bleeding Allergy/immunology: No itchy/ watery eyes, significant sneezing, urticaria, angioedema  Physical exam:  Pertinent or positive findings: The patient is neatly groomed. He is in a wheelchair. Exotropia is present on the right. There is slight asymmetry of the nasolabial folds. His lower teeth are ground down. He has minor rales at the bases. Heart sounds are distant. The second heart sound is slightly increased. Abdomen is protuberant. Pedal pulses are decreased. He is generally weak in all extremities without definite asymmetry. He has osteoarthritic changes of the hands. Keratotic lesions are present over the forearms.  General appearance:Adequately nourished; no acute distress , increased work of breathing is present.   Lymphatic: No lymphadenopathy about the head, neck, axilla . Eyes: No conjunctival inflammation or lid edema is present. There is no scleral icterus. Ears:  External ear exam shows no significant lesions or deformities.   Nose:  External nasal examination shows no deformity or inflammation. Nasal mucosa are pink and moist without lesions ,exudates Oral exam: lips and gums are healthy appearing.There is no oropharyngeal erythema or exudate . Neck:  No thyromegaly, masses, tenderness noted.    Heart:  Normal rate and regular rhythm. S1 normal without gallop, murmur, click, rub .  Lungs: without wheezes, rhonchi, rubs. Abdomen:Bowel sounds are normal. Abdomen is soft and nontender with no organomegaly, hernias,masses. GU: deferred  Extremities:  No cyanosis, clubbing,edema    Neurologic exam : Strength equal  in upper & lower extremities Balance,Rhomberg,finger to nose testing could not be completed due to clinical state Skin: Warm & dry w/o tenting.  No significant lesions or rash.  See summary under each active problem in the Problem List with associated updated therapeutic plan

## 2016-09-30 NOTE — Assessment & Plan Note (Addendum)
Hospice requested discontinuation of the Plavix because of recurrent falls This is reasonable in the context of his advanced dementia and DO NOT RESUSCITATE status. ASA continued. This was discussed with Rona Ravens [place] ,[place]; [phone]) the healthcare power of attorney 09/30/16 and son-in-law of Mr. Yahye Siebert for Mr. Collier Bullock: Britt Bottom, Sharmaine Base

## 2016-09-30 NOTE — Assessment & Plan Note (Signed)
Due to lack of mental capacity, all healthcare matters will be discussed with the healthcare power of attorney as noted

## 2016-10-01 DIAGNOSIS — I679 Cerebrovascular disease, unspecified: Secondary | ICD-10-CM | POA: Diagnosis not present

## 2016-10-01 DIAGNOSIS — R1310 Dysphagia, unspecified: Secondary | ICD-10-CM | POA: Diagnosis not present

## 2016-10-01 DIAGNOSIS — I739 Peripheral vascular disease, unspecified: Secondary | ICD-10-CM | POA: Diagnosis not present

## 2016-10-01 DIAGNOSIS — F0151 Vascular dementia with behavioral disturbance: Secondary | ICD-10-CM | POA: Diagnosis not present

## 2016-10-01 DIAGNOSIS — E1159 Type 2 diabetes mellitus with other circulatory complications: Secondary | ICD-10-CM | POA: Diagnosis not present

## 2016-10-01 DIAGNOSIS — I672 Cerebral atherosclerosis: Secondary | ICD-10-CM | POA: Diagnosis not present

## 2016-10-04 DIAGNOSIS — I679 Cerebrovascular disease, unspecified: Secondary | ICD-10-CM | POA: Diagnosis not present

## 2016-10-04 DIAGNOSIS — R1310 Dysphagia, unspecified: Secondary | ICD-10-CM | POA: Diagnosis not present

## 2016-10-04 DIAGNOSIS — I672 Cerebral atherosclerosis: Secondary | ICD-10-CM | POA: Diagnosis not present

## 2016-10-04 DIAGNOSIS — E1159 Type 2 diabetes mellitus with other circulatory complications: Secondary | ICD-10-CM | POA: Diagnosis not present

## 2016-10-04 DIAGNOSIS — I739 Peripheral vascular disease, unspecified: Secondary | ICD-10-CM | POA: Diagnosis not present

## 2016-10-04 DIAGNOSIS — F0151 Vascular dementia with behavioral disturbance: Secondary | ICD-10-CM | POA: Diagnosis not present

## 2016-10-05 DIAGNOSIS — I672 Cerebral atherosclerosis: Secondary | ICD-10-CM | POA: Diagnosis not present

## 2016-10-05 DIAGNOSIS — I679 Cerebrovascular disease, unspecified: Secondary | ICD-10-CM | POA: Diagnosis not present

## 2016-10-05 DIAGNOSIS — F0151 Vascular dementia with behavioral disturbance: Secondary | ICD-10-CM | POA: Diagnosis not present

## 2016-10-05 DIAGNOSIS — E1159 Type 2 diabetes mellitus with other circulatory complications: Secondary | ICD-10-CM | POA: Diagnosis not present

## 2016-10-05 DIAGNOSIS — I739 Peripheral vascular disease, unspecified: Secondary | ICD-10-CM | POA: Diagnosis not present

## 2016-10-05 DIAGNOSIS — R1310 Dysphagia, unspecified: Secondary | ICD-10-CM | POA: Diagnosis not present

## 2016-10-06 DIAGNOSIS — I679 Cerebrovascular disease, unspecified: Secondary | ICD-10-CM | POA: Diagnosis not present

## 2016-10-06 DIAGNOSIS — E1159 Type 2 diabetes mellitus with other circulatory complications: Secondary | ICD-10-CM | POA: Diagnosis not present

## 2016-10-06 DIAGNOSIS — I672 Cerebral atherosclerosis: Secondary | ICD-10-CM | POA: Diagnosis not present

## 2016-10-06 DIAGNOSIS — I739 Peripheral vascular disease, unspecified: Secondary | ICD-10-CM | POA: Diagnosis not present

## 2016-10-06 DIAGNOSIS — F0151 Vascular dementia with behavioral disturbance: Secondary | ICD-10-CM | POA: Diagnosis not present

## 2016-10-06 DIAGNOSIS — R1310 Dysphagia, unspecified: Secondary | ICD-10-CM | POA: Diagnosis not present

## 2016-10-07 DIAGNOSIS — I679 Cerebrovascular disease, unspecified: Secondary | ICD-10-CM | POA: Diagnosis not present

## 2016-10-07 DIAGNOSIS — E1159 Type 2 diabetes mellitus with other circulatory complications: Secondary | ICD-10-CM | POA: Diagnosis not present

## 2016-10-07 DIAGNOSIS — R1310 Dysphagia, unspecified: Secondary | ICD-10-CM | POA: Diagnosis not present

## 2016-10-07 DIAGNOSIS — I739 Peripheral vascular disease, unspecified: Secondary | ICD-10-CM | POA: Diagnosis not present

## 2016-10-07 DIAGNOSIS — F0151 Vascular dementia with behavioral disturbance: Secondary | ICD-10-CM | POA: Diagnosis not present

## 2016-10-07 DIAGNOSIS — I672 Cerebral atherosclerosis: Secondary | ICD-10-CM | POA: Diagnosis not present

## 2016-10-11 DIAGNOSIS — F0151 Vascular dementia with behavioral disturbance: Secondary | ICD-10-CM | POA: Diagnosis not present

## 2016-10-11 DIAGNOSIS — I679 Cerebrovascular disease, unspecified: Secondary | ICD-10-CM | POA: Diagnosis not present

## 2016-10-11 DIAGNOSIS — R1310 Dysphagia, unspecified: Secondary | ICD-10-CM | POA: Diagnosis not present

## 2016-10-11 DIAGNOSIS — E1159 Type 2 diabetes mellitus with other circulatory complications: Secondary | ICD-10-CM | POA: Diagnosis not present

## 2016-10-11 DIAGNOSIS — I739 Peripheral vascular disease, unspecified: Secondary | ICD-10-CM | POA: Diagnosis not present

## 2016-10-11 DIAGNOSIS — I672 Cerebral atherosclerosis: Secondary | ICD-10-CM | POA: Diagnosis not present

## 2016-10-13 DIAGNOSIS — I739 Peripheral vascular disease, unspecified: Secondary | ICD-10-CM | POA: Diagnosis not present

## 2016-10-13 DIAGNOSIS — F0151 Vascular dementia with behavioral disturbance: Secondary | ICD-10-CM | POA: Diagnosis not present

## 2016-10-13 DIAGNOSIS — E1159 Type 2 diabetes mellitus with other circulatory complications: Secondary | ICD-10-CM | POA: Diagnosis not present

## 2016-10-13 DIAGNOSIS — I679 Cerebrovascular disease, unspecified: Secondary | ICD-10-CM | POA: Diagnosis not present

## 2016-10-13 DIAGNOSIS — I672 Cerebral atherosclerosis: Secondary | ICD-10-CM | POA: Diagnosis not present

## 2016-10-13 DIAGNOSIS — R1310 Dysphagia, unspecified: Secondary | ICD-10-CM | POA: Diagnosis not present

## 2016-10-17 DIAGNOSIS — R1310 Dysphagia, unspecified: Secondary | ICD-10-CM | POA: Diagnosis not present

## 2016-10-17 DIAGNOSIS — F0151 Vascular dementia with behavioral disturbance: Secondary | ICD-10-CM | POA: Diagnosis not present

## 2016-10-17 DIAGNOSIS — I739 Peripheral vascular disease, unspecified: Secondary | ICD-10-CM | POA: Diagnosis not present

## 2016-10-17 DIAGNOSIS — D638 Anemia in other chronic diseases classified elsewhere: Secondary | ICD-10-CM | POA: Diagnosis not present

## 2016-10-17 DIAGNOSIS — G20 Parkinson's disease: Secondary | ICD-10-CM | POA: Diagnosis not present

## 2016-10-17 DIAGNOSIS — F339 Major depressive disorder, recurrent, unspecified: Secondary | ICD-10-CM | POA: Diagnosis not present

## 2016-10-17 DIAGNOSIS — I10 Essential (primary) hypertension: Secondary | ICD-10-CM | POA: Diagnosis not present

## 2016-10-17 DIAGNOSIS — I679 Cerebrovascular disease, unspecified: Secondary | ICD-10-CM | POA: Diagnosis not present

## 2016-10-17 DIAGNOSIS — K219 Gastro-esophageal reflux disease without esophagitis: Secondary | ICD-10-CM | POA: Diagnosis not present

## 2016-10-17 DIAGNOSIS — I672 Cerebral atherosclerosis: Secondary | ICD-10-CM | POA: Diagnosis not present

## 2016-10-17 DIAGNOSIS — E1159 Type 2 diabetes mellitus with other circulatory complications: Secondary | ICD-10-CM | POA: Diagnosis not present

## 2016-10-19 DIAGNOSIS — F0151 Vascular dementia with behavioral disturbance: Secondary | ICD-10-CM | POA: Diagnosis not present

## 2016-10-19 DIAGNOSIS — I672 Cerebral atherosclerosis: Secondary | ICD-10-CM | POA: Diagnosis not present

## 2016-10-19 DIAGNOSIS — E1159 Type 2 diabetes mellitus with other circulatory complications: Secondary | ICD-10-CM | POA: Diagnosis not present

## 2016-10-19 DIAGNOSIS — R1310 Dysphagia, unspecified: Secondary | ICD-10-CM | POA: Diagnosis not present

## 2016-10-19 DIAGNOSIS — I739 Peripheral vascular disease, unspecified: Secondary | ICD-10-CM | POA: Diagnosis not present

## 2016-10-19 DIAGNOSIS — I679 Cerebrovascular disease, unspecified: Secondary | ICD-10-CM | POA: Diagnosis not present

## 2016-10-20 DIAGNOSIS — F0150 Vascular dementia without behavioral disturbance: Secondary | ICD-10-CM | POA: Diagnosis not present

## 2016-10-20 DIAGNOSIS — F339 Major depressive disorder, recurrent, unspecified: Secondary | ICD-10-CM | POA: Diagnosis not present

## 2016-10-20 DIAGNOSIS — F39 Unspecified mood [affective] disorder: Secondary | ICD-10-CM | POA: Diagnosis not present

## 2016-10-23 DIAGNOSIS — I739 Peripheral vascular disease, unspecified: Secondary | ICD-10-CM | POA: Diagnosis not present

## 2016-10-23 DIAGNOSIS — I679 Cerebrovascular disease, unspecified: Secondary | ICD-10-CM | POA: Diagnosis not present

## 2016-10-23 DIAGNOSIS — I672 Cerebral atherosclerosis: Secondary | ICD-10-CM | POA: Diagnosis not present

## 2016-10-23 DIAGNOSIS — E1159 Type 2 diabetes mellitus with other circulatory complications: Secondary | ICD-10-CM | POA: Diagnosis not present

## 2016-10-23 DIAGNOSIS — F0151 Vascular dementia with behavioral disturbance: Secondary | ICD-10-CM | POA: Diagnosis not present

## 2016-10-23 DIAGNOSIS — R1310 Dysphagia, unspecified: Secondary | ICD-10-CM | POA: Diagnosis not present

## 2016-10-25 DIAGNOSIS — I679 Cerebrovascular disease, unspecified: Secondary | ICD-10-CM | POA: Diagnosis not present

## 2016-10-25 DIAGNOSIS — I672 Cerebral atherosclerosis: Secondary | ICD-10-CM | POA: Diagnosis not present

## 2016-10-25 DIAGNOSIS — I739 Peripheral vascular disease, unspecified: Secondary | ICD-10-CM | POA: Diagnosis not present

## 2016-10-25 DIAGNOSIS — E1159 Type 2 diabetes mellitus with other circulatory complications: Secondary | ICD-10-CM | POA: Diagnosis not present

## 2016-10-25 DIAGNOSIS — R1310 Dysphagia, unspecified: Secondary | ICD-10-CM | POA: Diagnosis not present

## 2016-10-25 DIAGNOSIS — F0151 Vascular dementia with behavioral disturbance: Secondary | ICD-10-CM | POA: Diagnosis not present

## 2016-10-27 DIAGNOSIS — I672 Cerebral atherosclerosis: Secondary | ICD-10-CM | POA: Diagnosis not present

## 2016-10-27 DIAGNOSIS — E1159 Type 2 diabetes mellitus with other circulatory complications: Secondary | ICD-10-CM | POA: Diagnosis not present

## 2016-10-27 DIAGNOSIS — R1310 Dysphagia, unspecified: Secondary | ICD-10-CM | POA: Diagnosis not present

## 2016-10-27 DIAGNOSIS — I739 Peripheral vascular disease, unspecified: Secondary | ICD-10-CM | POA: Diagnosis not present

## 2016-10-27 DIAGNOSIS — I679 Cerebrovascular disease, unspecified: Secondary | ICD-10-CM | POA: Diagnosis not present

## 2016-10-27 DIAGNOSIS — F0151 Vascular dementia with behavioral disturbance: Secondary | ICD-10-CM | POA: Diagnosis not present

## 2016-10-28 ENCOUNTER — Encounter: Payer: Self-pay | Admitting: Adult Health

## 2016-10-28 ENCOUNTER — Non-Acute Institutional Stay (SKILLED_NURSING_FACILITY): Payer: Medicare Other | Admitting: Adult Health

## 2016-10-28 DIAGNOSIS — F0150 Vascular dementia without behavioral disturbance: Secondary | ICD-10-CM

## 2016-10-28 DIAGNOSIS — D72829 Elevated white blood cell count, unspecified: Secondary | ICD-10-CM | POA: Diagnosis not present

## 2016-10-28 DIAGNOSIS — Z8673 Personal history of transient ischemic attack (TIA), and cerebral infarction without residual deficits: Secondary | ICD-10-CM

## 2016-10-28 DIAGNOSIS — F39 Unspecified mood [affective] disorder: Secondary | ICD-10-CM

## 2016-10-28 DIAGNOSIS — G4700 Insomnia, unspecified: Secondary | ICD-10-CM | POA: Diagnosis not present

## 2016-10-28 DIAGNOSIS — F339 Major depressive disorder, recurrent, unspecified: Secondary | ICD-10-CM

## 2016-10-28 DIAGNOSIS — N400 Enlarged prostate without lower urinary tract symptoms: Secondary | ICD-10-CM | POA: Diagnosis not present

## 2016-10-28 NOTE — Progress Notes (Signed)
DATE:  10/28/2016 MRN:  [id number]  BIRTHDAY: 29-Jul-1921  Facility:  Nursing Home Location:  [facility] Room Number: 419-Q  LEVEL OF CARE:  SNF (31)  Contact Information    Name Relation Home Work Mobile   Caropreso,Edward Relative   [phone]   Bilger,Betsy Relative   [phone]   Jeremy Johann Daughter   [phone]   Webb,Tommy Relative   [place] Spouse [phone]         Code Status History    Date Active Date Inactive Code Status Order ID Comments User Context   07/09/2016  4:29 PM 07/12/2016  4:12 PM Full Code [id number]  Ulyses Amor, PA-C Inpatient   07/09/2016 10:54 AM 07/09/2016  4:29 PM DNR [id number]  Ulyses Amor, PA-C Inpatient   06/29/2016  5:21 PM 07/02/2016  7:18 PM DNR [id number]  Samella Parr, NP Inpatient   06/29/2016  2:26 PM 06/29/2016  5:21 PM DNR [id number]  Samella Parr, NP ED   08/05/2015  2:39 AM 08/06/2015  2:19 PM DNR [id number]  Toy Baker, MD Inpatient   07/04/2013  2:41 PM 08/05/2015  2:39 AM DNR [id number]  Hennie Duos, MD Outpatient   05/28/2013  2:30 PM 05/30/2013  8:20 PM Full Code [id number]  Ulyses Amor, PA-C Inpatient   05/21/2013 12:04 AM 05/28/2013  2:30 PM DNR [id number]  Toy Baker, MD Inpatient   07/15/2011  4:24 AM 07/19/2011  3:31 PM DNR [id number]  Kathie Dike, RN Inpatient    Advance Directive Documentation     Most Recent Value  Type of Advance Directive  Out of facility DNR (pink MOST or yellow form)  Pre-existing out of facility DNR order (yellow form or pink MOST form)  -  "MOST" Form in Place?  -       Chief Complaint  Patient presents with  . Medical Management of Chronic Issues    Routine visit    HISTORY OF PRESENT ILLNESS:  This is a 89-YO male seen for a routine visit.  He is a long-term care resident of [facility].  He is also under the care of [facility]. He was in the dining room and was brought to his room since wife  just came and will have lunch with him. He is pleasantly confused. He was able to tell me the  Year but not the month. He knows he is in [place], [place] but does not know the name of the facility. He asked the wife why she didn't come X 1 week who was with him just yesterday. He has PMH of CVA affecting left side in 2015, dementia, BPH with history of urinary retention, hypertension, diabetes, PVD, chronic leukocytosis.     PAST MEDICAL HISTORY:  Past Medical History:  Diagnosis Date  . Arthritis   . Carotid artery occlusion   . GERD (gastroesophageal reflux disease)   . Headache    "sometimes monthly" (06/29/2016)  . Heart block   . History of blood transfusion    "while in the service"  . History of stomach ulcers   . Hypertension   . Insomnia   . Pancreatitis   . Prostate cancer ([place])    Archie Endo 09/02/2010  . Prostate enlargement   . PVC's (premature ventricular contractions)   . PVD (peripheral vascular disease) ([place])   . Rhabdomyolysis 06/2016  . Stroke ([place]) 05/2013; 06/29/2016   Archie Endo 05/24/2013  . Type II diabetes mellitus ([place])  dx'd 2008   Archie Endo 08/19/2010     CURRENT MEDICATIONS: Reviewed  Patient's Medications  New Prescriptions   No medications on file  Previous Medications   ACETAMINOPHEN (TYLENOL) 325 MG TABLET    Take 650 mg by mouth every 6 (six) hours as needed for mild pain.   BISACODYL (DULCOLAX) 10 MG SUPPOSITORY    Place 10 mg rectally daily as needed for moderate constipation (for constipation not relieved by milk of magnesium).   DEXTROMETHORPHAN-GUAIFENESIN 10-100 MG/5ML LIQUID    Take 10 mLs by mouth every 6 (six) hours as needed (Cough).   DIVALPROEX (DEPAKOTE SPRINKLE) 125 MG CAPSULE    Take 125 mg by mouth 3 (three) times daily with meals.   MAGNESIUM HYDROXIDE (MILK OF MAGNESIA) 400 MG/5ML SUSPENSION    Take 30 mLs by mouth daily as needed (for constipation).   MELATONIN 3 MG TABS    Take 1 tablet by mouth at bedtime as needed (sleep).    SERTRALINE  (ZOLOFT) 50 MG TABLET    Take 50 mg by mouth daily.   TAMSULOSIN HCL (FLOMAX) 0.4 MG CAPS    Take 0.4 mg by mouth daily at 6 PM.   Modified Medications   No medications on file  Discontinued Medications   ASPIRIN 325 MG TABLET    Take 1 tablet (325 mg total) by mouth daily.     Allergies  Allergen Reactions  . Penicillins Rash    Has patient had a PCN reaction causing immediate rash, facial/tongue/throat swelling, SOB or lightheadedness with hypotension: No Has patient had a PCN reaction causing severe rash involving mucus membranes or skin necrosis: No Has patient had a PCN reaction that required hospitalization No Has patient had a PCN reaction occurring within the last 10 years: No If all of the above answers are "NO", then may proceed with Cephalosporin use.     REVIEW OF SYSTEMS:  GENERAL:  no fever or chills EARS: Denies change in hearing, ringing in ears, or earache NOSE: Denies nasal congestion or epistaxis MOUTH and THROAT: Denies oral discomfort, gingival pain or bleeding, pain from teeth or hoarseness   RESPIRATORY: no cough, SOB, DOE, wheezing, hemoptysis CARDIAC: no chest pain, edema or palpitations GI: no abdominal pain, diarrhea, constipation, heart burn, nausea or vomiting GU: Denies dysuria, frequency, hematuria, incontinence, or discharge PSYCHIATRIC: Denies feeling of depression or anxiety. No report of hallucinations, insomnia, paranoia, or agitation    PHYSICAL EXAMINATION  GENERAL APPEARANCE: Well nourished. In no acute distress. Normal body habitus SKIN:  Skin is warm and dry.  HEAD: Normal in size and contour. No evidence of trauma EYES: Lids open and close normally. No blepharitis, entropion or ectropion. PERRL. Conjunctivae are clear and sclerae are white. Lenses are without opacity EARS: Pinnae are normal. Patient hears normal voice tunes of the examiner MOUTH and THROAT: Lips are without lesions. Oral mucosa is moist and without lesions. Tongue is  normal in shape, size, and color and without lesions NECK: supple, trachea midline, no neck masses, no thyroid tenderness, no thyromegaly LYMPHATICS: no LAN in the neck, no supraclavicular LAN RESPIRATORY: breathing is even & unlabored, BS CTAB CARDIAC: RRR, no murmur,no extra heart sounds, no edema GI: abdomen soft, normal BS, no masses, no tenderness, no hepatomegaly, no splenomegaly EXTREMITIES:  Able to move X 4 extremities PSYCHIATRIC: Alert to self, disoriented to time and place. Affect and behavior are appropriate   LABS/RADIOLOGY: Labs reviewed: Basic Metabolic Panel:  Recent Labs  06/29/16 1413  07/02/16 3500  07/04/16 1546 07/09/16 1062  07/10/16 0306 07/12/16 07/28/16  NA  --   < > 141 142 140  < > 140 142 142  K  --   < > 3.7 3.7 3.9  < > 4.0 4.4 3.9  CL  --   < > 104 105 103  --  105  --   --   CO2  --   < > 26  --  27  --  29  --   --   GLUCOSE  --   < > 108* 100* 101*  --  141*  --   --   BUN  --   < > 6 17 13   --  9 14 29*  CREATININE  --   < > 0.88 1.10 1.13  --  0.94 0.9 0.9  CALCIUM  --   < > 8.8*  --  8.9  --  8.4*  --   --   MG 1.8  --   --   --   --   --   --   --   --   < > = values in this interval not displayed. Liver Function Tests:  Recent Labs  06/29/16 1132 06/30/16 0608  AST 108* 94*  ALT 30 31  ALKPHOS 60 50  BILITOT 1.5* 1.4*  PROT 6.8 5.8*  ALBUMIN 3.7 3.0*   CBC:  Recent Labs  06/29/16 1132  06/30/16 0608  07/09/16 0632  07/09/16 1201 07/10/16 0306 07/12/16  WBC 22.0*  --  14.9*  < > 12.8*  --  17.7* 12.7* 16.6  NEUTROABS 16.5*  --  9.5*  --   --   --   --   --   --   HGB 16.3  < > 14.0  < > 14.0  < > 9.7* 9.6* 10.2*  HCT 46.4  < > 40.5  < > 42.0  < > 28.8* 29.1* 30*  MCV 92.1  --  91.4  < > 93.8  --  93.2 94.8  --   PLT 277  --  219  < > 293  --  226 233 267  < > = values in this interval not displayed. Lipid Panel:  Recent Labs  06/30/16 0608  HDL 25*   Cardiac Enzymes:  Recent Labs  07/01/16 0416 07/02/16 0852  07/09/16 0632  CKTOTAL 1,243* 556* 105   CBG:  Recent Labs  07/09/16 1725 07/09/16 2127 07/10/16 0803  GLUCAP 148* 179* 121*      ASSESSMENT/PLAN:  1. Insomnia, unspecified type - continue melatonin 3 mg 1 tab by mouth daily at bedtime when necessary   2. Mood disorder (HCC) - mood this is stable; continue divalproex DR 125 mg 1 capsule by mouth 3 times a day; followed up by [facility] NP   3. Depression, recurrent ([place]) - stable; continue sertraline 50 mg 1 tab by mouth daily   4. Vascular dementia without behavioral disturbance - continue supportive care; fall precautions, followed up by hospice   5. Benign prostatic hyperplasia, unspecified whether lower urinary tract symptoms present - continue tamsulosin 0.4 mg 1 capsule by mouth every evening   6. Leukocytosis, unspecified type - this is chronic, no fever nor any source of infection, will monitor Lab Results  Component Value Date   WBC 16.6 07/12/2016    7. History of stroke - Plavix was recently discontinued, he is now comfort care    Goals of care:  Long-term care/Hospice  Lari Linson C. Solon - NP    [employer] [phone]

## 2016-10-29 DIAGNOSIS — E1159 Type 2 diabetes mellitus with other circulatory complications: Secondary | ICD-10-CM | POA: Diagnosis not present

## 2016-10-29 DIAGNOSIS — I672 Cerebral atherosclerosis: Secondary | ICD-10-CM | POA: Diagnosis not present

## 2016-10-29 DIAGNOSIS — R1310 Dysphagia, unspecified: Secondary | ICD-10-CM | POA: Diagnosis not present

## 2016-10-29 DIAGNOSIS — I679 Cerebrovascular disease, unspecified: Secondary | ICD-10-CM | POA: Diagnosis not present

## 2016-10-29 DIAGNOSIS — F0151 Vascular dementia with behavioral disturbance: Secondary | ICD-10-CM | POA: Diagnosis not present

## 2016-10-29 DIAGNOSIS — I739 Peripheral vascular disease, unspecified: Secondary | ICD-10-CM | POA: Diagnosis not present

## 2016-11-01 DIAGNOSIS — E1159 Type 2 diabetes mellitus with other circulatory complications: Secondary | ICD-10-CM | POA: Diagnosis not present

## 2016-11-01 DIAGNOSIS — I672 Cerebral atherosclerosis: Secondary | ICD-10-CM | POA: Diagnosis not present

## 2016-11-01 DIAGNOSIS — R1310 Dysphagia, unspecified: Secondary | ICD-10-CM | POA: Diagnosis not present

## 2016-11-01 DIAGNOSIS — I739 Peripheral vascular disease, unspecified: Secondary | ICD-10-CM | POA: Diagnosis not present

## 2016-11-01 DIAGNOSIS — I679 Cerebrovascular disease, unspecified: Secondary | ICD-10-CM | POA: Diagnosis not present

## 2016-11-01 DIAGNOSIS — F0151 Vascular dementia with behavioral disturbance: Secondary | ICD-10-CM | POA: Diagnosis not present

## 2016-11-03 ENCOUNTER — Ambulatory Visit: Payer: Medicare Other | Admitting: Podiatry

## 2016-11-03 DIAGNOSIS — R1310 Dysphagia, unspecified: Secondary | ICD-10-CM | POA: Diagnosis not present

## 2016-11-03 DIAGNOSIS — E1159 Type 2 diabetes mellitus with other circulatory complications: Secondary | ICD-10-CM | POA: Diagnosis not present

## 2016-11-03 DIAGNOSIS — I672 Cerebral atherosclerosis: Secondary | ICD-10-CM | POA: Diagnosis not present

## 2016-11-03 DIAGNOSIS — I679 Cerebrovascular disease, unspecified: Secondary | ICD-10-CM | POA: Diagnosis not present

## 2016-11-03 DIAGNOSIS — F0151 Vascular dementia with behavioral disturbance: Secondary | ICD-10-CM | POA: Diagnosis not present

## 2016-11-03 DIAGNOSIS — I739 Peripheral vascular disease, unspecified: Secondary | ICD-10-CM | POA: Diagnosis not present

## 2016-11-04 DIAGNOSIS — F0151 Vascular dementia with behavioral disturbance: Secondary | ICD-10-CM | POA: Diagnosis not present

## 2016-11-04 DIAGNOSIS — I672 Cerebral atherosclerosis: Secondary | ICD-10-CM | POA: Diagnosis not present

## 2016-11-04 DIAGNOSIS — R1310 Dysphagia, unspecified: Secondary | ICD-10-CM | POA: Diagnosis not present

## 2016-11-04 DIAGNOSIS — E1159 Type 2 diabetes mellitus with other circulatory complications: Secondary | ICD-10-CM | POA: Diagnosis not present

## 2016-11-04 DIAGNOSIS — I739 Peripheral vascular disease, unspecified: Secondary | ICD-10-CM | POA: Diagnosis not present

## 2016-11-04 DIAGNOSIS — I679 Cerebrovascular disease, unspecified: Secondary | ICD-10-CM | POA: Diagnosis not present

## 2016-11-05 DIAGNOSIS — E1159 Type 2 diabetes mellitus with other circulatory complications: Secondary | ICD-10-CM | POA: Diagnosis not present

## 2016-11-05 DIAGNOSIS — B351 Tinea unguium: Secondary | ICD-10-CM | POA: Diagnosis not present

## 2016-11-05 DIAGNOSIS — R1310 Dysphagia, unspecified: Secondary | ICD-10-CM | POA: Diagnosis not present

## 2016-11-05 DIAGNOSIS — F0151 Vascular dementia with behavioral disturbance: Secondary | ICD-10-CM | POA: Diagnosis not present

## 2016-11-05 DIAGNOSIS — L603 Nail dystrophy: Secondary | ICD-10-CM | POA: Diagnosis not present

## 2016-11-05 DIAGNOSIS — I672 Cerebral atherosclerosis: Secondary | ICD-10-CM | POA: Diagnosis not present

## 2016-11-05 DIAGNOSIS — I739 Peripheral vascular disease, unspecified: Secondary | ICD-10-CM | POA: Diagnosis not present

## 2016-11-05 DIAGNOSIS — I679 Cerebrovascular disease, unspecified: Secondary | ICD-10-CM | POA: Diagnosis not present

## 2016-11-08 DIAGNOSIS — E1159 Type 2 diabetes mellitus with other circulatory complications: Secondary | ICD-10-CM | POA: Diagnosis not present

## 2016-11-08 DIAGNOSIS — I672 Cerebral atherosclerosis: Secondary | ICD-10-CM | POA: Diagnosis not present

## 2016-11-08 DIAGNOSIS — I679 Cerebrovascular disease, unspecified: Secondary | ICD-10-CM | POA: Diagnosis not present

## 2016-11-08 DIAGNOSIS — F0151 Vascular dementia with behavioral disturbance: Secondary | ICD-10-CM | POA: Diagnosis not present

## 2016-11-08 DIAGNOSIS — I739 Peripheral vascular disease, unspecified: Secondary | ICD-10-CM | POA: Diagnosis not present

## 2016-11-08 DIAGNOSIS — R1310 Dysphagia, unspecified: Secondary | ICD-10-CM | POA: Diagnosis not present

## 2016-11-10 DIAGNOSIS — F0151 Vascular dementia with behavioral disturbance: Secondary | ICD-10-CM | POA: Diagnosis not present

## 2016-11-10 DIAGNOSIS — R1310 Dysphagia, unspecified: Secondary | ICD-10-CM | POA: Diagnosis not present

## 2016-11-10 DIAGNOSIS — E1159 Type 2 diabetes mellitus with other circulatory complications: Secondary | ICD-10-CM | POA: Diagnosis not present

## 2016-11-10 DIAGNOSIS — I672 Cerebral atherosclerosis: Secondary | ICD-10-CM | POA: Diagnosis not present

## 2016-11-10 DIAGNOSIS — I679 Cerebrovascular disease, unspecified: Secondary | ICD-10-CM | POA: Diagnosis not present

## 2016-11-10 DIAGNOSIS — I739 Peripheral vascular disease, unspecified: Secondary | ICD-10-CM | POA: Diagnosis not present

## 2016-11-11 DIAGNOSIS — Z961 Presence of intraocular lens: Secondary | ICD-10-CM | POA: Diagnosis not present

## 2016-11-11 DIAGNOSIS — E119 Type 2 diabetes mellitus without complications: Secondary | ICD-10-CM | POA: Diagnosis not present

## 2016-11-11 LAB — HM DIABETES EYE EXAM

## 2016-11-15 ENCOUNTER — Emergency Department (HOSPITAL_COMMUNITY)

## 2016-11-15 ENCOUNTER — Encounter (HOSPITAL_COMMUNITY): Payer: Self-pay

## 2016-11-15 ENCOUNTER — Emergency Department (HOSPITAL_COMMUNITY)
Admission: EM | Admit: 2016-11-15 | Discharge: 2016-11-15 | Disposition: A | Discharge status: Home / Self Care | Attending: Emergency Medicine | Admitting: Emergency Medicine

## 2016-11-15 DIAGNOSIS — W050XXA Fall from non-moving wheelchair, initial encounter: Secondary | ICD-10-CM | POA: Insufficient documentation

## 2016-11-15 DIAGNOSIS — Y92129 Unspecified place in nursing home as the place of occurrence of the external cause: Secondary | ICD-10-CM | POA: Diagnosis not present

## 2016-11-15 DIAGNOSIS — Z87891 Personal history of nicotine dependence: Secondary | ICD-10-CM | POA: Diagnosis not present

## 2016-11-15 DIAGNOSIS — Z8546 Personal history of malignant neoplasm of prostate: Secondary | ICD-10-CM | POA: Insufficient documentation

## 2016-11-15 DIAGNOSIS — Z79899 Other long term (current) drug therapy: Secondary | ICD-10-CM | POA: Insufficient documentation

## 2016-11-15 DIAGNOSIS — Y999 Unspecified external cause status: Secondary | ICD-10-CM | POA: Insufficient documentation

## 2016-11-15 DIAGNOSIS — I672 Cerebral atherosclerosis: Secondary | ICD-10-CM | POA: Diagnosis not present

## 2016-11-15 DIAGNOSIS — R220 Localized swelling, mass and lump, head: Secondary | ICD-10-CM | POA: Diagnosis not present

## 2016-11-15 DIAGNOSIS — I10 Essential (primary) hypertension: Secondary | ICD-10-CM | POA: Diagnosis not present

## 2016-11-15 DIAGNOSIS — F0151 Vascular dementia with behavioral disturbance: Secondary | ICD-10-CM | POA: Diagnosis not present

## 2016-11-15 DIAGNOSIS — Y939 Activity, unspecified: Secondary | ICD-10-CM | POA: Insufficient documentation

## 2016-11-15 DIAGNOSIS — R4182 Altered mental status, unspecified: Secondary | ICD-10-CM | POA: Diagnosis not present

## 2016-11-15 DIAGNOSIS — Z8673 Personal history of transient ischemic attack (TIA), and cerebral infarction without residual deficits: Secondary | ICD-10-CM | POA: Insufficient documentation

## 2016-11-15 DIAGNOSIS — I2510 Atherosclerotic heart disease of native coronary artery without angina pectoris: Secondary | ICD-10-CM | POA: Insufficient documentation

## 2016-11-15 DIAGNOSIS — W19XXXA Unspecified fall, initial encounter: Secondary | ICD-10-CM

## 2016-11-15 DIAGNOSIS — E1159 Type 2 diabetes mellitus with other circulatory complications: Secondary | ICD-10-CM | POA: Diagnosis not present

## 2016-11-15 DIAGNOSIS — I739 Peripheral vascular disease, unspecified: Secondary | ICD-10-CM | POA: Diagnosis not present

## 2016-11-15 DIAGNOSIS — F0390 Unspecified dementia without behavioral disturbance: Secondary | ICD-10-CM | POA: Insufficient documentation

## 2016-11-15 DIAGNOSIS — K1379 Other lesions of oral mucosa: Secondary | ICD-10-CM | POA: Diagnosis not present

## 2016-11-15 DIAGNOSIS — T148XXA Other injury of unspecified body region, initial encounter: Secondary | ICD-10-CM | POA: Diagnosis not present

## 2016-11-15 DIAGNOSIS — R600 Localized edema: Secondary | ICD-10-CM | POA: Insufficient documentation

## 2016-11-15 DIAGNOSIS — E119 Type 2 diabetes mellitus without complications: Secondary | ICD-10-CM | POA: Diagnosis not present

## 2016-11-15 DIAGNOSIS — S0993XA Unspecified injury of face, initial encounter: Secondary | ICD-10-CM | POA: Diagnosis not present

## 2016-11-15 DIAGNOSIS — I679 Cerebrovascular disease, unspecified: Secondary | ICD-10-CM | POA: Diagnosis not present

## 2016-11-15 DIAGNOSIS — R1310 Dysphagia, unspecified: Secondary | ICD-10-CM | POA: Diagnosis not present

## 2016-11-15 MED ORDER — ACETAMINOPHEN 325 MG PO TABS
650.0000 mg | ORAL_TABLET | Freq: Once | ORAL | Status: AC
Start: 2016-11-15 — End: 2016-11-15
  Administered 2016-11-15: 650 mg via ORAL
  Filled 2016-11-15: qty 2

## 2016-11-15 NOTE — Discharge Instructions (Signed)
Please read and follow all provided instructions.  Your diagnoses today include:  1. Fall, initial encounter   2. Right facial swelling     Tests performed today include: Vital signs. See below for your results today.   Medications prescribed:  Take as prescribed   Home care instructions:  Follow any educational materials contained in this packet.  Follow-up instructions: Please follow-up with your primary care provider for further evaluation of symptoms and treatment   Return instructions:  Please return to the Emergency Department if you do not get better, if you get worse, or new symptoms OR  - Fever (temperature greater than 101.37F)  - Bleeding that does not stop with holding pressure to the area    -Severe pain (please note that you may be more sore the day after your accident)  - Chest Pain  - Difficulty breathing  - Severe nausea or vomiting  - Inability to tolerate food and liquids  - Passing out  - Skin becoming red around your wounds  - Change in mental status (confusion or lethargy)  - New numbness or weakness    Please return if you have any other emergent concerns.  Additional Information:  Your vital signs today were: BP (!) 183/112    Pulse 77    Temp 98.1 F (36.7 C) (Oral)    Resp 17    Ht 5\' 11"  (1.803 m)    Wt 88.5 kg (195 lb)    SpO2 94%    BMI 27.20 kg/m  If your blood pressure (BP) was elevated above 135/85 this visit, please have this repeated by your doctor within one month. ---------------

## 2016-11-15 NOTE — ED Provider Notes (Signed)
[facility] DEPT Provider Note   CSN: [id number] Arrival date & time: 11/15/16  1626     History   Chief Complaint Chief Complaint  Patient presents with  . Fall  . Facial Swelling  . Jaw Pain    HPI Zachary [name] is a 83 y.o. male.  HPI  83 y.o. male with a hx of HTN, DM2, presents to the Emergency Department today due to witnessed fall at nursing home. Pt baseline in wheelchair and attempted to ambulate without assistance. Trauma noted to right side of jaw from fall PTA against nursing station. No LOC. Noted swelling and difficulty opening mouth due to pain. No N/V. No vision changes. Denies pain currently, but pt is advanced stage dementia. Daughter states pt is hospice with DNR. Attends SNF. No other symptoms noted    Past Medical History:  Diagnosis Date  . Arthritis   . Carotid artery occlusion   . GERD (gastroesophageal reflux disease)   . Headache    "sometimes monthly" (06/29/2016)  . Heart block   . History of blood transfusion    "while in the service"  . History of stomach ulcers   . Hypertension   . Insomnia   . Pancreatitis   . Prostate cancer ([place])    Archie Endo 09/02/2010  . Prostate enlargement   . PVC's (premature ventricular contractions)   . PVD (peripheral vascular disease) ([place])   . Rhabdomyolysis 06/2016  . Stroke ([place]) 05/2013; 06/29/2016   Archie Endo 05/24/2013  . Type II diabetes mellitus ([place]) dx'd 2008   Archie Endo 08/19/2010    Patient Active Problem List   Diagnosis Date Noted  . Carotid stenosis 07/09/2016  . Goals of care, counseling/discussion   . Advance care planning   . Palliative care by specialist   . Stenosis of left carotid artery   . Rhabdomyolysis 06/29/2016  . Stroke-like symptoms 06/29/2016  . Acromioclavicular [facility]) joint injury, right, (chronic separation) 06/29/2016  . BPH (benign prostatic hyperplasia) 06/29/2016  . HLD (hyperlipidemia) 06/29/2016  . PAC (premature atrial contraction) 06/29/2016  . Cerebral embolism with  cerebral infarction 06/29/2016  . Diabetes mellitus with complication ([place])   . Aortic sclerosis 09/18/2015  . Fatigue 09/18/2015  . Pre-syncope 08/05/2015  . Leukocytosis 08/04/2015  . Syncope 08/04/2015  . Hypogonadism male 08/28/2014  . Dementia without behavioral disturbance 07/04/2013  . Urinary retention 05/29/2013  . Carotid artery disease ([place]) 05/28/2013  . History of right-sided carotid endarterectomy 2015 05/28/2013  . Pulsus bigeminis 05/20/2013  . Acute ischemic stroke ([place]) 05/20/2013  . Hypertension 07/15/2011  . PVD (peripheral vascular disease) ([place])     Past Surgical History:  Procedure Laterality Date  . CATARACT EXTRACTION W/ INTRAOCULAR LENS  IMPLANT, BILATERAL Bilateral   . CHOLECYSTECTOMY  07/18/2011   Procedure: LAPAROSCOPIC CHOLECYSTECTOMY;  Surgeon: Gwenyth Ober, MD;  Location: [facility];  Service: General;  Laterality: N/A;  . ENDARTERECTOMY Right 05/28/2013   Procedure: ENDARTERECTOMY CAROTID;  Surgeon: Rosetta Posner, MD;  Location: [facility];  Service: Vascular;  Laterality: Right;  . ENDARTERECTOMY Left 07/09/2016   Procedure: ENDARTERECTOMY CAROTID;  Surgeon: Rosetta Posner, MD;  Location: [facility];  Service: Vascular;  Laterality: Left;  . ESOPHAGOGASTRODUODENOSCOPY (EGD) WITH ESOPHAGEAL DILATION  2004; 12/2009   Archie Endo 01/08/2010  . FLEXIBLE SIGMOIDOSCOPY N/A 09/23/2014   Procedure: FLEXIBLE SIGMOIDOSCOPY;  Surgeon: Garlan Fair, MD;  [facility];  Service: Endoscopy;  Laterality: N/A;  . INSERTION PROSTATE RADIATION SEED  03/1999   Archie Endo 09/02/2010  .  PATCH ANGIOPLASTY Left 07/09/2016   Procedure: PATCH ANGIOPLASTY LEFT CAROTID ARTERY;  Surgeon: Rosetta Posner, MD;  Location: [facility];  Service: Vascular;  Laterality: Left;  . PROSTATE BIOPSY  2000   Archie Endo 09/02/2010  . TONSILLECTOMY         Home Medications    Prior to Admission medications   Medication Sig Start Date End Date Taking? Authorizing Provider  acetaminophen (TYLENOL) 325 MG tablet  Take 650 mg by mouth every 6 (six) hours as needed for mild pain.    [provider]  bisacodyl (DULCOLAX) 10 MG suppository Place 10 mg rectally daily as needed for moderate constipation (for constipation not relieved by milk of magnesium).    [provider]  Dextromethorphan-Guaifenesin 10-100 MG/5ML liquid Take 10 mLs by mouth every 6 (six) hours as needed (Cough).    [provider]  divalproex (DEPAKOTE SPRINKLE) 125 MG capsule Take 125 mg by mouth 3 (three) times daily with meals.    [provider]  magnesium hydroxide (MILK OF MAGNESIA) 400 MG/5ML suspension Take 30 mLs by mouth daily as needed (for constipation).    [provider]  Melatonin 3 MG TABS Take 1 tablet by mouth at bedtime as needed (sleep).     [provider]  sertraline (ZOLOFT) 50 MG tablet Take 50 mg by mouth daily.    [provider]  Tamsulosin HCl (FLOMAX) 0.4 MG CAPS Take 0.4 mg by mouth daily at 6 PM.     [provider]    Family History Family History  Problem Relation Age of Onset  . Breast cancer Daughter   . Cancer Daughter 85       died of cancer   . Stroke Father   . Other Unknown        hypogonadism  . Heart attack Neg Hx   . Hypertension Neg Hx     Social History Social History  Substance Use Topics  . Smoking status: Former Smoker    Packs/day: 3.00    Years: 20.00    Quit date: 04/19/1962  . Smokeless tobacco: Never Used  . Alcohol use No     Allergies   Penicillins   Review of Systems Review of Systems  Unable to perform ROS: Dementia     Physical Exam Updated Vital Signs Temp 98.1 F (36.7 C) (Oral)   Ht 5\' 11"  (1.803 m)   Wt 88.5 kg (195 lb)   BMI 27.20 kg/m   Physical Exam  Constitutional: Vital signs are normal. He appears well-developed and well-nourished. No distress.  HENT:  Head: Normocephalic and atraumatic. Head is without raccoon's eyes and without Battle's sign.  Right Ear: No  hemotympanum.  Left Ear: No hemotympanum.  Nose: Nose normal.  Mouth/Throat: Uvula is midline, oropharynx is clear and moist and mucous membranes are normal.  Swelling and TTP along right mandible. Able to open jaw without difficulty. No dental abnormalities. Posterior oropharynx unremarkable.   Eyes: Pupils are equal, round, and reactive to light. EOM are normal.  Neck: Trachea normal and normal range of motion. Neck supple. No spinous process tenderness and no muscular tenderness present. No tracheal deviation and normal range of motion present.  Cardiovascular: Normal rate, regular rhythm, S1 normal, S2 normal, normal heart sounds, intact distal pulses and normal pulses.   Pulmonary/Chest: Effort normal and breath sounds normal. No respiratory distress. He has no decreased breath sounds. He has no wheezes. He has no rhonchi. He has no rales.  Abdominal: Normal appearance and bowel sounds are normal. There is no tenderness. There is no rigidity and no guarding.  Musculoskeletal: Normal range of motion.  Neurological: He is alert. He has normal strength. He is disoriented (baseline). No cranial nerve deficit or sensory deficit.  Skin: Skin is warm and dry.  Psychiatric: He has a normal mood and affect. His speech is normal and behavior is normal.  Nursing note and vitals reviewed.    ED Treatments / Results  Labs (all labs ordered are listed, but only abnormal results are displayed) Labs Reviewed - No data to display  EKG  EKG Interpretation None       Radiology Ct Maxillofacial Wo Contrast  Result Date: 11/15/2016 CLINICAL DATA:  Fall with right-sided facial injury, swelling and pain. Initial encounter. EXAM: CT MAXILLOFACIAL WITHOUT CONTRAST TECHNIQUE: Multidetector CT imaging of the maxillofacial structures was performed. Multiplanar CT image reconstructions were also generated. COMPARISON:  Prior maxillofacial CT on 12/31/2014 FINDINGS: Osseous: No evidence of acute fracture.  The temporomandibular joints show normal alignment without subluxation or dislocation. The skull base appears intact. Orbits: No evidence of orbital fracture or injury. The globes and extraocular muscles are symmetric and normal in appearance. Sinuses: Paranasal sinuses and mastoid air cells are normally aerated. Soft tissues: There is a large amount of focal soft tissue swelling and subcutaneous hematoma developing the region of the right maseter muscle and adjacent to the right mandible. No soft tissue foreign body is identified. Limited intracranial: No significant or unexpected finding. IMPRESSION: Soft tissue swelling with hematoma formation over the right maseter muscle/mandibular region. No evidence of maxillofacial fracture. Electronically Signed   By: Aletta Edouard M.D.   On: 11/15/2016 19:00    Procedures Procedures (including critical care time)  Medications Ordered in ED Medications  acetaminophen (TYLENOL) tablet 650 mg (650 mg Oral Given 11/15/16 1819)     Initial Impression / Assessment and Plan / ED Course  I have reviewed the triage vital signs and the nursing notes.  Pertinent labs & imaging results that were available during my care of the patient were reviewed by me and considered in my medical decision making (see chart for details).  Final Clinical Impressions(s) / ED Diagnoses   {I have reviewed and evaluated the relevant imaging studies.  {I have reviewed the relevant previous healthcare records.  {I obtained HPI from historian. {Patient discussed with supervising physician.  ED Course:  Assessment: Pt is a 83 y.o. male with a hx of HTN, DM2, presents to the Emergency Department today due to witnessed fall at nursing home. Pt baseline in wheelchair and attempted to ambulate without assistance. Trauma noted to right side of jaw from fall PTA against nursing station. No LOC. Noted swelling and difficulty opening mouth due to pain. No N/V. No vision changes. Denies pain  currently, but pt is advanced stage dementia. Daughter states pt is hospice with DNR. Attends SNF. On exam, pt in NAD. Nontoxic/nonseptic appearing. VSS. Afebrile. Lungs CTA. Heart RRR. Abdomen nontender soft. Swelling to right mandible. TTP. Able to open jaw. No dental abnormalities. Discussed with patient daughter who is power of attorney about CT Head and risk of bleeding. Pt daughter declined imaging as pt on Hospice and is DNR. Pt not on anticoagulation. CT Max/C spine unremarkable. Hematoma noted. Seen by attending physician. Plan is to Dc home with follow up to PCP. At time of discharge, Patient is in no acute distress. Vital Signs are stable. Patient able to tolerate PO.  Disposition/Plan:  DC Home Additional Verbal discharge instructions given and discussed with patient.  Pt Instructed to f/u with PCP in the next week for evaluation and treatment of symptoms. Return precautions given Pt acknowledges and agrees with plan  Supervising Physician Mesner, Corene Cornea, MD  Final diagnoses:  Fall, initial encounter  Right facial swelling    New Prescriptions New Prescriptions   No medications on file     Shary Decamp, Hershal Coria 11/15/16 1930    Mesner, Corene Cornea, MD 11/16/16 [phone]

## 2016-11-15 NOTE — ED Triage Notes (Signed)
Witness fall at nursing home with no LOC ut struck right jaw and now swelling and pain with difficulty opening mouth.

## 2016-11-15 NOTE — ED Notes (Signed)
Called report to Perkins, LPN at [facility] and rehab. [facility] called for transport

## 2016-11-15 NOTE — ED Notes (Signed)
Report given to [place], EMT from [facility]. DNR form and facilty paperwork given to [facility].

## 2016-11-15 NOTE — ED Notes (Signed)
Patient transported to CT 

## 2016-11-15 NOTE — ED Notes (Signed)
Pt and family understood Science writer. Report called to facility at [facility]. Langley Gauss, EMT from [facility] also given report and paper work from [facility], including DNR form

## 2016-11-15 NOTE — ED Notes (Signed)
Per dr. Robby Sermon off monitors. Fresh brief applied.

## 2016-11-16 DIAGNOSIS — I679 Cerebrovascular disease, unspecified: Secondary | ICD-10-CM | POA: Diagnosis not present

## 2016-11-16 DIAGNOSIS — I672 Cerebral atherosclerosis: Secondary | ICD-10-CM | POA: Diagnosis not present

## 2016-11-16 DIAGNOSIS — R1310 Dysphagia, unspecified: Secondary | ICD-10-CM | POA: Diagnosis not present

## 2016-11-16 DIAGNOSIS — F0151 Vascular dementia with behavioral disturbance: Secondary | ICD-10-CM | POA: Diagnosis not present

## 2016-11-16 DIAGNOSIS — I739 Peripheral vascular disease, unspecified: Secondary | ICD-10-CM | POA: Diagnosis not present

## 2016-11-16 DIAGNOSIS — E1159 Type 2 diabetes mellitus with other circulatory complications: Secondary | ICD-10-CM | POA: Diagnosis not present

## 2016-11-17 DIAGNOSIS — E1159 Type 2 diabetes mellitus with other circulatory complications: Secondary | ICD-10-CM | POA: Diagnosis not present

## 2016-11-17 DIAGNOSIS — I10 Essential (primary) hypertension: Secondary | ICD-10-CM | POA: Diagnosis not present

## 2016-11-17 DIAGNOSIS — N390 Urinary tract infection, site not specified: Secondary | ICD-10-CM | POA: Diagnosis not present

## 2016-11-17 DIAGNOSIS — F0151 Vascular dementia with behavioral disturbance: Secondary | ICD-10-CM | POA: Diagnosis not present

## 2016-11-17 DIAGNOSIS — I739 Peripheral vascular disease, unspecified: Secondary | ICD-10-CM | POA: Diagnosis not present

## 2016-11-17 DIAGNOSIS — I672 Cerebral atherosclerosis: Secondary | ICD-10-CM | POA: Diagnosis not present

## 2016-11-17 DIAGNOSIS — K219 Gastro-esophageal reflux disease without esophagitis: Secondary | ICD-10-CM | POA: Diagnosis not present

## 2016-11-17 DIAGNOSIS — I679 Cerebrovascular disease, unspecified: Secondary | ICD-10-CM | POA: Diagnosis not present

## 2016-11-17 DIAGNOSIS — D638 Anemia in other chronic diseases classified elsewhere: Secondary | ICD-10-CM | POA: Diagnosis not present

## 2016-11-17 DIAGNOSIS — R319 Hematuria, unspecified: Secondary | ICD-10-CM | POA: Diagnosis not present

## 2016-11-17 DIAGNOSIS — N37 Urethral disorders in diseases classified elsewhere: Secondary | ICD-10-CM | POA: Diagnosis not present

## 2016-11-17 DIAGNOSIS — G20 Parkinson's disease: Secondary | ICD-10-CM | POA: Diagnosis not present

## 2016-11-17 DIAGNOSIS — R1310 Dysphagia, unspecified: Secondary | ICD-10-CM | POA: Diagnosis not present

## 2016-11-17 DIAGNOSIS — F339 Major depressive disorder, recurrent, unspecified: Secondary | ICD-10-CM | POA: Diagnosis not present

## 2016-11-18 DIAGNOSIS — F0151 Vascular dementia with behavioral disturbance: Secondary | ICD-10-CM | POA: Diagnosis not present

## 2016-11-18 DIAGNOSIS — E1159 Type 2 diabetes mellitus with other circulatory complications: Secondary | ICD-10-CM | POA: Diagnosis not present

## 2016-11-18 DIAGNOSIS — I679 Cerebrovascular disease, unspecified: Secondary | ICD-10-CM | POA: Diagnosis not present

## 2016-11-18 DIAGNOSIS — I672 Cerebral atherosclerosis: Secondary | ICD-10-CM | POA: Diagnosis not present

## 2016-11-18 DIAGNOSIS — I739 Peripheral vascular disease, unspecified: Secondary | ICD-10-CM | POA: Diagnosis not present

## 2016-11-18 DIAGNOSIS — R1310 Dysphagia, unspecified: Secondary | ICD-10-CM | POA: Diagnosis not present

## 2016-11-20 DIAGNOSIS — I739 Peripheral vascular disease, unspecified: Secondary | ICD-10-CM | POA: Diagnosis not present

## 2016-11-20 DIAGNOSIS — F0151 Vascular dementia with behavioral disturbance: Secondary | ICD-10-CM | POA: Diagnosis not present

## 2016-11-20 DIAGNOSIS — I679 Cerebrovascular disease, unspecified: Secondary | ICD-10-CM | POA: Diagnosis not present

## 2016-11-20 DIAGNOSIS — E1159 Type 2 diabetes mellitus with other circulatory complications: Secondary | ICD-10-CM | POA: Diagnosis not present

## 2016-11-20 DIAGNOSIS — I672 Cerebral atherosclerosis: Secondary | ICD-10-CM | POA: Diagnosis not present

## 2016-11-20 DIAGNOSIS — R1310 Dysphagia, unspecified: Secondary | ICD-10-CM | POA: Diagnosis not present

## 2016-11-22 DIAGNOSIS — I679 Cerebrovascular disease, unspecified: Secondary | ICD-10-CM | POA: Diagnosis not present

## 2016-11-22 DIAGNOSIS — I739 Peripheral vascular disease, unspecified: Secondary | ICD-10-CM | POA: Diagnosis not present

## 2016-11-22 DIAGNOSIS — E1159 Type 2 diabetes mellitus with other circulatory complications: Secondary | ICD-10-CM | POA: Diagnosis not present

## 2016-11-22 DIAGNOSIS — F0151 Vascular dementia with behavioral disturbance: Secondary | ICD-10-CM | POA: Diagnosis not present

## 2016-11-22 DIAGNOSIS — I672 Cerebral atherosclerosis: Secondary | ICD-10-CM | POA: Diagnosis not present

## 2016-11-22 DIAGNOSIS — R1310 Dysphagia, unspecified: Secondary | ICD-10-CM | POA: Diagnosis not present

## 2016-11-25 DIAGNOSIS — I672 Cerebral atherosclerosis: Secondary | ICD-10-CM | POA: Diagnosis not present

## 2016-11-25 DIAGNOSIS — R1310 Dysphagia, unspecified: Secondary | ICD-10-CM | POA: Diagnosis not present

## 2016-11-25 DIAGNOSIS — I679 Cerebrovascular disease, unspecified: Secondary | ICD-10-CM | POA: Diagnosis not present

## 2016-11-25 DIAGNOSIS — E1159 Type 2 diabetes mellitus with other circulatory complications: Secondary | ICD-10-CM | POA: Diagnosis not present

## 2016-11-25 DIAGNOSIS — I739 Peripheral vascular disease, unspecified: Secondary | ICD-10-CM | POA: Diagnosis not present

## 2016-11-25 DIAGNOSIS — F0151 Vascular dementia with behavioral disturbance: Secondary | ICD-10-CM | POA: Diagnosis not present

## 2016-11-26 DIAGNOSIS — F0151 Vascular dementia with behavioral disturbance: Secondary | ICD-10-CM | POA: Diagnosis not present

## 2016-11-26 DIAGNOSIS — I739 Peripheral vascular disease, unspecified: Secondary | ICD-10-CM | POA: Diagnosis not present

## 2016-11-26 DIAGNOSIS — R1310 Dysphagia, unspecified: Secondary | ICD-10-CM | POA: Diagnosis not present

## 2016-11-26 DIAGNOSIS — I679 Cerebrovascular disease, unspecified: Secondary | ICD-10-CM | POA: Diagnosis not present

## 2016-11-26 DIAGNOSIS — I672 Cerebral atherosclerosis: Secondary | ICD-10-CM | POA: Diagnosis not present

## 2016-11-26 DIAGNOSIS — E1159 Type 2 diabetes mellitus with other circulatory complications: Secondary | ICD-10-CM | POA: Diagnosis not present

## 2016-11-29 DIAGNOSIS — I672 Cerebral atherosclerosis: Secondary | ICD-10-CM | POA: Diagnosis not present

## 2016-11-29 DIAGNOSIS — I739 Peripheral vascular disease, unspecified: Secondary | ICD-10-CM | POA: Diagnosis not present

## 2016-11-29 DIAGNOSIS — E1159 Type 2 diabetes mellitus with other circulatory complications: Secondary | ICD-10-CM | POA: Diagnosis not present

## 2016-11-29 DIAGNOSIS — I679 Cerebrovascular disease, unspecified: Secondary | ICD-10-CM | POA: Diagnosis not present

## 2016-11-29 DIAGNOSIS — F0151 Vascular dementia with behavioral disturbance: Secondary | ICD-10-CM | POA: Diagnosis not present

## 2016-11-29 DIAGNOSIS — R1310 Dysphagia, unspecified: Secondary | ICD-10-CM | POA: Diagnosis not present

## 2016-11-30 DIAGNOSIS — I739 Peripheral vascular disease, unspecified: Secondary | ICD-10-CM | POA: Diagnosis not present

## 2016-11-30 DIAGNOSIS — I679 Cerebrovascular disease, unspecified: Secondary | ICD-10-CM | POA: Diagnosis not present

## 2016-11-30 DIAGNOSIS — I672 Cerebral atherosclerosis: Secondary | ICD-10-CM | POA: Diagnosis not present

## 2016-11-30 DIAGNOSIS — E1159 Type 2 diabetes mellitus with other circulatory complications: Secondary | ICD-10-CM | POA: Diagnosis not present

## 2016-11-30 DIAGNOSIS — F0151 Vascular dementia with behavioral disturbance: Secondary | ICD-10-CM | POA: Diagnosis not present

## 2016-11-30 DIAGNOSIS — R1310 Dysphagia, unspecified: Secondary | ICD-10-CM | POA: Diagnosis not present

## 2016-12-01 DIAGNOSIS — I672 Cerebral atherosclerosis: Secondary | ICD-10-CM | POA: Diagnosis not present

## 2016-12-01 DIAGNOSIS — I739 Peripheral vascular disease, unspecified: Secondary | ICD-10-CM | POA: Diagnosis not present

## 2016-12-01 DIAGNOSIS — F0151 Vascular dementia with behavioral disturbance: Secondary | ICD-10-CM | POA: Diagnosis not present

## 2016-12-01 DIAGNOSIS — I679 Cerebrovascular disease, unspecified: Secondary | ICD-10-CM | POA: Diagnosis not present

## 2016-12-01 DIAGNOSIS — E1159 Type 2 diabetes mellitus with other circulatory complications: Secondary | ICD-10-CM | POA: Diagnosis not present

## 2016-12-01 DIAGNOSIS — R1310 Dysphagia, unspecified: Secondary | ICD-10-CM | POA: Diagnosis not present

## 2016-12-03 DIAGNOSIS — F0151 Vascular dementia with behavioral disturbance: Secondary | ICD-10-CM | POA: Diagnosis not present

## 2016-12-03 DIAGNOSIS — E1159 Type 2 diabetes mellitus with other circulatory complications: Secondary | ICD-10-CM | POA: Diagnosis not present

## 2016-12-03 DIAGNOSIS — I672 Cerebral atherosclerosis: Secondary | ICD-10-CM | POA: Diagnosis not present

## 2016-12-03 DIAGNOSIS — I679 Cerebrovascular disease, unspecified: Secondary | ICD-10-CM | POA: Diagnosis not present

## 2016-12-03 DIAGNOSIS — R1310 Dysphagia, unspecified: Secondary | ICD-10-CM | POA: Diagnosis not present

## 2016-12-03 DIAGNOSIS — I739 Peripheral vascular disease, unspecified: Secondary | ICD-10-CM | POA: Diagnosis not present

## 2016-12-06 DIAGNOSIS — E1159 Type 2 diabetes mellitus with other circulatory complications: Secondary | ICD-10-CM | POA: Diagnosis not present

## 2016-12-06 DIAGNOSIS — I679 Cerebrovascular disease, unspecified: Secondary | ICD-10-CM | POA: Diagnosis not present

## 2016-12-06 DIAGNOSIS — F0151 Vascular dementia with behavioral disturbance: Secondary | ICD-10-CM | POA: Diagnosis not present

## 2016-12-06 DIAGNOSIS — I739 Peripheral vascular disease, unspecified: Secondary | ICD-10-CM | POA: Diagnosis not present

## 2016-12-06 DIAGNOSIS — I672 Cerebral atherosclerosis: Secondary | ICD-10-CM | POA: Diagnosis not present

## 2016-12-06 DIAGNOSIS — R1310 Dysphagia, unspecified: Secondary | ICD-10-CM | POA: Diagnosis not present

## 2016-12-07 DIAGNOSIS — F0151 Vascular dementia with behavioral disturbance: Secondary | ICD-10-CM | POA: Diagnosis not present

## 2016-12-07 DIAGNOSIS — E1159 Type 2 diabetes mellitus with other circulatory complications: Secondary | ICD-10-CM | POA: Diagnosis not present

## 2016-12-07 DIAGNOSIS — I739 Peripheral vascular disease, unspecified: Secondary | ICD-10-CM | POA: Diagnosis not present

## 2016-12-07 DIAGNOSIS — I672 Cerebral atherosclerosis: Secondary | ICD-10-CM | POA: Diagnosis not present

## 2016-12-07 DIAGNOSIS — I679 Cerebrovascular disease, unspecified: Secondary | ICD-10-CM | POA: Diagnosis not present

## 2016-12-07 DIAGNOSIS — R1310 Dysphagia, unspecified: Secondary | ICD-10-CM | POA: Diagnosis not present

## 2016-12-08 DIAGNOSIS — I679 Cerebrovascular disease, unspecified: Secondary | ICD-10-CM | POA: Diagnosis not present

## 2016-12-08 DIAGNOSIS — R1310 Dysphagia, unspecified: Secondary | ICD-10-CM | POA: Diagnosis not present

## 2016-12-08 DIAGNOSIS — F0151 Vascular dementia with behavioral disturbance: Secondary | ICD-10-CM | POA: Diagnosis not present

## 2016-12-08 DIAGNOSIS — I672 Cerebral atherosclerosis: Secondary | ICD-10-CM | POA: Diagnosis not present

## 2016-12-08 DIAGNOSIS — E1159 Type 2 diabetes mellitus with other circulatory complications: Secondary | ICD-10-CM | POA: Diagnosis not present

## 2016-12-08 DIAGNOSIS — I739 Peripheral vascular disease, unspecified: Secondary | ICD-10-CM | POA: Diagnosis not present

## 2016-12-13 DIAGNOSIS — I679 Cerebrovascular disease, unspecified: Secondary | ICD-10-CM | POA: Diagnosis not present

## 2016-12-13 DIAGNOSIS — F0151 Vascular dementia with behavioral disturbance: Secondary | ICD-10-CM | POA: Diagnosis not present

## 2016-12-13 DIAGNOSIS — R1310 Dysphagia, unspecified: Secondary | ICD-10-CM | POA: Diagnosis not present

## 2016-12-13 DIAGNOSIS — E1159 Type 2 diabetes mellitus with other circulatory complications: Secondary | ICD-10-CM | POA: Diagnosis not present

## 2016-12-13 DIAGNOSIS — I672 Cerebral atherosclerosis: Secondary | ICD-10-CM | POA: Diagnosis not present

## 2016-12-13 DIAGNOSIS — I739 Peripheral vascular disease, unspecified: Secondary | ICD-10-CM | POA: Diagnosis not present

## 2016-12-14 DIAGNOSIS — I679 Cerebrovascular disease, unspecified: Secondary | ICD-10-CM | POA: Diagnosis not present

## 2016-12-14 DIAGNOSIS — R1310 Dysphagia, unspecified: Secondary | ICD-10-CM | POA: Diagnosis not present

## 2016-12-14 DIAGNOSIS — F0151 Vascular dementia with behavioral disturbance: Secondary | ICD-10-CM | POA: Diagnosis not present

## 2016-12-14 DIAGNOSIS — I739 Peripheral vascular disease, unspecified: Secondary | ICD-10-CM | POA: Diagnosis not present

## 2016-12-14 DIAGNOSIS — I672 Cerebral atherosclerosis: Secondary | ICD-10-CM | POA: Diagnosis not present

## 2016-12-14 DIAGNOSIS — E1159 Type 2 diabetes mellitus with other circulatory complications: Secondary | ICD-10-CM | POA: Diagnosis not present

## 2016-12-15 DIAGNOSIS — I672 Cerebral atherosclerosis: Secondary | ICD-10-CM | POA: Diagnosis not present

## 2016-12-15 DIAGNOSIS — F0151 Vascular dementia with behavioral disturbance: Secondary | ICD-10-CM | POA: Diagnosis not present

## 2016-12-15 DIAGNOSIS — E1159 Type 2 diabetes mellitus with other circulatory complications: Secondary | ICD-10-CM | POA: Diagnosis not present

## 2016-12-15 DIAGNOSIS — I679 Cerebrovascular disease, unspecified: Secondary | ICD-10-CM | POA: Diagnosis not present

## 2016-12-15 DIAGNOSIS — R1310 Dysphagia, unspecified: Secondary | ICD-10-CM | POA: Diagnosis not present

## 2016-12-15 DIAGNOSIS — I739 Peripheral vascular disease, unspecified: Secondary | ICD-10-CM | POA: Diagnosis not present

## 2016-12-17 ENCOUNTER — Non-Acute Institutional Stay (SKILLED_NURSING_FACILITY): Payer: Medicare Other | Admitting: Adult Health

## 2016-12-17 ENCOUNTER — Encounter: Payer: Self-pay | Admitting: Adult Health

## 2016-12-17 DIAGNOSIS — F339 Major depressive disorder, recurrent, unspecified: Secondary | ICD-10-CM

## 2016-12-17 DIAGNOSIS — F39 Unspecified mood [affective] disorder: Secondary | ICD-10-CM

## 2016-12-17 DIAGNOSIS — G4700 Insomnia, unspecified: Secondary | ICD-10-CM

## 2016-12-17 DIAGNOSIS — F0150 Vascular dementia without behavioral disturbance: Secondary | ICD-10-CM

## 2016-12-17 DIAGNOSIS — N400 Enlarged prostate without lower urinary tract symptoms: Secondary | ICD-10-CM

## 2016-12-17 NOTE — Progress Notes (Signed)
DATE:  12/17/2016   MRN:  [id number]  BIRTHDAY: 1921-11-26  Facility:  Nursing Home Location:  [facility] [place] Room Number: 263-Z  LEVEL OF CARE:  SNF (31)  Contact Information    Name Relation Home Work Mobile   Caropreso,Edward Relative   [phone]   Bilger,Betsy Relative   [phone]   Jeremy Johann Daughter   [phone]   Webb,Tommy Relative   [place] Spouse [phone]         Code Status History    Date Active Date Inactive Code Status Order ID Comments User Context   07/09/2016  4:29 PM 07/12/2016  4:12 PM Full Code [id number]  Ulyses Amor, PA-C Inpatient   07/09/2016 10:54 AM 07/09/2016  4:29 PM DNR [id number]  Ulyses Amor, PA-C Inpatient   06/29/2016  5:21 PM 07/02/2016  7:18 PM DNR [id number]  Samella Parr, NP Inpatient   06/29/2016  2:26 PM 06/29/2016  5:21 PM DNR [id number]  Samella Parr, NP ED   08/05/2015  2:39 AM 08/06/2015  2:19 PM DNR [id number]  Toy Baker, MD Inpatient   07/04/2013  2:41 PM 08/05/2015  2:39 AM DNR [id number]  Hennie Duos, MD Outpatient   05/28/2013  2:30 PM 05/30/2013  8:20 PM Full Code [id number]  Ulyses Amor, PA-C Inpatient   05/21/2013 12:04 AM 05/28/2013  2:30 PM DNR [id number]  Toy Baker, MD Inpatient   07/15/2011  4:24 AM 07/19/2011  3:31 PM DNR [id number]  Kathie Dike, RN Inpatient    Advance Directive Documentation     Most Recent Value  Type of Advance Directive  Out of facility DNR (pink MOST or yellow form)  Pre-existing out of facility DNR order (yellow form or pink MOST form)  -  "MOST" Form in Place?  -       Chief Complaint  Patient presents with  . Medical Management of Chronic Issues    Routine visit    HISTORY OF PRESENT ILLNESS:  This is a Zachary male seen for a routine visit.  He is a long-term care resident at [facility].  He is also followed by [facility].  He has a PMH of CVA affecting his left side in 2015, BPH with  history of urinary retention, HTN, diabetes, PVD, and chronic leukocytosis. He was seen in today sitting on his wheelchair by the hallway and was brought to his room. His Flomax was changed to evenings since he was feeling urgency mostly in the evening. Plavix was recently discontinued. He was sent to the hospital last month due to a fall and hitting his head.  CT maxillofacial showed no fracture and soft tissue swelling with hematoma formation over the right maseter.     PAST MEDICAL HISTORY:  Past Medical History:  Diagnosis Date  . Arthritis   . Carotid artery occlusion   . GERD (gastroesophageal reflux disease)   . Headache    "sometimes monthly" (06/29/2016)  . Heart block   . History of blood transfusion    "while in the service"  . History of stomach ulcers   . Hypertension   . Insomnia   . Pancreatitis   . Prostate cancer ([place])    Archie Endo 09/02/2010  . Prostate enlargement   . PVC's (premature ventricular contractions)   . PVD (peripheral vascular disease) ([place])   . Rhabdomyolysis 06/2016  . Stroke ([place]) 05/2013; 06/29/2016   Archie Endo 05/24/2013  . Type II diabetes mellitus (  [place]) dx'd 2008   /notes 08/19/2010     CURRENT MEDICATIONS: Reviewed  Patient's Medications  New Prescriptions   No medications on file  Previous Medications   ACETAMINOPHEN (TYLENOL) 325 MG TABLET    Take 650 mg by mouth every 6 (six) hours as needed for mild pain.   BISACODYL (DULCOLAX) 10 MG SUPPOSITORY    Place 10 mg rectally daily as needed for moderate constipation (for constipation not relieved by milk of magnesium).   CLOPIDOGREL (PLAVIX) 75 MG TABLET    Take 75 mg by mouth daily.   DEXTROMETHORPHAN-GUAIFENESIN 10-100 MG/5ML LIQUID    Take 10 mLs by mouth every 6 (six) hours as needed (Cough).   DIVALPROEX (DEPAKOTE SPRINKLE) 125 MG CAPSULE    Take 125 mg by mouth 3 (three) times daily with meals.   MAGNESIUM HYDROXIDE (MILK OF MAGNESIA) 400 MG/5ML SUSPENSION    Take 30 mLs by mouth daily as needed  (for constipation).   MELATONIN 3 MG TABS    Take 1 tablet by mouth at bedtime as needed (sleep).    NUTRITIONAL SUPPLEMENT LIQD    Take 120 mLs by mouth daily.   SERTRALINE (ZOLOFT) 50 MG TABLET    Take 50 mg by mouth daily.   TAMSULOSIN HCL (FLOMAX) 0.4 MG CAPS    Take 0.4 mg by mouth daily at 6 PM.   Modified Medications   No medications on file  Discontinued Medications   No medications on file     Allergies  Allergen Reactions  . Penicillins Rash    Has patient had a PCN reaction causing immediate rash, facial/tongue/throat swelling, SOB or lightheadedness with hypotension: No Has patient had a PCN reaction causing severe rash involving mucus membranes or skin necrosis: No Has patient had a PCN reaction that required hospitalization No Has patient had a PCN reaction occurring within the last 10 years: No If all of the above answers are "NO", then may proceed with Cephalosporin use.     REVIEW OF SYSTEMS:  Unable to obtain due to dementia    PHYSICAL EXAMINATION  GENERAL APPEARANCE: Well nourished. In no acute distress.  SKIN:  Skin is warm and dry.  MOUTH and THROAT: Lips are without lesions. Oral mucosa is moist and without lesions. Tongue is normal in shape, size, and color and without lesions RESPIRATORY: breathing is even & unlabored, BS CTAB CARDIAC: RRR, no murmur,no extra heart sounds, no edema GI: abdomen soft, normal BS, no masses, no tenderness, no hepatomegaly, no splenomegaly EXTREMITIES:  Able to move X 4 extremities PSYCHIATRIC: Alert to self, disoriented to time and place. Affect and behavior are appropriate   LABS/RADIOLOGY: Labs reviewed: Basic Metabolic Panel:  Recent Labs  06/29/16 1413  07/02/16 0852 07/04/16 1546 07/09/16 0632  07/10/16 0306 07/12/16 07/28/16  NA  --   < > 141 142 140  < > 140 142 142  K  --   < > 3.7 3.7 3.9  < > 4.0 4.4 3.9  CL  --   < > 104 105 103  --  105  --   --   CO2  --   < > 26  --  27  --  29  --   --     GLUCOSE  --   < > 108* 100* 101*  --  141*  --   --   BUN  --   < > 6 17 13   --  9 14 29*  CREATININE  --   < >  0.88 1.10 1.13  --  0.94 0.9 0.9  CALCIUM  --   < > 8.8*  --  8.9  --  8.4*  --   --   MG 1.8  --   --   --   --   --   --   --   --   < > = values in this interval not displayed. Liver Function Tests:  Recent Labs  06/29/16 1132 06/30/16 0608  AST 108* 94*  ALT 30 31  ALKPHOS 60 50  BILITOT 1.5* 1.4*  PROT 6.8 5.8*  ALBUMIN 3.7 3.0*   CBC:  Recent Labs  06/29/16 1132  06/30/16 0608  07/09/16 0632  07/09/16 1201 07/10/16 0306 07/12/16  WBC 22.0*  --  14.9*  < > 12.8*  --  17.7* 12.7* 16.6  NEUTROABS 16.5*  --  9.5*  --   --   --   --   --   --   HGB 16.3  < > 14.0  < > 14.0  < > 9.7* 9.6* 10.2*  HCT 46.4  < > 40.5  < > 42.0  < > 28.8* 29.1* 30*  MCV 92.1  --  91.4  < > 93.8  --  93.2 94.8  --   PLT 277  --  219  < > 293  --  226 233 267  < > = values in this interval not displayed. Lipid Panel:  Recent Labs  06/30/16 0608  HDL 25*   Cardiac Enzymes:  Recent Labs  07/01/16 0416 07/02/16 0852 07/09/16 0632  CKTOTAL 1,243* 556* 105   CBG:  Recent Labs  07/09/16 1725 07/09/16 2127 07/10/16 0803  GLUCAP 148* 179* 121*      ASSESSMENT/PLAN:  1. Benign prostatic hyperplasia, unspecified whether lower urinary tract symptoms present - recently changed time to give Flomax in the evening   2. Insomnia, unspecified type - continue Melatonin 3 mg Q HS PRN   3. Vascular dementia without behavioral disturbance - stable, continue supportive care, fall precautions; followed up by hospice   4. Mood disorder (HCC) - mood is stable, continue Depakote sprinkle 125 mg  1 capsule TID   5. Depression, recurrent ([place]) - stable, continue Sertraline 50 mg 1 tab daily, followed-up by [facility] NP     Goals of care:  Long-term care, hospice care    Iyanla Eilers C. Alorton - NP    [employer] [phone]

## 2016-12-18 DIAGNOSIS — G20 Parkinson's disease: Secondary | ICD-10-CM | POA: Diagnosis not present

## 2016-12-18 DIAGNOSIS — I10 Essential (primary) hypertension: Secondary | ICD-10-CM | POA: Diagnosis not present

## 2016-12-18 DIAGNOSIS — I679 Cerebrovascular disease, unspecified: Secondary | ICD-10-CM | POA: Diagnosis not present

## 2016-12-18 DIAGNOSIS — E1159 Type 2 diabetes mellitus with other circulatory complications: Secondary | ICD-10-CM | POA: Diagnosis not present

## 2016-12-18 DIAGNOSIS — R1310 Dysphagia, unspecified: Secondary | ICD-10-CM | POA: Diagnosis not present

## 2016-12-18 DIAGNOSIS — F0151 Vascular dementia with behavioral disturbance: Secondary | ICD-10-CM | POA: Diagnosis not present

## 2016-12-18 DIAGNOSIS — I739 Peripheral vascular disease, unspecified: Secondary | ICD-10-CM | POA: Diagnosis not present

## 2016-12-18 DIAGNOSIS — I672 Cerebral atherosclerosis: Secondary | ICD-10-CM | POA: Diagnosis not present

## 2016-12-18 DIAGNOSIS — D638 Anemia in other chronic diseases classified elsewhere: Secondary | ICD-10-CM | POA: Diagnosis not present

## 2016-12-18 DIAGNOSIS — F339 Major depressive disorder, recurrent, unspecified: Secondary | ICD-10-CM | POA: Diagnosis not present

## 2016-12-18 DIAGNOSIS — K219 Gastro-esophageal reflux disease without esophagitis: Secondary | ICD-10-CM | POA: Diagnosis not present

## 2016-12-20 DIAGNOSIS — R1310 Dysphagia, unspecified: Secondary | ICD-10-CM | POA: Diagnosis not present

## 2016-12-20 DIAGNOSIS — I672 Cerebral atherosclerosis: Secondary | ICD-10-CM | POA: Diagnosis not present

## 2016-12-20 DIAGNOSIS — I739 Peripheral vascular disease, unspecified: Secondary | ICD-10-CM | POA: Diagnosis not present

## 2016-12-20 DIAGNOSIS — E1159 Type 2 diabetes mellitus with other circulatory complications: Secondary | ICD-10-CM | POA: Diagnosis not present

## 2016-12-20 DIAGNOSIS — I679 Cerebrovascular disease, unspecified: Secondary | ICD-10-CM | POA: Diagnosis not present

## 2016-12-20 DIAGNOSIS — F0151 Vascular dementia with behavioral disturbance: Secondary | ICD-10-CM | POA: Diagnosis not present

## 2016-12-22 DIAGNOSIS — F0151 Vascular dementia with behavioral disturbance: Secondary | ICD-10-CM | POA: Diagnosis not present

## 2016-12-22 DIAGNOSIS — R1310 Dysphagia, unspecified: Secondary | ICD-10-CM | POA: Diagnosis not present

## 2016-12-22 DIAGNOSIS — E1159 Type 2 diabetes mellitus with other circulatory complications: Secondary | ICD-10-CM | POA: Diagnosis not present

## 2016-12-22 DIAGNOSIS — I672 Cerebral atherosclerosis: Secondary | ICD-10-CM | POA: Diagnosis not present

## 2016-12-22 DIAGNOSIS — I679 Cerebrovascular disease, unspecified: Secondary | ICD-10-CM | POA: Diagnosis not present

## 2016-12-22 DIAGNOSIS — I739 Peripheral vascular disease, unspecified: Secondary | ICD-10-CM | POA: Diagnosis not present

## 2016-12-23 DIAGNOSIS — I679 Cerebrovascular disease, unspecified: Secondary | ICD-10-CM | POA: Diagnosis not present

## 2016-12-23 DIAGNOSIS — F0151 Vascular dementia with behavioral disturbance: Secondary | ICD-10-CM | POA: Diagnosis not present

## 2016-12-23 DIAGNOSIS — I739 Peripheral vascular disease, unspecified: Secondary | ICD-10-CM | POA: Diagnosis not present

## 2016-12-23 DIAGNOSIS — I672 Cerebral atherosclerosis: Secondary | ICD-10-CM | POA: Diagnosis not present

## 2016-12-23 DIAGNOSIS — R1310 Dysphagia, unspecified: Secondary | ICD-10-CM | POA: Diagnosis not present

## 2016-12-23 DIAGNOSIS — E1159 Type 2 diabetes mellitus with other circulatory complications: Secondary | ICD-10-CM | POA: Diagnosis not present

## 2016-12-27 DIAGNOSIS — F0151 Vascular dementia with behavioral disturbance: Secondary | ICD-10-CM | POA: Diagnosis not present

## 2016-12-27 DIAGNOSIS — I679 Cerebrovascular disease, unspecified: Secondary | ICD-10-CM | POA: Diagnosis not present

## 2016-12-27 DIAGNOSIS — R1310 Dysphagia, unspecified: Secondary | ICD-10-CM | POA: Diagnosis not present

## 2016-12-27 DIAGNOSIS — I739 Peripheral vascular disease, unspecified: Secondary | ICD-10-CM | POA: Diagnosis not present

## 2016-12-27 DIAGNOSIS — E1159 Type 2 diabetes mellitus with other circulatory complications: Secondary | ICD-10-CM | POA: Diagnosis not present

## 2016-12-27 DIAGNOSIS — I672 Cerebral atherosclerosis: Secondary | ICD-10-CM | POA: Diagnosis not present

## 2016-12-29 DIAGNOSIS — I739 Peripheral vascular disease, unspecified: Secondary | ICD-10-CM | POA: Diagnosis not present

## 2016-12-29 DIAGNOSIS — F0151 Vascular dementia with behavioral disturbance: Secondary | ICD-10-CM | POA: Diagnosis not present

## 2016-12-29 DIAGNOSIS — I672 Cerebral atherosclerosis: Secondary | ICD-10-CM | POA: Diagnosis not present

## 2016-12-29 DIAGNOSIS — R1310 Dysphagia, unspecified: Secondary | ICD-10-CM | POA: Diagnosis not present

## 2016-12-29 DIAGNOSIS — I679 Cerebrovascular disease, unspecified: Secondary | ICD-10-CM | POA: Diagnosis not present

## 2016-12-29 DIAGNOSIS — E1159 Type 2 diabetes mellitus with other circulatory complications: Secondary | ICD-10-CM | POA: Diagnosis not present

## 2016-12-30 ENCOUNTER — Non-Acute Institutional Stay (SKILLED_NURSING_FACILITY): Payer: Medicare Other | Admitting: Internal Medicine

## 2016-12-30 ENCOUNTER — Encounter: Payer: Self-pay | Admitting: Internal Medicine

## 2016-12-30 DIAGNOSIS — F0150 Vascular dementia without behavioral disturbance: Secondary | ICD-10-CM

## 2016-12-30 DIAGNOSIS — I672 Cerebral atherosclerosis: Secondary | ICD-10-CM | POA: Diagnosis not present

## 2016-12-30 DIAGNOSIS — G4700 Insomnia, unspecified: Secondary | ICD-10-CM

## 2016-12-30 DIAGNOSIS — I679 Cerebrovascular disease, unspecified: Secondary | ICD-10-CM | POA: Diagnosis not present

## 2016-12-30 DIAGNOSIS — E1159 Type 2 diabetes mellitus with other circulatory complications: Secondary | ICD-10-CM | POA: Diagnosis not present

## 2016-12-30 DIAGNOSIS — F0151 Vascular dementia with behavioral disturbance: Secondary | ICD-10-CM | POA: Diagnosis not present

## 2016-12-30 DIAGNOSIS — R1310 Dysphagia, unspecified: Secondary | ICD-10-CM | POA: Diagnosis not present

## 2016-12-30 DIAGNOSIS — I739 Peripheral vascular disease, unspecified: Secondary | ICD-10-CM | POA: Diagnosis not present

## 2016-12-30 NOTE — Patient Instructions (Signed)
See assessment and plan under each diagnosis in the problem list and acutely for this visit 

## 2016-12-30 NOTE — Progress Notes (Signed)
    NURSING HOME LOCATION:  [facility] ROOM NUMBER:  [id number]  CODE STATUS:  DNR  PCP:  Hendricks Limes, MD  [place] [address]  This is a nursing facility follow up for specific acute issue of sleep dysfunction manifested as staying up all night.  Interim medical record and care since last [facility] visit was updated with review of diagnostic studies and change in clinical status since last visit were documented.  HPI: Hospice had ordered melatonin but this has been ineffective in addressing his insomnia. He is described as staying up all night whistling. He does nap through the day. Additionally his daughter states that he was saying he had talked with her sister who is long deceased.  Review of systems: Dementia invalidated responses. Date given as April 21, 2018. He denies any active complaints. He does realize that he is not sleeping as well as he should  Constitutional: No fever,significant weight change, fatigue  Eyes: No redness, discharge, pain, vision change ENT/mouth: No nasal congestion,  purulent discharge, earache,change in hearing ,sore throat  Cardiovascular: No chest pain, palpitations,paroxysmal nocturnal dyspnea, claudication, edema  Respiratory: No cough, sputum production,hemoptysis, DOE , significant snoring,apnea  Gastrointestinal: No heartburn,dysphagia,abdominal pain, nausea / vomiting,rectal bleeding, melena,change in bowels Genitourinary: No dysuria,hematuria, pyuria,  incontinence, nocturia Musculoskeletal: No joint stiffness, joint swelling, weakness,pain Dermatologic: No rash, pruritus, change in appearance of skin Neurologic: No dizziness,headache,syncope, seizures, numbness , tingling Psychiatric: No significant anxiety , depression, anorexia Endocrine: No change in hair/skin/ nails, excessive thirst, excessive hunger, excessive urination  Hematologic/lymphatic: No significant bruising, lymphadenopathy,abnormal  bleeding Allergy/immunology: No itchy/ watery eyes, significant sneezing, urticaria, angioedema  Physical exam:  Pertinent or positive findings: He is well-groomed and neat. He is hard of hearing. Anisocoria is present, right pupil is bigger than the left. He has slight resting exotropia on the right. Heart rhythm is slow and irregular. Breath sounds are decreased. Abdomen is protuberant. Pedal pulses are decreased. Has decreased strength in the right upper extremity. He has scattered keratoses and freckling over the upper extremities.  General appearance:Adequately nourished; no acute distress , increased work of breathing is present.   Lymphatic: No lymphadenopathy about the head, neck, axilla . Eyes: No conjunctival inflammation or lid edema is present. There is no scleral icterus. Ears:  External ear exam shows no significant lesions or deformities.   Nose:  External nasal examination shows no deformity or inflammation. Nasal mucosa are pink and moist without lesions ,exudates Oral exam: lips and gums are healthy appearing.There is no oropharyngeal erythema or exudate . Neck:  No thyromegaly, masses, tenderness noted.    Heart:  No gallop, murmur, click, rub .  Lungs: without wheezes, rhonchi,rales , rubs. Abdomen:Bowel sounds are normal. Abdomen is soft and nontender with no organomegaly, hernias,masses. GU: deferred  Extremities:  No cyanosis, clubbing,edema  Neurologic exam : Balance,Rhomberg,finger to nose testing could not be completed due to clinical state Skin: Warm & dry w/o tenting. No significant lesions or rash.  See summary under each active problem in the Problem List with associated updated therapeutic plan

## 2016-12-30 NOTE — Assessment & Plan Note (Signed)
D/C melatonin Trial of Klonopin 0.5 mg daily at bedtime

## 2017-01-01 ENCOUNTER — Encounter: Payer: Self-pay | Admitting: Internal Medicine

## 2017-01-01 NOTE — Assessment & Plan Note (Signed)
DC melatonin?

## 2017-01-03 DIAGNOSIS — F0151 Vascular dementia with behavioral disturbance: Secondary | ICD-10-CM | POA: Diagnosis not present

## 2017-01-03 DIAGNOSIS — I672 Cerebral atherosclerosis: Secondary | ICD-10-CM | POA: Diagnosis not present

## 2017-01-03 DIAGNOSIS — I739 Peripheral vascular disease, unspecified: Secondary | ICD-10-CM | POA: Diagnosis not present

## 2017-01-03 DIAGNOSIS — I679 Cerebrovascular disease, unspecified: Secondary | ICD-10-CM | POA: Diagnosis not present

## 2017-01-03 DIAGNOSIS — R1310 Dysphagia, unspecified: Secondary | ICD-10-CM | POA: Diagnosis not present

## 2017-01-03 DIAGNOSIS — E1159 Type 2 diabetes mellitus with other circulatory complications: Secondary | ICD-10-CM | POA: Diagnosis not present

## 2017-01-05 DIAGNOSIS — F0150 Vascular dementia without behavioral disturbance: Secondary | ICD-10-CM | POA: Diagnosis not present

## 2017-01-05 DIAGNOSIS — I739 Peripheral vascular disease, unspecified: Secondary | ICD-10-CM | POA: Diagnosis not present

## 2017-01-05 DIAGNOSIS — F339 Major depressive disorder, recurrent, unspecified: Secondary | ICD-10-CM | POA: Diagnosis not present

## 2017-01-05 DIAGNOSIS — I672 Cerebral atherosclerosis: Secondary | ICD-10-CM | POA: Diagnosis not present

## 2017-01-05 DIAGNOSIS — I679 Cerebrovascular disease, unspecified: Secondary | ICD-10-CM | POA: Diagnosis not present

## 2017-01-05 DIAGNOSIS — R1310 Dysphagia, unspecified: Secondary | ICD-10-CM | POA: Diagnosis not present

## 2017-01-05 DIAGNOSIS — F0151 Vascular dementia with behavioral disturbance: Secondary | ICD-10-CM | POA: Diagnosis not present

## 2017-01-05 DIAGNOSIS — F39 Unspecified mood [affective] disorder: Secondary | ICD-10-CM | POA: Diagnosis not present

## 2017-01-05 DIAGNOSIS — E1159 Type 2 diabetes mellitus with other circulatory complications: Secondary | ICD-10-CM | POA: Diagnosis not present

## 2017-01-06 DIAGNOSIS — R1310 Dysphagia, unspecified: Secondary | ICD-10-CM | POA: Diagnosis not present

## 2017-01-06 DIAGNOSIS — I672 Cerebral atherosclerosis: Secondary | ICD-10-CM | POA: Diagnosis not present

## 2017-01-06 DIAGNOSIS — I679 Cerebrovascular disease, unspecified: Secondary | ICD-10-CM | POA: Diagnosis not present

## 2017-01-06 DIAGNOSIS — F0151 Vascular dementia with behavioral disturbance: Secondary | ICD-10-CM | POA: Diagnosis not present

## 2017-01-06 DIAGNOSIS — I739 Peripheral vascular disease, unspecified: Secondary | ICD-10-CM | POA: Diagnosis not present

## 2017-01-06 DIAGNOSIS — E1159 Type 2 diabetes mellitus with other circulatory complications: Secondary | ICD-10-CM | POA: Diagnosis not present

## 2017-01-10 ENCOUNTER — Non-Acute Institutional Stay (SKILLED_NURSING_FACILITY): Payer: Medicare Other | Admitting: Adult Health

## 2017-01-10 ENCOUNTER — Encounter: Payer: Self-pay | Admitting: Adult Health

## 2017-01-10 DIAGNOSIS — I739 Peripheral vascular disease, unspecified: Secondary | ICD-10-CM | POA: Diagnosis not present

## 2017-01-10 DIAGNOSIS — F0151 Vascular dementia with behavioral disturbance: Secondary | ICD-10-CM | POA: Diagnosis not present

## 2017-01-10 DIAGNOSIS — N400 Enlarged prostate without lower urinary tract symptoms: Secondary | ICD-10-CM

## 2017-01-10 DIAGNOSIS — I672 Cerebral atherosclerosis: Secondary | ICD-10-CM | POA: Diagnosis not present

## 2017-01-10 DIAGNOSIS — F0150 Vascular dementia without behavioral disturbance: Secondary | ICD-10-CM | POA: Diagnosis not present

## 2017-01-10 DIAGNOSIS — E119 Type 2 diabetes mellitus without complications: Secondary | ICD-10-CM

## 2017-01-10 DIAGNOSIS — R1310 Dysphagia, unspecified: Secondary | ICD-10-CM | POA: Diagnosis not present

## 2017-01-10 DIAGNOSIS — F419 Anxiety disorder, unspecified: Secondary | ICD-10-CM | POA: Diagnosis not present

## 2017-01-10 DIAGNOSIS — F339 Major depressive disorder, recurrent, unspecified: Secondary | ICD-10-CM | POA: Diagnosis not present

## 2017-01-10 DIAGNOSIS — E1159 Type 2 diabetes mellitus with other circulatory complications: Secondary | ICD-10-CM | POA: Diagnosis not present

## 2017-01-10 DIAGNOSIS — I679 Cerebrovascular disease, unspecified: Secondary | ICD-10-CM | POA: Diagnosis not present

## 2017-01-10 DIAGNOSIS — F39 Unspecified mood [affective] disorder: Secondary | ICD-10-CM | POA: Diagnosis not present

## 2017-01-10 NOTE — Progress Notes (Addendum)
DATE:  01/10/2017   MRN:  [id number]  BIRTHDAY: 05/30/1921  Facility:  Nursing Home Location:  [facility] Room Number: 354-S  LEVEL OF CARE:  SNF (31)  Contact Information    Name Relation Home Work Mobile   Caropreso,Edward Relative   [phone]   Bilger,Betsy Relative   [phone]   Jeremy Johann Daughter   [phone]   Webb,Tommy Relative   [place] Spouse [phone]         Code Status History    Date Active Date Inactive Code Status Order ID Comments User Context   07/09/2016  4:29 PM 07/12/2016  4:12 PM Full Code [id number]  Ulyses Amor, PA-C Inpatient   07/09/2016 10:54 AM 07/09/2016  4:29 PM DNR [id number]  Ulyses Amor, PA-C Inpatient   06/29/2016  5:21 PM 07/02/2016  7:18 PM DNR [id number]  Samella Parr, NP Inpatient   06/29/2016  2:26 PM 06/29/2016  5:21 PM DNR [id number]  Samella Parr, NP ED   08/05/2015  2:39 AM 08/06/2015  2:19 PM DNR [id number]  Toy Baker, MD Inpatient   07/04/2013  2:41 PM 08/05/2015  2:39 AM DNR [id number]  Hennie Duos, MD Outpatient   05/28/2013  2:30 PM 05/30/2013  8:20 PM Full Code [id number]  Ulyses Amor, PA-C Inpatient   05/21/2013 12:04 AM 05/28/2013  2:30 PM DNR [id number]  Toy Baker, MD Inpatient   07/15/2011  4:24 AM 07/19/2011  3:31 PM DNR [id number]  Kathie Dike, RN Inpatient    Advance Directive Documentation     Most Recent Value  Type of Advance Directive  Out of facility DNR (pink MOST or yellow form)  Pre-existing out of facility DNR order (yellow form or pink MOST form)  -  "MOST" Form in Place?  -       Chief Complaint  Patient presents with  . Medical Management of Chronic Issues    Routine visit    HISTORY OF PRESENT ILLNESS:  This is a 89-YO male seen for a routine visit.  He is a long-term care resident of [facility].  He is also followed by [facility].  He has a PMH of CVA in 2015 that affected his left side, BPH with  history of urinary retention, HTN, PVD, and chronic leukocytosis. He was recently seen by [place] NP and his Zoloft dosage was increased from 50 mg to 100 mg. Melatonin was, also, recently discontinued and was started on Clonazepam Q HS. He was seen in the room today.     PAST MEDICAL HISTORY:  Past Medical History:  Diagnosis Date  . Arthritis   . Carotid artery occlusion   . GERD (gastroesophageal reflux disease)   . Headache    "sometimes monthly" (06/29/2016)  . Heart block   . History of blood transfusion    "while in the service"  . History of stomach ulcers   . Hypertension   . Insomnia   . Pancreatitis   . Prostate cancer ([place])    Archie Endo 09/02/2010  . Prostate enlargement   . PVC's (premature ventricular contractions)   . PVD (peripheral vascular disease) ([place])   . Rhabdomyolysis 06/2016  . Stroke ([place]) 05/2013; 06/29/2016   Archie Endo 05/24/2013  . Type II diabetes mellitus ([place]) dx'd 2008   Archie Endo 08/19/2010     CURRENT MEDICATIONS: Reviewed  Patient's Medications  New Prescriptions   No medications on file  Previous Medications  ACETAMINOPHEN (TYLENOL) 325 MG TABLET    Take 325 mg by mouth every 6 (six) hours as needed for mild pain.    BISACODYL (DULCOLAX) 10 MG SUPPOSITORY    Place 10 mg rectally daily as needed for moderate constipation (for constipation not relieved by milk of magnesium).   CLONAZEPAM (KLONOPIN) 0.5 MG TABLET    Take 1 tablet (0.5 mg total) by mouth at bedtime.   DIVALPROEX (DEPAKOTE SPRINKLE) 125 MG CAPSULE    Take 125 mg by mouth 3 (three) times daily with meals.   EMOLLIENT (CERAVE) LOTN    Apply topically daily as needed. May keep at bedside to apply to arms and legs as needed for dry skin   MAGNESIUM HYDROXIDE (MILK OF MAGNESIA) 400 MG/5ML SUSPENSION    Take 30 mLs by mouth daily as needed (for constipation).   NUTRITIONAL SUPPLEMENT LIQD    Take 120 mLs by mouth daily. MedPass   SERTRALINE (ZOLOFT) 50 MG TABLET    Take 50 mg by mouth  daily.   TAMSULOSIN HCL (FLOMAX) 0.4 MG CAPS    Take 0.4 mg by mouth daily at 6 PM.   Modified Medications   No medications on file  Discontinued Medications   No medications on file     Allergies  Allergen Reactions  . Penicillins Rash    Has patient had a PCN reaction causing immediate rash, facial/tongue/throat swelling, SOB or lightheadedness with hypotension: No Has patient had a PCN reaction causing severe rash involving mucus membranes or skin necrosis: No Has patient had a PCN reaction that required hospitalization No Has patient had a PCN reaction occurring within the last 10 years: No If all of the above answers are "NO", then may proceed with Cephalosporin use.     REVIEW OF SYSTEMS:  Unable to obtain due to dementia    PHYSICAL EXAMINATION  GENERAL APPEARANCE: Well nourished. In no acute distress. Normal body habitus SKIN:  Skin is warm and dry.  MOUTH and THROAT: Lips are without lesions. Oral mucosa is moist and without lesions. RESPIRATORY: breathing is even & unlabored, BS CTAB CARDIAC: RRR, no murmur,no extra heart sounds, no edema GI: abdomen soft, normal BS, no masses, no tenderness, no hepatomegaly, no splenomegaly EXTREMITIES:  Able to move X 4 extremities, BLE with generalized weakness PSYCHIATRIC: Alert to self, disoriented to time and place. Affect and behavior are appropriate   LABS/RADIOLOGY: Labs reviewed: Basic Metabolic Panel:  Recent Labs  06/29/16 1413  07/02/16 0852 07/04/16 1546 07/09/16 0632  07/10/16 0306 07/12/16 07/28/16  NA  --   < > 141 142 140  < > 140 142 142  K  --   < > 3.7 3.7 3.9  < > 4.0 4.4 3.9  CL  --   < > 104 105 103  --  105  --   --   CO2  --   < > 26  --  27  --  29  --   --   GLUCOSE  --   < > 108* 100* 101*  --  141*  --   --   BUN  --   < > 6 17 13   --  9 14 29*  CREATININE  --   < > 0.88 1.10 1.13  --  0.94 0.9 0.9  CALCIUM  --   < > 8.8*  --  8.9  --  8.4*  --   --   MG 1.8  --   --   --   --   --   --    --   --   < > =  values in this interval not displayed. Liver Function Tests:  Recent Labs  06/29/16 1132 06/30/16 0608  AST 108* 94*  ALT 30 31  ALKPHOS 60 50  BILITOT 1.5* 1.4*  PROT 6.8 5.8*  ALBUMIN 3.7 3.0*   CBC:  Recent Labs  06/29/16 1132  06/30/16 0608  07/09/16 0632  07/09/16 1201 07/10/16 0306 07/12/16  WBC 22.0*  --  14.9*  < > 12.8*  --  17.7* 12.7* 16.6  NEUTROABS 16.5*  --  9.5*  --   --   --   --   --   --   HGB 16.3  < > 14.0  < > 14.0  < > 9.7* 9.6* 10.2*  HCT 46.4  < > 40.5  < > 42.0  < > 28.8* 29.1* 30*  MCV 92.1  --  91.4  < > 93.8  --  93.2 94.8  --   PLT 277  --  219  < > 293  --  226 233 267  < > = values in this interval not displayed. Lipid Panel:  Recent Labs  06/30/16 0608  HDL 25*   Cardiac Enzymes:  Recent Labs  07/01/16 0416 07/02/16 0852 07/09/16 0632  CKTOTAL 1,243* 556* 105   CBG:  Recent Labs  07/09/16 1725 07/09/16 2127 07/10/16 0803  GLUCAP 148* 179* 121*      ASSESSMENT/PLAN:  1. Benign prostatic hyperplasia, unspecified whether lower urinary tract symptoms present - stable, continue Flomax 0.4 mg 1 capsule Q evening   2. Anxiety - mood is stable, continue Clonazepam 0.5 mg Q HS   3. Depression, recurrent ([place]) - had a recent increased on Zoloft from 50 mg to 100 mg daily, followed-up by [facility]   4. Mood disorder (HCC) - mood is stable, continue Divalproex DR 125 mg 1 capsule TID   5. Vascular dementia without behavioral disturbance - continue supportive care, fall precautions, followed-up by hospice   6. Diabetes mellitus without complication ([place]) - not on any medications, check hgbA1c      Goals of care:  Long-term care/Hospice care     Kinnedy Mongiello C. Temple - NP    [employer] [phone]

## 2017-01-11 DIAGNOSIS — E119 Type 2 diabetes mellitus without complications: Secondary | ICD-10-CM | POA: Diagnosis not present

## 2017-01-11 DIAGNOSIS — E1151 Type 2 diabetes mellitus with diabetic peripheral angiopathy without gangrene: Secondary | ICD-10-CM | POA: Diagnosis not present

## 2017-01-11 DIAGNOSIS — I739 Peripheral vascular disease, unspecified: Secondary | ICD-10-CM | POA: Diagnosis not present

## 2017-01-11 DIAGNOSIS — E1159 Type 2 diabetes mellitus with other circulatory complications: Secondary | ICD-10-CM | POA: Diagnosis not present

## 2017-01-11 DIAGNOSIS — I679 Cerebrovascular disease, unspecified: Secondary | ICD-10-CM | POA: Diagnosis not present

## 2017-01-11 DIAGNOSIS — I672 Cerebral atherosclerosis: Secondary | ICD-10-CM | POA: Diagnosis not present

## 2017-01-11 DIAGNOSIS — F0151 Vascular dementia with behavioral disturbance: Secondary | ICD-10-CM | POA: Diagnosis not present

## 2017-01-11 DIAGNOSIS — R1310 Dysphagia, unspecified: Secondary | ICD-10-CM | POA: Diagnosis not present

## 2017-01-11 LAB — HEMOGLOBIN A1C: HEMOGLOBIN A1C: 6.7

## 2017-01-12 DIAGNOSIS — F0151 Vascular dementia with behavioral disturbance: Secondary | ICD-10-CM | POA: Diagnosis not present

## 2017-01-12 DIAGNOSIS — E1159 Type 2 diabetes mellitus with other circulatory complications: Secondary | ICD-10-CM | POA: Diagnosis not present

## 2017-01-12 DIAGNOSIS — I739 Peripheral vascular disease, unspecified: Secondary | ICD-10-CM | POA: Diagnosis not present

## 2017-01-12 DIAGNOSIS — I672 Cerebral atherosclerosis: Secondary | ICD-10-CM | POA: Diagnosis not present

## 2017-01-12 DIAGNOSIS — I679 Cerebrovascular disease, unspecified: Secondary | ICD-10-CM | POA: Diagnosis not present

## 2017-01-12 DIAGNOSIS — R1310 Dysphagia, unspecified: Secondary | ICD-10-CM | POA: Diagnosis not present

## 2017-01-17 DIAGNOSIS — I679 Cerebrovascular disease, unspecified: Secondary | ICD-10-CM | POA: Diagnosis not present

## 2017-01-17 DIAGNOSIS — R1310 Dysphagia, unspecified: Secondary | ICD-10-CM | POA: Diagnosis not present

## 2017-01-17 DIAGNOSIS — G20 Parkinson's disease: Secondary | ICD-10-CM | POA: Diagnosis not present

## 2017-01-17 DIAGNOSIS — I10 Essential (primary) hypertension: Secondary | ICD-10-CM | POA: Diagnosis not present

## 2017-01-17 DIAGNOSIS — F0151 Vascular dementia with behavioral disturbance: Secondary | ICD-10-CM | POA: Diagnosis not present

## 2017-01-17 DIAGNOSIS — E1159 Type 2 diabetes mellitus with other circulatory complications: Secondary | ICD-10-CM | POA: Diagnosis not present

## 2017-01-17 DIAGNOSIS — K219 Gastro-esophageal reflux disease without esophagitis: Secondary | ICD-10-CM | POA: Diagnosis not present

## 2017-01-17 DIAGNOSIS — D638 Anemia in other chronic diseases classified elsewhere: Secondary | ICD-10-CM | POA: Diagnosis not present

## 2017-01-17 DIAGNOSIS — F339 Major depressive disorder, recurrent, unspecified: Secondary | ICD-10-CM | POA: Diagnosis not present

## 2017-01-17 DIAGNOSIS — I739 Peripheral vascular disease, unspecified: Secondary | ICD-10-CM | POA: Diagnosis not present

## 2017-01-17 DIAGNOSIS — I672 Cerebral atherosclerosis: Secondary | ICD-10-CM | POA: Diagnosis not present

## 2017-01-19 DIAGNOSIS — I672 Cerebral atherosclerosis: Secondary | ICD-10-CM | POA: Diagnosis not present

## 2017-01-19 DIAGNOSIS — F39 Unspecified mood [affective] disorder: Secondary | ICD-10-CM | POA: Diagnosis not present

## 2017-01-19 DIAGNOSIS — R1310 Dysphagia, unspecified: Secondary | ICD-10-CM | POA: Diagnosis not present

## 2017-01-19 DIAGNOSIS — F0150 Vascular dementia without behavioral disturbance: Secondary | ICD-10-CM | POA: Diagnosis not present

## 2017-01-19 DIAGNOSIS — I739 Peripheral vascular disease, unspecified: Secondary | ICD-10-CM | POA: Diagnosis not present

## 2017-01-19 DIAGNOSIS — F339 Major depressive disorder, recurrent, unspecified: Secondary | ICD-10-CM | POA: Diagnosis not present

## 2017-01-19 DIAGNOSIS — F0151 Vascular dementia with behavioral disturbance: Secondary | ICD-10-CM | POA: Diagnosis not present

## 2017-01-19 DIAGNOSIS — E1159 Type 2 diabetes mellitus with other circulatory complications: Secondary | ICD-10-CM | POA: Diagnosis not present

## 2017-01-19 DIAGNOSIS — I679 Cerebrovascular disease, unspecified: Secondary | ICD-10-CM | POA: Diagnosis not present

## 2017-01-20 DIAGNOSIS — R1310 Dysphagia, unspecified: Secondary | ICD-10-CM | POA: Diagnosis not present

## 2017-01-20 DIAGNOSIS — I739 Peripheral vascular disease, unspecified: Secondary | ICD-10-CM | POA: Diagnosis not present

## 2017-01-20 DIAGNOSIS — I672 Cerebral atherosclerosis: Secondary | ICD-10-CM | POA: Diagnosis not present

## 2017-01-20 DIAGNOSIS — F0151 Vascular dementia with behavioral disturbance: Secondary | ICD-10-CM | POA: Diagnosis not present

## 2017-01-20 DIAGNOSIS — E1159 Type 2 diabetes mellitus with other circulatory complications: Secondary | ICD-10-CM | POA: Diagnosis not present

## 2017-01-20 DIAGNOSIS — I679 Cerebrovascular disease, unspecified: Secondary | ICD-10-CM | POA: Diagnosis not present

## 2017-01-24 DIAGNOSIS — E1159 Type 2 diabetes mellitus with other circulatory complications: Secondary | ICD-10-CM | POA: Diagnosis not present

## 2017-01-24 DIAGNOSIS — F0151 Vascular dementia with behavioral disturbance: Secondary | ICD-10-CM | POA: Diagnosis not present

## 2017-01-24 DIAGNOSIS — R1310 Dysphagia, unspecified: Secondary | ICD-10-CM | POA: Diagnosis not present

## 2017-01-24 DIAGNOSIS — I672 Cerebral atherosclerosis: Secondary | ICD-10-CM | POA: Diagnosis not present

## 2017-01-24 DIAGNOSIS — I679 Cerebrovascular disease, unspecified: Secondary | ICD-10-CM | POA: Diagnosis not present

## 2017-01-24 DIAGNOSIS — I739 Peripheral vascular disease, unspecified: Secondary | ICD-10-CM | POA: Diagnosis not present

## 2017-01-25 ENCOUNTER — Ambulatory Visit: Payer: Medicare Other | Admitting: Vascular Surgery

## 2017-01-25 ENCOUNTER — Ambulatory Visit (HOSPITAL_COMMUNITY): Payer: Medicare Other

## 2017-01-26 DIAGNOSIS — E1159 Type 2 diabetes mellitus with other circulatory complications: Secondary | ICD-10-CM | POA: Diagnosis not present

## 2017-01-26 DIAGNOSIS — I739 Peripheral vascular disease, unspecified: Secondary | ICD-10-CM | POA: Diagnosis not present

## 2017-01-26 DIAGNOSIS — I679 Cerebrovascular disease, unspecified: Secondary | ICD-10-CM | POA: Diagnosis not present

## 2017-01-26 DIAGNOSIS — F0151 Vascular dementia with behavioral disturbance: Secondary | ICD-10-CM | POA: Diagnosis not present

## 2017-01-26 DIAGNOSIS — R1310 Dysphagia, unspecified: Secondary | ICD-10-CM | POA: Diagnosis not present

## 2017-01-26 DIAGNOSIS — I672 Cerebral atherosclerosis: Secondary | ICD-10-CM | POA: Diagnosis not present

## 2017-01-27 DIAGNOSIS — I679 Cerebrovascular disease, unspecified: Secondary | ICD-10-CM | POA: Diagnosis not present

## 2017-01-27 DIAGNOSIS — I672 Cerebral atherosclerosis: Secondary | ICD-10-CM | POA: Diagnosis not present

## 2017-01-27 DIAGNOSIS — I739 Peripheral vascular disease, unspecified: Secondary | ICD-10-CM | POA: Diagnosis not present

## 2017-01-27 DIAGNOSIS — F0151 Vascular dementia with behavioral disturbance: Secondary | ICD-10-CM | POA: Diagnosis not present

## 2017-01-27 DIAGNOSIS — R1310 Dysphagia, unspecified: Secondary | ICD-10-CM | POA: Diagnosis not present

## 2017-01-27 DIAGNOSIS — E1159 Type 2 diabetes mellitus with other circulatory complications: Secondary | ICD-10-CM | POA: Diagnosis not present

## 2017-01-31 DIAGNOSIS — I672 Cerebral atherosclerosis: Secondary | ICD-10-CM | POA: Diagnosis not present

## 2017-01-31 DIAGNOSIS — F0151 Vascular dementia with behavioral disturbance: Secondary | ICD-10-CM | POA: Diagnosis not present

## 2017-01-31 DIAGNOSIS — I739 Peripheral vascular disease, unspecified: Secondary | ICD-10-CM | POA: Diagnosis not present

## 2017-01-31 DIAGNOSIS — R1310 Dysphagia, unspecified: Secondary | ICD-10-CM | POA: Diagnosis not present

## 2017-01-31 DIAGNOSIS — I679 Cerebrovascular disease, unspecified: Secondary | ICD-10-CM | POA: Diagnosis not present

## 2017-01-31 DIAGNOSIS — E1159 Type 2 diabetes mellitus with other circulatory complications: Secondary | ICD-10-CM | POA: Diagnosis not present

## 2017-02-02 DIAGNOSIS — R1310 Dysphagia, unspecified: Secondary | ICD-10-CM | POA: Diagnosis not present

## 2017-02-02 DIAGNOSIS — E1159 Type 2 diabetes mellitus with other circulatory complications: Secondary | ICD-10-CM | POA: Diagnosis not present

## 2017-02-02 DIAGNOSIS — I739 Peripheral vascular disease, unspecified: Secondary | ICD-10-CM | POA: Diagnosis not present

## 2017-02-02 DIAGNOSIS — I672 Cerebral atherosclerosis: Secondary | ICD-10-CM | POA: Diagnosis not present

## 2017-02-02 DIAGNOSIS — I679 Cerebrovascular disease, unspecified: Secondary | ICD-10-CM | POA: Diagnosis not present

## 2017-02-02 DIAGNOSIS — F0151 Vascular dementia with behavioral disturbance: Secondary | ICD-10-CM | POA: Diagnosis not present

## 2017-02-03 DIAGNOSIS — I672 Cerebral atherosclerosis: Secondary | ICD-10-CM | POA: Diagnosis not present

## 2017-02-03 DIAGNOSIS — I679 Cerebrovascular disease, unspecified: Secondary | ICD-10-CM | POA: Diagnosis not present

## 2017-02-03 DIAGNOSIS — I739 Peripheral vascular disease, unspecified: Secondary | ICD-10-CM | POA: Diagnosis not present

## 2017-02-03 DIAGNOSIS — R1310 Dysphagia, unspecified: Secondary | ICD-10-CM | POA: Diagnosis not present

## 2017-02-03 DIAGNOSIS — F0151 Vascular dementia with behavioral disturbance: Secondary | ICD-10-CM | POA: Diagnosis not present

## 2017-02-03 DIAGNOSIS — E1159 Type 2 diabetes mellitus with other circulatory complications: Secondary | ICD-10-CM | POA: Diagnosis not present

## 2017-02-04 ENCOUNTER — Encounter: Payer: Self-pay | Admitting: Adult Health

## 2017-02-04 ENCOUNTER — Non-Acute Institutional Stay (SKILLED_NURSING_FACILITY): Payer: Medicare Other | Admitting: Adult Health

## 2017-02-04 DIAGNOSIS — F39 Unspecified mood [affective] disorder: Secondary | ICD-10-CM

## 2017-02-04 DIAGNOSIS — F0150 Vascular dementia without behavioral disturbance: Secondary | ICD-10-CM

## 2017-02-04 DIAGNOSIS — F419 Anxiety disorder, unspecified: Secondary | ICD-10-CM | POA: Diagnosis not present

## 2017-02-04 DIAGNOSIS — R21 Rash and other nonspecific skin eruption: Secondary | ICD-10-CM

## 2017-02-04 DIAGNOSIS — N400 Enlarged prostate without lower urinary tract symptoms: Secondary | ICD-10-CM | POA: Diagnosis not present

## 2017-02-04 NOTE — Progress Notes (Signed)
DATE:  02/04/2017   MRN:  [id number]  BIRTHDAY: 24-Feb-1922  Facility:  Nursing Home Location:  [facility] Room Number: 412-I  LEVEL OF CARE:  SNF (31)  Contact Information    Name Relation Home Work Mobile   Caropreso,Edward Relative   [phone]   Bilger,Betsy Relative   [phone]   Jeremy Johann Daughter   [phone]   Webb,Tommy Relative   [place] Spouse [phone]         Code Status History    Date Active Date Inactive Code Status Order ID Comments User Context   07/09/2016  4:29 PM 07/12/2016  4:12 PM Full Code [id number]  Ulyses Amor, PA-C Inpatient   07/09/2016 10:54 AM 07/09/2016  4:29 PM DNR [id number]  Ulyses Amor, PA-C Inpatient   06/29/2016  5:21 PM 07/02/2016  7:18 PM DNR [id number]  Samella Parr, NP Inpatient   06/29/2016  2:26 PM 06/29/2016  5:21 PM DNR [id number]  Samella Parr, NP ED   08/05/2015  2:39 AM 08/06/2015  2:19 PM DNR [id number]  Toy Baker, MD Inpatient   07/04/2013  2:41 PM 08/05/2015  2:39 AM DNR [id number]  Hennie Duos, MD Outpatient   05/28/2013  2:30 PM 05/30/2013  8:20 PM Full Code [id number]  Ulyses Amor, PA-C Inpatient   05/21/2013 12:04 AM 05/28/2013  2:30 PM DNR [id number]  Toy Baker, MD Inpatient   07/15/2011  4:24 AM 07/19/2011  3:31 PM DNR [id number]  Kathie Dike, RN Inpatient    Advance Directive Documentation     Most Recent Value  Type of Advance Directive  Out of facility DNR (pink MOST or yellow form)  Pre-existing out of facility DNR order (yellow form or pink MOST form)  -  "MOST" Form in Place?  -       Chief Complaint  Patient presents with  . Medical Management of Chronic Issues    Routine visit    HISTORY OF PRESENT ILLNESS:  This is a 89-YO male seen for a routine visit.  He is a long-term care resident of [facility].  He has a PMH of HTN, urinary retention, PVD, and chronic leukocytosis. He was seen in the room  today. He was recently started on Triamcinolone  Ointment for rashes on his back. Noted rashes on back to less. He did not complain of pruritus today. He is comfort care/hospice and family does not want labs done.     PAST MEDICAL HISTORY:  Past Medical History:  Diagnosis Date  . Arthritis   . Carotid artery occlusion   . GERD (gastroesophageal reflux disease)   . Headache    "sometimes monthly" (06/29/2016)  . Heart block   . History of blood transfusion    "while in the service"  . History of stomach ulcers   . Hypertension   . Insomnia   . Pancreatitis   . Prostate cancer ([place])    Archie Endo 09/02/2010  . Prostate enlargement   . PVC's (premature ventricular contractions)   . PVD (peripheral vascular disease) ([place])   . Rhabdomyolysis 06/2016  . Stroke ([place]) 05/2013; 06/29/2016   Archie Endo 05/24/2013  . Type II diabetes mellitus ([place]) dx'd 2008   Archie Endo 08/19/2010     CURRENT MEDICATIONS: Reviewed  Patient's Medications  New Prescriptions   No medications on file  Previous Medications   ACETAMINOPHEN (TYLENOL) 325 MG TABLET    Take 325 mg by mouth every  6 (six) hours as needed for mild pain.    BISACODYL (DULCOLAX) 10 MG SUPPOSITORY    Place 10 mg rectally daily as needed for moderate constipation (for constipation not relieved by milk of magnesium).   CLONAZEPAM (KLONOPIN) 0.5 MG TABLET    Take 1 tablet (0.5 mg total) by mouth at bedtime.   DIVALPROEX (DEPAKOTE SPRINKLE) 125 MG CAPSULE    Take 125 mg by mouth daily. Take at noon.   EMOLLIENT (CERAVE) LOTN    Apply topically daily as needed. May keep at bedside to apply to arms and legs as needed for dry skin   MAGNESIUM HYDROXIDE (MILK OF MAGNESIA) 400 MG/5ML SUSPENSION    Take 30 mLs by mouth daily as needed (for constipation).   NUTRITIONAL SUPPLEMENT LIQD    Take 120 mLs by mouth daily. MedPass   TAMSULOSIN HCL (FLOMAX) 0.4 MG CAPS    Take 0.4 mg by mouth daily at 6 PM.    TRIAMCINOLONE (KENALOG) 0.025 % OINTMENT    Apply 1  application topically 2 (two) times daily. Apply to rash on back x2 weeks  Modified Medications   No medications on file  Discontinued Medications   SERTRALINE (ZOLOFT) 50 MG TABLET    Take 50 mg by mouth daily.     Allergies  Allergen Reactions  . Penicillins Rash    Has patient had a PCN reaction causing immediate rash, facial/tongue/throat swelling, SOB or lightheadedness with hypotension: No Has patient had a PCN reaction causing severe rash involving mucus membranes or skin necrosis: No Has patient had a PCN reaction that required hospitalization No Has patient had a PCN reaction occurring within the last 10 years: No If all of the above answers are "NO", then may proceed with Cephalosporin use.     REVIEW OF SYSTEMS:  Unable to obtain due to dementia   PHYSICAL EXAMINATION  GENERAL APPEARANCE: Well nourished. In no acute distress. SKIN:  Flat erythematous rashes on back MOUTH and THROAT: Lips are without lesions. Oral mucosa is moist and without lesions. RESPIRATORY: breathing is even & unlabored, BS CTAB CARDIAC: RRR, no murmur,no extra heart sounds, no edema GI: abdomen soft, normal BS, no masses, no tenderness, no hepatomegaly, no splenomegaly EXTREMITIES:  Able to move X 4 extremities, BLE with generalized weakness PSYCHIATRIC: Alert to self, disoriented to time and place Affect and behavior are appropriate   LABS/RADIOLOGY: Labs reviewed: Basic Metabolic Panel:  Recent Labs  06/29/16 1413  07/02/16 0852 07/04/16 1546 07/09/16 0632  07/10/16 0306 07/12/16 07/28/16  NA  --   < > 141 142 140  < > 140 142 142  K  --   < > 3.7 3.7 3.9  < > 4.0 4.4 3.9  CL  --   < > 104 105 103  --  105  --   --   CO2  --   < > 26  --  27  --  29  --   --   GLUCOSE  --   < > 108* 100* 101*  --  141*  --   --   BUN  --   < > 6 17 13   --  9 14 29*  CREATININE  --   < > 0.88 1.10 1.13  --  0.94 0.9 0.9  CALCIUM  --   < > 8.8*  --  8.9  --  8.4*  --   --   MG 1.8  --   --   --    --   --   --   --   --   < > =  values in this interval not displayed. Liver Function Tests:  Recent Labs  06/29/16 1132 06/30/16 0608  AST 108* 94*  ALT 30 31  ALKPHOS 60 50  BILITOT 1.5* 1.4*  PROT 6.8 5.8*  ALBUMIN 3.7 3.0*   CBC:  Recent Labs  06/29/16 1132  06/30/16 0608  07/09/16 0632  07/09/16 1201 07/10/16 0306 07/12/16  WBC 22.0*  --  14.9*  < > 12.8*  --  17.7* 12.7* 16.6  NEUTROABS 16.5*  --  9.5*  --   --   --   --   --   --   HGB 16.3  < > 14.0  < > 14.0  < > 9.7* 9.6* 10.2*  HCT 46.4  < > 40.5  < > 42.0  < > 28.8* 29.1* 30*  MCV 92.1  --  91.4  < > 93.8  --  93.2 94.8  --   PLT 277  --  219  < > 293  --  226 233 267  < > = values in this interval not displayed. Lipid Panel:  Recent Labs  06/30/16 0608  HDL 25*   Cardiac Enzymes:  Recent Labs  07/01/16 0416 07/02/16 0852 07/09/16 0632  CKTOTAL 1,243* 556* 105   CBG:  Recent Labs  07/09/16 1725 07/09/16 2127 07/10/16 0803  GLUCAP 148* 179* 121*      ASSESSMENT/PLAN:  1. Rash of back - rashes are less now , continue triamcinolone 0.025% ointment apply topically to rashes on back twice a day times 2 weeks, monitor for signs of infection   2. Benign prostatic hyperplasia, unspecified whether lower urinary tract symptoms present - continue tamsulosin 0.4 mg 1 capsule q. evening   3. Mood disorder (HCC) - mood is stable, continue Depakote DR 125 mg 1 capsule q. a.m. and 12 noon continue supportive care and fall precautions,   4. Vascular dementia without behavioral disturbance -he is currently followed up by hospice family has requested for him not to have any more labs   5. Anxiety - mood is stable, continue clonazepam 0.5 mg 1 tab nightly     Goals of care:  Long-term care/Hospice care    Salinda Snedeker C. Regal - NP  [employer] [phone]

## 2017-02-07 DIAGNOSIS — I679 Cerebrovascular disease, unspecified: Secondary | ICD-10-CM | POA: Diagnosis not present

## 2017-02-07 DIAGNOSIS — R1310 Dysphagia, unspecified: Secondary | ICD-10-CM | POA: Diagnosis not present

## 2017-02-07 DIAGNOSIS — E1159 Type 2 diabetes mellitus with other circulatory complications: Secondary | ICD-10-CM | POA: Diagnosis not present

## 2017-02-07 DIAGNOSIS — F0151 Vascular dementia with behavioral disturbance: Secondary | ICD-10-CM | POA: Diagnosis not present

## 2017-02-07 DIAGNOSIS — I739 Peripheral vascular disease, unspecified: Secondary | ICD-10-CM | POA: Diagnosis not present

## 2017-02-07 DIAGNOSIS — I672 Cerebral atherosclerosis: Secondary | ICD-10-CM | POA: Diagnosis not present

## 2017-02-09 DIAGNOSIS — I679 Cerebrovascular disease, unspecified: Secondary | ICD-10-CM | POA: Diagnosis not present

## 2017-02-09 DIAGNOSIS — F0151 Vascular dementia with behavioral disturbance: Secondary | ICD-10-CM | POA: Diagnosis not present

## 2017-02-09 DIAGNOSIS — I672 Cerebral atherosclerosis: Secondary | ICD-10-CM | POA: Diagnosis not present

## 2017-02-09 DIAGNOSIS — I739 Peripheral vascular disease, unspecified: Secondary | ICD-10-CM | POA: Diagnosis not present

## 2017-02-09 DIAGNOSIS — E1159 Type 2 diabetes mellitus with other circulatory complications: Secondary | ICD-10-CM | POA: Diagnosis not present

## 2017-02-09 DIAGNOSIS — R1310 Dysphagia, unspecified: Secondary | ICD-10-CM | POA: Diagnosis not present

## 2017-02-10 DIAGNOSIS — I739 Peripheral vascular disease, unspecified: Secondary | ICD-10-CM | POA: Diagnosis not present

## 2017-02-10 DIAGNOSIS — I672 Cerebral atherosclerosis: Secondary | ICD-10-CM | POA: Diagnosis not present

## 2017-02-10 DIAGNOSIS — I679 Cerebrovascular disease, unspecified: Secondary | ICD-10-CM | POA: Diagnosis not present

## 2017-02-10 DIAGNOSIS — F0151 Vascular dementia with behavioral disturbance: Secondary | ICD-10-CM | POA: Diagnosis not present

## 2017-02-10 DIAGNOSIS — E1159 Type 2 diabetes mellitus with other circulatory complications: Secondary | ICD-10-CM | POA: Diagnosis not present

## 2017-02-10 DIAGNOSIS — R1310 Dysphagia, unspecified: Secondary | ICD-10-CM | POA: Diagnosis not present

## 2017-02-14 DIAGNOSIS — I672 Cerebral atherosclerosis: Secondary | ICD-10-CM | POA: Diagnosis not present

## 2017-02-14 DIAGNOSIS — R1310 Dysphagia, unspecified: Secondary | ICD-10-CM | POA: Diagnosis not present

## 2017-02-14 DIAGNOSIS — E1159 Type 2 diabetes mellitus with other circulatory complications: Secondary | ICD-10-CM | POA: Diagnosis not present

## 2017-02-14 DIAGNOSIS — I739 Peripheral vascular disease, unspecified: Secondary | ICD-10-CM | POA: Diagnosis not present

## 2017-02-14 DIAGNOSIS — F0151 Vascular dementia with behavioral disturbance: Secondary | ICD-10-CM | POA: Diagnosis not present

## 2017-02-14 DIAGNOSIS — I679 Cerebrovascular disease, unspecified: Secondary | ICD-10-CM | POA: Diagnosis not present

## 2017-02-15 ENCOUNTER — Other Ambulatory Visit: Payer: Self-pay

## 2017-02-15 MED ORDER — CLONAZEPAM 0.5 MG PO TABS: 0.5000 mg | ORAL_TABLET | Freq: Every evening | ORAL | 0 refills | Status: DC | PRN

## 2017-02-15 NOTE — Telephone Encounter (Signed)
Order faxed to [facility] [phone]  [place] checked.

## 2017-02-16 DIAGNOSIS — I672 Cerebral atherosclerosis: Secondary | ICD-10-CM | POA: Diagnosis not present

## 2017-02-16 DIAGNOSIS — E1159 Type 2 diabetes mellitus with other circulatory complications: Secondary | ICD-10-CM | POA: Diagnosis not present

## 2017-02-16 DIAGNOSIS — F0151 Vascular dementia with behavioral disturbance: Secondary | ICD-10-CM | POA: Diagnosis not present

## 2017-02-16 DIAGNOSIS — I739 Peripheral vascular disease, unspecified: Secondary | ICD-10-CM | POA: Diagnosis not present

## 2017-02-16 DIAGNOSIS — R1310 Dysphagia, unspecified: Secondary | ICD-10-CM | POA: Diagnosis not present

## 2017-02-16 DIAGNOSIS — I679 Cerebrovascular disease, unspecified: Secondary | ICD-10-CM | POA: Diagnosis not present

## 2017-02-17 DIAGNOSIS — D638 Anemia in other chronic diseases classified elsewhere: Secondary | ICD-10-CM | POA: Diagnosis not present

## 2017-02-17 DIAGNOSIS — I672 Cerebral atherosclerosis: Secondary | ICD-10-CM | POA: Diagnosis not present

## 2017-02-17 DIAGNOSIS — N401 Enlarged prostate with lower urinary tract symptoms: Secondary | ICD-10-CM | POA: Diagnosis not present

## 2017-02-17 DIAGNOSIS — R1310 Dysphagia, unspecified: Secondary | ICD-10-CM | POA: Diagnosis not present

## 2017-02-17 DIAGNOSIS — F339 Major depressive disorder, recurrent, unspecified: Secondary | ICD-10-CM | POA: Diagnosis not present

## 2017-02-17 DIAGNOSIS — K219 Gastro-esophageal reflux disease without esophagitis: Secondary | ICD-10-CM | POA: Diagnosis not present

## 2017-02-17 DIAGNOSIS — F0151 Vascular dementia with behavioral disturbance: Secondary | ICD-10-CM | POA: Diagnosis not present

## 2017-02-17 DIAGNOSIS — I10 Essential (primary) hypertension: Secondary | ICD-10-CM | POA: Diagnosis not present

## 2017-02-17 DIAGNOSIS — I739 Peripheral vascular disease, unspecified: Secondary | ICD-10-CM | POA: Diagnosis not present

## 2017-02-17 DIAGNOSIS — I679 Cerebrovascular disease, unspecified: Secondary | ICD-10-CM | POA: Diagnosis not present

## 2017-02-17 DIAGNOSIS — G20 Parkinson's disease: Secondary | ICD-10-CM | POA: Diagnosis not present

## 2017-02-17 DIAGNOSIS — E1159 Type 2 diabetes mellitus with other circulatory complications: Secondary | ICD-10-CM | POA: Diagnosis not present

## 2017-02-21 DIAGNOSIS — I672 Cerebral atherosclerosis: Secondary | ICD-10-CM | POA: Diagnosis not present

## 2017-02-21 DIAGNOSIS — R1310 Dysphagia, unspecified: Secondary | ICD-10-CM | POA: Diagnosis not present

## 2017-02-21 DIAGNOSIS — I739 Peripheral vascular disease, unspecified: Secondary | ICD-10-CM | POA: Diagnosis not present

## 2017-02-21 DIAGNOSIS — E1159 Type 2 diabetes mellitus with other circulatory complications: Secondary | ICD-10-CM | POA: Diagnosis not present

## 2017-02-21 DIAGNOSIS — F0151 Vascular dementia with behavioral disturbance: Secondary | ICD-10-CM | POA: Diagnosis not present

## 2017-02-21 DIAGNOSIS — I679 Cerebrovascular disease, unspecified: Secondary | ICD-10-CM | POA: Diagnosis not present

## 2017-02-23 DIAGNOSIS — E1159 Type 2 diabetes mellitus with other circulatory complications: Secondary | ICD-10-CM | POA: Diagnosis not present

## 2017-02-23 DIAGNOSIS — R1310 Dysphagia, unspecified: Secondary | ICD-10-CM | POA: Diagnosis not present

## 2017-02-23 DIAGNOSIS — F0151 Vascular dementia with behavioral disturbance: Secondary | ICD-10-CM | POA: Diagnosis not present

## 2017-02-23 DIAGNOSIS — I679 Cerebrovascular disease, unspecified: Secondary | ICD-10-CM | POA: Diagnosis not present

## 2017-02-23 DIAGNOSIS — I739 Peripheral vascular disease, unspecified: Secondary | ICD-10-CM | POA: Diagnosis not present

## 2017-02-23 DIAGNOSIS — I672 Cerebral atherosclerosis: Secondary | ICD-10-CM | POA: Diagnosis not present

## 2017-02-24 DIAGNOSIS — E1159 Type 2 diabetes mellitus with other circulatory complications: Secondary | ICD-10-CM | POA: Diagnosis not present

## 2017-02-24 DIAGNOSIS — I679 Cerebrovascular disease, unspecified: Secondary | ICD-10-CM | POA: Diagnosis not present

## 2017-02-24 DIAGNOSIS — I672 Cerebral atherosclerosis: Secondary | ICD-10-CM | POA: Diagnosis not present

## 2017-02-24 DIAGNOSIS — I739 Peripheral vascular disease, unspecified: Secondary | ICD-10-CM | POA: Diagnosis not present

## 2017-02-24 DIAGNOSIS — R1310 Dysphagia, unspecified: Secondary | ICD-10-CM | POA: Diagnosis not present

## 2017-02-24 DIAGNOSIS — F0151 Vascular dementia with behavioral disturbance: Secondary | ICD-10-CM | POA: Diagnosis not present

## 2017-02-28 DIAGNOSIS — I679 Cerebrovascular disease, unspecified: Secondary | ICD-10-CM | POA: Diagnosis not present

## 2017-02-28 DIAGNOSIS — E1159 Type 2 diabetes mellitus with other circulatory complications: Secondary | ICD-10-CM | POA: Diagnosis not present

## 2017-02-28 DIAGNOSIS — I672 Cerebral atherosclerosis: Secondary | ICD-10-CM | POA: Diagnosis not present

## 2017-02-28 DIAGNOSIS — F0151 Vascular dementia with behavioral disturbance: Secondary | ICD-10-CM | POA: Diagnosis not present

## 2017-02-28 DIAGNOSIS — R1310 Dysphagia, unspecified: Secondary | ICD-10-CM | POA: Diagnosis not present

## 2017-02-28 DIAGNOSIS — I739 Peripheral vascular disease, unspecified: Secondary | ICD-10-CM | POA: Diagnosis not present

## 2017-03-01 ENCOUNTER — Encounter (HOSPITAL_COMMUNITY): Payer: Medicare Other

## 2017-03-01 ENCOUNTER — Ambulatory Visit: Payer: Medicare Other | Admitting: Family

## 2017-03-02 DIAGNOSIS — I672 Cerebral atherosclerosis: Secondary | ICD-10-CM | POA: Diagnosis not present

## 2017-03-02 DIAGNOSIS — I679 Cerebrovascular disease, unspecified: Secondary | ICD-10-CM | POA: Diagnosis not present

## 2017-03-02 DIAGNOSIS — F0151 Vascular dementia with behavioral disturbance: Secondary | ICD-10-CM | POA: Diagnosis not present

## 2017-03-02 DIAGNOSIS — R1310 Dysphagia, unspecified: Secondary | ICD-10-CM | POA: Diagnosis not present

## 2017-03-02 DIAGNOSIS — I739 Peripheral vascular disease, unspecified: Secondary | ICD-10-CM | POA: Diagnosis not present

## 2017-03-02 DIAGNOSIS — E1159 Type 2 diabetes mellitus with other circulatory complications: Secondary | ICD-10-CM | POA: Diagnosis not present

## 2017-03-04 DIAGNOSIS — I739 Peripheral vascular disease, unspecified: Secondary | ICD-10-CM | POA: Diagnosis not present

## 2017-03-04 DIAGNOSIS — L603 Nail dystrophy: Secondary | ICD-10-CM | POA: Diagnosis not present

## 2017-03-04 DIAGNOSIS — B351 Tinea unguium: Secondary | ICD-10-CM | POA: Diagnosis not present

## 2017-03-07 DIAGNOSIS — R1310 Dysphagia, unspecified: Secondary | ICD-10-CM | POA: Diagnosis not present

## 2017-03-07 DIAGNOSIS — E1159 Type 2 diabetes mellitus with other circulatory complications: Secondary | ICD-10-CM | POA: Diagnosis not present

## 2017-03-07 DIAGNOSIS — F0151 Vascular dementia with behavioral disturbance: Secondary | ICD-10-CM | POA: Diagnosis not present

## 2017-03-07 DIAGNOSIS — I739 Peripheral vascular disease, unspecified: Secondary | ICD-10-CM | POA: Diagnosis not present

## 2017-03-07 DIAGNOSIS — I679 Cerebrovascular disease, unspecified: Secondary | ICD-10-CM | POA: Diagnosis not present

## 2017-03-07 DIAGNOSIS — I672 Cerebral atherosclerosis: Secondary | ICD-10-CM | POA: Diagnosis not present

## 2017-03-09 ENCOUNTER — Encounter: Payer: Self-pay | Admitting: Adult Health

## 2017-03-09 ENCOUNTER — Non-Acute Institutional Stay (SKILLED_NURSING_FACILITY): Payer: Medicare Other | Admitting: Adult Health

## 2017-03-09 DIAGNOSIS — I672 Cerebral atherosclerosis: Secondary | ICD-10-CM | POA: Diagnosis not present

## 2017-03-09 DIAGNOSIS — F419 Anxiety disorder, unspecified: Secondary | ICD-10-CM | POA: Diagnosis not present

## 2017-03-09 DIAGNOSIS — F0150 Vascular dementia without behavioral disturbance: Secondary | ICD-10-CM

## 2017-03-09 DIAGNOSIS — N400 Enlarged prostate without lower urinary tract symptoms: Secondary | ICD-10-CM | POA: Diagnosis not present

## 2017-03-09 DIAGNOSIS — E1159 Type 2 diabetes mellitus with other circulatory complications: Secondary | ICD-10-CM | POA: Diagnosis not present

## 2017-03-09 DIAGNOSIS — R1310 Dysphagia, unspecified: Secondary | ICD-10-CM | POA: Diagnosis not present

## 2017-03-09 DIAGNOSIS — R21 Rash and other nonspecific skin eruption: Secondary | ICD-10-CM

## 2017-03-09 DIAGNOSIS — I739 Peripheral vascular disease, unspecified: Secondary | ICD-10-CM | POA: Diagnosis not present

## 2017-03-09 DIAGNOSIS — F0151 Vascular dementia with behavioral disturbance: Secondary | ICD-10-CM | POA: Diagnosis not present

## 2017-03-09 DIAGNOSIS — I679 Cerebrovascular disease, unspecified: Secondary | ICD-10-CM | POA: Diagnosis not present

## 2017-03-09 DIAGNOSIS — F39 Unspecified mood [affective] disorder: Secondary | ICD-10-CM | POA: Diagnosis not present

## 2017-03-09 NOTE — Progress Notes (Signed)
DATE:  03/09/2017   MRN:  [id number]  BIRTHDAY: November 11, 1921  Facility:  Nursing Home Location:  [place] Room Number: [place] CARE:  SNF (31)  Contact Information    Name Relation Home Work Mobile   Caropreso,Edward Relative   [phone]   Bilger,Betsy Relative   [phone]   Jeremy Johann Daughter   [phone]   Webb,Tommy Relative   [place] Spouse [phone]         Code Status History    Date Active Date Inactive Code Status Order ID Comments User Context   07/09/2016 16:29 07/12/2016 16:12 Full Code [id number]  Orbie Hurst Inpatient   07/09/2016 10:54 07/09/2016 16:29 DNR [id number]  Ulyses Amor, PA-C Inpatient   06/29/2016 17:21 07/02/2016 19:18 DNR [id number]  Samella Parr, NP Inpatient   06/29/2016 14:26 06/29/2016 17:21 DNR [id number]  Samella Parr, NP ED   08/05/2015 02:39 08/06/2015 14:19 DNR [id number]  Toy Baker, MD Inpatient   07/04/2013 14:41 08/05/2015 02:39 DNR [id number]  Hennie Duos, MD Outpatient   05/28/2013 14:30 05/30/2013 20:20 Full Code [id number]  Ulyses Amor, PA-C Inpatient   05/21/2013 00:04 05/28/2013 14:30 DNR [id number]  Toy Baker, MD Inpatient   07/15/2011 04:24 07/19/2011 15:31 DNR [id number]  Kathie Dike, RN Inpatient       Chief Complaint  Patient presents with  . Medical Management of Chronic Issues    Routine [facility] visit.  He is also followed by [facility]    HISTORY OF PRESENT ILLNESS:  This is a 89-YO male seen for a routine SNF visit.  He is a long-term care resident of [facility].  He is also followed by [facility].  He has a PMH of HTN, urinary retention, PVD, and chronic leukocytosis. He was seen today in his room. Noted that he continues to have rashes on his back. He currently has order for Triamcinolone ointment which started on Mar 15, 2023. His wife recently died and he would sometimes  be heard calling the wife in the afternoon while he is on bed.    PAST MEDICAL HISTORY:  Past Medical History:  Diagnosis Date  . Arthritis   . Carotid artery occlusion   . GERD (gastroesophageal reflux disease)   . Headache    "sometimes monthly" (06/29/2016)  . Heart block   . History of blood transfusion    "while in the service"  . History of stomach ulcers   . Hypertension   . Insomnia   . Pancreatitis   . Prostate cancer ([place])    Archie Endo 09/02/2010  . Prostate enlargement   . PVC's (premature ventricular contractions)   . PVD (peripheral vascular disease) ([place])   . Rhabdomyolysis 06/2016  . Stroke ([place]) 05/2013; 06/29/2016   Archie Endo 05/24/2013  . Type II diabetes mellitus ([place]) dx'd 2008   /notes 08/19/2010     CURRENT MEDICATIONS: Reviewed    Medication List        Accurate as of 03/09/17 11:23 AM. Always use your most recent med list.          bisacodyl 10 MG suppository Commonly known as:  DULCOLAX   CERAVE Lotn   clonazePAM 0.5 MG tablet Commonly known as:  KLONOPIN Take 1 tablet (0.5 mg total) by mouth at bedtime as needed for anxiety.   * divalproex 125 MG capsule Commonly known as:  DEPAKOTE SPRINKLE   * divalproex  125 MG capsule Commonly known as:  DEPAKOTE SPRINKLE   magnesium hydroxide 400 MG/5ML suspension Commonly known as:  MILK OF MAGNESIA   NUTRITIONAL SUPPLEMENT Liqd   tamsulosin 0.4 MG Caps capsule Commonly known as:  FLOMAX   triamcinolone 0.025 % ointment Commonly known as:  KENALOG   TYLENOL 325 MG tablet Generic drug:  acetaminophen      * This list has 2 medication(s) that are the same as other medications prescribed for you. Read the directions carefully, and ask your doctor or other care provider to review them with you.           Allergies  Allergen Reactions  . Penicillins Rash    Has patient had a PCN reaction causing immediate rash, facial/tongue/throat swelling, SOB or lightheadedness with hypotension: No Has  patient had a PCN reaction causing severe rash involving mucus membranes or skin necrosis: No Has patient had a PCN reaction that required hospitalization No Has patient had a PCN reaction occurring within the last 10 years: No If all of the above answers are "NO", then may proceed with Cephalosporin use.     REVIEW OF SYSTEMS:  Unable to obtain due to dementia    PHYSICAL EXAMINATION  GENERAL APPEARANCE: Well nourished. In no acute distress. Normal body habitus SKIN:  Erythematous macules  on back  MOUTH and THROAT: Lips are without lesions. Oral mucosa is moist and without lesions.  RESPIRATORY: breathing is even & unlabored, BS CTAB CARDIAC: RRR, no murmur,no extra heart sounds, no edema GI: abdomen soft, normal BS, no masses, no tenderness EXTREMITIES:  Able to move X 4 extremities PSYCHIATRIC: Alert to self, disoriented to time and place. Affect and behavior are appropriate   LABS/RADIOLOGY: Labs reviewed: Basic Metabolic Panel: Recent Labs    06/29/16 1413  07/02/16 0852 07/04/16 1546 07/09/16 0632  07/10/16 0306 07/12/16 07/28/16  NA  --    < > 141 142 140   < > 140 142 142  K  --    < > 3.7 3.7 3.9   < > 4.0 4.4 3.9  CL  --    < > 104 105 103  --  105  --   --   CO2  --    < > 26  --  27  --  29  --   --   GLUCOSE  --    < > 108* 100* 101*  --  141*  --   --   BUN  --    < > 6 17 13   --  9 14 29*  CREATININE  --    < > 0.88 1.10 1.13  --  0.94 0.9 0.9  CALCIUM  --    < > 8.8*  --  8.9  --  8.4*  --   --   MG 1.8  --   --   --   --   --   --   --   --    < > = values in this interval not displayed.   Liver Function Tests: Recent Labs    06/29/16 1132 06/30/16 0608  AST 108* 94*  ALT 30 31  ALKPHOS 60 50  BILITOT 1.5* 1.4*  PROT 6.8 5.8*  ALBUMIN 3.7 3.0*   CBC: Recent Labs    06/29/16 1132  06/30/16 0608  07/09/16 0632  07/09/16 1201 07/10/16 0306 07/12/16  WBC 22.0*  --  14.9*   < > 12.8*  --  17.7*  12.7* 16.6  NEUTROABS 16.5*  --  9.5*  --    --   --   --   --   --   HGB 16.3   < > 14.0   < > 14.0   < > 9.7* 9.6* 10.2*  HCT 46.4   < > 40.5   < > 42.0   < > 28.8* 29.1* 30*  MCV 92.1  --  91.4   < > 93.8  --  93.2 94.8  --   PLT 277  --  219   < > 293  --  226 233 267   < > = values in this interval not displayed.   Lipid Panel: Recent Labs    06/30/16 0608  HDL 25*   Cardiac Enzymes: Recent Labs    07/01/16 0416 07/02/16 0852 07/09/16 0632  CKTOTAL 1,243* 556* 105   CBG: Recent Labs    07/09/16 1725 07/09/16 2127 07/10/16 0803  GLUCAP 148* 179* 121*      ASSESSMENT/PLAN:  1. Vascular dementia without behavioral disturbance - advanced, continue supportive care, fall precautions, followed-up by hospice   2. Benign prostatic hyperplasia, unspecified whether lower urinary tract symptoms present - continue Tamsulosine 0.4 mg 1 capsule Q evening   3. Anxiety - continue Clonazepam 0.5 mg 1 tab Q HS  4. Mood disorder (HCC) - mood is stable, continue Depakote 125 mg give 2 capsules = 250 mg Q AM and HS   5. Rash of back - persistent rashes, will discontinue Triamcinolone and start Betamethasone  0.1% ointment apply topically to rashes on back BID X 2 weeks     Goals of care:  Long-term care, Hospice care     [place]. [place] - NP    [employer] [phone]

## 2017-03-14 DIAGNOSIS — I679 Cerebrovascular disease, unspecified: Secondary | ICD-10-CM | POA: Diagnosis not present

## 2017-03-14 DIAGNOSIS — I672 Cerebral atherosclerosis: Secondary | ICD-10-CM | POA: Diagnosis not present

## 2017-03-14 DIAGNOSIS — R1310 Dysphagia, unspecified: Secondary | ICD-10-CM | POA: Diagnosis not present

## 2017-03-14 DIAGNOSIS — F0151 Vascular dementia with behavioral disturbance: Secondary | ICD-10-CM | POA: Diagnosis not present

## 2017-03-14 DIAGNOSIS — E1159 Type 2 diabetes mellitus with other circulatory complications: Secondary | ICD-10-CM | POA: Diagnosis not present

## 2017-03-14 DIAGNOSIS — I739 Peripheral vascular disease, unspecified: Secondary | ICD-10-CM | POA: Diagnosis not present

## 2017-03-16 DIAGNOSIS — R1310 Dysphagia, unspecified: Secondary | ICD-10-CM | POA: Diagnosis not present

## 2017-03-16 DIAGNOSIS — I672 Cerebral atherosclerosis: Secondary | ICD-10-CM | POA: Diagnosis not present

## 2017-03-16 DIAGNOSIS — F0151 Vascular dementia with behavioral disturbance: Secondary | ICD-10-CM | POA: Diagnosis not present

## 2017-03-16 DIAGNOSIS — I739 Peripheral vascular disease, unspecified: Secondary | ICD-10-CM | POA: Diagnosis not present

## 2017-03-16 DIAGNOSIS — E1159 Type 2 diabetes mellitus with other circulatory complications: Secondary | ICD-10-CM | POA: Diagnosis not present

## 2017-03-16 DIAGNOSIS — I679 Cerebrovascular disease, unspecified: Secondary | ICD-10-CM | POA: Diagnosis not present

## 2017-03-17 DIAGNOSIS — F0151 Vascular dementia with behavioral disturbance: Secondary | ICD-10-CM | POA: Diagnosis not present

## 2017-03-17 DIAGNOSIS — I739 Peripheral vascular disease, unspecified: Secondary | ICD-10-CM | POA: Diagnosis not present

## 2017-03-17 DIAGNOSIS — E1159 Type 2 diabetes mellitus with other circulatory complications: Secondary | ICD-10-CM | POA: Diagnosis not present

## 2017-03-17 DIAGNOSIS — R1310 Dysphagia, unspecified: Secondary | ICD-10-CM | POA: Diagnosis not present

## 2017-03-17 DIAGNOSIS — I672 Cerebral atherosclerosis: Secondary | ICD-10-CM | POA: Diagnosis not present

## 2017-03-17 DIAGNOSIS — I679 Cerebrovascular disease, unspecified: Secondary | ICD-10-CM | POA: Diagnosis not present

## 2017-03-19 DIAGNOSIS — R1310 Dysphagia, unspecified: Secondary | ICD-10-CM | POA: Diagnosis not present

## 2017-03-19 DIAGNOSIS — E1159 Type 2 diabetes mellitus with other circulatory complications: Secondary | ICD-10-CM | POA: Diagnosis not present

## 2017-03-19 DIAGNOSIS — I739 Peripheral vascular disease, unspecified: Secondary | ICD-10-CM | POA: Diagnosis not present

## 2017-03-19 DIAGNOSIS — I679 Cerebrovascular disease, unspecified: Secondary | ICD-10-CM | POA: Diagnosis not present

## 2017-03-19 DIAGNOSIS — D638 Anemia in other chronic diseases classified elsewhere: Secondary | ICD-10-CM | POA: Diagnosis not present

## 2017-03-19 DIAGNOSIS — F339 Major depressive disorder, recurrent, unspecified: Secondary | ICD-10-CM | POA: Diagnosis not present

## 2017-03-19 DIAGNOSIS — I672 Cerebral atherosclerosis: Secondary | ICD-10-CM | POA: Diagnosis not present

## 2017-03-19 DIAGNOSIS — I10 Essential (primary) hypertension: Secondary | ICD-10-CM | POA: Diagnosis not present

## 2017-03-19 DIAGNOSIS — G20 Parkinson's disease: Secondary | ICD-10-CM | POA: Diagnosis not present

## 2017-03-19 DIAGNOSIS — K219 Gastro-esophageal reflux disease without esophagitis: Secondary | ICD-10-CM | POA: Diagnosis not present

## 2017-03-19 DIAGNOSIS — N401 Enlarged prostate with lower urinary tract symptoms: Secondary | ICD-10-CM | POA: Diagnosis not present

## 2017-03-19 DIAGNOSIS — F0151 Vascular dementia with behavioral disturbance: Secondary | ICD-10-CM | POA: Diagnosis not present

## 2017-03-21 DIAGNOSIS — E1159 Type 2 diabetes mellitus with other circulatory complications: Secondary | ICD-10-CM | POA: Diagnosis not present

## 2017-03-21 DIAGNOSIS — R1310 Dysphagia, unspecified: Secondary | ICD-10-CM | POA: Diagnosis not present

## 2017-03-21 DIAGNOSIS — I679 Cerebrovascular disease, unspecified: Secondary | ICD-10-CM | POA: Diagnosis not present

## 2017-03-21 DIAGNOSIS — I672 Cerebral atherosclerosis: Secondary | ICD-10-CM | POA: Diagnosis not present

## 2017-03-21 DIAGNOSIS — I739 Peripheral vascular disease, unspecified: Secondary | ICD-10-CM | POA: Diagnosis not present

## 2017-03-21 DIAGNOSIS — F0151 Vascular dementia with behavioral disturbance: Secondary | ICD-10-CM | POA: Diagnosis not present

## 2017-03-22 DIAGNOSIS — E1159 Type 2 diabetes mellitus with other circulatory complications: Secondary | ICD-10-CM | POA: Diagnosis not present

## 2017-03-22 DIAGNOSIS — F0151 Vascular dementia with behavioral disturbance: Secondary | ICD-10-CM | POA: Diagnosis not present

## 2017-03-22 DIAGNOSIS — I679 Cerebrovascular disease, unspecified: Secondary | ICD-10-CM | POA: Diagnosis not present

## 2017-03-22 DIAGNOSIS — I739 Peripheral vascular disease, unspecified: Secondary | ICD-10-CM | POA: Diagnosis not present

## 2017-03-22 DIAGNOSIS — I672 Cerebral atherosclerosis: Secondary | ICD-10-CM | POA: Diagnosis not present

## 2017-03-22 DIAGNOSIS — R1310 Dysphagia, unspecified: Secondary | ICD-10-CM | POA: Diagnosis not present

## 2017-03-23 DIAGNOSIS — E1159 Type 2 diabetes mellitus with other circulatory complications: Secondary | ICD-10-CM | POA: Diagnosis not present

## 2017-03-23 DIAGNOSIS — I739 Peripheral vascular disease, unspecified: Secondary | ICD-10-CM | POA: Diagnosis not present

## 2017-03-23 DIAGNOSIS — F0151 Vascular dementia with behavioral disturbance: Secondary | ICD-10-CM | POA: Diagnosis not present

## 2017-03-23 DIAGNOSIS — I679 Cerebrovascular disease, unspecified: Secondary | ICD-10-CM | POA: Diagnosis not present

## 2017-03-23 DIAGNOSIS — I672 Cerebral atherosclerosis: Secondary | ICD-10-CM | POA: Diagnosis not present

## 2017-03-23 DIAGNOSIS — R1310 Dysphagia, unspecified: Secondary | ICD-10-CM | POA: Diagnosis not present

## 2017-03-24 DIAGNOSIS — E1159 Type 2 diabetes mellitus with other circulatory complications: Secondary | ICD-10-CM | POA: Diagnosis not present

## 2017-03-24 DIAGNOSIS — I679 Cerebrovascular disease, unspecified: Secondary | ICD-10-CM | POA: Diagnosis not present

## 2017-03-24 DIAGNOSIS — R1310 Dysphagia, unspecified: Secondary | ICD-10-CM | POA: Diagnosis not present

## 2017-03-24 DIAGNOSIS — I739 Peripheral vascular disease, unspecified: Secondary | ICD-10-CM | POA: Diagnosis not present

## 2017-03-24 DIAGNOSIS — F0151 Vascular dementia with behavioral disturbance: Secondary | ICD-10-CM | POA: Diagnosis not present

## 2017-03-24 DIAGNOSIS — I672 Cerebral atherosclerosis: Secondary | ICD-10-CM | POA: Diagnosis not present

## 2017-03-30 DIAGNOSIS — I679 Cerebrovascular disease, unspecified: Secondary | ICD-10-CM | POA: Diagnosis not present

## 2017-03-30 DIAGNOSIS — I739 Peripheral vascular disease, unspecified: Secondary | ICD-10-CM | POA: Diagnosis not present

## 2017-03-30 DIAGNOSIS — E1159 Type 2 diabetes mellitus with other circulatory complications: Secondary | ICD-10-CM | POA: Diagnosis not present

## 2017-03-30 DIAGNOSIS — F0151 Vascular dementia with behavioral disturbance: Secondary | ICD-10-CM | POA: Diagnosis not present

## 2017-03-30 DIAGNOSIS — I672 Cerebral atherosclerosis: Secondary | ICD-10-CM | POA: Diagnosis not present

## 2017-03-30 DIAGNOSIS — R1310 Dysphagia, unspecified: Secondary | ICD-10-CM | POA: Diagnosis not present

## 2017-04-04 DIAGNOSIS — E1159 Type 2 diabetes mellitus with other circulatory complications: Secondary | ICD-10-CM | POA: Diagnosis not present

## 2017-04-04 DIAGNOSIS — R1310 Dysphagia, unspecified: Secondary | ICD-10-CM | POA: Diagnosis not present

## 2017-04-04 DIAGNOSIS — I672 Cerebral atherosclerosis: Secondary | ICD-10-CM | POA: Diagnosis not present

## 2017-04-04 DIAGNOSIS — I679 Cerebrovascular disease, unspecified: Secondary | ICD-10-CM | POA: Diagnosis not present

## 2017-04-04 DIAGNOSIS — I739 Peripheral vascular disease, unspecified: Secondary | ICD-10-CM | POA: Diagnosis not present

## 2017-04-04 DIAGNOSIS — F0151 Vascular dementia with behavioral disturbance: Secondary | ICD-10-CM | POA: Diagnosis not present

## 2017-04-05 DIAGNOSIS — I672 Cerebral atherosclerosis: Secondary | ICD-10-CM | POA: Diagnosis not present

## 2017-04-05 DIAGNOSIS — I679 Cerebrovascular disease, unspecified: Secondary | ICD-10-CM | POA: Diagnosis not present

## 2017-04-05 DIAGNOSIS — F0151 Vascular dementia with behavioral disturbance: Secondary | ICD-10-CM | POA: Diagnosis not present

## 2017-04-05 DIAGNOSIS — I739 Peripheral vascular disease, unspecified: Secondary | ICD-10-CM | POA: Diagnosis not present

## 2017-04-05 DIAGNOSIS — E1159 Type 2 diabetes mellitus with other circulatory complications: Secondary | ICD-10-CM | POA: Diagnosis not present

## 2017-04-05 DIAGNOSIS — R1310 Dysphagia, unspecified: Secondary | ICD-10-CM | POA: Diagnosis not present

## 2017-04-06 DIAGNOSIS — I679 Cerebrovascular disease, unspecified: Secondary | ICD-10-CM | POA: Diagnosis not present

## 2017-04-06 DIAGNOSIS — E1159 Type 2 diabetes mellitus with other circulatory complications: Secondary | ICD-10-CM | POA: Diagnosis not present

## 2017-04-06 DIAGNOSIS — R1310 Dysphagia, unspecified: Secondary | ICD-10-CM | POA: Diagnosis not present

## 2017-04-06 DIAGNOSIS — I672 Cerebral atherosclerosis: Secondary | ICD-10-CM | POA: Diagnosis not present

## 2017-04-06 DIAGNOSIS — I739 Peripheral vascular disease, unspecified: Secondary | ICD-10-CM | POA: Diagnosis not present

## 2017-04-06 DIAGNOSIS — F0151 Vascular dementia with behavioral disturbance: Secondary | ICD-10-CM | POA: Diagnosis not present

## 2017-04-07 ENCOUNTER — Non-Acute Institutional Stay (SKILLED_NURSING_FACILITY): Payer: Medicare Other | Admitting: Internal Medicine

## 2017-04-07 ENCOUNTER — Encounter: Payer: Self-pay | Admitting: Internal Medicine

## 2017-04-07 DIAGNOSIS — G4700 Insomnia, unspecified: Secondary | ICD-10-CM

## 2017-04-07 DIAGNOSIS — I672 Cerebral atherosclerosis: Secondary | ICD-10-CM | POA: Diagnosis not present

## 2017-04-07 DIAGNOSIS — E118 Type 2 diabetes mellitus with unspecified complications: Secondary | ICD-10-CM | POA: Diagnosis not present

## 2017-04-07 DIAGNOSIS — F0151 Vascular dementia with behavioral disturbance: Secondary | ICD-10-CM | POA: Diagnosis not present

## 2017-04-07 DIAGNOSIS — E1159 Type 2 diabetes mellitus with other circulatory complications: Secondary | ICD-10-CM | POA: Diagnosis not present

## 2017-04-07 DIAGNOSIS — I10 Essential (primary) hypertension: Secondary | ICD-10-CM | POA: Diagnosis not present

## 2017-04-07 DIAGNOSIS — I679 Cerebrovascular disease, unspecified: Secondary | ICD-10-CM | POA: Diagnosis not present

## 2017-04-07 DIAGNOSIS — F0150 Vascular dementia without behavioral disturbance: Secondary | ICD-10-CM

## 2017-04-07 DIAGNOSIS — I739 Peripheral vascular disease, unspecified: Secondary | ICD-10-CM | POA: Diagnosis not present

## 2017-04-07 DIAGNOSIS — R1310 Dysphagia, unspecified: Secondary | ICD-10-CM | POA: Diagnosis not present

## 2017-04-07 NOTE — Assessment & Plan Note (Signed)
He exhibits no behavioral issues at this time except for sleep disruption with nighttime restlessness Psych follow-up at next site visit

## 2017-04-07 NOTE — Assessment & Plan Note (Signed)
Sleep disruption improved but still present. Psych follow-up @ next site visit

## 2017-04-07 NOTE — Assessment & Plan Note (Signed)
Glucoses have been well controlled and last A1c verifies this. A1c would be due in January. If this is over 8, renal function will be checked prior to initiation of metformin.

## 2017-04-07 NOTE — Assessment & Plan Note (Signed)
BP controlled; no antihypertensive medication indicated. Tamsulosin may be having some antihypertensive effect

## 2017-04-07 NOTE — Progress Notes (Signed)
NURSING HOME LOCATION:  [facility] ROOM NUMBER:  320  CODE STATUS:  DNR  PCP:  Hendricks Limes, MD [address] [place] [address]  This is a nursing facility follow up for specific acute issue of chronic medical diagnoses.  Interim medical record and care since last [place] visit was updated with review of diagnostic studies and change in clinical status since last visit were documented.  HPI: He has been a permanent resident of the facility and is under the care of  Hospice. Has vascular dementia related to ischemic strokes. He has diffuse vasculopathy including carotid stenosis, peripheral vascular disease and coronary artery disease. Other diagnoses include hypertension, diabetes, and prostatism with history of urinary retention. He has exhibited some signs of sundowning and has been on clonazepam at bedtime as needed. Hospice nurse states he continues to have nocturnal sleep disruption. For mood disorder he is also on Depakote. Psychiatry had been monitoring the patient.  He remains on tamsulosin for his prostate dysfunction. Hemoglobin A1c indicated good control on 9/25 with a value of 6.7%. Other labs are not current. Renal function has been normal but he has exhibited intermittent prerenal azotemia.  Review of systems: Dementia invalidated responses which was "no" to all queries. He was unable to give the date or the name of the president.  Constitutional: No fever,significant weight change, fatigue  Eyes: No redness, discharge, pain, vision change ENT/mouth: No nasal congestion,  purulent discharge, earache,change in hearing ,sore throat  Cardiovascular: No chest pain, palpitations,paroxysmal nocturnal dyspnea, claudication, edema  Respiratory: No cough, sputum production,hemoptysis, DOE , significant snoring,apnea  Gastrointestinal: No heartburn,dysphagia,abdominal pain, nausea / vomiting,rectal bleeding, melena,change in bowels Genitourinary: No  dysuria,hematuria, pyuria,  incontinence, nocturia Musculoskeletal: No joint stiffness, joint swelling, weakness,pain Dermatologic: No rash, pruritus, change in appearance of skin Neurologic: No dizziness,headache,syncope, seizures, numbness , tingling Psychiatric: No significant anxiety , depression, insomnia, anorexia Endocrine: No change in hair/skin/ nails, excessive thirst, excessive hunger, excessive urination  Hematologic/lymphatic: No significant bruising, lymphadenopathy,abnormal bleeding Allergy/immunology: No itchy/ watery eyes, significant sneezing, urticaria, angioedema  Physical exam:  Pertinent or positive findings: He sits in the wheelchair leaning to the left. His head is also deviated to left. He does not exhibit torticollis. Ptosis is present on the right. Heart sounds are distant. Peripheral pulses are decreased. There is asymmetric weakness. He appears to be weaker in the right upper extremity than the left. The left lower extremity appears to be weaker than the right lower extremity. He has scattered keratoses over the extremities. There is a large eschar over the left shin. He has isolated flexion contractures in fingers.   General appearance:Adequately nourished; no acute distress , increased work of breathing is present.   Lymphatic: No lymphadenopathy about the head, neck, axilla . Eyes: No conjunctival inflammation or lid edema is present. There is no scleral icterus. Ears:  External ear exam shows no significant lesions or deformities.   Nose:  External nasal examination shows no deformity or inflammation. Nasal mucosa are pink and moist without lesions ,exudates Oral exam: lips and gums are healthy appearing.There is no oropharyngeal erythema or exudate . Neck:  No thyromegaly, masses, tenderness noted.    Heart:  Normal rate and regular rhythm. S1 and S2 normal without gallop, murmur, click, rub .  Lungs: without wheezes, rhonchi,rales , rubs. Abdomen:Bowel sounds  are normal. Abdomen is soft and nontender with no organomegaly, hernias,masses. GU: deferred  Extremities:  No cyanosis, clubbing,edema  Neurologic exam :  Balance,Rhomberg,finger to nose testing could not be completed due to clinical state Skin: Warm & dry w/o tenting. No significant rash.  See summary under each active problem in the Problem List with associated updated therapeutic plan

## 2017-04-07 NOTE — Patient Instructions (Addendum)
See assessment and plan under each diagnosis in the problem list and acutely for this visit Total time 26 minutes; greater than 50% of the visit spent counseling patient's daughter and coordinating care for problems addressed at this encounter. Hospice nurse in attendance & included in discussion

## 2017-04-11 DIAGNOSIS — E1159 Type 2 diabetes mellitus with other circulatory complications: Secondary | ICD-10-CM | POA: Diagnosis not present

## 2017-04-11 DIAGNOSIS — R1310 Dysphagia, unspecified: Secondary | ICD-10-CM | POA: Diagnosis not present

## 2017-04-11 DIAGNOSIS — I739 Peripheral vascular disease, unspecified: Secondary | ICD-10-CM | POA: Diagnosis not present

## 2017-04-11 DIAGNOSIS — I679 Cerebrovascular disease, unspecified: Secondary | ICD-10-CM | POA: Diagnosis not present

## 2017-04-11 DIAGNOSIS — F0151 Vascular dementia with behavioral disturbance: Secondary | ICD-10-CM | POA: Diagnosis not present

## 2017-04-11 DIAGNOSIS — I672 Cerebral atherosclerosis: Secondary | ICD-10-CM | POA: Diagnosis not present

## 2017-04-13 ENCOUNTER — Other Ambulatory Visit: Payer: Self-pay

## 2017-04-13 DIAGNOSIS — I739 Peripheral vascular disease, unspecified: Secondary | ICD-10-CM | POA: Diagnosis not present

## 2017-04-13 DIAGNOSIS — R1310 Dysphagia, unspecified: Secondary | ICD-10-CM | POA: Diagnosis not present

## 2017-04-13 DIAGNOSIS — F0151 Vascular dementia with behavioral disturbance: Secondary | ICD-10-CM | POA: Diagnosis not present

## 2017-04-13 DIAGNOSIS — E1159 Type 2 diabetes mellitus with other circulatory complications: Secondary | ICD-10-CM | POA: Diagnosis not present

## 2017-04-13 DIAGNOSIS — I672 Cerebral atherosclerosis: Secondary | ICD-10-CM | POA: Diagnosis not present

## 2017-04-13 DIAGNOSIS — I679 Cerebrovascular disease, unspecified: Secondary | ICD-10-CM | POA: Diagnosis not present

## 2017-04-13 MED ORDER — CLONAZEPAM 0.5 MG PO TABS: 0.5000 mg | ORAL_TABLET | Freq: Every evening | ORAL | 0 refills | Status: DC | PRN

## 2017-04-13 NOTE — Telephone Encounter (Signed)
Order faxed to [facility] Fax [phone]

## 2017-04-18 DIAGNOSIS — I739 Peripheral vascular disease, unspecified: Secondary | ICD-10-CM | POA: Diagnosis not present

## 2017-04-18 DIAGNOSIS — E1159 Type 2 diabetes mellitus with other circulatory complications: Secondary | ICD-10-CM | POA: Diagnosis not present

## 2017-04-18 DIAGNOSIS — I672 Cerebral atherosclerosis: Secondary | ICD-10-CM | POA: Diagnosis not present

## 2017-04-18 DIAGNOSIS — I679 Cerebrovascular disease, unspecified: Secondary | ICD-10-CM | POA: Diagnosis not present

## 2017-04-18 DIAGNOSIS — R1310 Dysphagia, unspecified: Secondary | ICD-10-CM | POA: Diagnosis not present

## 2017-04-18 DIAGNOSIS — F0151 Vascular dementia with behavioral disturbance: Secondary | ICD-10-CM | POA: Diagnosis not present

## 2017-04-19 DIAGNOSIS — G20 Parkinson's disease: Secondary | ICD-10-CM | POA: Diagnosis not present

## 2017-04-19 DIAGNOSIS — I10 Essential (primary) hypertension: Secondary | ICD-10-CM | POA: Diagnosis not present

## 2017-04-19 DIAGNOSIS — N401 Enlarged prostate with lower urinary tract symptoms: Secondary | ICD-10-CM | POA: Diagnosis not present

## 2017-04-19 DIAGNOSIS — K219 Gastro-esophageal reflux disease without esophagitis: Secondary | ICD-10-CM | POA: Diagnosis not present

## 2017-04-19 DIAGNOSIS — I679 Cerebrovascular disease, unspecified: Secondary | ICD-10-CM | POA: Diagnosis not present

## 2017-04-19 DIAGNOSIS — I739 Peripheral vascular disease, unspecified: Secondary | ICD-10-CM | POA: Diagnosis not present

## 2017-04-19 DIAGNOSIS — F339 Major depressive disorder, recurrent, unspecified: Secondary | ICD-10-CM | POA: Diagnosis not present

## 2017-04-19 DIAGNOSIS — D638 Anemia in other chronic diseases classified elsewhere: Secondary | ICD-10-CM | POA: Diagnosis not present

## 2017-04-19 DIAGNOSIS — R1310 Dysphagia, unspecified: Secondary | ICD-10-CM | POA: Diagnosis not present

## 2017-04-19 DIAGNOSIS — F0151 Vascular dementia with behavioral disturbance: Secondary | ICD-10-CM | POA: Diagnosis not present

## 2017-04-19 DIAGNOSIS — I672 Cerebral atherosclerosis: Secondary | ICD-10-CM | POA: Diagnosis not present

## 2017-04-19 DIAGNOSIS — E1159 Type 2 diabetes mellitus with other circulatory complications: Secondary | ICD-10-CM | POA: Diagnosis not present

## 2017-04-20 DIAGNOSIS — F0151 Vascular dementia with behavioral disturbance: Secondary | ICD-10-CM | POA: Diagnosis not present

## 2017-04-20 DIAGNOSIS — I739 Peripheral vascular disease, unspecified: Secondary | ICD-10-CM | POA: Diagnosis not present

## 2017-04-20 DIAGNOSIS — I672 Cerebral atherosclerosis: Secondary | ICD-10-CM | POA: Diagnosis not present

## 2017-04-20 DIAGNOSIS — R1310 Dysphagia, unspecified: Secondary | ICD-10-CM | POA: Diagnosis not present

## 2017-04-20 DIAGNOSIS — E1159 Type 2 diabetes mellitus with other circulatory complications: Secondary | ICD-10-CM | POA: Diagnosis not present

## 2017-04-20 DIAGNOSIS — I679 Cerebrovascular disease, unspecified: Secondary | ICD-10-CM | POA: Diagnosis not present

## 2017-04-21 DIAGNOSIS — R1310 Dysphagia, unspecified: Secondary | ICD-10-CM | POA: Diagnosis not present

## 2017-04-21 DIAGNOSIS — I679 Cerebrovascular disease, unspecified: Secondary | ICD-10-CM | POA: Diagnosis not present

## 2017-04-21 DIAGNOSIS — I672 Cerebral atherosclerosis: Secondary | ICD-10-CM | POA: Diagnosis not present

## 2017-04-21 DIAGNOSIS — I739 Peripheral vascular disease, unspecified: Secondary | ICD-10-CM | POA: Diagnosis not present

## 2017-04-21 DIAGNOSIS — F0151 Vascular dementia with behavioral disturbance: Secondary | ICD-10-CM | POA: Diagnosis not present

## 2017-04-21 DIAGNOSIS — E1159 Type 2 diabetes mellitus with other circulatory complications: Secondary | ICD-10-CM | POA: Diagnosis not present

## 2017-04-25 DIAGNOSIS — E1159 Type 2 diabetes mellitus with other circulatory complications: Secondary | ICD-10-CM | POA: Diagnosis not present

## 2017-04-25 DIAGNOSIS — I739 Peripheral vascular disease, unspecified: Secondary | ICD-10-CM | POA: Diagnosis not present

## 2017-04-25 DIAGNOSIS — I672 Cerebral atherosclerosis: Secondary | ICD-10-CM | POA: Diagnosis not present

## 2017-04-25 DIAGNOSIS — I679 Cerebrovascular disease, unspecified: Secondary | ICD-10-CM | POA: Diagnosis not present

## 2017-04-25 DIAGNOSIS — R1310 Dysphagia, unspecified: Secondary | ICD-10-CM | POA: Diagnosis not present

## 2017-04-25 DIAGNOSIS — F0151 Vascular dementia with behavioral disturbance: Secondary | ICD-10-CM | POA: Diagnosis not present

## 2017-04-27 DIAGNOSIS — F0151 Vascular dementia with behavioral disturbance: Secondary | ICD-10-CM | POA: Diagnosis not present

## 2017-04-27 DIAGNOSIS — I679 Cerebrovascular disease, unspecified: Secondary | ICD-10-CM | POA: Diagnosis not present

## 2017-04-27 DIAGNOSIS — I672 Cerebral atherosclerosis: Secondary | ICD-10-CM | POA: Diagnosis not present

## 2017-04-27 DIAGNOSIS — R1310 Dysphagia, unspecified: Secondary | ICD-10-CM | POA: Diagnosis not present

## 2017-04-27 DIAGNOSIS — E1159 Type 2 diabetes mellitus with other circulatory complications: Secondary | ICD-10-CM | POA: Diagnosis not present

## 2017-04-27 DIAGNOSIS — I739 Peripheral vascular disease, unspecified: Secondary | ICD-10-CM | POA: Diagnosis not present

## 2017-04-28 DIAGNOSIS — I672 Cerebral atherosclerosis: Secondary | ICD-10-CM | POA: Diagnosis not present

## 2017-04-28 DIAGNOSIS — E1159 Type 2 diabetes mellitus with other circulatory complications: Secondary | ICD-10-CM | POA: Diagnosis not present

## 2017-04-28 DIAGNOSIS — I679 Cerebrovascular disease, unspecified: Secondary | ICD-10-CM | POA: Diagnosis not present

## 2017-04-28 DIAGNOSIS — F0151 Vascular dementia with behavioral disturbance: Secondary | ICD-10-CM | POA: Diagnosis not present

## 2017-04-28 DIAGNOSIS — R1310 Dysphagia, unspecified: Secondary | ICD-10-CM | POA: Diagnosis not present

## 2017-04-28 DIAGNOSIS — I739 Peripheral vascular disease, unspecified: Secondary | ICD-10-CM | POA: Diagnosis not present

## 2017-05-02 ENCOUNTER — Non-Acute Institutional Stay (SKILLED_NURSING_FACILITY): Payer: Medicare Other | Admitting: Adult Health

## 2017-05-02 ENCOUNTER — Encounter: Payer: Self-pay | Admitting: Adult Health

## 2017-05-02 DIAGNOSIS — R1310 Dysphagia, unspecified: Secondary | ICD-10-CM | POA: Diagnosis not present

## 2017-05-02 DIAGNOSIS — F0151 Vascular dementia with behavioral disturbance: Secondary | ICD-10-CM | POA: Diagnosis not present

## 2017-05-02 DIAGNOSIS — F419 Anxiety disorder, unspecified: Secondary | ICD-10-CM

## 2017-05-02 DIAGNOSIS — I10 Essential (primary) hypertension: Secondary | ICD-10-CM

## 2017-05-02 DIAGNOSIS — N400 Enlarged prostate without lower urinary tract symptoms: Secondary | ICD-10-CM | POA: Diagnosis not present

## 2017-05-02 DIAGNOSIS — I739 Peripheral vascular disease, unspecified: Secondary | ICD-10-CM | POA: Diagnosis not present

## 2017-05-02 DIAGNOSIS — F0150 Vascular dementia without behavioral disturbance: Secondary | ICD-10-CM | POA: Diagnosis not present

## 2017-05-02 DIAGNOSIS — I672 Cerebral atherosclerosis: Secondary | ICD-10-CM | POA: Diagnosis not present

## 2017-05-02 DIAGNOSIS — F39 Unspecified mood [affective] disorder: Secondary | ICD-10-CM | POA: Diagnosis not present

## 2017-05-02 DIAGNOSIS — F339 Major depressive disorder, recurrent, unspecified: Secondary | ICD-10-CM

## 2017-05-02 DIAGNOSIS — I679 Cerebrovascular disease, unspecified: Secondary | ICD-10-CM | POA: Diagnosis not present

## 2017-05-02 DIAGNOSIS — E1159 Type 2 diabetes mellitus with other circulatory complications: Secondary | ICD-10-CM | POA: Diagnosis not present

## 2017-05-02 NOTE — Progress Notes (Signed)
Location:  [facility] Room Number: 485-I Place of Service:  SNF (31) Provider:  Durenda Age, NP  Patient Care Team: Hendricks Limes, MD as PCP - General (Internal Medicine)  Extended Emergency Contact Information Primary Emergency Contact: Caropreso,Edward  [place] Mobile Phone: [phone] Relation: Relative Secondary Emergency Contact: Bilger,Betsy  [place] Mobile Phone: [phone] Relation: Relative  Code Status:  DNR  Goals of care: Advanced Directive information Advanced Directives 04/07/2017  Does Patient Have a Medical Advance Directive? Yes  Type of Advance Directive Out of facility DNR (pink MOST or yellow form)  Does patient want to make changes to medical advance directive? No - Patient declined  Copy of [place] in Chart? -  Would patient like information on creating a medical advance directive? -  Pre-existing out of facility DNR order (yellow form or pink MOST form) -     Chief Complaint  Patient presents with  . Medical Management of Chronic Issues    Routine [facility] SN[facility] visit    HPI:  Pt is a 84 y.o. male seen today for medical management of chronic diseases.  He is a long-term care resident of [facility]. He has a PMH of dementia, HTN, urinary retention, PVD, and chronic leukocytosis. He was seen in his room today. He was sleeping but was was easily aroused to verbal greeting. He was recently started on Sertraline for depression. He sometimes would call wife who passed away and would cry to caregiver.   Past Medical History:  Diagnosis Date  . Arthritis   . Carotid artery occlusion   . GERD (gastroesophageal reflux disease)   . Headache    "sometimes monthly" (06/29/2016)  . Heart block   . History of blood transfusion    "while in the service"  . History of stomach ulcers   . Hypertension   . Insomnia   . Pancreatitis   . Prostate  cancer ([place])    Archie Endo 09/02/2010  . Prostate enlargement   . PVC's (premature ventricular contractions)   . PVD (peripheral vascular disease) ([place])   . Rhabdomyolysis 06/2016  . Stroke ([place]) 05/2013; 06/29/2016   Archie Endo 05/24/2013  . Type II diabetes mellitus ([place]) dx'd 2008   Archie Endo 08/19/2010   Past Surgical History:  Procedure Laterality Date  . CATARACT EXTRACTION W/ INTRAOCULAR LENS  IMPLANT, BILATERAL Bilateral   . CHOLECYSTECTOMY  07/18/2011   Procedure: LAPAROSCOPIC CHOLECYSTECTOMY;  Surgeon: Gwenyth Ober, MD;  Location: [facility];  Service: General;  Laterality: N/A;  . ENDARTERECTOMY Right 05/28/2013   Procedure: ENDARTERECTOMY CAROTID;  Surgeon: Rosetta Posner, MD;  Location: [facility];  Service: Vascular;  Laterality: Right;  . ENDARTERECTOMY Left 07/09/2016   Procedure: ENDARTERECTOMY CAROTID;  Surgeon: Rosetta Posner, MD;  Location: [facility];  Service: Vascular;  Laterality: Left;  . ESOPHAGOGASTRODUODENOSCOPY (EGD) WITH ESOPHAGEAL DILATION  2004; 12/2009   Archie Endo 01/08/2010  . FLEXIBLE SIGMOIDOSCOPY N/A 09/23/2014   Procedure: FLEXIBLE SIGMOIDOSCOPY;  Surgeon: Garlan Fair, MD;  Location: [facility];  Service: Endoscopy;  Laterality: N/A;  . INSERTION PROSTATE RADIATION SEED  03/1999   Archie Endo 09/02/2010  . PATCH ANGIOPLASTY Left 07/09/2016   Procedure: PATCH ANGIOPLASTY LEFT CAROTID ARTERY;  Surgeon: Rosetta Posner, MD;  Location: [facility];  Service: Vascular;  Laterality: Left;  . PROSTATE BIOPSY  2000   Archie Endo 09/02/2010  . TONSILLECTOMY      Allergies  Allergen Reactions  . Penicillins Rash  Has patient had a PCN reaction causing immediate rash, facial/tongue/throat swelling, SOB or lightheadedness with hypotension: No Has patient had a PCN reaction causing severe rash involving mucus membranes or skin necrosis: No Has patient had a PCN reaction that required hospitalization No Has patient had a PCN reaction occurring within the last 10 years: No If all of the above answers are "NO",  then may proceed with Cephalosporin use.    Outpatient Encounter Medications as of 05/02/2017  Medication Sig  . acetaminophen (TYLENOL) 325 MG tablet Take 325 mg by mouth every 6 (six) hours as needed for mild pain.   . bisacodyl (DULCOLAX) 10 MG suppository Place 10 mg rectally daily as needed for moderate constipation (for constipation not relieved by milk of magnesium).  . clonazePAM (KLONOPIN) 0.5 MG tablet Take 1 tablet (0.5 mg total) by mouth at bedtime as needed for anxiety.  . divalproex (DEPAKOTE SPRINKLE) 125 MG capsule Take 125 mg by mouth 2 (two) times daily. Take at [facility] and noon.  . divalproex (DEPAKOTE SPRINKLE) 125 MG capsule Take 250 mg by mouth at bedtime.  . Emollient (CERAVE) LOTN Apply topically daily as needed. May keep at bedside to apply to arms and legs as needed for dry skin  . magnesium hydroxide (MILK OF MAGNESIA) 400 MG/5ML suspension Take 30 mLs by mouth daily as needed (for constipation).  . NUTRITIONAL SUPPLEMENT LIQD Take 120 mLs by mouth daily. MedPass  . sertraline (ZOLOFT) 25 MG tablet Take 25 mg by mouth daily.  . Tamsulosin HCl (FLOMAX) 0.4 MG CAPS Take 0.4 mg by mouth daily at 6 PM.    No facility-administered encounter medications on file as of 05/02/2017.     Review of Systems  Unable to obtain due to dementia     Immunization History  Administered Date(s) Administered  . Influenza-Unspecified 11/18/2015, 02/03/2017  . PPD Test 07/26/2016  . Pneumococcal-Unspecified 10/05/1988   Pertinent  Health Maintenance Due  Topic Date Due  . FOOT EXAM  09/16/2017 (Originally 10/05/1931)  . URINE MICROALBUMIN  09/16/2017 (Originally 10/05/1931)  . PNA vac Low Risk Adult (2 of 2 - PCV13) 09/16/2017 (Originally 10/05/1989)  . HEMOGLOBIN A1C  07/11/2017  . OPHTHALMOLOGY EXAM  11/11/2017  . INFLUENZA VACCINE  Completed      Vitals:   05/02/17 0938  BP: 136/78  Pulse: 88  Resp: 20  Temp: (!) 97.1 F (36.2 C)  TempSrc: Oral  SpO2: 97%  Weight: 186  lb 9.6 oz (84.6 kg)  Height: 5\' 8"  (1.727 m)   Body mass index is 28.37 kg/m.  Physical Exam  GENERAL APPEARANCE: Well nourished. In no acute distress. Normal body habitus SKIN:  Skin is warm and dry.  MOUTH and THROAT: Lips are without lesions. Oral mucosa is moist and without lesions. Tongue is normal in shape, size, and color and without lesions RESPIRATORY: Breathing is even & unlabored, BS CTAB CARDIAC: RRR, no murmur,no extra heart sounds, no edema GI: Abdomen soft, normal BS, no masses, no tenderness EXTREMITIES:  Able to move X 4 extremities PSYCHIATRIC: Alert to self, disoriented to time and place. Affect and behavior are appropriate   Labs reviewed: Recent Labs    06/29/16 1413  07/02/16 0852 07/04/16 1546 07/09/16 0632  07/10/16 0306 07/12/16 07/28/16  NA  --    < > 141 142 140   < > 140 142 142  K  --    < > 3.7 3.7 3.9   < > 4.0 4.4 3.9  CL  --    < >  104 105 103  --  105  --   --   CO2  --    < > 26  --  27  --  29  --   --   GLUCOSE  --    < > 108* 100* 101*  --  141*  --   --   BUN  --    < > 6 17 13   --  9 14 29*  CREATININE  --    < > 0.88 1.10 1.13  --  0.94 0.9 0.9  CALCIUM  --    < > 8.8*  --  8.9  --  8.4*  --   --   MG 1.8  --   --   --   --   --   --   --   --    < > = values in this interval not displayed.   Recent Labs    06/29/16 1132 06/30/16 0608  AST 108* 94*  ALT 30 31  ALKPHOS 60 50  BILITOT 1.5* 1.4*  PROT 6.8 5.8*  ALBUMIN 3.7 3.0*   Recent Labs    06/29/16 1132  06/30/16 0608  07/09/16 0632  07/09/16 1201 07/10/16 0306 07/12/16  WBC 22.0*  --  14.9*   < > 12.8*  --  17.7* 12.7* 16.6  NEUTROABS 16.5*  --  9.5*  --   --   --   --   --   --   HGB 16.3   < > 14.0   < > 14.0   < > 9.7* 9.6* 10.2*  HCT 46.4   < > 40.5   < > 42.0   < > 28.8* 29.1* 30*  MCV 92.1  --  91.4   < > 93.8  --  93.2 94.8  --   PLT 277  --  219   < > 293  --  226 233 267   < > = values in this interval not displayed.   Lab Results  Component Value  Date   TSH 3.779 08/05/2015   Lab Results  Component Value Date   HGBA1C 6.7 01/11/2017   Lab Results  Component Value Date   CHOL 147 06/30/2016   HDL 25 (L) 06/30/2016   LDLCALC 99 06/30/2016   TRIG 113 06/30/2016   CHOLHDL 5.9 06/30/2016    Assessment/Plan  1. Benign prostatic hyperplasia, unspecified whether lower urinary tract symptoms present - continue tamsulosin 0.4 mg 1 capsule daily evening   2. Essential hypertension - not on any medications, stable   3. Mood disorder (HCC) - continue Depakote 125 mg give 2 capsules = 250 mg every morning and bedtime , 125 mg 1 capsule every 12 PM   4. Depression, recurrent ([place]) - recently started on sertraline 25 mg daily    5. Vascular dementia without behavioral disturbance - continue supportive care, fall precautions, followed up by hospice   6. Anxiety - continue clonazepam 0.5 mg 1 tab daily at bedtime     Family/ staff Communication: Discussed plan of care with resident and charge nurse.  Labs/tests ordered:  None  Goals of care:   Long-term care, Hospice care  Durenda Age, NP [facility] [phone] (Monday-Friday 8:00 a.m. - 5:00 p.m.) [phone] (after hours)

## 2017-05-03 DIAGNOSIS — I739 Peripheral vascular disease, unspecified: Secondary | ICD-10-CM | POA: Diagnosis not present

## 2017-05-03 DIAGNOSIS — E1159 Type 2 diabetes mellitus with other circulatory complications: Secondary | ICD-10-CM | POA: Diagnosis not present

## 2017-05-03 DIAGNOSIS — F0151 Vascular dementia with behavioral disturbance: Secondary | ICD-10-CM | POA: Diagnosis not present

## 2017-05-03 DIAGNOSIS — R1310 Dysphagia, unspecified: Secondary | ICD-10-CM | POA: Diagnosis not present

## 2017-05-03 DIAGNOSIS — I672 Cerebral atherosclerosis: Secondary | ICD-10-CM | POA: Diagnosis not present

## 2017-05-03 DIAGNOSIS — I679 Cerebrovascular disease, unspecified: Secondary | ICD-10-CM | POA: Diagnosis not present

## 2017-05-04 DIAGNOSIS — I679 Cerebrovascular disease, unspecified: Secondary | ICD-10-CM | POA: Diagnosis not present

## 2017-05-04 DIAGNOSIS — I739 Peripheral vascular disease, unspecified: Secondary | ICD-10-CM | POA: Diagnosis not present

## 2017-05-04 DIAGNOSIS — I672 Cerebral atherosclerosis: Secondary | ICD-10-CM | POA: Diagnosis not present

## 2017-05-04 DIAGNOSIS — E1159 Type 2 diabetes mellitus with other circulatory complications: Secondary | ICD-10-CM | POA: Diagnosis not present

## 2017-05-04 DIAGNOSIS — F0151 Vascular dementia with behavioral disturbance: Secondary | ICD-10-CM | POA: Diagnosis not present

## 2017-05-04 DIAGNOSIS — R1310 Dysphagia, unspecified: Secondary | ICD-10-CM | POA: Diagnosis not present

## 2017-05-05 DIAGNOSIS — I672 Cerebral atherosclerosis: Secondary | ICD-10-CM | POA: Diagnosis not present

## 2017-05-05 DIAGNOSIS — R1310 Dysphagia, unspecified: Secondary | ICD-10-CM | POA: Diagnosis not present

## 2017-05-05 DIAGNOSIS — F0151 Vascular dementia with behavioral disturbance: Secondary | ICD-10-CM | POA: Diagnosis not present

## 2017-05-05 DIAGNOSIS — E1159 Type 2 diabetes mellitus with other circulatory complications: Secondary | ICD-10-CM | POA: Diagnosis not present

## 2017-05-05 DIAGNOSIS — I739 Peripheral vascular disease, unspecified: Secondary | ICD-10-CM | POA: Diagnosis not present

## 2017-05-05 DIAGNOSIS — I679 Cerebrovascular disease, unspecified: Secondary | ICD-10-CM | POA: Diagnosis not present

## 2017-05-08 DIAGNOSIS — E1159 Type 2 diabetes mellitus with other circulatory complications: Secondary | ICD-10-CM | POA: Diagnosis not present

## 2017-05-08 DIAGNOSIS — F0151 Vascular dementia with behavioral disturbance: Secondary | ICD-10-CM | POA: Diagnosis not present

## 2017-05-08 DIAGNOSIS — I672 Cerebral atherosclerosis: Secondary | ICD-10-CM | POA: Diagnosis not present

## 2017-05-08 DIAGNOSIS — I739 Peripheral vascular disease, unspecified: Secondary | ICD-10-CM | POA: Diagnosis not present

## 2017-05-08 DIAGNOSIS — R1310 Dysphagia, unspecified: Secondary | ICD-10-CM | POA: Diagnosis not present

## 2017-05-08 DIAGNOSIS — I679 Cerebrovascular disease, unspecified: Secondary | ICD-10-CM | POA: Diagnosis not present

## 2017-05-11 DIAGNOSIS — F0151 Vascular dementia with behavioral disturbance: Secondary | ICD-10-CM | POA: Diagnosis not present

## 2017-05-11 DIAGNOSIS — I679 Cerebrovascular disease, unspecified: Secondary | ICD-10-CM | POA: Diagnosis not present

## 2017-05-11 DIAGNOSIS — I739 Peripheral vascular disease, unspecified: Secondary | ICD-10-CM | POA: Diagnosis not present

## 2017-05-11 DIAGNOSIS — R1310 Dysphagia, unspecified: Secondary | ICD-10-CM | POA: Diagnosis not present

## 2017-05-11 DIAGNOSIS — E1159 Type 2 diabetes mellitus with other circulatory complications: Secondary | ICD-10-CM | POA: Diagnosis not present

## 2017-05-11 DIAGNOSIS — I672 Cerebral atherosclerosis: Secondary | ICD-10-CM | POA: Diagnosis not present

## 2017-05-12 DIAGNOSIS — F0150 Vascular dementia without behavioral disturbance: Secondary | ICD-10-CM | POA: Diagnosis not present

## 2017-05-12 DIAGNOSIS — F339 Major depressive disorder, recurrent, unspecified: Secondary | ICD-10-CM | POA: Diagnosis not present

## 2017-05-13 ENCOUNTER — Other Ambulatory Visit: Payer: Self-pay

## 2017-05-13 DIAGNOSIS — E1159 Type 2 diabetes mellitus with other circulatory complications: Secondary | ICD-10-CM | POA: Diagnosis not present

## 2017-05-13 DIAGNOSIS — I679 Cerebrovascular disease, unspecified: Secondary | ICD-10-CM | POA: Diagnosis not present

## 2017-05-13 DIAGNOSIS — I672 Cerebral atherosclerosis: Secondary | ICD-10-CM | POA: Diagnosis not present

## 2017-05-13 DIAGNOSIS — R1310 Dysphagia, unspecified: Secondary | ICD-10-CM | POA: Diagnosis not present

## 2017-05-13 DIAGNOSIS — F0151 Vascular dementia with behavioral disturbance: Secondary | ICD-10-CM | POA: Diagnosis not present

## 2017-05-13 DIAGNOSIS — I739 Peripheral vascular disease, unspecified: Secondary | ICD-10-CM | POA: Diagnosis not present

## 2017-05-13 MED ORDER — CLONAZEPAM 0.5 MG PO TABS: 0.5000 mg | ORAL_TABLET | Freq: Every evening | ORAL | 0 refills | Status: DC | PRN

## 2017-05-16 DIAGNOSIS — I672 Cerebral atherosclerosis: Secondary | ICD-10-CM | POA: Diagnosis not present

## 2017-05-16 DIAGNOSIS — F0151 Vascular dementia with behavioral disturbance: Secondary | ICD-10-CM | POA: Diagnosis not present

## 2017-05-16 DIAGNOSIS — R1310 Dysphagia, unspecified: Secondary | ICD-10-CM | POA: Diagnosis not present

## 2017-05-16 DIAGNOSIS — E1159 Type 2 diabetes mellitus with other circulatory complications: Secondary | ICD-10-CM | POA: Diagnosis not present

## 2017-05-16 DIAGNOSIS — I679 Cerebrovascular disease, unspecified: Secondary | ICD-10-CM | POA: Diagnosis not present

## 2017-05-16 DIAGNOSIS — I739 Peripheral vascular disease, unspecified: Secondary | ICD-10-CM | POA: Diagnosis not present

## 2017-05-18 DIAGNOSIS — R1310 Dysphagia, unspecified: Secondary | ICD-10-CM | POA: Diagnosis not present

## 2017-05-18 DIAGNOSIS — I672 Cerebral atherosclerosis: Secondary | ICD-10-CM | POA: Diagnosis not present

## 2017-05-18 DIAGNOSIS — I679 Cerebrovascular disease, unspecified: Secondary | ICD-10-CM | POA: Diagnosis not present

## 2017-05-18 DIAGNOSIS — I739 Peripheral vascular disease, unspecified: Secondary | ICD-10-CM | POA: Diagnosis not present

## 2017-05-18 DIAGNOSIS — F0151 Vascular dementia with behavioral disturbance: Secondary | ICD-10-CM | POA: Diagnosis not present

## 2017-05-18 DIAGNOSIS — E1159 Type 2 diabetes mellitus with other circulatory complications: Secondary | ICD-10-CM | POA: Diagnosis not present

## 2017-05-19 DIAGNOSIS — I679 Cerebrovascular disease, unspecified: Secondary | ICD-10-CM | POA: Diagnosis not present

## 2017-05-19 DIAGNOSIS — I672 Cerebral atherosclerosis: Secondary | ICD-10-CM | POA: Diagnosis not present

## 2017-05-19 DIAGNOSIS — R1310 Dysphagia, unspecified: Secondary | ICD-10-CM | POA: Diagnosis not present

## 2017-05-19 DIAGNOSIS — E1159 Type 2 diabetes mellitus with other circulatory complications: Secondary | ICD-10-CM | POA: Diagnosis not present

## 2017-05-19 DIAGNOSIS — F0151 Vascular dementia with behavioral disturbance: Secondary | ICD-10-CM | POA: Diagnosis not present

## 2017-05-19 DIAGNOSIS — I739 Peripheral vascular disease, unspecified: Secondary | ICD-10-CM | POA: Diagnosis not present

## 2017-05-20 DIAGNOSIS — D638 Anemia in other chronic diseases classified elsewhere: Secondary | ICD-10-CM | POA: Diagnosis not present

## 2017-05-20 DIAGNOSIS — I739 Peripheral vascular disease, unspecified: Secondary | ICD-10-CM | POA: Diagnosis not present

## 2017-05-20 DIAGNOSIS — N401 Enlarged prostate with lower urinary tract symptoms: Secondary | ICD-10-CM | POA: Diagnosis not present

## 2017-05-20 DIAGNOSIS — K219 Gastro-esophageal reflux disease without esophagitis: Secondary | ICD-10-CM | POA: Diagnosis not present

## 2017-05-20 DIAGNOSIS — I679 Cerebrovascular disease, unspecified: Secondary | ICD-10-CM | POA: Diagnosis not present

## 2017-05-20 DIAGNOSIS — R1310 Dysphagia, unspecified: Secondary | ICD-10-CM | POA: Diagnosis not present

## 2017-05-20 DIAGNOSIS — E1159 Type 2 diabetes mellitus with other circulatory complications: Secondary | ICD-10-CM | POA: Diagnosis not present

## 2017-05-20 DIAGNOSIS — I10 Essential (primary) hypertension: Secondary | ICD-10-CM | POA: Diagnosis not present

## 2017-05-20 DIAGNOSIS — I672 Cerebral atherosclerosis: Secondary | ICD-10-CM | POA: Diagnosis not present

## 2017-05-20 DIAGNOSIS — F339 Major depressive disorder, recurrent, unspecified: Secondary | ICD-10-CM | POA: Diagnosis not present

## 2017-05-20 DIAGNOSIS — F0151 Vascular dementia with behavioral disturbance: Secondary | ICD-10-CM | POA: Diagnosis not present

## 2017-05-20 DIAGNOSIS — G20 Parkinson's disease: Secondary | ICD-10-CM | POA: Diagnosis not present

## 2017-05-21 DIAGNOSIS — E1159 Type 2 diabetes mellitus with other circulatory complications: Secondary | ICD-10-CM | POA: Diagnosis not present

## 2017-05-21 DIAGNOSIS — I739 Peripheral vascular disease, unspecified: Secondary | ICD-10-CM | POA: Diagnosis not present

## 2017-05-21 DIAGNOSIS — I672 Cerebral atherosclerosis: Secondary | ICD-10-CM | POA: Diagnosis not present

## 2017-05-21 DIAGNOSIS — R1310 Dysphagia, unspecified: Secondary | ICD-10-CM | POA: Diagnosis not present

## 2017-05-21 DIAGNOSIS — F0151 Vascular dementia with behavioral disturbance: Secondary | ICD-10-CM | POA: Diagnosis not present

## 2017-05-21 DIAGNOSIS — I679 Cerebrovascular disease, unspecified: Secondary | ICD-10-CM | POA: Diagnosis not present

## 2017-05-23 DIAGNOSIS — I672 Cerebral atherosclerosis: Secondary | ICD-10-CM | POA: Diagnosis not present

## 2017-05-23 DIAGNOSIS — F0151 Vascular dementia with behavioral disturbance: Secondary | ICD-10-CM | POA: Diagnosis not present

## 2017-05-23 DIAGNOSIS — I679 Cerebrovascular disease, unspecified: Secondary | ICD-10-CM | POA: Diagnosis not present

## 2017-05-23 DIAGNOSIS — E1159 Type 2 diabetes mellitus with other circulatory complications: Secondary | ICD-10-CM | POA: Diagnosis not present

## 2017-05-23 DIAGNOSIS — I739 Peripheral vascular disease, unspecified: Secondary | ICD-10-CM | POA: Diagnosis not present

## 2017-05-23 DIAGNOSIS — R1310 Dysphagia, unspecified: Secondary | ICD-10-CM | POA: Diagnosis not present

## 2017-05-25 ENCOUNTER — Encounter: Payer: Self-pay | Admitting: Adult Health

## 2017-05-25 ENCOUNTER — Non-Acute Institutional Stay (SKILLED_NURSING_FACILITY): Payer: Medicare Other | Admitting: Adult Health

## 2017-05-25 DIAGNOSIS — I679 Cerebrovascular disease, unspecified: Secondary | ICD-10-CM | POA: Diagnosis not present

## 2017-05-25 DIAGNOSIS — F0150 Vascular dementia without behavioral disturbance: Secondary | ICD-10-CM | POA: Diagnosis not present

## 2017-05-25 DIAGNOSIS — I739 Peripheral vascular disease, unspecified: Secondary | ICD-10-CM | POA: Diagnosis not present

## 2017-05-25 DIAGNOSIS — F39 Unspecified mood [affective] disorder: Secondary | ICD-10-CM

## 2017-05-25 DIAGNOSIS — I672 Cerebral atherosclerosis: Secondary | ICD-10-CM | POA: Diagnosis not present

## 2017-05-25 DIAGNOSIS — I10 Essential (primary) hypertension: Secondary | ICD-10-CM | POA: Diagnosis not present

## 2017-05-25 DIAGNOSIS — F339 Major depressive disorder, recurrent, unspecified: Secondary | ICD-10-CM

## 2017-05-25 DIAGNOSIS — N400 Enlarged prostate without lower urinary tract symptoms: Secondary | ICD-10-CM | POA: Diagnosis not present

## 2017-05-25 DIAGNOSIS — G4700 Insomnia, unspecified: Secondary | ICD-10-CM | POA: Diagnosis not present

## 2017-05-25 DIAGNOSIS — R1310 Dysphagia, unspecified: Secondary | ICD-10-CM | POA: Diagnosis not present

## 2017-05-25 DIAGNOSIS — E1159 Type 2 diabetes mellitus with other circulatory complications: Secondary | ICD-10-CM | POA: Diagnosis not present

## 2017-05-25 DIAGNOSIS — F0151 Vascular dementia with behavioral disturbance: Secondary | ICD-10-CM | POA: Diagnosis not present

## 2017-05-25 NOTE — Progress Notes (Signed)
Location:  [facility] Room Number: 382-N Place of Service:  SNF (31) Provider:  Durenda Age, NP  Patient Care Team: Hendricks Limes, MD as PCP - General (Internal Medicine)  Extended Emergency Contact Information Primary Emergency Contact: Caropreso,Edward  [place] Mobile Phone: [phone] Relation: Relative Secondary Emergency Contact: Bilger,Betsy  [place] Mobile Phone: [phone] Relation: Relative  Code Status:   DNR  Goals of care: Advanced Directive information Advanced Directives 05/02/2017  Does Patient Have a Medical Advance Directive? Yes  Type of Advance Directive Out of facility DNR (pink MOST or yellow form)  Does patient want to make changes to medical advance directive? No - Patient declined  Copy of [place] in Chart? -  Would patient like information on creating a medical advance directive? -  Pre-existing out of facility DNR order (yellow form or pink MOST form) -     Chief Complaint  Patient presents with  . Medical Management of Chronic Issues    Routine [facility] visit    HPI:  Pt is a 84 y.o. male seen today for medical management of chronic diseases.  He is a long-term care resident of [facility].  He is also followed by [facility].  He has a PMH of dementia, HTN, urinary retention, PVD, and chronic leukocytosis. He was seen in his room today. He was in his bed and did not complain of any pain. Later in the afternoon, care plan meeting was attended by the social worker, activity director, NP, dietician, the resident himself, and Stanton Kidney, the daughter. He is being followed-up by hospice and continues to be DNR. Dietician reported that his weight is stable, current weight 186.6 lbs. Sertraline was recently added. It was noted that he is not combative during ADL care.  Activity reported that he enjoyed the visit from the community kids. The meeting  lasted for 20 minutes.    Past Medical History:  Diagnosis Date  . Arthritis   . Carotid artery occlusion   . GERD (gastroesophageal reflux disease)   . Headache    "sometimes monthly" (06/29/2016)  . Heart block   . History of blood transfusion    "while in the service"  . History of stomach ulcers   . Hypertension   . Insomnia   . Pancreatitis   . Prostate cancer ([place])    Archie Endo 09/02/2010  . Prostate enlargement   . PVC's (premature ventricular contractions)   . PVD (peripheral vascular disease) ([place])   . Rhabdomyolysis 06/2016  . Stroke ([place]) 05/2013; 06/29/2016   Archie Endo 05/24/2013  . Type II diabetes mellitus ([place]) dx'd 2008   Archie Endo 08/19/2010   Past Surgical History:  Procedure Laterality Date  . CATARACT EXTRACTION W/ INTRAOCULAR LENS  IMPLANT, BILATERAL Bilateral   . CHOLECYSTECTOMY  07/18/2011   Procedure: LAPAROSCOPIC CHOLECYSTECTOMY;  Surgeon: Gwenyth Ober, MD;  Location: [facility];  Service: General;  Laterality: N/A;  . ENDARTERECTOMY Right 05/28/2013   Procedure: ENDARTERECTOMY CAROTID;  Surgeon: Rosetta Posner, MD;  Location: [facility];  Service: Vascular;  Laterality: Right;  . ENDARTERECTOMY Left 07/09/2016   Procedure: ENDARTERECTOMY CAROTID;  Surgeon: Rosetta Posner, MD;  Location: [facility];  Service: Vascular;  Laterality: Left;  . ESOPHAGOGASTRODUODENOSCOPY (EGD) WITH ESOPHAGEAL DILATION  2004; 12/2009   Archie Endo 01/08/2010  . FLEXIBLE SIGMOIDOSCOPY N/A 09/23/2014   Procedure: FLEXIBLE SIGMOIDOSCOPY;  Surgeon: Garlan Fair, MD;  Location: [facility];  Service: Endoscopy;  Laterality:  N/A;  . INSERTION PROSTATE RADIATION SEED  03/1999   Archie Endo 09/02/2010  . PATCH ANGIOPLASTY Left 07/09/2016   Procedure: PATCH ANGIOPLASTY LEFT CAROTID ARTERY;  Surgeon: Rosetta Posner, MD;  Location: [facility];  Service: Vascular;  Laterality: Left;  . PROSTATE BIOPSY  2000   Archie Endo 09/02/2010  . TONSILLECTOMY      Allergies  Allergen Reactions  . Penicillins Rash    Has patient had a PCN  reaction causing immediate rash, facial/tongue/throat swelling, SOB or lightheadedness with hypotension: No Has patient had a PCN reaction causing severe rash involving mucus membranes or skin necrosis: No Has patient had a PCN reaction that required hospitalization No Has patient had a PCN reaction occurring within the last 10 years: No If all of the above answers are "NO", then may proceed with Cephalosporin use.    Outpatient Encounter Medications as of 05/25/2017  Medication Sig  . acetaminophen (TYLENOL) 325 MG tablet Take 325 mg by mouth every 6 (six) hours as needed for mild pain.   . bisacodyl (DULCOLAX) 10 MG suppository Place 10 mg rectally daily as needed for moderate constipation (for constipation not relieved by milk of magnesium).  . clonazePAM (KLONOPIN) 0.5 MG tablet Take 1 tablet (0.5 mg total) by mouth at bedtime as needed for anxiety.  . divalproex (DEPAKOTE SPRINKLE) 125 MG capsule Take 125-250 mg by mouth 2 (two) times daily. Take 250 mg qam and 125 mg at noon.  . divalproex (DEPAKOTE SPRINKLE) 125 MG capsule Take 250 mg by mouth at bedtime.  . Emollient (CERAVE) LOTN Apply topically daily as needed. May keep at bedside to apply to arms and legs as needed for dry skin  . magnesium hydroxide (MILK OF MAGNESIA) 400 MG/5ML suspension Take 30 mLs by mouth daily as needed (for constipation).  . NUTRITIONAL SUPPLEMENT LIQD Take 120 mLs by mouth daily. MedPass  . sertraline (ZOLOFT) 25 MG tablet Take 25 mg by mouth daily.  . Tamsulosin HCl (FLOMAX) 0.4 MG CAPS Take 0.4 mg by mouth daily at 6 PM.   . [DISCONTINUED] sertraline (ZOLOFT) 25 MG tablet Take 25 mg by mouth daily.   No facility-administered encounter medications on file as of 05/25/2017.     Review of Systems  Unable to obtain due to dementia    Immunization History  Administered Date(s) Administered  . Influenza-Unspecified 11/18/2015, 02/03/2017  . PPD Test 07/26/2016  . Pneumococcal-Unspecified 10/05/1988    Pertinent  Health Maintenance Due  Topic Date Due  . FOOT EXAM  09/16/2017 (Originally 10/05/1931)  . URINE MICROALBUMIN  09/16/2017 (Originally 10/05/1931)  . PNA vac Low Risk Adult (2 of 2 - PCV13) 09/16/2017 (Originally 10/05/1989)  . HEMOGLOBIN A1C  07/11/2017  . OPHTHALMOLOGY EXAM  11/11/2017  . INFLUENZA VACCINE  Completed      Vitals:   05/25/17 1426  BP: 138/77  Pulse: 82  Resp: 20  Temp: (!) 97 F (36.1 C)  TempSrc: Oral  SpO2: 97%  Weight: 186 lb 9.6 oz (84.6 kg)  Height: 5\' 8"  (1.727 m)   Body mass index is 28.37 kg/m.  Physical Exam  GENERAL APPEARANCE: Well nourished. In no acute distress. Normal body habitus SKIN:  Right forearm skin tear covered with dressing MOUTH and THROAT: Lips are without lesions. Oral mucosa is moist and without lesions. Tongue is normal in shape, size, and color and without lesions RESPIRATORY: Breathing is even & unlabored, BS CTAB CARDIAC: RRR, no murmur,no extra heart sounds, no edema GI: Abdomen soft, normal BS,  no masses, no tenderness EXTREMITIES:  Able to move X 4 extremities PSYCHIATRIC: Alert to self, disoriented to time and place. Affect and behavior are appropriate   Labs reviewed: Recent Labs    06/29/16 1413  07/02/16 0852 07/04/16 1546 07/09/16 0632  07/10/16 0306 07/12/16 07/28/16  NA  --    < > 141 142 140   < > 140 142 142  K  --    < > 3.7 3.7 3.9   < > 4.0 4.4 3.9  CL  --    < > 104 105 103  --  105  --   --   CO2  --    < > 26  --  27  --  29  --   --   GLUCOSE  --    < > 108* 100* 101*  --  141*  --   --   BUN  --    < > 6 17 13   --  9 14 29*  CREATININE  --    < > 0.88 1.10 1.13  --  0.94 0.9 0.9  CALCIUM  --    < > 8.8*  --  8.9  --  8.4*  --   --   MG 1.8  --   --   --   --   --   --   --   --    < > = values in this interval not displayed.   Recent Labs    06/29/16 1132 06/30/16 0608  AST 108* 94*  ALT 30 31  ALKPHOS 60 50  BILITOT 1.5* 1.4*  PROT 6.8 5.8*  ALBUMIN 3.7 3.0*   Recent  Labs    06/29/16 1132  06/30/16 0608  07/09/16 0632  07/09/16 1201 07/10/16 0306 07/12/16  WBC 22.0*  --  14.9*   < > 12.8*  --  17.7* 12.7* 16.6  NEUTROABS 16.5*  --  9.5*  --   --   --   --   --   --   HGB 16.3   < > 14.0   < > 14.0   < > 9.7* 9.6* 10.2*  HCT 46.4   < > 40.5   < > 42.0   < > 28.8* 29.1* 30*  MCV 92.1  --  91.4   < > 93.8  --  93.2 94.8  --   PLT 277  --  219   < > 293  --  226 233 267   < > = values in this interval not displayed.   Lab Results  Component Value Date   TSH 3.779 08/05/2015   Lab Results  Component Value Date   HGBA1C 6.7 01/11/2017   Lab Results  Component Value Date   CHOL 147 06/30/2016   HDL 25 (L) 06/30/2016   LDLCALC 99 06/30/2016   TRIG 113 06/30/2016   CHOLHDL 5.9 06/30/2016    Assessment/Plan  1. Essential hypertension - well-controlled, not on any medication   2. Benign prostatic hyperplasia, unspecified whether lower urinary tract symptoms present - continue Flomax 0.4 mg 1 capsule Q evening   3. Insomnia - continue Clonazepam 0.5 mg 1 tab Q HS   4. Mood disorder (HCC) - mood is stable, continue Depakote 125 mg  2 capsules = 250 mg  Q AM, 125 mg Q 12 PM and 250 mg Q HS   5. Depression, recurrent ([place]) - continue Sertraline 25 mg 1 tab daily  6. Vascular dementia without behavioral disturbance - advanced, continue supportive care, comfort care     Family/ staff Communication: Discussed plan of care with the resident, daughter, social worker, MDS coordinator, Glass blower/designer, and dietician.  Labs/tests ordered:  None  Goals of care:  Long-term care/Hospice  Durenda Age, NP [facility] [phone] (Monday-Friday 8:00 a.m. - 5:00 p.m.) [phone] (after hours)

## 2017-05-30 DIAGNOSIS — I672 Cerebral atherosclerosis: Secondary | ICD-10-CM | POA: Diagnosis not present

## 2017-05-30 DIAGNOSIS — I679 Cerebrovascular disease, unspecified: Secondary | ICD-10-CM | POA: Diagnosis not present

## 2017-05-30 DIAGNOSIS — E1159 Type 2 diabetes mellitus with other circulatory complications: Secondary | ICD-10-CM | POA: Diagnosis not present

## 2017-05-30 DIAGNOSIS — I739 Peripheral vascular disease, unspecified: Secondary | ICD-10-CM | POA: Diagnosis not present

## 2017-05-30 DIAGNOSIS — R1310 Dysphagia, unspecified: Secondary | ICD-10-CM | POA: Diagnosis not present

## 2017-05-30 DIAGNOSIS — F0151 Vascular dementia with behavioral disturbance: Secondary | ICD-10-CM | POA: Diagnosis not present

## 2017-06-01 DIAGNOSIS — E1159 Type 2 diabetes mellitus with other circulatory complications: Secondary | ICD-10-CM | POA: Diagnosis not present

## 2017-06-01 DIAGNOSIS — I672 Cerebral atherosclerosis: Secondary | ICD-10-CM | POA: Diagnosis not present

## 2017-06-01 DIAGNOSIS — I679 Cerebrovascular disease, unspecified: Secondary | ICD-10-CM | POA: Diagnosis not present

## 2017-06-01 DIAGNOSIS — F0151 Vascular dementia with behavioral disturbance: Secondary | ICD-10-CM | POA: Diagnosis not present

## 2017-06-01 DIAGNOSIS — R1310 Dysphagia, unspecified: Secondary | ICD-10-CM | POA: Diagnosis not present

## 2017-06-01 DIAGNOSIS — I739 Peripheral vascular disease, unspecified: Secondary | ICD-10-CM | POA: Diagnosis not present

## 2017-06-02 DIAGNOSIS — I672 Cerebral atherosclerosis: Secondary | ICD-10-CM | POA: Diagnosis not present

## 2017-06-02 DIAGNOSIS — I679 Cerebrovascular disease, unspecified: Secondary | ICD-10-CM | POA: Diagnosis not present

## 2017-06-02 DIAGNOSIS — R1310 Dysphagia, unspecified: Secondary | ICD-10-CM | POA: Diagnosis not present

## 2017-06-02 DIAGNOSIS — I739 Peripheral vascular disease, unspecified: Secondary | ICD-10-CM | POA: Diagnosis not present

## 2017-06-02 DIAGNOSIS — E1159 Type 2 diabetes mellitus with other circulatory complications: Secondary | ICD-10-CM | POA: Diagnosis not present

## 2017-06-02 DIAGNOSIS — F0151 Vascular dementia with behavioral disturbance: Secondary | ICD-10-CM | POA: Diagnosis not present

## 2017-06-03 DIAGNOSIS — I672 Cerebral atherosclerosis: Secondary | ICD-10-CM | POA: Diagnosis not present

## 2017-06-03 DIAGNOSIS — F0151 Vascular dementia with behavioral disturbance: Secondary | ICD-10-CM | POA: Diagnosis not present

## 2017-06-03 DIAGNOSIS — R1310 Dysphagia, unspecified: Secondary | ICD-10-CM | POA: Diagnosis not present

## 2017-06-03 DIAGNOSIS — I739 Peripheral vascular disease, unspecified: Secondary | ICD-10-CM | POA: Diagnosis not present

## 2017-06-03 DIAGNOSIS — I679 Cerebrovascular disease, unspecified: Secondary | ICD-10-CM | POA: Diagnosis not present

## 2017-06-03 DIAGNOSIS — E1159 Type 2 diabetes mellitus with other circulatory complications: Secondary | ICD-10-CM | POA: Diagnosis not present

## 2017-06-03 IMAGING — CT CT ANGIO HEAD
1 of 10 series · 1 of 33 positions shown · IV contrast (Iohexol (Omnipaque 350))
Comparison: Brain MRI/MRA 06/29/2016

CLINICAL DATA: Acute ischemia

EXAM:
CT ANGIOGRAPHY HEAD AND NECK
TECHNIQUE: Multidetector CT imaging of the head and neck was performed using
the standard protocol during bolus administration of intravenous
contrast. Multiplanar CT image reconstructions and MIPs were
obtained to evaluate the vascular anatomy. Carotid stenosis
measurements (when applicable) are obtained utilizing NASCET
criteria, using the distal internal carotid diameter as the
denominator.
CONTRAST:  15 mL Isovue 370 IV

[Series 200: locator · axial · 0.49mm/px · 1 of 1 slices shown]
[im 1/1  soft-tissue]
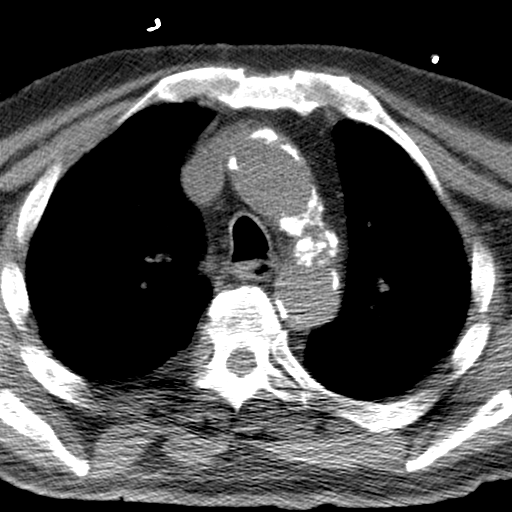

[1 of 33 positions shown; findings below may reference images not displayed]

FINDINGS: CTA NECK FINDINGS

Aortic arch: There is no aneurysm or dissection of the visualized
ascending aorta or aortic arch. There is a normal 3 vessel branching
pattern. There is atherosclerotic calcification within the proximal
subclavian arteries without significant stenosis. Calcific aortic
atherosclerosis.

Right carotid system: There is mixed calcified and noncalcified
atherosclerotic plaque of the common carotid artery resulting in
approximately 50% stenosis. There is noncalcified plaque within the
right internal carotid artery at the C3-C4 level the results in
approximately 50% narrowing of the lumen.

Left carotid system: There is mixed calcified and noncalcified
plaque within the left common carotid artery without greater than
50% stenosis. There is mixed calcified and noncalcified plaque at
the left carotid bifurcation resulting in approximately 80%
stenosis.

Vertebral arteries: There is atherosclerotic calcification at the
vertebral artery origins bilaterally. The left vertebral artery is
occluded proximally in shows no contrast enhancement along its V2
segment. Enhancement of the right V3 and V4 segments of likely
retrograde. There is no hemodynamically significant stenosis of the
dominant left vertebral artery.

Skeleton: There is multilevel uncovertebral and facet hypertrophy
preserved [place] and severe foraminal stenosis at left C3-4, right C4-5
and mild-to-moderate stenosis at the other cervical levels.

Other neck: The nasopharynx is clear. The oropharynx and hypopharynx
are normal. The epiglottis is normal. The supraglottic larynx,
glottis and subglottic larynx are normal. No retropharyngeal
collection. The parapharyngeal spaces are preserved. The parotid and
submandibular glands are normal. No sialolithiasis or salivary
ductal dilatation. The thyroid gland is normal. There is no cervical
lymphadenopathy.

Upper chest: There is biapical emphysema.

Review of the MIP images confirms the above findings

CTA HEAD FINDINGS

Anterior circulation:

--Intracranial internal carotid arteries: There is noncalcified
plaque within the distal petrous segment of the right ICA and mixed
calcified and noncalcified plaque within the cavernous and clinoid
segments. There is approximately 50% luminal narrowing of the
cavernous segment. On the left, there is severe narrowing of the
distal petrous segment. There is mixed calcified and noncalcified
plaque of the cavernous and clinoid segments without advanced
stenosis.

--Anterior cerebral arteries: Normal.

--Middle cerebral arteries: Normal.

--Posterior communicating arteries: Present on the left.

Posterior circulation:

--Posterior cerebral arteries: Fetal origin of the left posterior
cerebral artery. Normal right PCA.

--Superior cerebellar arteries: Normal.

--Basilar artery: Normal.

--Anterior inferior cerebellar arteries: Not visualized, which is
not uncommon.

--Posterior inferior cerebellar arteries: Normal.

Venous sinuses: As permitted by contrast timing, patent.

Anatomic variants: Fetal origin of the left PCA.

Delayed phase: No parenchymal contrast enhancement.

Review of the MIP images confirms the above findings
IMPRESSION: 1. No emergent intracranial large vessel occlusion.
2. Severe stenosis of the proximal left internal carotid artery,
measuring approximately 80% by NASCET criteria.
3. Severe stenosis of the left internal carotid artery distal
petrous segment and approximately 50% stenosis of the cavernous
segment of the left ICA and distal petrous segment of the right ICA.
4. Chronic occlusion of the right vertebral artery.
5. Bilateral common carotid artery stenosis of approximately 50%.
6. Aortic atherosclerosis and biapical emphysema.

## 2017-06-06 DIAGNOSIS — F0151 Vascular dementia with behavioral disturbance: Secondary | ICD-10-CM | POA: Diagnosis not present

## 2017-06-06 DIAGNOSIS — I679 Cerebrovascular disease, unspecified: Secondary | ICD-10-CM | POA: Diagnosis not present

## 2017-06-06 DIAGNOSIS — E1159 Type 2 diabetes mellitus with other circulatory complications: Secondary | ICD-10-CM | POA: Diagnosis not present

## 2017-06-06 DIAGNOSIS — L89612 Pressure ulcer of right heel, stage 2: Secondary | ICD-10-CM | POA: Diagnosis not present

## 2017-06-06 DIAGNOSIS — I739 Peripheral vascular disease, unspecified: Secondary | ICD-10-CM | POA: Diagnosis not present

## 2017-06-06 DIAGNOSIS — R1310 Dysphagia, unspecified: Secondary | ICD-10-CM | POA: Diagnosis not present

## 2017-06-06 DIAGNOSIS — I672 Cerebral atherosclerosis: Secondary | ICD-10-CM | POA: Diagnosis not present

## 2017-06-08 DIAGNOSIS — R1310 Dysphagia, unspecified: Secondary | ICD-10-CM | POA: Diagnosis not present

## 2017-06-08 DIAGNOSIS — E1159 Type 2 diabetes mellitus with other circulatory complications: Secondary | ICD-10-CM | POA: Diagnosis not present

## 2017-06-08 DIAGNOSIS — I672 Cerebral atherosclerosis: Secondary | ICD-10-CM | POA: Diagnosis not present

## 2017-06-08 DIAGNOSIS — I739 Peripheral vascular disease, unspecified: Secondary | ICD-10-CM | POA: Diagnosis not present

## 2017-06-08 DIAGNOSIS — I679 Cerebrovascular disease, unspecified: Secondary | ICD-10-CM | POA: Diagnosis not present

## 2017-06-08 DIAGNOSIS — F0151 Vascular dementia with behavioral disturbance: Secondary | ICD-10-CM | POA: Diagnosis not present

## 2017-06-09 DIAGNOSIS — I672 Cerebral atherosclerosis: Secondary | ICD-10-CM | POA: Diagnosis not present

## 2017-06-09 DIAGNOSIS — F0151 Vascular dementia with behavioral disturbance: Secondary | ICD-10-CM | POA: Diagnosis not present

## 2017-06-09 DIAGNOSIS — E1159 Type 2 diabetes mellitus with other circulatory complications: Secondary | ICD-10-CM | POA: Diagnosis not present

## 2017-06-09 DIAGNOSIS — I739 Peripheral vascular disease, unspecified: Secondary | ICD-10-CM | POA: Diagnosis not present

## 2017-06-09 DIAGNOSIS — R1310 Dysphagia, unspecified: Secondary | ICD-10-CM | POA: Diagnosis not present

## 2017-06-09 DIAGNOSIS — I679 Cerebrovascular disease, unspecified: Secondary | ICD-10-CM | POA: Diagnosis not present

## 2017-06-13 DIAGNOSIS — R1310 Dysphagia, unspecified: Secondary | ICD-10-CM | POA: Diagnosis not present

## 2017-06-13 DIAGNOSIS — I679 Cerebrovascular disease, unspecified: Secondary | ICD-10-CM | POA: Diagnosis not present

## 2017-06-13 DIAGNOSIS — I739 Peripheral vascular disease, unspecified: Secondary | ICD-10-CM | POA: Diagnosis not present

## 2017-06-13 DIAGNOSIS — L89612 Pressure ulcer of right heel, stage 2: Secondary | ICD-10-CM | POA: Diagnosis not present

## 2017-06-13 DIAGNOSIS — F0151 Vascular dementia with behavioral disturbance: Secondary | ICD-10-CM | POA: Diagnosis not present

## 2017-06-13 DIAGNOSIS — E1159 Type 2 diabetes mellitus with other circulatory complications: Secondary | ICD-10-CM | POA: Diagnosis not present

## 2017-06-13 DIAGNOSIS — I672 Cerebral atherosclerosis: Secondary | ICD-10-CM | POA: Diagnosis not present

## 2017-06-15 DIAGNOSIS — R1310 Dysphagia, unspecified: Secondary | ICD-10-CM | POA: Diagnosis not present

## 2017-06-15 DIAGNOSIS — F0151 Vascular dementia with behavioral disturbance: Secondary | ICD-10-CM | POA: Diagnosis not present

## 2017-06-15 DIAGNOSIS — E1159 Type 2 diabetes mellitus with other circulatory complications: Secondary | ICD-10-CM | POA: Diagnosis not present

## 2017-06-15 DIAGNOSIS — I679 Cerebrovascular disease, unspecified: Secondary | ICD-10-CM | POA: Diagnosis not present

## 2017-06-15 DIAGNOSIS — I739 Peripheral vascular disease, unspecified: Secondary | ICD-10-CM | POA: Diagnosis not present

## 2017-06-15 DIAGNOSIS — I672 Cerebral atherosclerosis: Secondary | ICD-10-CM | POA: Diagnosis not present

## 2017-06-16 DIAGNOSIS — I739 Peripheral vascular disease, unspecified: Secondary | ICD-10-CM | POA: Diagnosis not present

## 2017-06-16 DIAGNOSIS — I672 Cerebral atherosclerosis: Secondary | ICD-10-CM | POA: Diagnosis not present

## 2017-06-16 DIAGNOSIS — F0151 Vascular dementia with behavioral disturbance: Secondary | ICD-10-CM | POA: Diagnosis not present

## 2017-06-16 DIAGNOSIS — E1159 Type 2 diabetes mellitus with other circulatory complications: Secondary | ICD-10-CM | POA: Diagnosis not present

## 2017-06-16 DIAGNOSIS — R1310 Dysphagia, unspecified: Secondary | ICD-10-CM | POA: Diagnosis not present

## 2017-06-16 DIAGNOSIS — I679 Cerebrovascular disease, unspecified: Secondary | ICD-10-CM | POA: Diagnosis not present

## 2017-06-17 DIAGNOSIS — I672 Cerebral atherosclerosis: Secondary | ICD-10-CM | POA: Diagnosis not present

## 2017-06-17 DIAGNOSIS — E1159 Type 2 diabetes mellitus with other circulatory complications: Secondary | ICD-10-CM | POA: Diagnosis not present

## 2017-06-17 DIAGNOSIS — K219 Gastro-esophageal reflux disease without esophagitis: Secondary | ICD-10-CM | POA: Diagnosis not present

## 2017-06-17 DIAGNOSIS — L8990 Pressure ulcer of unspecified site, unspecified stage: Secondary | ICD-10-CM | POA: Diagnosis not present

## 2017-06-17 DIAGNOSIS — N401 Enlarged prostate with lower urinary tract symptoms: Secondary | ICD-10-CM | POA: Diagnosis not present

## 2017-06-17 DIAGNOSIS — F339 Major depressive disorder, recurrent, unspecified: Secondary | ICD-10-CM | POA: Diagnosis not present

## 2017-06-17 DIAGNOSIS — D638 Anemia in other chronic diseases classified elsewhere: Secondary | ICD-10-CM | POA: Diagnosis not present

## 2017-06-17 DIAGNOSIS — I679 Cerebrovascular disease, unspecified: Secondary | ICD-10-CM | POA: Diagnosis not present

## 2017-06-17 DIAGNOSIS — I10 Essential (primary) hypertension: Secondary | ICD-10-CM | POA: Diagnosis not present

## 2017-06-17 DIAGNOSIS — R1310 Dysphagia, unspecified: Secondary | ICD-10-CM | POA: Diagnosis not present

## 2017-06-17 DIAGNOSIS — G20 Parkinson's disease: Secondary | ICD-10-CM | POA: Diagnosis not present

## 2017-06-17 DIAGNOSIS — I739 Peripheral vascular disease, unspecified: Secondary | ICD-10-CM | POA: Diagnosis not present

## 2017-06-17 DIAGNOSIS — F0151 Vascular dementia with behavioral disturbance: Secondary | ICD-10-CM | POA: Diagnosis not present

## 2017-06-20 DIAGNOSIS — L89612 Pressure ulcer of right heel, stage 2: Secondary | ICD-10-CM | POA: Diagnosis not present

## 2017-06-20 DIAGNOSIS — F0151 Vascular dementia with behavioral disturbance: Secondary | ICD-10-CM | POA: Diagnosis not present

## 2017-06-20 DIAGNOSIS — I679 Cerebrovascular disease, unspecified: Secondary | ICD-10-CM | POA: Diagnosis not present

## 2017-06-20 DIAGNOSIS — R1310 Dysphagia, unspecified: Secondary | ICD-10-CM | POA: Diagnosis not present

## 2017-06-20 DIAGNOSIS — I672 Cerebral atherosclerosis: Secondary | ICD-10-CM | POA: Diagnosis not present

## 2017-06-20 DIAGNOSIS — E1159 Type 2 diabetes mellitus with other circulatory complications: Secondary | ICD-10-CM | POA: Diagnosis not present

## 2017-06-20 DIAGNOSIS — I739 Peripheral vascular disease, unspecified: Secondary | ICD-10-CM | POA: Diagnosis not present

## 2017-06-21 DIAGNOSIS — E1159 Type 2 diabetes mellitus with other circulatory complications: Secondary | ICD-10-CM | POA: Diagnosis not present

## 2017-06-21 DIAGNOSIS — I679 Cerebrovascular disease, unspecified: Secondary | ICD-10-CM | POA: Diagnosis not present

## 2017-06-21 DIAGNOSIS — I672 Cerebral atherosclerosis: Secondary | ICD-10-CM | POA: Diagnosis not present

## 2017-06-21 DIAGNOSIS — I739 Peripheral vascular disease, unspecified: Secondary | ICD-10-CM | POA: Diagnosis not present

## 2017-06-21 DIAGNOSIS — R1310 Dysphagia, unspecified: Secondary | ICD-10-CM | POA: Diagnosis not present

## 2017-06-21 DIAGNOSIS — F0151 Vascular dementia with behavioral disturbance: Secondary | ICD-10-CM | POA: Diagnosis not present

## 2017-06-22 DIAGNOSIS — R1310 Dysphagia, unspecified: Secondary | ICD-10-CM | POA: Diagnosis not present

## 2017-06-22 DIAGNOSIS — E1159 Type 2 diabetes mellitus with other circulatory complications: Secondary | ICD-10-CM | POA: Diagnosis not present

## 2017-06-22 DIAGNOSIS — F0151 Vascular dementia with behavioral disturbance: Secondary | ICD-10-CM | POA: Diagnosis not present

## 2017-06-22 DIAGNOSIS — I739 Peripheral vascular disease, unspecified: Secondary | ICD-10-CM | POA: Diagnosis not present

## 2017-06-22 DIAGNOSIS — I679 Cerebrovascular disease, unspecified: Secondary | ICD-10-CM | POA: Diagnosis not present

## 2017-06-22 DIAGNOSIS — I672 Cerebral atherosclerosis: Secondary | ICD-10-CM | POA: Diagnosis not present

## 2017-06-23 DIAGNOSIS — E1159 Type 2 diabetes mellitus with other circulatory complications: Secondary | ICD-10-CM | POA: Diagnosis not present

## 2017-06-23 DIAGNOSIS — I672 Cerebral atherosclerosis: Secondary | ICD-10-CM | POA: Diagnosis not present

## 2017-06-23 DIAGNOSIS — I739 Peripheral vascular disease, unspecified: Secondary | ICD-10-CM | POA: Diagnosis not present

## 2017-06-23 DIAGNOSIS — I679 Cerebrovascular disease, unspecified: Secondary | ICD-10-CM | POA: Diagnosis not present

## 2017-06-23 DIAGNOSIS — R1310 Dysphagia, unspecified: Secondary | ICD-10-CM | POA: Diagnosis not present

## 2017-06-23 DIAGNOSIS — F0151 Vascular dementia with behavioral disturbance: Secondary | ICD-10-CM | POA: Diagnosis not present

## 2017-06-24 DIAGNOSIS — E1151 Type 2 diabetes mellitus with diabetic peripheral angiopathy without gangrene: Secondary | ICD-10-CM | POA: Diagnosis not present

## 2017-06-24 DIAGNOSIS — B351 Tinea unguium: Secondary | ICD-10-CM | POA: Diagnosis not present

## 2017-06-24 DIAGNOSIS — R262 Difficulty in walking, not elsewhere classified: Secondary | ICD-10-CM | POA: Diagnosis not present

## 2017-06-24 DIAGNOSIS — L603 Nail dystrophy: Secondary | ICD-10-CM | POA: Diagnosis not present

## 2017-06-24 LAB — HM DIABETES FOOT EXAM

## 2017-06-27 DIAGNOSIS — I739 Peripheral vascular disease, unspecified: Secondary | ICD-10-CM | POA: Diagnosis not present

## 2017-06-27 DIAGNOSIS — E1159 Type 2 diabetes mellitus with other circulatory complications: Secondary | ICD-10-CM | POA: Diagnosis not present

## 2017-06-27 DIAGNOSIS — L89612 Pressure ulcer of right heel, stage 2: Secondary | ICD-10-CM | POA: Diagnosis not present

## 2017-06-27 DIAGNOSIS — F0151 Vascular dementia with behavioral disturbance: Secondary | ICD-10-CM | POA: Diagnosis not present

## 2017-06-27 DIAGNOSIS — R1310 Dysphagia, unspecified: Secondary | ICD-10-CM | POA: Diagnosis not present

## 2017-06-27 DIAGNOSIS — I679 Cerebrovascular disease, unspecified: Secondary | ICD-10-CM | POA: Diagnosis not present

## 2017-06-27 DIAGNOSIS — I672 Cerebral atherosclerosis: Secondary | ICD-10-CM | POA: Diagnosis not present

## 2017-06-29 ENCOUNTER — Other Ambulatory Visit: Payer: Self-pay

## 2017-06-29 DIAGNOSIS — I739 Peripheral vascular disease, unspecified: Secondary | ICD-10-CM | POA: Diagnosis not present

## 2017-06-29 DIAGNOSIS — R1310 Dysphagia, unspecified: Secondary | ICD-10-CM | POA: Diagnosis not present

## 2017-06-29 DIAGNOSIS — I672 Cerebral atherosclerosis: Secondary | ICD-10-CM | POA: Diagnosis not present

## 2017-06-29 DIAGNOSIS — F0151 Vascular dementia with behavioral disturbance: Secondary | ICD-10-CM | POA: Diagnosis not present

## 2017-06-29 DIAGNOSIS — E1159 Type 2 diabetes mellitus with other circulatory complications: Secondary | ICD-10-CM | POA: Diagnosis not present

## 2017-06-29 DIAGNOSIS — I679 Cerebrovascular disease, unspecified: Secondary | ICD-10-CM | POA: Diagnosis not present

## 2017-06-29 MED ORDER — CLONAZEPAM 0.5 MG PO TABS: 0.5000 mg | ORAL_TABLET | Freq: Every evening | ORAL | 0 refills | Status: AC | PRN

## 2017-06-29 NOTE — Telephone Encounter (Signed)
Pharmacy form faxed to [facility] [phone].  Drew diagonal line through form after faxing and put in pharmacy tote on [employer].

## 2017-06-30 ENCOUNTER — Non-Acute Institutional Stay (SKILLED_NURSING_FACILITY): Payer: Medicare Other

## 2017-06-30 DIAGNOSIS — I672 Cerebral atherosclerosis: Secondary | ICD-10-CM | POA: Diagnosis not present

## 2017-06-30 DIAGNOSIS — R1310 Dysphagia, unspecified: Secondary | ICD-10-CM | POA: Diagnosis not present

## 2017-06-30 DIAGNOSIS — I679 Cerebrovascular disease, unspecified: Secondary | ICD-10-CM | POA: Diagnosis not present

## 2017-06-30 DIAGNOSIS — F0151 Vascular dementia with behavioral disturbance: Secondary | ICD-10-CM | POA: Diagnosis not present

## 2017-06-30 DIAGNOSIS — Z0000 Encounter for general adult medical examination without abnormal findings: Secondary | ICD-10-CM

## 2017-06-30 DIAGNOSIS — I739 Peripheral vascular disease, unspecified: Secondary | ICD-10-CM | POA: Diagnosis not present

## 2017-06-30 DIAGNOSIS — E1159 Type 2 diabetes mellitus with other circulatory complications: Secondary | ICD-10-CM | POA: Diagnosis not present

## 2017-06-30 NOTE — Patient Instructions (Signed)
Mr. Zachary [name] , Thank you for taking time to come for your Medicare Wellness Visit. I appreciate your ongoing commitment to your health goals. Please review the following plan we discussed and let me know if I can assist you in the future.   Screening recommendations/referrals: Colonoscopy excluded, pt is over age 82 Recommended yearly ophthalmology/optometry visit for glaucoma screening and checkup Recommended yearly dental visit for hygiene and checkup  Vaccinations: Influenza vaccine up to date Pneumococcal vaccine up to date, completed Tdap vaccine due, not ordered pt is hospice Shingles vaccine due, not ordered pt is hospice    Advanced directives: in chart  Conditions/risks identified: Hospice  Next appointment: Dr. Linna Darner makes rounds  Preventive Care 67 Years and Older, Male Preventive care refers to lifestyle choices and visits with your health care provider that can promote health and wellness. What does preventive care include?  A yearly physical exam. This is also called an annual well check.  Dental exams once or twice a year.  Routine eye exams. Ask your health care provider how often you should have your eyes checked.  Personal lifestyle choices, including:  Daily care of your teeth and gums.  Regular physical activity.  Eating a healthy diet.  Avoiding tobacco and drug use.  Limiting alcohol use.  Practicing safe sex.  Taking low doses of aspirin every day.  Taking vitamin and mineral supplements as recommended by your health care provider. What happens during an annual well check? The services and screenings done by your health care provider during your annual well check will depend on your age, overall health, lifestyle risk factors, and family history of disease. Counseling  Your health care provider may ask you questions about your:  Alcohol use.  Tobacco use.  Drug use.  Emotional well-being.  Home and relationship well-being.  Sexual  activity.  Eating habits.  History of falls.  Memory and ability to understand (cognition).  Work and work Statistician. Screening  You may have the following tests or measurements:  Height, weight, and BMI.  Blood pressure.  Lipid and cholesterol levels. These may be checked every 5 years, or more frequently if you are over 9 years old.  Skin check.  Lung cancer screening. You may have this screening every year starting at age 3 if you have a 30-pack-year history of smoking and currently smoke or have quit within the past 15 years.  Fecal occult blood test (FOBT) of the stool. You may have this test every year starting at age 25.  Flexible sigmoidoscopy or colonoscopy. You may have a sigmoidoscopy every 5 years or a colonoscopy every 10 years starting at age 60.  Prostate cancer screening. Recommendations will vary depending on your family history and other risks.  Hepatitis C blood test.  Hepatitis B blood test.  Sexually transmitted disease (STD) testing.  Diabetes screening. This is done by checking your blood sugar (glucose) after you have not eaten for a while (fasting). You may have this done every 1-3 years.  Abdominal aortic aneurysm (AAA) screening. You may need this if you are a current or former smoker.  Osteoporosis. You may be screened starting at age 77 if you are at high risk. Talk with your health care provider about your test results, treatment options, and if necessary, the need for more tests. Vaccines  Your health care provider may recommend certain vaccines, such as:  Influenza vaccine. This is recommended every year.  Tetanus, diphtheria, and acellular pertussis (Tdap, Td) vaccine. You may need  a Td booster every 10 years.  Zoster vaccine. You may need this after age 37.  Pneumococcal 13-valent conjugate (PCV13) vaccine. One dose is recommended after age 79.  Pneumococcal polysaccharide (PPSV23) vaccine. One dose is recommended after age  33. Talk to your health care provider about which screenings and vaccines you need and how often you need them. This information is not intended to replace advice given to you by your health care provider. Make sure you discuss any questions you have with your health care provider. Document Released: 05/02/2015 Document Revised: 12/24/2015 Document Reviewed: 02/04/2015 Elsevier Interactive Patient Education  2017 [place] Prevention in the Home Falls can cause injuries. They can happen to people of all ages. There are many things you can do to make your home safe and to help prevent falls. What can I do on the outside of my home?  Regularly fix the edges of walkways and driveways and fix any cracks.  Remove anything that might make you trip as you walk through a door, such as a raised step or threshold.  Trim any bushes or trees on the path to your home.  Use bright outdoor lighting.  Clear any walking paths of anything that might make someone trip, such as rocks or tools.  Regularly check to see if handrails are loose or broken. Make sure that both sides of any steps have handrails.  Any raised decks and porches should have guardrails on the edges.  Have any leaves, snow, or ice cleared regularly.  Use sand or salt on walking paths during winter.  Clean up any spills in your garage right away. This includes oil or grease spills. What can I do in the bathroom?  Use night lights.  Install grab bars by the toilet and in the tub and shower. Do not use towel bars as grab bars.  Use non-skid mats or decals in the tub or shower.  If you need to sit down in the shower, use a plastic, non-slip stool.  Keep the floor dry. Clean up any water that spills on the floor as soon as it happens.  Remove soap buildup in the tub or shower regularly.  Attach bath mats securely with double-sided non-slip rug tape.  Do not have throw rugs and other things on the floor that can make  you trip. What can I do in the bedroom?  Use night lights.  Make sure that you have a light by your bed that is easy to reach.  Do not use any sheets or blankets that are too big for your bed. They should not hang down onto the floor.  Have a firm chair that has side arms. You can use this for support while you get dressed.  Do not have throw rugs and other things on the floor that can make you trip. What can I do in the kitchen?  Clean up any spills right away.  Avoid walking on wet floors.  Keep items that you use a lot in easy-to-reach places.  If you need to reach something above you, use a strong step stool that has a grab bar.  Keep electrical cords out of the way.  Do not use floor polish or wax that makes floors slippery. If you must use wax, use non-skid floor wax.  Do not have throw rugs and other things on the floor that can make you trip. What can I do with my stairs?  Do not leave any items on the  stairs.  Make sure that there are handrails on both sides of the stairs and use them. Fix handrails that are broken or loose. Make sure that handrails are as long as the stairways.  Check any carpeting to make sure that it is firmly attached to the stairs. Fix any carpet that is loose or worn.  Avoid having throw rugs at the top or bottom of the stairs. If you do have throw rugs, attach them to the floor with carpet tape.  Make sure that you have a light switch at the top of the stairs and the bottom of the stairs. If you do not have them, ask someone to add them for you. What else can I do to help prevent falls?  Wear shoes that:  Do not have high heels.  Have rubber bottoms.  Are comfortable and fit you well.  Are closed at the toe. Do not wear sandals.  If you use a stepladder:  Make sure that it is fully opened. Do not climb a closed stepladder.  Make sure that both sides of the stepladder are locked into place.  Ask someone to hold it for you, if  possible.  Clearly mark and make sure that you can see:  Any grab bars or handrails.  First and last steps.  Where the edge of each step is.  Use tools that help you move around (mobility aids) if they are needed. These include:  Canes.  Walkers.  Scooters.  Crutches.  Turn on the lights when you go into a dark area. Replace any light bulbs as soon as they burn out.  Set up your furniture so you have a clear path. Avoid moving your furniture around.  If any of your floors are uneven, fix them.  If there are any pets around you, be aware of where they are.  Review your medicines with your doctor. Some medicines can make you feel dizzy. This can increase your chance of falling. Ask your doctor what other things that you can do to help prevent falls. This information is not intended to replace advice given to you by your health care provider. Make sure you discuss any questions you have with your health care provider. Document Released: 01/30/2009 Document Revised: 09/11/2015 Document Reviewed: 05/10/2014 Elsevier Interactive Patient Education  2017 [employer].

## 2017-06-30 NOTE — Progress Notes (Addendum)
Subjective:   Zachary [name] is a 84 y.o. male who presents for Medicare Annual/Subsequent preventive examination at [place]; incapacitated patient unable to answer questions appropriately, hospice patient   Last AWV-06/02/2016    Objective:    Vitals: BP (!) 148/58 (BP Location: Right Arm, Patient Position: Supine)   Pulse 71   Temp 98.1 F (36.7 C) (Oral)   Ht 5\' 8"  (1.727 m)   Wt 187 lb (84.8 kg)   SpO2 94%   BMI 28.43 kg/m   Body mass index is 28.43 kg/m.  Pt had not had BP meds yet  Advanced Directives 06/30/2017 05/25/2017 05/02/2017 04/07/2017 02/04/2017 01/10/2017 12/30/2016  Does Patient Have a Medical Advance Directive? Yes Yes Yes Yes Yes Yes Yes  Type of Advance Directive Out of facility DNR (pink MOST or yellow form) Out of facility DNR (pink MOST or yellow form) Out of facility DNR (pink MOST or yellow form) Out of facility DNR (pink MOST or yellow form) Out of facility DNR (pink MOST or yellow form) Out of facility DNR (pink MOST or yellow form) Out of facility DNR (pink MOST or yellow form)  Does patient want to make changes to medical advance directive? No - Patient declined No - Patient declined No - Patient declined No - Patient declined No - Patient declined No - Patient declined No - Patient declined  Copy of [place] in Chart? - - - - No - copy requested - No - copy requested  Would patient like information on creating a medical advance directive? - - - - - - -  Pre-existing out of facility DNR order (yellow form or pink MOST form) Yellow form placed in chart (order not valid for inpatient use) - - - - - -    Tobacco Social History   Tobacco Use  Smoking Status Former Smoker  . Packs/day: 3.00  . Years: 20.00  . Pack years: 60.00  . Last attempt to quit: 04/19/1962  . Years since quitting: 55.2  Smokeless Tobacco Never Used     Counseling given: Not Answered   Clinical Intake:  Pre-visit preparation completed:  No  Pain : No/denies pain     Nutritional Risks: None Diabetes: No  How often do you need to have someone help you when you read instructions, pamphlets, or other written materials from your doctor or pharmacy?: 3 - Sometimes What is the last grade level you completed in school?: Bachelors  Interpreter Needed?: No  Information entered by :: Tyson Dense, RN  Past Medical History:  Diagnosis Date  . Arthritis   . Carotid artery occlusion   . GERD (gastroesophageal reflux disease)   . Headache    "sometimes monthly" (06/29/2016)  . Heart block   . History of blood transfusion    "while in the service"  . History of stomach ulcers   . Hypertension   . Insomnia   . Pancreatitis   . Prostate cancer ([employer])    Archie Endo 09/02/2010  . Prostate enlargement   . PVC's (premature ventricular contractions)   . PVD (peripheral vascular disease) ([place])   . Rhabdomyolysis 06/2016  . Stroke ([place]) 05/2013; 06/29/2016   Archie Endo 05/24/2013  . Type II diabetes mellitus ([place]) dx'd 2008   Archie Endo 08/19/2010   Past Surgical History:  Procedure Laterality Date  . CATARACT EXTRACTION W/ INTRAOCULAR LENS  IMPLANT, BILATERAL Bilateral   . CHOLECYSTECTOMY  07/18/2011   Procedure: LAPAROSCOPIC CHOLECYSTECTOMY;  Surgeon: Gwenyth Ober,  MD;  Location: [place];  Service: General;  Laterality: N/A;  . ENDARTERECTOMY Right 05/28/2013   Procedure: ENDARTERECTOMY CAROTID;  Surgeon: Rosetta Posner, MD;  Location: [facility];  Service: Vascular;  Laterality: Right;  . ENDARTERECTOMY Left 07/09/2016   Procedure: ENDARTERECTOMY CAROTID;  Surgeon: Rosetta Posner, MD;  Location: [facility];  Service: Vascular;  Laterality: Left;  . ESOPHAGOGASTRODUODENOSCOPY (EGD) WITH ESOPHAGEAL DILATION  2004; 12/2009   Archie Endo 01/08/2010  . FLEXIBLE SIGMOIDOSCOPY N/A 09/23/2014   Procedure: FLEXIBLE SIGMOIDOSCOPY;  Surgeon: Garlan Fair, MD;  [facility];  Service: Endoscopy;  Laterality: N/A;  . INSERTION PROSTATE RADIATION SEED  03/1999    Archie Endo 09/02/2010  . PATCH ANGIOPLASTY Left 07/09/2016   Procedure: PATCH ANGIOPLASTY LEFT CAROTID ARTERY;  Surgeon: Rosetta Posner, MD;  Location: [facility];  Service: Vascular;  Laterality: Left;  . PROSTATE BIOPSY  2000   Archie Endo 09/02/2010  . TONSILLECTOMY     Family History  Problem Relation Age of Onset  . Breast cancer Daughter   . Cancer Daughter 46       died of cancer   . Stroke Father   . Other Unknown        hypogonadism  . Heart attack Neg Hx   . Hypertension Neg Hx    Social History   Socioeconomic History  . Marital status: Married    Spouse name: None  . Number of children: None  . Years of education: None  . Highest education level: None  Social Needs  . Financial resource strain: None  . Food insecurity - worry: None  . Food insecurity - inability: None  . Transportation needs - medical: None  . Transportation needs - non-medical: None  Occupational History  . Occupation: retired    Comment: [employer]  Tobacco Use  . Smoking status: Former Smoker    Packs/day: 3.00    Years: 20.00    Pack years: 60.00    Last attempt to quit: 04/19/1962    Years since quitting: 55.2  . Smokeless tobacco: Never Used  Substance and Sexual Activity  . Alcohol use: No    Alcohol/week: 0.0 oz  . Drug use: No  . Sexual activity: Not Currently  Other Topics Concern  . None  Social History Narrative  . None    Outpatient Encounter Medications as of 06/30/2017  Medication Sig  . acetaminophen (TYLENOL) 325 MG tablet Take 325 mg by mouth every 6 (six) hours as needed for mild pain.   . bisacodyl (DULCOLAX) 10 MG suppository Place 10 mg rectally daily as needed for moderate constipation (for constipation not relieved by milk of magnesium).  . clonazePAM (KLONOPIN) 0.5 MG tablet Take 1 tablet (0.5 mg total) by mouth at bedtime as needed for anxiety.  . divalproex (DEPAKOTE SPRINKLE) 125 MG capsule Take 125-250 mg by mouth 2 (two) times daily. Take 250 mg qam and 125 mg at  noon.  . divalproex (DEPAKOTE SPRINKLE) 125 MG capsule Take 250 mg by mouth at bedtime.  . Emollient (CERAVE) LOTN Apply topically daily as needed. May keep at bedside to apply to arms and legs as needed for dry skin  . magnesium hydroxide (MILK OF MAGNESIA) 400 MG/5ML suspension Take 30 mLs by mouth daily as needed (for constipation).  . NUTRITIONAL SUPPLEMENT LIQD Take 120 mLs by mouth daily. MedPass  . sertraline (ZOLOFT) 25 MG tablet Take 25 mg by mouth daily.  . Tamsulosin HCl (FLOMAX) 0.4 MG CAPS Take 0.4  mg by mouth daily at 6 PM.    No facility-administered encounter medications on file as of 06/30/2017.     Activities of Daily Living In your present state of health, do you have any difficulty performing the following activities: 06/30/2017 07/09/2016  Hearing? N -  Vision? N -  Difficulty concentrating or making decisions? Y -  Walking or climbing stairs? Y -  Dressing or bathing? Y -  Doing errands, shopping? [place]  Preparing Food and eating ? N -  Using the Toilet? N -  In the past six months, have you accidently leaked urine? Y -  Do you have problems with loss of bowel control? Y -  Managing your Medications? Y -  Managing your Finances? Y -  Housekeeping or managing your Housekeeping? Y -  Some recent data might be hidden    Patient Care Team: Hendricks Limes, MD as PCP - General (Internal Medicine)   Assessment:   This is a routine wellness examination for Zachary [name].  Exercise Activities and Dietary recommendations Current Exercise Habits: The patient does not participate in regular exercise at present, Exercise limited by: Other - see comments(hospice)  Goals    None      Fall Risk Fall Risk  06/30/2017  Falls in the past year? Yes  Number falls in past yr: 1  Injury with Fall? No   Is the patient's home free of loose throw rugs in walkways, pet beds, electrical cords, etc?   yes      Grab bars in the bathroom? yes      Handrails on the stairs?   yes       Adequate lighting?   yes  Timed Get Up and Go Performed: Pt unable to perform, she is unabmulatory  Depression Screen PHQ 2/9 Scores 06/30/2017  PHQ - 2 Score 0    Cognitive Function   Montreal Cognitive Assessment  01/07/2014  Visuospatial/ Executive (0/5) 3  Naming (0/3) 2  Attention: Read list of digits (0/2) 2  Attention: Read list of letters (0/1) 1  Attention: Serial 7 subtraction starting at 100 (0/3) 2  Language: Repeat phrase (0/2) 2  Language : Fluency (0/1) 0  Abstraction (0/2) 2  Delayed Recall (0/5) 0  Orientation (0/6) 5  Total 19  Adjusted Score (based on education) 19   6CIT Screen 06/30/2017  What Year? 0 points  What month? 3 points  What time? 0 points  Count back from 20 0 points  Months in reverse 4 points  Repeat phrase 10 points  Total Score 17    Immunization History  Administered Date(s) Administered  . Influenza-Unspecified 11/18/2015, 02/03/2017  . PPD Test 07/26/2016  . Pneumococcal-Unspecified 10/05/1988    Qualifies for Shingles Vaccine? Not in past records  Screening Tests Health Maintenance  Topic Date Due  . FOOT EXAM  09/16/2017 (Originally 10/05/1931)  . URINE MICROALBUMIN  09/16/2017 (Originally 10/05/1931)  . TETANUS/TDAP  09/16/2017 (Originally 10/06/1940)  . PNA vac Low Risk Adult (2 of 2 - PCV13) 09/16/2017 (Originally 10/05/1989)  . HEMOGLOBIN A1C  07/11/2017  . OPHTHALMOLOGY EXAM  11/11/2017  . INFLUENZA VACCINE  Completed   Cancer Screenings: Lung: Low Dose CT Chest recommended if Age 19-80 years, 30 pack-year currently smoking OR have quit w/in 15years. Patient does not qualify. Colorectal: up to date  Additional Screenings:  Hepatitis C Screening: declined TDAP due- not ordered, pt is hospice PNA 13 due-not ordered, pt is hospice    Plan:  I have personally reviewed and addressed the Medicare Annual Wellness questionnaire and have noted the following in the patient's chart:  A. Medical and social history B. Use  of alcohol, tobacco or illicit drugs  C. Current medications and supplements D. Functional ability and status E.  Nutritional status F.  Physical activity G. Advance directives H. List of other physicians I.  Hospitalizations, surgeries, and ER visits in previous 12 months J.  [place] to include hearing, vision, cognitive, depression L. Referrals and appointments - none  In addition, I am unable to review and discuss with incapacitated patient certain preventive protocols, quality metrics, and best practice recommendations. A written personalized care plan for preventive services as well as general preventive health recommendations were provided to patient.   See attached scanned questionnaire for additional information.   Signed,   Tyson Dense, RN Nurse Health Advisor  Patient concerns: none I have personally reviewed the health advisor's clinical note, was available for consultation, and agree with the assessment and plan as written. Hendricks Limes M.D., FACP, [facility]

## 2017-07-04 DIAGNOSIS — R1310 Dysphagia, unspecified: Secondary | ICD-10-CM | POA: Diagnosis not present

## 2017-07-04 DIAGNOSIS — I672 Cerebral atherosclerosis: Secondary | ICD-10-CM | POA: Diagnosis not present

## 2017-07-04 DIAGNOSIS — L89612 Pressure ulcer of right heel, stage 2: Secondary | ICD-10-CM | POA: Diagnosis not present

## 2017-07-04 DIAGNOSIS — I739 Peripheral vascular disease, unspecified: Secondary | ICD-10-CM | POA: Diagnosis not present

## 2017-07-04 DIAGNOSIS — E1159 Type 2 diabetes mellitus with other circulatory complications: Secondary | ICD-10-CM | POA: Diagnosis not present

## 2017-07-04 DIAGNOSIS — I679 Cerebrovascular disease, unspecified: Secondary | ICD-10-CM | POA: Diagnosis not present

## 2017-07-04 DIAGNOSIS — F0151 Vascular dementia with behavioral disturbance: Secondary | ICD-10-CM | POA: Diagnosis not present

## 2017-07-06 DIAGNOSIS — I739 Peripheral vascular disease, unspecified: Secondary | ICD-10-CM | POA: Diagnosis not present

## 2017-07-06 DIAGNOSIS — R1310 Dysphagia, unspecified: Secondary | ICD-10-CM | POA: Diagnosis not present

## 2017-07-06 DIAGNOSIS — I679 Cerebrovascular disease, unspecified: Secondary | ICD-10-CM | POA: Diagnosis not present

## 2017-07-06 DIAGNOSIS — F0151 Vascular dementia with behavioral disturbance: Secondary | ICD-10-CM | POA: Diagnosis not present

## 2017-07-06 DIAGNOSIS — I672 Cerebral atherosclerosis: Secondary | ICD-10-CM | POA: Diagnosis not present

## 2017-07-06 DIAGNOSIS — E1159 Type 2 diabetes mellitus with other circulatory complications: Secondary | ICD-10-CM | POA: Diagnosis not present

## 2017-07-07 DIAGNOSIS — F0151 Vascular dementia with behavioral disturbance: Secondary | ICD-10-CM | POA: Diagnosis not present

## 2017-07-07 DIAGNOSIS — E1159 Type 2 diabetes mellitus with other circulatory complications: Secondary | ICD-10-CM | POA: Diagnosis not present

## 2017-07-07 DIAGNOSIS — I672 Cerebral atherosclerosis: Secondary | ICD-10-CM | POA: Diagnosis not present

## 2017-07-07 DIAGNOSIS — R1310 Dysphagia, unspecified: Secondary | ICD-10-CM | POA: Diagnosis not present

## 2017-07-07 DIAGNOSIS — I739 Peripheral vascular disease, unspecified: Secondary | ICD-10-CM | POA: Diagnosis not present

## 2017-07-07 DIAGNOSIS — I679 Cerebrovascular disease, unspecified: Secondary | ICD-10-CM | POA: Diagnosis not present

## 2017-07-11 DIAGNOSIS — I672 Cerebral atherosclerosis: Secondary | ICD-10-CM | POA: Diagnosis not present

## 2017-07-11 DIAGNOSIS — I679 Cerebrovascular disease, unspecified: Secondary | ICD-10-CM | POA: Diagnosis not present

## 2017-07-11 DIAGNOSIS — F0151 Vascular dementia with behavioral disturbance: Secondary | ICD-10-CM | POA: Diagnosis not present

## 2017-07-11 DIAGNOSIS — L89612 Pressure ulcer of right heel, stage 2: Secondary | ICD-10-CM | POA: Diagnosis not present

## 2017-07-11 DIAGNOSIS — I739 Peripheral vascular disease, unspecified: Secondary | ICD-10-CM | POA: Diagnosis not present

## 2017-07-11 DIAGNOSIS — R1310 Dysphagia, unspecified: Secondary | ICD-10-CM | POA: Diagnosis not present

## 2017-07-11 DIAGNOSIS — E1159 Type 2 diabetes mellitus with other circulatory complications: Secondary | ICD-10-CM | POA: Diagnosis not present

## 2017-07-13 DIAGNOSIS — I739 Peripheral vascular disease, unspecified: Secondary | ICD-10-CM | POA: Diagnosis not present

## 2017-07-13 DIAGNOSIS — I679 Cerebrovascular disease, unspecified: Secondary | ICD-10-CM | POA: Diagnosis not present

## 2017-07-13 DIAGNOSIS — E1159 Type 2 diabetes mellitus with other circulatory complications: Secondary | ICD-10-CM | POA: Diagnosis not present

## 2017-07-13 DIAGNOSIS — F0151 Vascular dementia with behavioral disturbance: Secondary | ICD-10-CM | POA: Diagnosis not present

## 2017-07-13 DIAGNOSIS — I672 Cerebral atherosclerosis: Secondary | ICD-10-CM | POA: Diagnosis not present

## 2017-07-13 DIAGNOSIS — R1310 Dysphagia, unspecified: Secondary | ICD-10-CM | POA: Diagnosis not present

## 2017-07-14 ENCOUNTER — Non-Acute Institutional Stay (SKILLED_NURSING_FACILITY): Payer: Medicare Other | Admitting: Internal Medicine

## 2017-07-14 ENCOUNTER — Encounter: Payer: Self-pay | Admitting: Internal Medicine

## 2017-07-14 DIAGNOSIS — H1033 Unspecified acute conjunctivitis, bilateral: Secondary | ICD-10-CM | POA: Diagnosis not present

## 2017-07-14 DIAGNOSIS — F0151 Vascular dementia with behavioral disturbance: Secondary | ICD-10-CM | POA: Diagnosis not present

## 2017-07-14 DIAGNOSIS — E1159 Type 2 diabetes mellitus with other circulatory complications: Secondary | ICD-10-CM | POA: Diagnosis not present

## 2017-07-14 DIAGNOSIS — I672 Cerebral atherosclerosis: Secondary | ICD-10-CM | POA: Diagnosis not present

## 2017-07-14 DIAGNOSIS — G4700 Insomnia, unspecified: Secondary | ICD-10-CM

## 2017-07-14 DIAGNOSIS — I739 Peripheral vascular disease, unspecified: Secondary | ICD-10-CM | POA: Diagnosis not present

## 2017-07-14 DIAGNOSIS — R1310 Dysphagia, unspecified: Secondary | ICD-10-CM | POA: Diagnosis not present

## 2017-07-14 DIAGNOSIS — I679 Cerebrovascular disease, unspecified: Secondary | ICD-10-CM | POA: Diagnosis not present

## 2017-07-14 NOTE — Assessment & Plan Note (Addendum)
Keep up in W/C as much as tolerated during day Avoid sedating meds during day Psychiatry NP follow up

## 2017-07-14 NOTE — Progress Notes (Signed)
    NURSING HOME LOCATION:  [facility] ROOM NUMBER:  [id number]  CODE STATUS:  DNR  PCP:  Hendricks Limes, MD  [place] [address]  This is a nursing facility follow up of chronic medical diagnoses.   Interim medical record and care since last [place] visit was updated with review of diagnostic studies and change in clinical status since last visit were documented.  HPI: Hospice is following the patient at the SNF where he is a permanent resident. 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 Other diagnoses included essential hypertension, diabetes, and symptomatic prostatism with intermittent urinary retention. He is on tamsulosin for the latter. Psychiatry has been treating him for his dementia without significant behavioral disturbance. The major presentation is sleep disruption with some reversal of sleep pattern.He is on Depakote for mood disorder, clonazepam and low-dose SSRI.Marland Kitchen As he is on hospice, he has no current labs. Wound care is following a heel wound.  Review of systems: Dementia prevented completion. He stated "I don't feel well". He was unable to give any further definition or explanation. He denied specific symptoms such as pain or dyspnea.  Physical exam:  Pertinent or positive findings: When seen at approximately 11:30 AM, he he was asleep in bed. Both eyes are crusted and the R actually crusted shut. Slight purulent discharge is suggested medially at the right eye.Mild erythema of conjunctiva, R > L. The right eye actually looks smaller than the left. The right pupil is larger than the left (anisocoria previously documented). The left pupil is somewhat pinpoint. Lower teeth are markedly eroded. Heart rhythm is slow and irregular. Breath sounds are decreased. Bowel sounds are hyperactive. He is symmetrically weak in all extremities. Pedal pulses are decreased. He has had hyperkeratotic changes of  the dorsum of the hands as well as feet. This is greater over the hands. A right heel wound is dressed.  General appearance: Adequately nourished; no acute distress, increased work of breathing is present.   Lymphatic: No lymphadenopathy about the head, neck, axilla. Eyes: There is no scleral icterus. Ears:  External ear exam shows no significant lesions or deformities.   Nose:  External nasal examination shows no deformity or inflammation. Nasal mucosa are pink and moist without lesions, exudates Oral exam:  Lips and gums are healthy appearing. There is no oropharyngeal erythema or exudate. Neck:  No thyromegaly, masses, tenderness noted.    Heart:  No gallop, murmur, click, rub .  Lungs: without wheezes, rhonchi,rales , rubs. Abdomen: Abdomen is soft and nontender with no organomegaly, hernias,masses. GU: deferred  Extremities:  No cyanosis, clubbing,edema  Skin: Warm & dry  No significant rash.  See summary under each active problem in the Problem List with associated updated therapeutic plan

## 2017-07-15 ENCOUNTER — Encounter: Payer: Self-pay | Admitting: Internal Medicine

## 2017-07-15 NOTE — Patient Instructions (Signed)
See assessment and plan under each diagnosis in the problem list and acutely for this visit 

## 2017-07-18 DIAGNOSIS — F0151 Vascular dementia with behavioral disturbance: Secondary | ICD-10-CM | POA: Diagnosis not present

## 2017-07-18 DIAGNOSIS — N401 Enlarged prostate with lower urinary tract symptoms: Secondary | ICD-10-CM | POA: Diagnosis not present

## 2017-07-18 DIAGNOSIS — L8990 Pressure ulcer of unspecified site, unspecified stage: Secondary | ICD-10-CM | POA: Diagnosis not present

## 2017-07-18 DIAGNOSIS — I679 Cerebrovascular disease, unspecified: Secondary | ICD-10-CM | POA: Diagnosis not present

## 2017-07-18 DIAGNOSIS — F339 Major depressive disorder, recurrent, unspecified: Secondary | ICD-10-CM | POA: Diagnosis not present

## 2017-07-18 DIAGNOSIS — G20 Parkinson's disease: Secondary | ICD-10-CM | POA: Diagnosis not present

## 2017-07-18 DIAGNOSIS — I672 Cerebral atherosclerosis: Secondary | ICD-10-CM | POA: Diagnosis not present

## 2017-07-18 DIAGNOSIS — R1310 Dysphagia, unspecified: Secondary | ICD-10-CM | POA: Diagnosis not present

## 2017-07-18 DIAGNOSIS — E1159 Type 2 diabetes mellitus with other circulatory complications: Secondary | ICD-10-CM | POA: Diagnosis not present

## 2017-07-18 DIAGNOSIS — I739 Peripheral vascular disease, unspecified: Secondary | ICD-10-CM | POA: Diagnosis not present

## 2017-07-18 DIAGNOSIS — D638 Anemia in other chronic diseases classified elsewhere: Secondary | ICD-10-CM | POA: Diagnosis not present

## 2017-07-18 DIAGNOSIS — I10 Essential (primary) hypertension: Secondary | ICD-10-CM | POA: Diagnosis not present

## 2017-07-18 DIAGNOSIS — K219 Gastro-esophageal reflux disease without esophagitis: Secondary | ICD-10-CM | POA: Diagnosis not present

## 2017-07-18 DIAGNOSIS — L89612 Pressure ulcer of right heel, stage 2: Secondary | ICD-10-CM | POA: Diagnosis not present

## 2017-07-20 DIAGNOSIS — F0151 Vascular dementia with behavioral disturbance: Secondary | ICD-10-CM | POA: Diagnosis not present

## 2017-07-20 DIAGNOSIS — R1310 Dysphagia, unspecified: Secondary | ICD-10-CM | POA: Diagnosis not present

## 2017-07-20 DIAGNOSIS — I739 Peripheral vascular disease, unspecified: Secondary | ICD-10-CM | POA: Diagnosis not present

## 2017-07-20 DIAGNOSIS — I672 Cerebral atherosclerosis: Secondary | ICD-10-CM | POA: Diagnosis not present

## 2017-07-20 DIAGNOSIS — E1159 Type 2 diabetes mellitus with other circulatory complications: Secondary | ICD-10-CM | POA: Diagnosis not present

## 2017-07-20 DIAGNOSIS — I679 Cerebrovascular disease, unspecified: Secondary | ICD-10-CM | POA: Diagnosis not present

## 2017-07-21 DIAGNOSIS — R1310 Dysphagia, unspecified: Secondary | ICD-10-CM | POA: Diagnosis not present

## 2017-07-21 DIAGNOSIS — F0151 Vascular dementia with behavioral disturbance: Secondary | ICD-10-CM | POA: Diagnosis not present

## 2017-07-21 DIAGNOSIS — E1159 Type 2 diabetes mellitus with other circulatory complications: Secondary | ICD-10-CM | POA: Diagnosis not present

## 2017-07-21 DIAGNOSIS — I679 Cerebrovascular disease, unspecified: Secondary | ICD-10-CM | POA: Diagnosis not present

## 2017-07-21 DIAGNOSIS — I739 Peripheral vascular disease, unspecified: Secondary | ICD-10-CM | POA: Diagnosis not present

## 2017-07-21 DIAGNOSIS — I672 Cerebral atherosclerosis: Secondary | ICD-10-CM | POA: Diagnosis not present

## 2017-07-25 DIAGNOSIS — L89612 Pressure ulcer of right heel, stage 2: Secondary | ICD-10-CM | POA: Diagnosis not present

## 2017-07-25 DIAGNOSIS — R1310 Dysphagia, unspecified: Secondary | ICD-10-CM | POA: Diagnosis not present

## 2017-07-25 DIAGNOSIS — E1159 Type 2 diabetes mellitus with other circulatory complications: Secondary | ICD-10-CM | POA: Diagnosis not present

## 2017-07-25 DIAGNOSIS — I679 Cerebrovascular disease, unspecified: Secondary | ICD-10-CM | POA: Diagnosis not present

## 2017-07-25 DIAGNOSIS — I672 Cerebral atherosclerosis: Secondary | ICD-10-CM | POA: Diagnosis not present

## 2017-07-25 DIAGNOSIS — I739 Peripheral vascular disease, unspecified: Secondary | ICD-10-CM | POA: Diagnosis not present

## 2017-07-25 DIAGNOSIS — F0151 Vascular dementia with behavioral disturbance: Secondary | ICD-10-CM | POA: Diagnosis not present

## 2017-07-26 DIAGNOSIS — F39 Unspecified mood [affective] disorder: Secondary | ICD-10-CM | POA: Diagnosis not present

## 2017-07-26 DIAGNOSIS — F0150 Vascular dementia without behavioral disturbance: Secondary | ICD-10-CM | POA: Diagnosis not present

## 2017-07-26 DIAGNOSIS — F339 Major depressive disorder, recurrent, unspecified: Secondary | ICD-10-CM | POA: Diagnosis not present

## 2017-07-27 DIAGNOSIS — I739 Peripheral vascular disease, unspecified: Secondary | ICD-10-CM | POA: Diagnosis not present

## 2017-07-27 DIAGNOSIS — E1159 Type 2 diabetes mellitus with other circulatory complications: Secondary | ICD-10-CM | POA: Diagnosis not present

## 2017-07-27 DIAGNOSIS — I672 Cerebral atherosclerosis: Secondary | ICD-10-CM | POA: Diagnosis not present

## 2017-07-27 DIAGNOSIS — R1310 Dysphagia, unspecified: Secondary | ICD-10-CM | POA: Diagnosis not present

## 2017-07-27 DIAGNOSIS — F0151 Vascular dementia with behavioral disturbance: Secondary | ICD-10-CM | POA: Diagnosis not present

## 2017-07-27 DIAGNOSIS — I679 Cerebrovascular disease, unspecified: Secondary | ICD-10-CM | POA: Diagnosis not present

## 2017-07-28 DIAGNOSIS — F0151 Vascular dementia with behavioral disturbance: Secondary | ICD-10-CM | POA: Diagnosis not present

## 2017-07-28 DIAGNOSIS — E1159 Type 2 diabetes mellitus with other circulatory complications: Secondary | ICD-10-CM | POA: Diagnosis not present

## 2017-07-28 DIAGNOSIS — I739 Peripheral vascular disease, unspecified: Secondary | ICD-10-CM | POA: Diagnosis not present

## 2017-07-28 DIAGNOSIS — R1310 Dysphagia, unspecified: Secondary | ICD-10-CM | POA: Diagnosis not present

## 2017-07-28 DIAGNOSIS — I679 Cerebrovascular disease, unspecified: Secondary | ICD-10-CM | POA: Diagnosis not present

## 2017-07-28 DIAGNOSIS — I672 Cerebral atherosclerosis: Secondary | ICD-10-CM | POA: Diagnosis not present

## 2017-07-29 DIAGNOSIS — I672 Cerebral atherosclerosis: Secondary | ICD-10-CM | POA: Diagnosis not present

## 2017-07-29 DIAGNOSIS — E1159 Type 2 diabetes mellitus with other circulatory complications: Secondary | ICD-10-CM | POA: Diagnosis not present

## 2017-07-29 DIAGNOSIS — F0151 Vascular dementia with behavioral disturbance: Secondary | ICD-10-CM | POA: Diagnosis not present

## 2017-07-29 DIAGNOSIS — I679 Cerebrovascular disease, unspecified: Secondary | ICD-10-CM | POA: Diagnosis not present

## 2017-07-29 DIAGNOSIS — R1310 Dysphagia, unspecified: Secondary | ICD-10-CM | POA: Diagnosis not present

## 2017-07-29 DIAGNOSIS — I739 Peripheral vascular disease, unspecified: Secondary | ICD-10-CM | POA: Diagnosis not present

## 2017-08-01 DIAGNOSIS — L89891 Pressure ulcer of other site, stage 1: Secondary | ICD-10-CM | POA: Diagnosis not present

## 2017-08-01 DIAGNOSIS — I672 Cerebral atherosclerosis: Secondary | ICD-10-CM | POA: Diagnosis not present

## 2017-08-01 DIAGNOSIS — I679 Cerebrovascular disease, unspecified: Secondary | ICD-10-CM | POA: Diagnosis not present

## 2017-08-01 DIAGNOSIS — I739 Peripheral vascular disease, unspecified: Secondary | ICD-10-CM | POA: Diagnosis not present

## 2017-08-01 DIAGNOSIS — F0151 Vascular dementia with behavioral disturbance: Secondary | ICD-10-CM | POA: Diagnosis not present

## 2017-08-01 DIAGNOSIS — L89612 Pressure ulcer of right heel, stage 2: Secondary | ICD-10-CM | POA: Diagnosis not present

## 2017-08-01 DIAGNOSIS — R1310 Dysphagia, unspecified: Secondary | ICD-10-CM | POA: Diagnosis not present

## 2017-08-01 DIAGNOSIS — E1159 Type 2 diabetes mellitus with other circulatory complications: Secondary | ICD-10-CM | POA: Diagnosis not present

## 2017-08-02 DIAGNOSIS — F0151 Vascular dementia with behavioral disturbance: Secondary | ICD-10-CM | POA: Diagnosis not present

## 2017-08-02 DIAGNOSIS — I739 Peripheral vascular disease, unspecified: Secondary | ICD-10-CM | POA: Diagnosis not present

## 2017-08-02 DIAGNOSIS — I672 Cerebral atherosclerosis: Secondary | ICD-10-CM | POA: Diagnosis not present

## 2017-08-02 DIAGNOSIS — I679 Cerebrovascular disease, unspecified: Secondary | ICD-10-CM | POA: Diagnosis not present

## 2017-08-02 DIAGNOSIS — E1159 Type 2 diabetes mellitus with other circulatory complications: Secondary | ICD-10-CM | POA: Diagnosis not present

## 2017-08-02 DIAGNOSIS — R1310 Dysphagia, unspecified: Secondary | ICD-10-CM | POA: Diagnosis not present

## 2017-08-03 ENCOUNTER — Non-Acute Institutional Stay (SKILLED_NURSING_FACILITY): Payer: Medicare Other | Admitting: Adult Health

## 2017-08-03 ENCOUNTER — Encounter: Payer: Self-pay | Admitting: Adult Health

## 2017-08-03 DIAGNOSIS — F0151 Vascular dementia with behavioral disturbance: Secondary | ICD-10-CM | POA: Diagnosis not present

## 2017-08-03 DIAGNOSIS — K5901 Slow transit constipation: Secondary | ICD-10-CM | POA: Diagnosis not present

## 2017-08-03 DIAGNOSIS — I679 Cerebrovascular disease, unspecified: Secondary | ICD-10-CM | POA: Diagnosis not present

## 2017-08-03 DIAGNOSIS — F0150 Vascular dementia without behavioral disturbance: Secondary | ICD-10-CM | POA: Diagnosis not present

## 2017-08-03 DIAGNOSIS — G8929 Other chronic pain: Secondary | ICD-10-CM

## 2017-08-03 DIAGNOSIS — F39 Unspecified mood [affective] disorder: Secondary | ICD-10-CM

## 2017-08-03 DIAGNOSIS — I739 Peripheral vascular disease, unspecified: Secondary | ICD-10-CM | POA: Diagnosis not present

## 2017-08-03 DIAGNOSIS — R1310 Dysphagia, unspecified: Secondary | ICD-10-CM | POA: Diagnosis not present

## 2017-08-03 DIAGNOSIS — I672 Cerebral atherosclerosis: Secondary | ICD-10-CM | POA: Diagnosis not present

## 2017-08-03 DIAGNOSIS — E1159 Type 2 diabetes mellitus with other circulatory complications: Secondary | ICD-10-CM | POA: Diagnosis not present

## 2017-08-03 NOTE — Progress Notes (Signed)
Location:  [facility] Room Number: 382-N Place of Service:  SNF (31) Provider:  Durenda Age, NP  Patient Care Team: Hendricks Limes, MD as PCP - General (Internal Medicine)  Extended Emergency Contact Information Primary Emergency Contact: Caropreso,Edward  [place] Mobile Phone: [phone] Relation: Relative Secondary Emergency Contact: Bilger,Betsy  [place] Mobile Phone: [phone] Relation: Relative  Code Status:  DNR  Goals of care: Advanced Directive information Advanced Directives 08/03/2017  Does Patient Have a Medical Advance Directive? Yes  Type of Advance Directive Out of facility DNR (pink MOST or yellow form)  Does patient want to make changes to medical advance directive? No - Patient declined  Copy of [place] in Chart? -  Would patient like information on creating a medical advance directive? -  Pre-existing out of facility DNR order (yellow form or pink MOST form) -     Chief Complaint  Patient presents with  . Acute Visit    Patient seen for pain management    HPI:  Pt is a 84 y.o. male seen today for an acute visit for pain management.  Patient is a long-term care resident [facility].  He is also being followed by [facility].  He has a PMH of dementia, HTN, urinary retention, PVD, and chronic leukocytosis. He has been screaming in pain whenever he gets moved. Roxanol SL was started yesterday. He was seen in the room with daughter at bedside. When asked if he is in pain, he responded,"No." Daughter verbalized that resident has not had BM X 5 days.    Past Medical History:  Diagnosis Date  . Arthritis   . Carotid artery occlusion   . GERD (gastroesophageal reflux disease)   . Headache    "sometimes monthly" (06/29/2016)  . Heart block   . History of blood transfusion    "while in the service"  . History of stomach ulcers   . Hypertension     . Insomnia   . Pancreatitis   . Prostate cancer ([place])    Archie Endo 09/02/2010  . Prostate enlargement   . PVC's (premature ventricular contractions)   . PVD (peripheral vascular disease) ([place])   . Rhabdomyolysis 06/2016  . Stroke ([place]) 05/2013; 06/29/2016   Archie Endo 05/24/2013  . Type II diabetes mellitus ([place]) dx'd 2008   Archie Endo 08/19/2010   Past Surgical History:  Procedure Laterality Date  . CATARACT EXTRACTION W/ INTRAOCULAR LENS  IMPLANT, BILATERAL Bilateral   . CHOLECYSTECTOMY  07/18/2011   Procedure: LAPAROSCOPIC CHOLECYSTECTOMY;  Surgeon: Gwenyth Ober, MD;  Location: [facility];  Service: General;  Laterality: N/A;  . ENDARTERECTOMY Right 05/28/2013   Procedure: ENDARTERECTOMY CAROTID;  Surgeon: Rosetta Posner, MD;  Location: [facility];  Service: Vascular;  Laterality: Right;  . ENDARTERECTOMY Left 07/09/2016   Procedure: ENDARTERECTOMY CAROTID;  Surgeon: Rosetta Posner, MD;  Location: [facility];  Service: Vascular;  Laterality: Left;  . ESOPHAGOGASTRODUODENOSCOPY (EGD) WITH ESOPHAGEAL DILATION  2004; 12/2009   Archie Endo 01/08/2010  . FLEXIBLE SIGMOIDOSCOPY N/A 09/23/2014   Procedure: FLEXIBLE SIGMOIDOSCOPY;  Surgeon: Garlan Fair, MD;  [facility];  Service: Endoscopy;  Laterality: N/A;  . INSERTION PROSTATE RADIATION SEED  03/1999   Archie Endo 09/02/2010  . PATCH ANGIOPLASTY Left 07/09/2016   Procedure: PATCH ANGIOPLASTY LEFT CAROTID ARTERY;  Surgeon: Rosetta Posner, MD;  Location: [facility];  Service: Vascular;  Laterality: Left;  . PROSTATE BIOPSY  2000   Archie Endo 09/02/2010  .  TONSILLECTOMY      Allergies  Allergen Reactions  . Penicillins Rash    Has patient had a PCN reaction causing immediate rash, facial/tongue/throat swelling, SOB or lightheadedness with hypotension: No Has patient had a PCN reaction causing severe rash involving mucus membranes or skin necrosis: No Has patient had a PCN reaction that required hospitalization No Has patient had a PCN reaction occurring within the last 10 years:  No If all of the above answers are "NO", then may proceed with Cephalosporin use.    Outpatient Encounter Medications as of 08/03/2017  Medication Sig  . acetaminophen (TYLENOL) 325 MG tablet Take 325 mg by mouth every 6 (six) hours as needed for mild pain.   Marland Kitchen acetaminophen (TYLENOL) 325 MG tablet Take 650 mg by mouth at bedtime.  . bisacodyl (DULCOLAX) 10 MG suppository Place 10 mg rectally daily as needed for moderate constipation (for constipation not relieved by milk of magnesium).  . clonazePAM (KLONOPIN) 0.5 MG tablet Take 1 tablet (0.5 mg total) by mouth at bedtime as needed for anxiety.  . divalproex (DEPAKOTE SPRINKLE) 125 MG capsule Take 125-250 mg by mouth 2 (two) times daily. Take 250 mg qam and 125 mg at noon.  . divalproex (DEPAKOTE SPRINKLE) 125 MG capsule Take 250 mg by mouth at bedtime.  . Emollient (CERAVE) LOTN Apply topically daily as needed. May keep at bedside to apply to arms and legs as needed for dry skin  . Hypromellose (NATURAL BALANCE TEARS OP) Apply 1 drop to eye 3 (three) times daily. Instill in each eye each shift for dry eyes  . magnesium hydroxide (MILK OF MAGNESIA) 400 MG/5ML suspension Take 30 mLs by mouth daily as needed (for constipation).  . Menthol, Topical Analgesic, (BIOFREEZE) 4 % GEL Apply 1 application topically 3 (three) times daily.  Marland Kitchen morphine (ROXANOL) 20 MG/ML concentrated solution Take 5 mg by mouth every 4 (four) hours as needed for severe pain.  Marland Kitchen morphine (ROXANOL) 20 MG/ML concentrated solution Take 5 mg by mouth 3 (three) times daily. Give scheduled at 6A, 2P, 10P, in addition to the PRN dosing  . NUTRITIONAL SUPPLEMENT LIQD Take 120 mLs by mouth 2 (two) times daily. MedPass  . senna-docusate (SENOKOT-S) 8.6-50 MG tablet Take 2 tablets by mouth at bedtime.  . sertraline (ZOLOFT) 25 MG tablet Take 25 mg by mouth daily.  . Tamsulosin HCl (FLOMAX) 0.4 MG CAPS Take 0.4 mg by mouth daily at 6 PM.   . [DISCONTINUED] erythromycin ophthalmic  ointment Place 1 application into both eyes 4 (four) times daily. Apply after washing eyes with washcloth   No facility-administered encounter medications on file as of 08/03/2017.     Review of Systems  Unable to obtain due to dementia    Immunization History  Administered Date(s) Administered  . Influenza-Unspecified 11/18/2015, 02/03/2017  . PPD Test 07/26/2016  . Pneumococcal-Unspecified 10/05/1988   Pertinent  Health Maintenance Due  Topic Date Due  . URINE MICROALBUMIN  09/16/2017 (Originally 10/05/1931)  . PNA vac Low Risk Adult (2 of 2 - PCV13) 09/16/2017 (Originally 10/05/1989)  . OPHTHALMOLOGY EXAM  11/11/2017  . INFLUENZA VACCINE  11/17/2017  . FOOT EXAM  06/25/2018  . HEMOGLOBIN A1C  Discontinued   Fall Risk  06/30/2017  Falls in the past year? Yes  Number falls in past yr: 1  Injury with Fall? No      Vitals:   08/03/17 0940  BP: 128/74  Pulse: 75  Resp: 20  Temp: 97.7 F (36.5 C)  TempSrc: Oral  SpO2: 94%  Weight: 175 lb 3.2 oz (79.5 kg)  Height: 5\' 8"  (1.727 m)   Body mass index is 26.64 kg/m.  Physical Exam  GENERAL APPEARANCE: Well nourished. In no acute distress. Normal body habitus SKIN:  Pressure ulcer on right heel with dressing and  Heel protectors MOUTH and THROAT: Lips are without lesions. Oral mucosa is moist and without lesions. Tongue is normal in shape, size, and color and without lesions RESPIRATORY: Breathing is even & unlabored, BS CTAB CARDIAC: RRR, no murmur,no extra heart sounds, no edema GI: Abdomen soft, normal BS, no masses, no tenderness EXTREMITIES:  Able to move X 4 extremities, BLE generalized weakness PSYCHIATRIC: Alert and oriented X 3. Affect and behavior are appropriate   Lab Results  Component Value Date   TSH 3.779 08/05/2015   Lab Results  Component Value Date   HGBA1C 6.7 01/11/2017   Lab Results  Component Value Date   CHOL 147 06/30/2016   HDL 25 (L) 06/30/2016   LDLCALC 99 06/30/2016   TRIG 113  06/30/2016   CHOLHDL 5.9 06/30/2016    Assessment/Plan  1. Other chronic pain - continue Morphine 20 mg/ml give 5 mg=0.25 ml SL Q 6AM, 2PM, and 10 PM and 69m=0.25 ml SL Q 4 hour PRN   2. Slow transit constipation - will increase Senna-S 8.6-50 mg 2 tabs Q HS to BID   3. Mood disorder (HCC) - continue Divalproex DR 125 mg 2 capsules = 250 mg daily   4. Vascular dementia without behavioral disturbance - for comfort care/hospice, continue supportive care and Morphine Sulfate 20 mg/ml give 5mg /0.25 ml SL Q 6AM, 2PM and 10PM, and Q 4 hours PRN    Family/ staff Communication: Discussed plan of care with daughter.  Labs/tests ordered:  None  Goals of care:   Long-term care/Hospice   Durenda Age, NP [facility] and Adult Medicine [phone] (Monday-Friday 8:00 a.m. - 5:00 p.m.) [phone] (after hours)

## 2017-08-04 DIAGNOSIS — R1310 Dysphagia, unspecified: Secondary | ICD-10-CM | POA: Diagnosis not present

## 2017-08-04 DIAGNOSIS — I672 Cerebral atherosclerosis: Secondary | ICD-10-CM | POA: Diagnosis not present

## 2017-08-04 DIAGNOSIS — I679 Cerebrovascular disease, unspecified: Secondary | ICD-10-CM | POA: Diagnosis not present

## 2017-08-04 DIAGNOSIS — E1159 Type 2 diabetes mellitus with other circulatory complications: Secondary | ICD-10-CM | POA: Diagnosis not present

## 2017-08-04 DIAGNOSIS — I739 Peripheral vascular disease, unspecified: Secondary | ICD-10-CM | POA: Diagnosis not present

## 2017-08-04 DIAGNOSIS — F0151 Vascular dementia with behavioral disturbance: Secondary | ICD-10-CM | POA: Diagnosis not present

## 2017-08-06 DIAGNOSIS — I672 Cerebral atherosclerosis: Secondary | ICD-10-CM | POA: Diagnosis not present

## 2017-08-06 DIAGNOSIS — E1159 Type 2 diabetes mellitus with other circulatory complications: Secondary | ICD-10-CM | POA: Diagnosis not present

## 2017-08-06 DIAGNOSIS — F0151 Vascular dementia with behavioral disturbance: Secondary | ICD-10-CM | POA: Diagnosis not present

## 2017-08-06 DIAGNOSIS — I739 Peripheral vascular disease, unspecified: Secondary | ICD-10-CM | POA: Diagnosis not present

## 2017-08-06 DIAGNOSIS — I679 Cerebrovascular disease, unspecified: Secondary | ICD-10-CM | POA: Diagnosis not present

## 2017-08-06 DIAGNOSIS — R1310 Dysphagia, unspecified: Secondary | ICD-10-CM | POA: Diagnosis not present

## 2017-08-07 DIAGNOSIS — I739 Peripheral vascular disease, unspecified: Secondary | ICD-10-CM | POA: Diagnosis not present

## 2017-08-07 DIAGNOSIS — E1159 Type 2 diabetes mellitus with other circulatory complications: Secondary | ICD-10-CM | POA: Diagnosis not present

## 2017-08-07 DIAGNOSIS — I672 Cerebral atherosclerosis: Secondary | ICD-10-CM | POA: Diagnosis not present

## 2017-08-07 DIAGNOSIS — R1310 Dysphagia, unspecified: Secondary | ICD-10-CM | POA: Diagnosis not present

## 2017-08-07 DIAGNOSIS — I679 Cerebrovascular disease, unspecified: Secondary | ICD-10-CM | POA: Diagnosis not present

## 2017-08-07 DIAGNOSIS — F0151 Vascular dementia with behavioral disturbance: Secondary | ICD-10-CM | POA: Diagnosis not present

## 2017-08-08 DIAGNOSIS — R1310 Dysphagia, unspecified: Secondary | ICD-10-CM | POA: Diagnosis not present

## 2017-08-08 DIAGNOSIS — I739 Peripheral vascular disease, unspecified: Secondary | ICD-10-CM | POA: Diagnosis not present

## 2017-08-08 DIAGNOSIS — F0151 Vascular dementia with behavioral disturbance: Secondary | ICD-10-CM | POA: Diagnosis not present

## 2017-08-08 DIAGNOSIS — I679 Cerebrovascular disease, unspecified: Secondary | ICD-10-CM | POA: Diagnosis not present

## 2017-08-08 DIAGNOSIS — E1159 Type 2 diabetes mellitus with other circulatory complications: Secondary | ICD-10-CM | POA: Diagnosis not present

## 2017-08-08 DIAGNOSIS — I672 Cerebral atherosclerosis: Secondary | ICD-10-CM | POA: Diagnosis not present

## 2017-08-09 DIAGNOSIS — E1159 Type 2 diabetes mellitus with other circulatory complications: Secondary | ICD-10-CM | POA: Diagnosis not present

## 2017-08-09 DIAGNOSIS — F0151 Vascular dementia with behavioral disturbance: Secondary | ICD-10-CM | POA: Diagnosis not present

## 2017-08-09 DIAGNOSIS — I672 Cerebral atherosclerosis: Secondary | ICD-10-CM | POA: Diagnosis not present

## 2017-08-09 DIAGNOSIS — I679 Cerebrovascular disease, unspecified: Secondary | ICD-10-CM | POA: Diagnosis not present

## 2017-08-09 DIAGNOSIS — I739 Peripheral vascular disease, unspecified: Secondary | ICD-10-CM | POA: Diagnosis not present

## 2017-08-09 DIAGNOSIS — R1310 Dysphagia, unspecified: Secondary | ICD-10-CM | POA: Diagnosis not present

## 2017-08-17 DEATH — deceased
